# Patient Record
Sex: Female | Born: 1985 | Race: Black or African American | Hispanic: No | Marital: Married | Smoking: Current every day smoker
Health system: Southern US, Community
[De-identification: ages and names within clinical notes are randomized; demographics above are authoritative.]

## PROBLEM LIST (undated history)

## (undated) DIAGNOSIS — I517 Cardiomegaly: Secondary | ICD-10-CM

## (undated) DIAGNOSIS — R000 Tachycardia, unspecified: Secondary | ICD-10-CM

## (undated) DIAGNOSIS — N870 Mild cervical dysplasia: Secondary | ICD-10-CM

## (undated) DIAGNOSIS — N200 Calculus of kidney: Secondary | ICD-10-CM

## (undated) DIAGNOSIS — G4733 Obstructive sleep apnea (adult) (pediatric): Secondary | ICD-10-CM

## (undated) DIAGNOSIS — F419 Anxiety disorder, unspecified: Secondary | ICD-10-CM

## (undated) DIAGNOSIS — F319 Bipolar disorder, unspecified: Secondary | ICD-10-CM

## (undated) DIAGNOSIS — E119 Type 2 diabetes mellitus without complications: Secondary | ICD-10-CM

## (undated) DIAGNOSIS — I509 Heart failure, unspecified: Secondary | ICD-10-CM

## (undated) HISTORY — DX: Calculus of kidney: N20.0

## (undated) HISTORY — DX: Obstructive sleep apnea (adult) (pediatric): G47.33

## (undated) HISTORY — DX: Bipolar disorder, unspecified: F31.9

## (undated) HISTORY — DX: Mild cervical dysplasia: N87.0

## (undated) HISTORY — DX: Tachycardia, unspecified: R00.0

## (undated) HISTORY — DX: Anxiety disorder, unspecified: F41.9

---
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
## 2007-01-07 ENCOUNTER — Emergency Department (HOSPITAL_COMMUNITY): Admission: EM | Admit: 2007-01-07 | Discharge: 2007-01-07 | Payer: Self-pay | Admitting: Emergency Medicine

## 2007-01-07 IMAGING — CR DG CHEST 2V
2 series · 2 of 2 positions shown · non-contrast
Comparison: [DATE].

CLINICAL DATA: Chest pain for 1 week with nausea and vomiting.  
 CHEST - 2 VIEW ? [DATE]:

[view not recorded (1 of 2)]
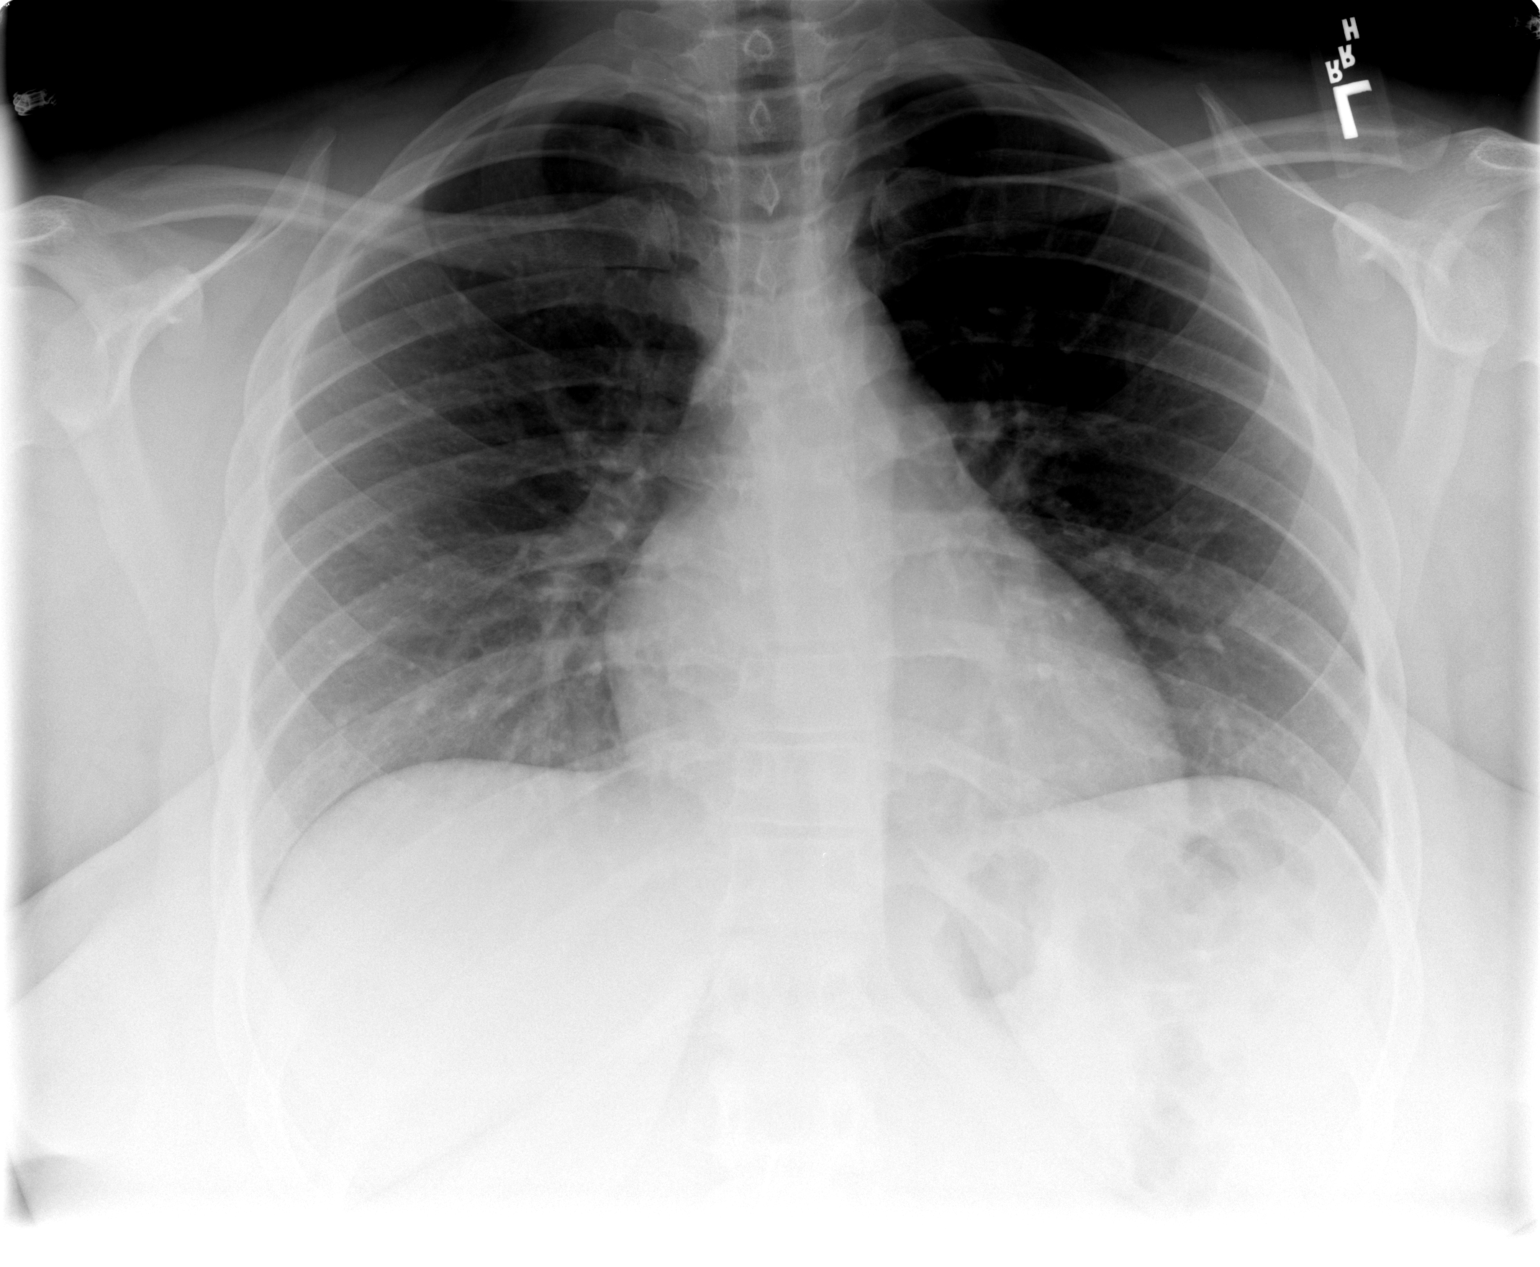

[view not recorded (2 of 2)]
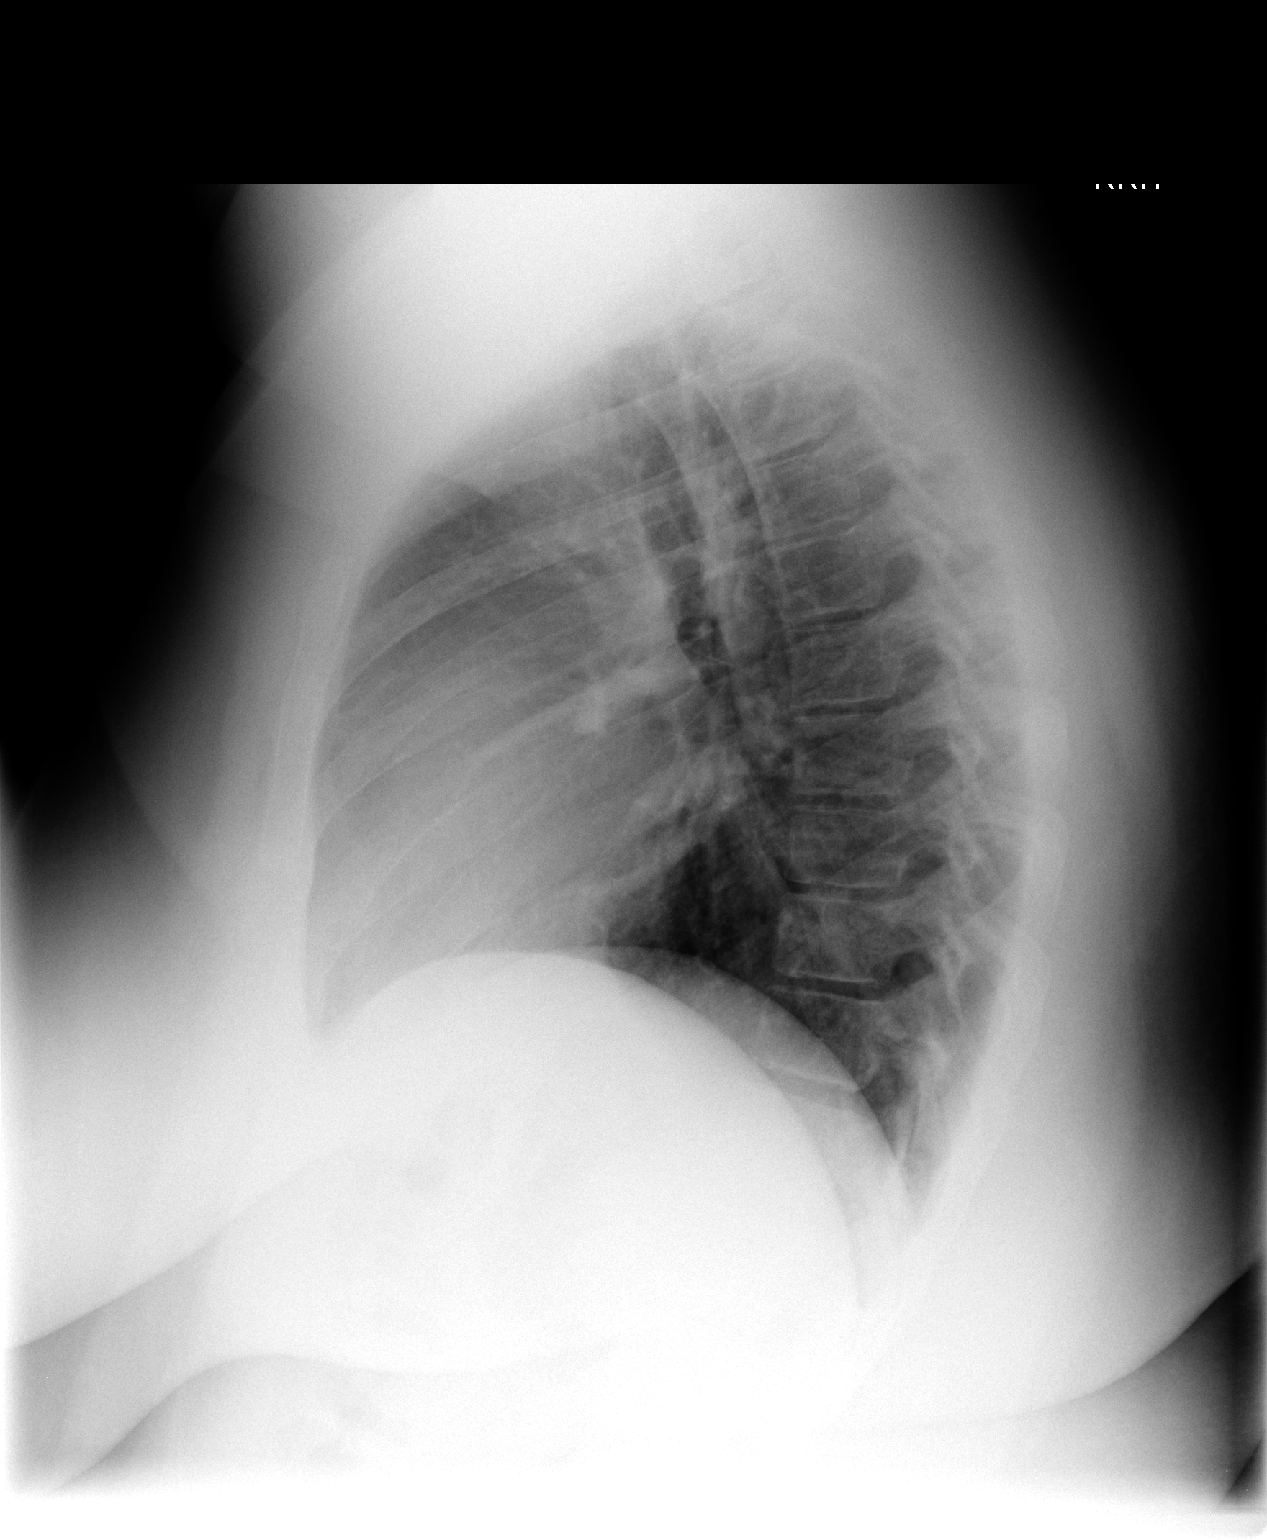

[2 of 2 positions shown; findings below may reference images not displayed]

FINDINGS: The trachea is midline.  The heart size is normal.  The lungs are clear.   No pleural fluid.
IMPRESSION: No acute findings.

## 2007-03-24 ENCOUNTER — Emergency Department (HOSPITAL_COMMUNITY): Admission: EM | Admit: 2007-03-24 | Discharge: 2007-03-24 | Payer: Self-pay | Admitting: Emergency Medicine

## 2007-06-06 ENCOUNTER — Emergency Department (HOSPITAL_COMMUNITY): Admission: EM | Admit: 2007-06-06 | Discharge: 2007-06-06 | Payer: Self-pay | Admitting: Emergency Medicine

## 2008-03-07 ENCOUNTER — Emergency Department (HOSPITAL_COMMUNITY): Admission: EM | Admit: 2008-03-07 | Discharge: 2008-03-07 | Payer: Self-pay | Admitting: Emergency Medicine

## 2008-03-07 IMAGING — CT CT ABDOMEN W/O CM
1 of 2 series · 14 of 32 positions shown, 18 images · non-contrast
Comparison: [DATE].

CT ABDOMEN

CLINICAL DATA: 21-year-old female with right flank pain, dysuria
and vomiting.

CT ABDOMEN AND PELVIS WITHOUT CONTRAST
TECHNIQUE: Multidetector CT imaging of the abdomen and pelvis was
performed following the standard
protocol without intravenous contrast.

[Series 2: stone over (id) 5.0 b40f · axial · 0.85mm/px · z∈[-426,+14]mm · 14 of 98 slices shown, 18 images]
[im 5/98  soft-tissue]
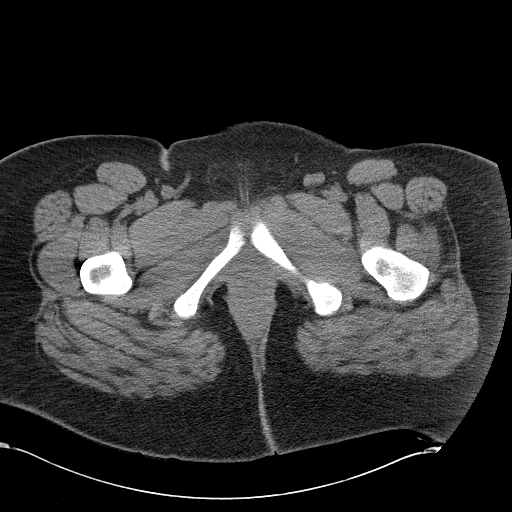
[im 5/98  bone]
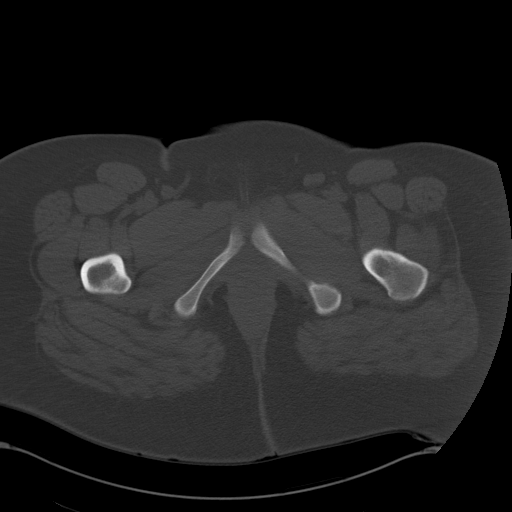
[im 13/98  soft-tissue]
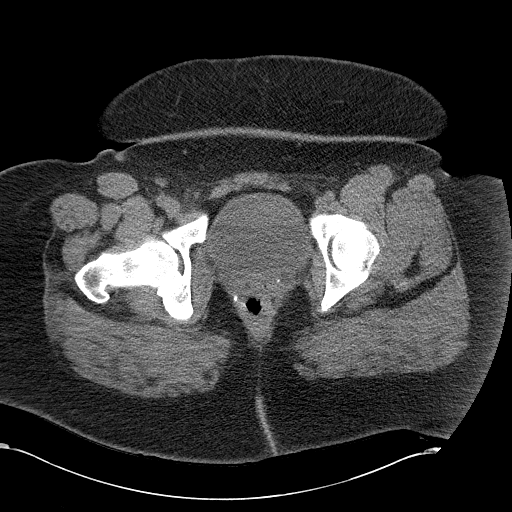
[im 21/98  soft-tissue]
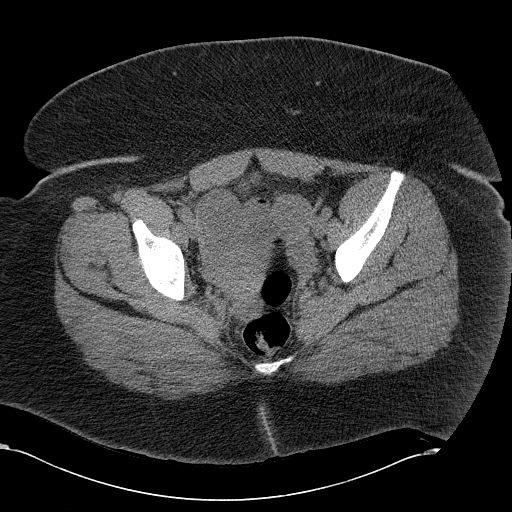
[im 29/98  soft-tissue]
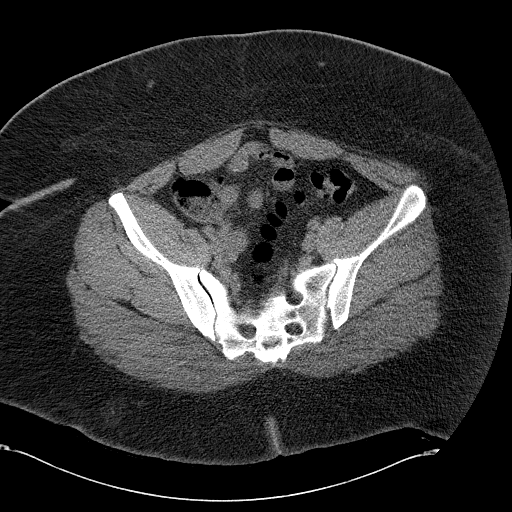
[im 37/98  soft-tissue]
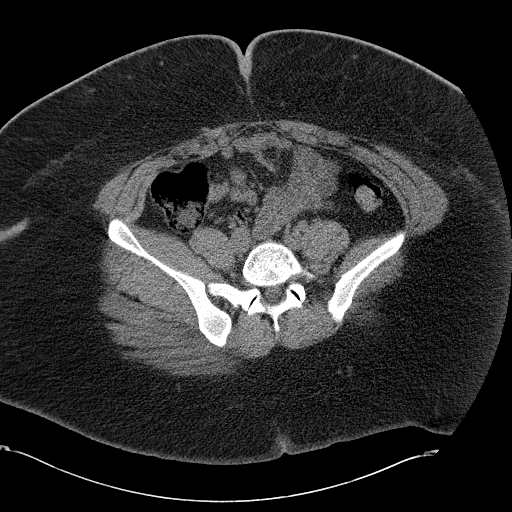
[im 45/98  soft-tissue]
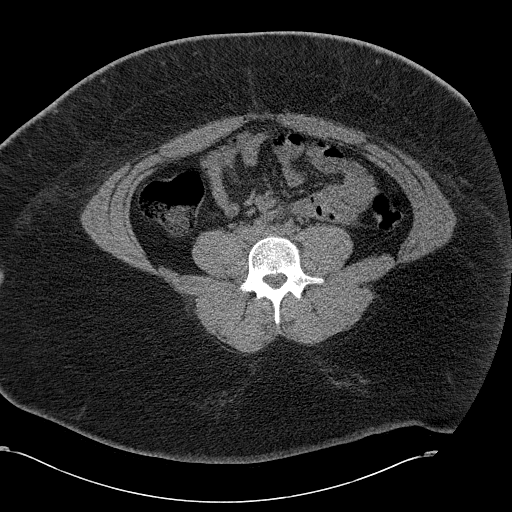
[im 53/98  soft-tissue]
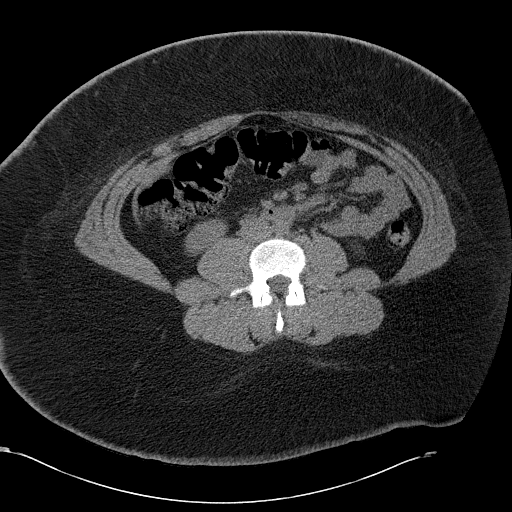
[im 61/98  soft-tissue]
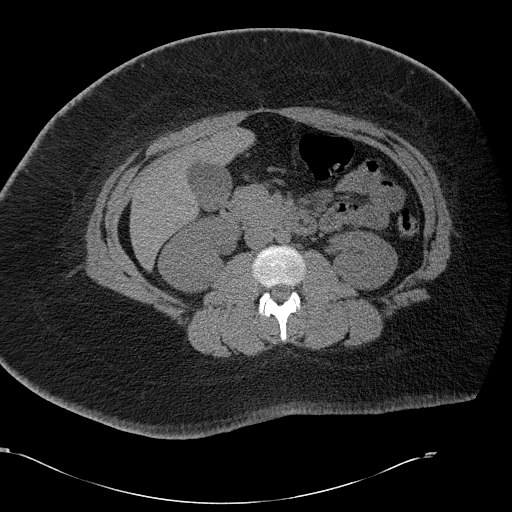
[im 69/98  soft-tissue]
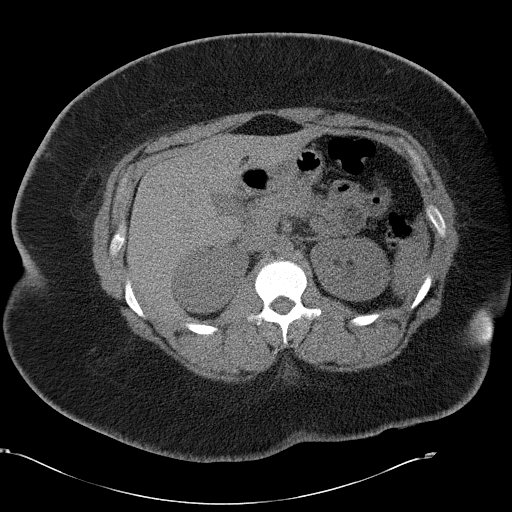
[im 69/98  bone]
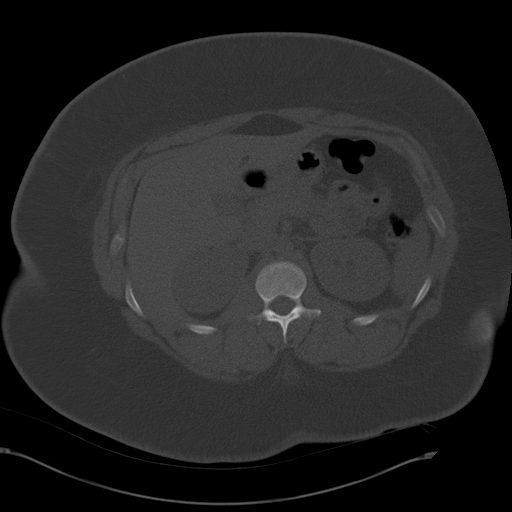
[im 77/98  soft-tissue]
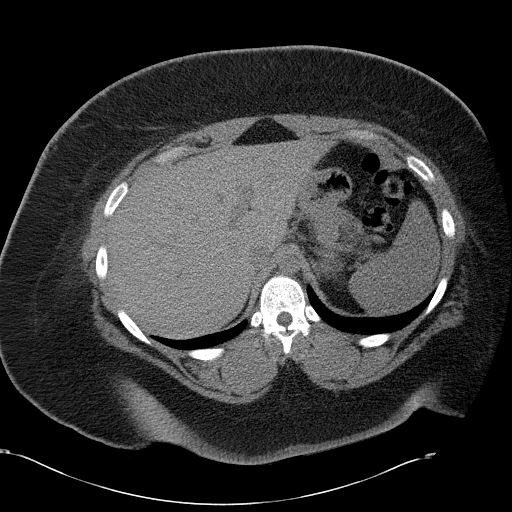
[im 81/98  lung]
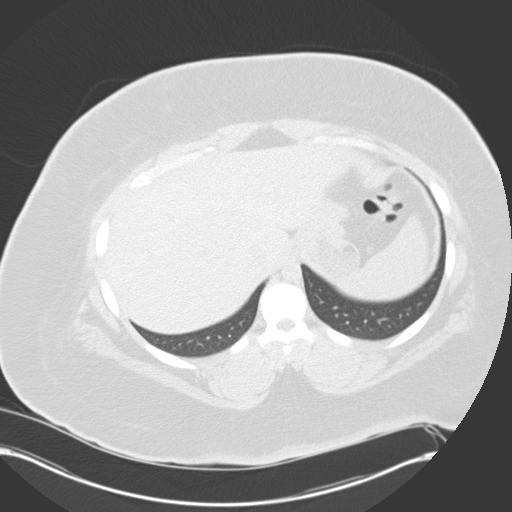
[im 85/98  soft-tissue]
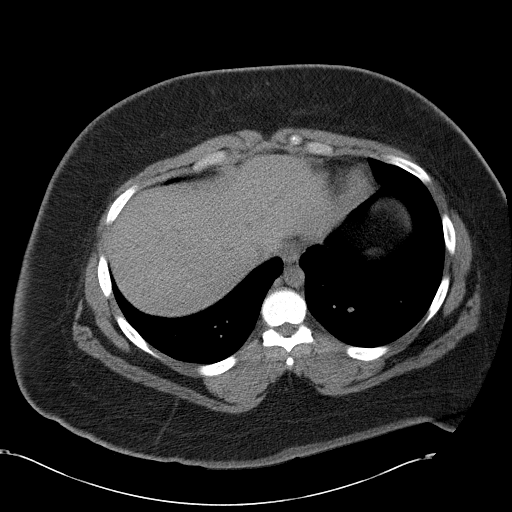
[im 85/98  lung]
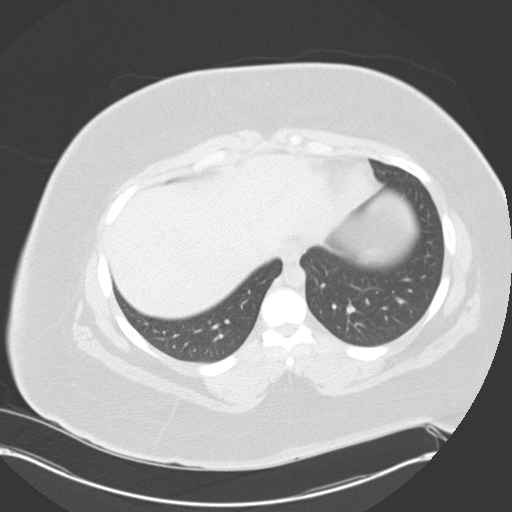
[im 89/98  lung]
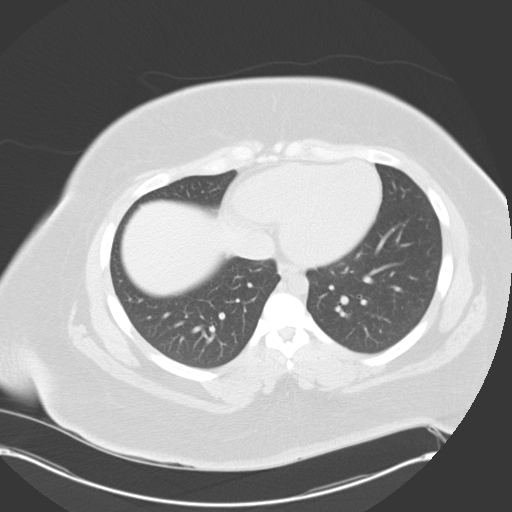
[im 93/98  soft-tissue]
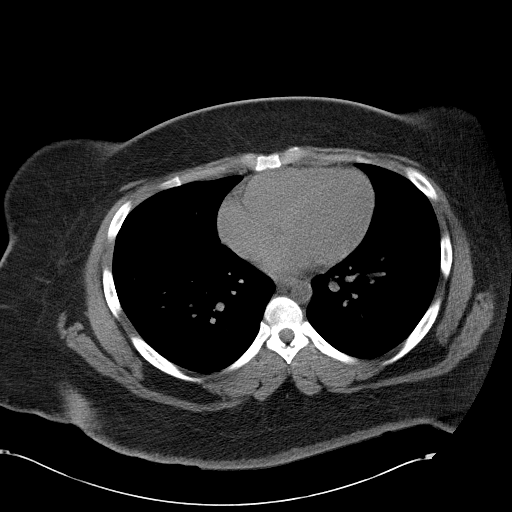
[im 93/98  lung]
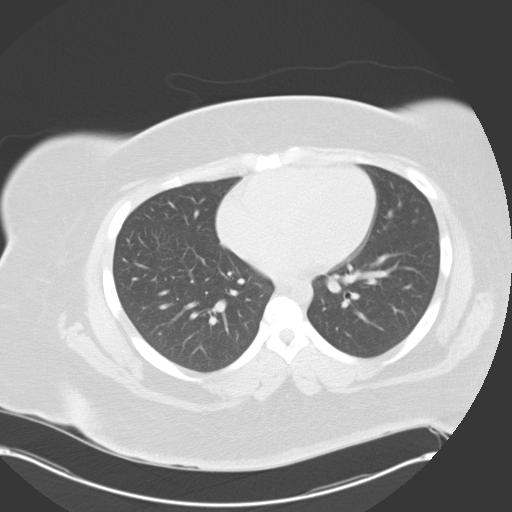

[14 of 32 positions shown; findings below may reference images not displayed]

FINDINGS: Visualized lung bases are clear.  No acute osseous
abnormality.  Facet degeneration at L5-S1 with vacuum facet
phenomenon.  Visualized noncontrasted liver, gallbladder, pancreas,
adrenal glands, stomach, duodenum and proximal small bowel loops
are within normal limits.  Visualized distal small bowel and
visualized large bowel loops are within normal limits.  The
appendix is gas filled and non inflamed.

The left kidney appears nonobstructed without perinephric or
periureteric stranding.  The proximal ureter is decompressed.
There is no and left nephrolithiasis.

The right renal sinus fat is not as easily visualized, such that
mild right hydronephrosis cannot be excluded.  There is no peri
nephric or periureteric stranding and the proximal right ureter is
decompressed.  No right nephrolithiasis.
IMPRESSION: 1.  Mild right hydronephrosis cannot be excluded, but otherwise
there is no strong evidence of acute obstructive uropathy.  See
pelvis findings.
2.  Otherwise no acute or inflammatory findings in the abdomen.

CT PELVIS
FINDINGS: Visualized distal large bowel and small bowel loops are
within normal limits.  No free fluid evident.  Cystic enlargement
of the adnexa right greater than left is noted, previously the left
adnexa was larger.  Noncontrast appearance of the uterus is within
normal limits, as before is slightly deviated to the right.

The bladder is within normal limits.  There are two calcific foci
in the right hemi pelvis which are unchanged, and therefore most
suggestive of phleboliths.  Two additional small phleboliths in the
left perimetrial tissues appear new.  No findings suspicious for
obstructing calculus in the distal right ureter.  No acute osseous
abnormality.
IMPRESSION: 1.  Probable chronic right pelvic phleboliths, unchanged since
[RI].  No strong evidence of obstructing distal right ureteral
calculus.  If there is strong suspicion of obstructive uropathy,
ultrasound of the bladder with Doppler to document normal ureteral
jets could be performed.
2.  Right greater than left cystic enlargement of the adnexa,
likely physiologic given this patient's age.

## 2008-06-30 ENCOUNTER — Emergency Department (HOSPITAL_COMMUNITY): Admission: EM | Admit: 2008-06-30 | Discharge: 2008-06-30 | Payer: Self-pay | Admitting: Emergency Medicine

## 2008-08-02 ENCOUNTER — Emergency Department (HOSPITAL_COMMUNITY): Admission: EM | Admit: 2008-08-02 | Discharge: 2008-08-03 | Payer: Self-pay | Admitting: Emergency Medicine

## 2008-08-02 IMAGING — CR DG HIP (WITH OR WITHOUT PELVIS) 2-3V*L*
3 series · 3 of 3 positions shown · non-contrast
Comparison: None.

CLINICAL DATA: Left hip pain.  No recent injuries.

LEFT HIP - COMPLETE 2+ VIEW [DATE]:

[view not recorded (1 of 3)]
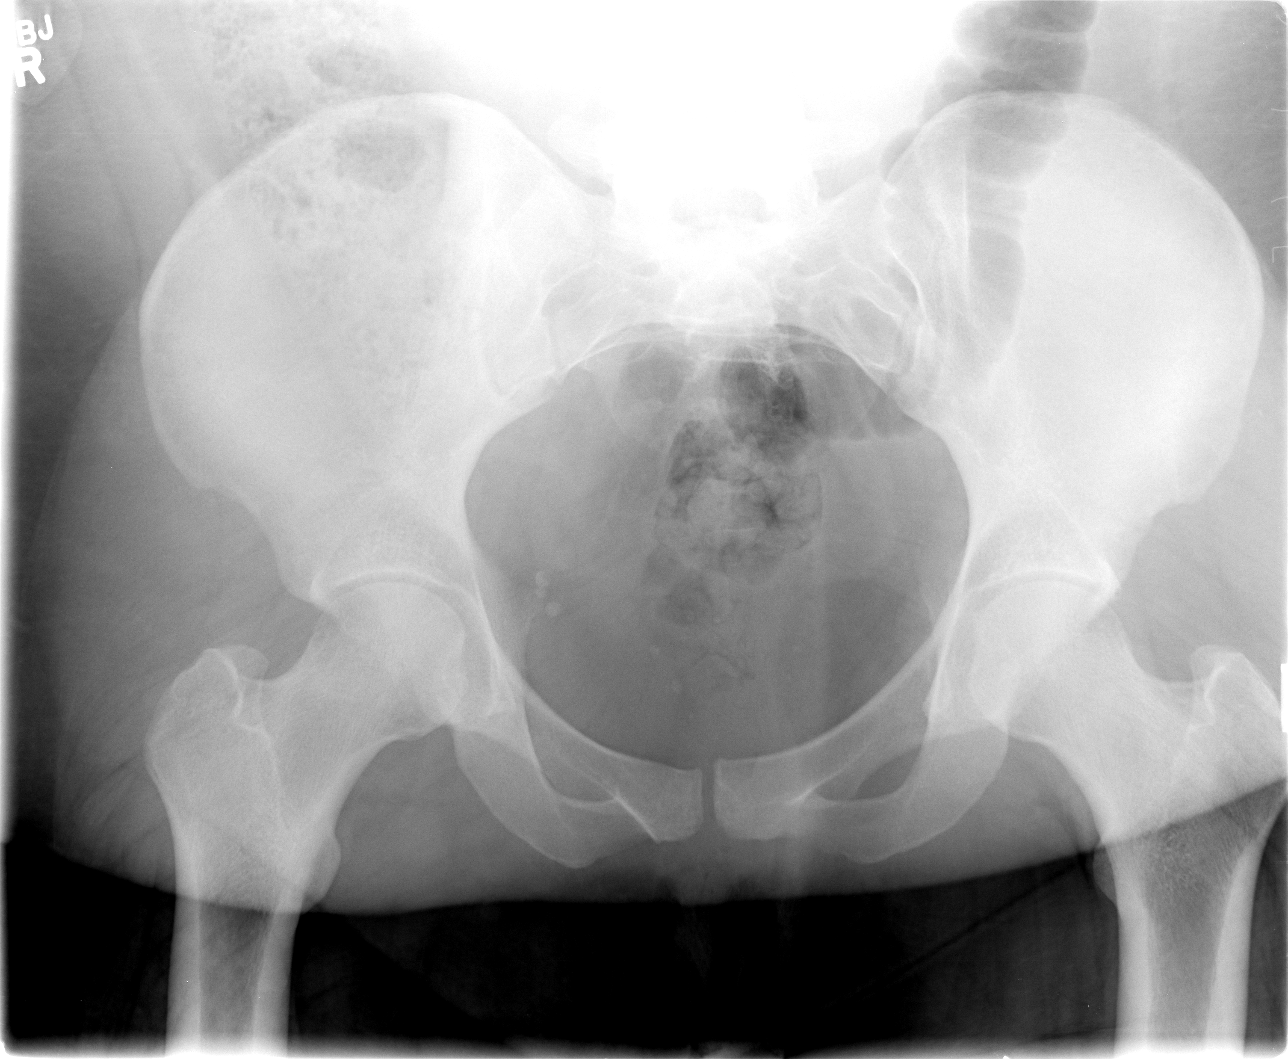

[view not recorded (2 of 3)]
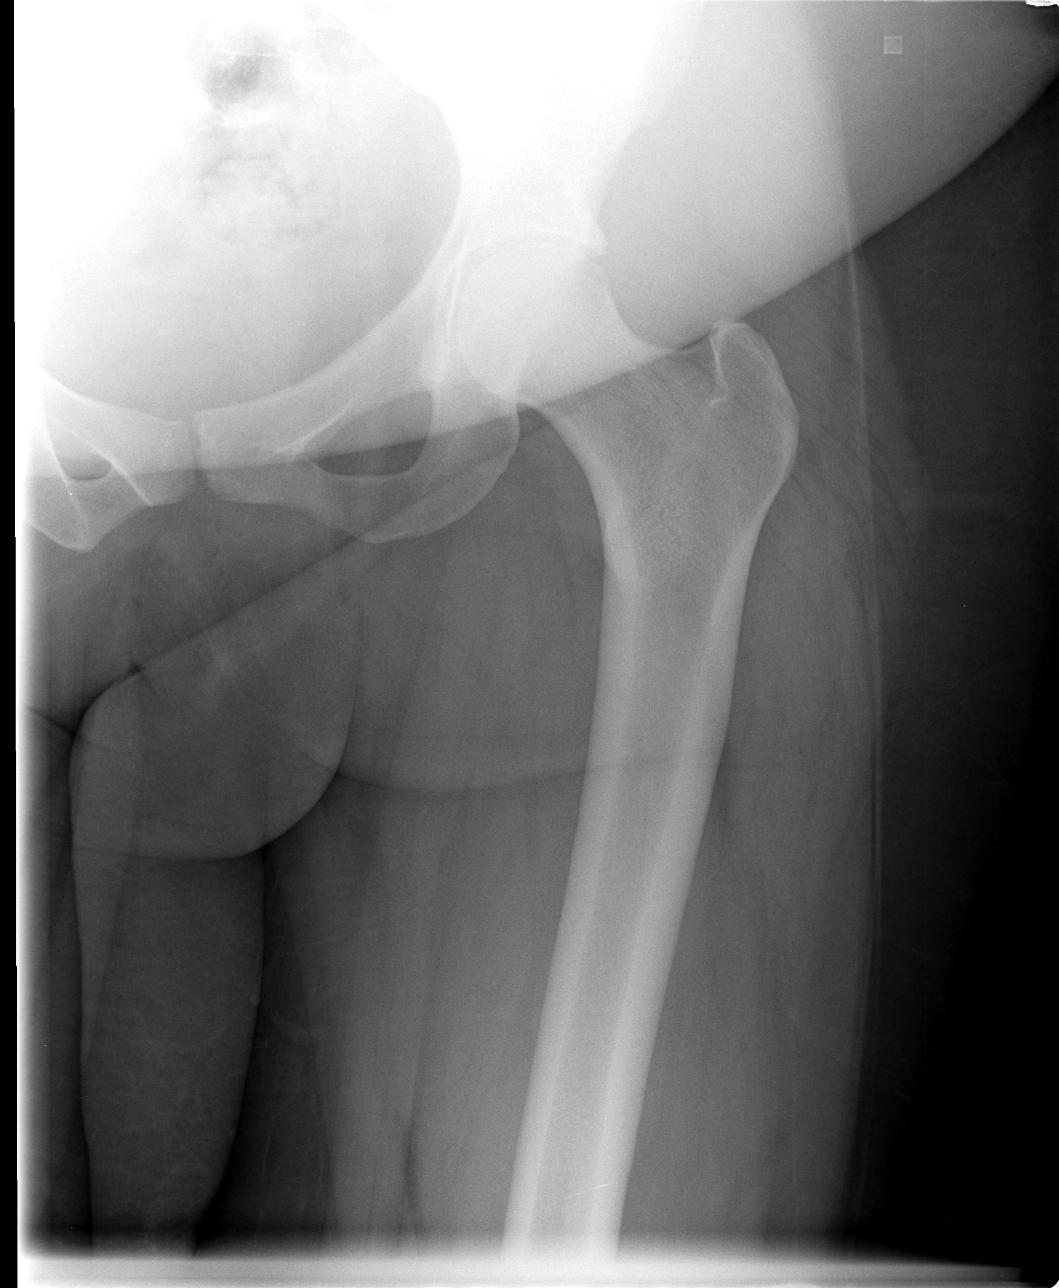

[view not recorded (3 of 3)]
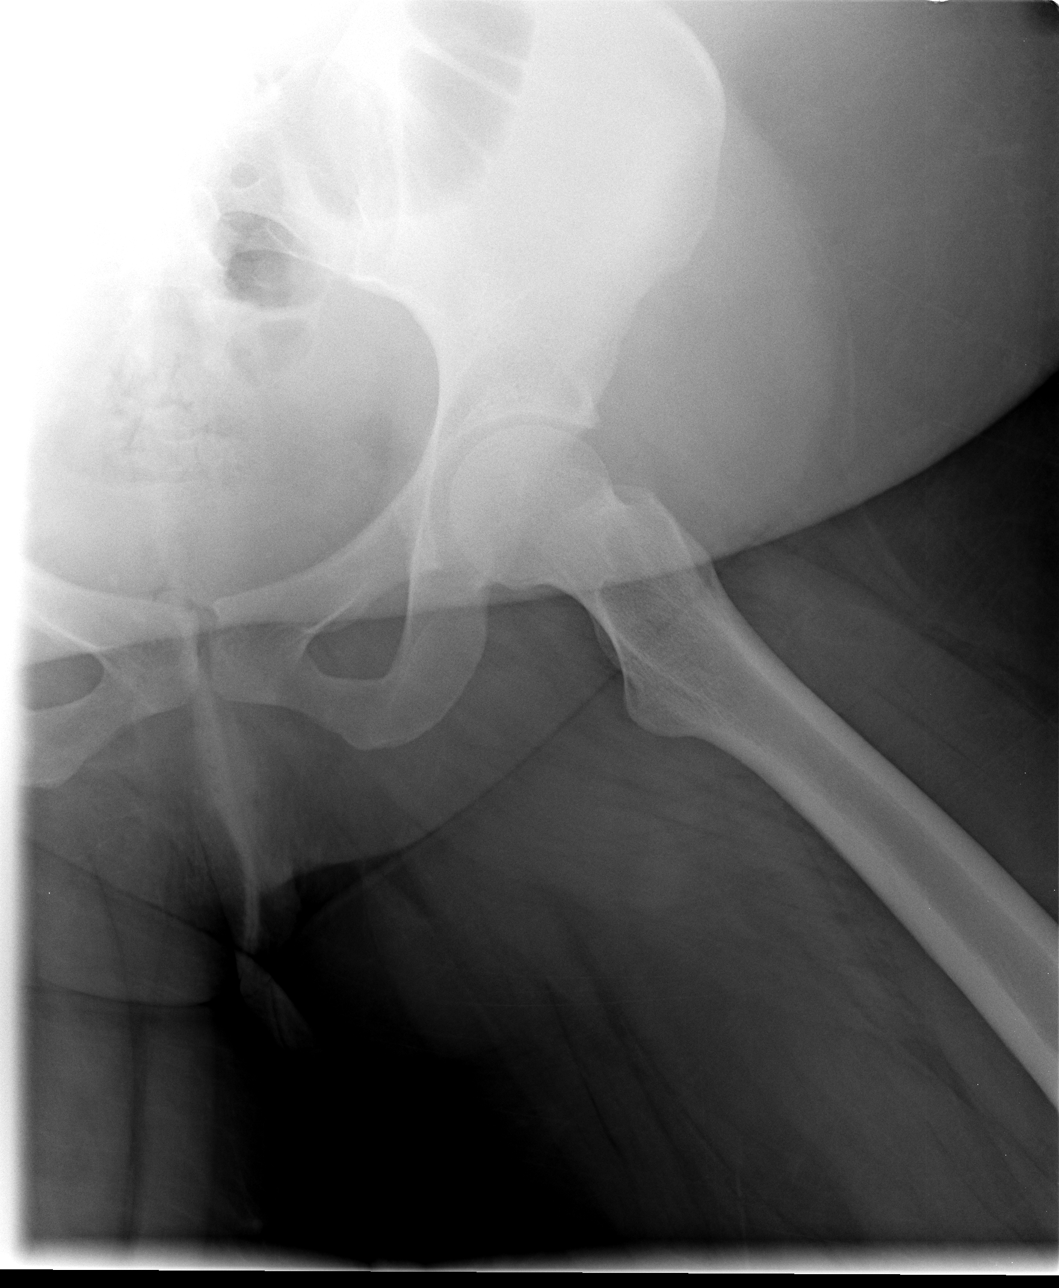

[3 of 3 positions shown; findings below may reference images not displayed]

FINDINGS: No evidence of acute, subacute, or healed fractures.
Joint space well-preserved.  No intrinsic osseous abnormalities.
No visible joint effusion.

Included AP pelvis demonstrates a normal appearing right hip.
Sacroiliac joints and symphysis pubis intact.  No fractures
elsewhere involving the bony pelvis.  Visualized lower lumbar spine
unremarkable.
IMPRESSION: Normal left hip.

## 2008-08-02 IMAGING — CR DG ANKLE COMPLETE 3+V*L*
3 series · 3 of 3 positions shown · non-contrast
Comparison: None.

CLINICAL DATA: Left ankle pain and swelling.  No recent injuries.

LEFT ANKLE COMPLETE - 3+ VIEW [DATE]:

[view not recorded (1 of 3)]
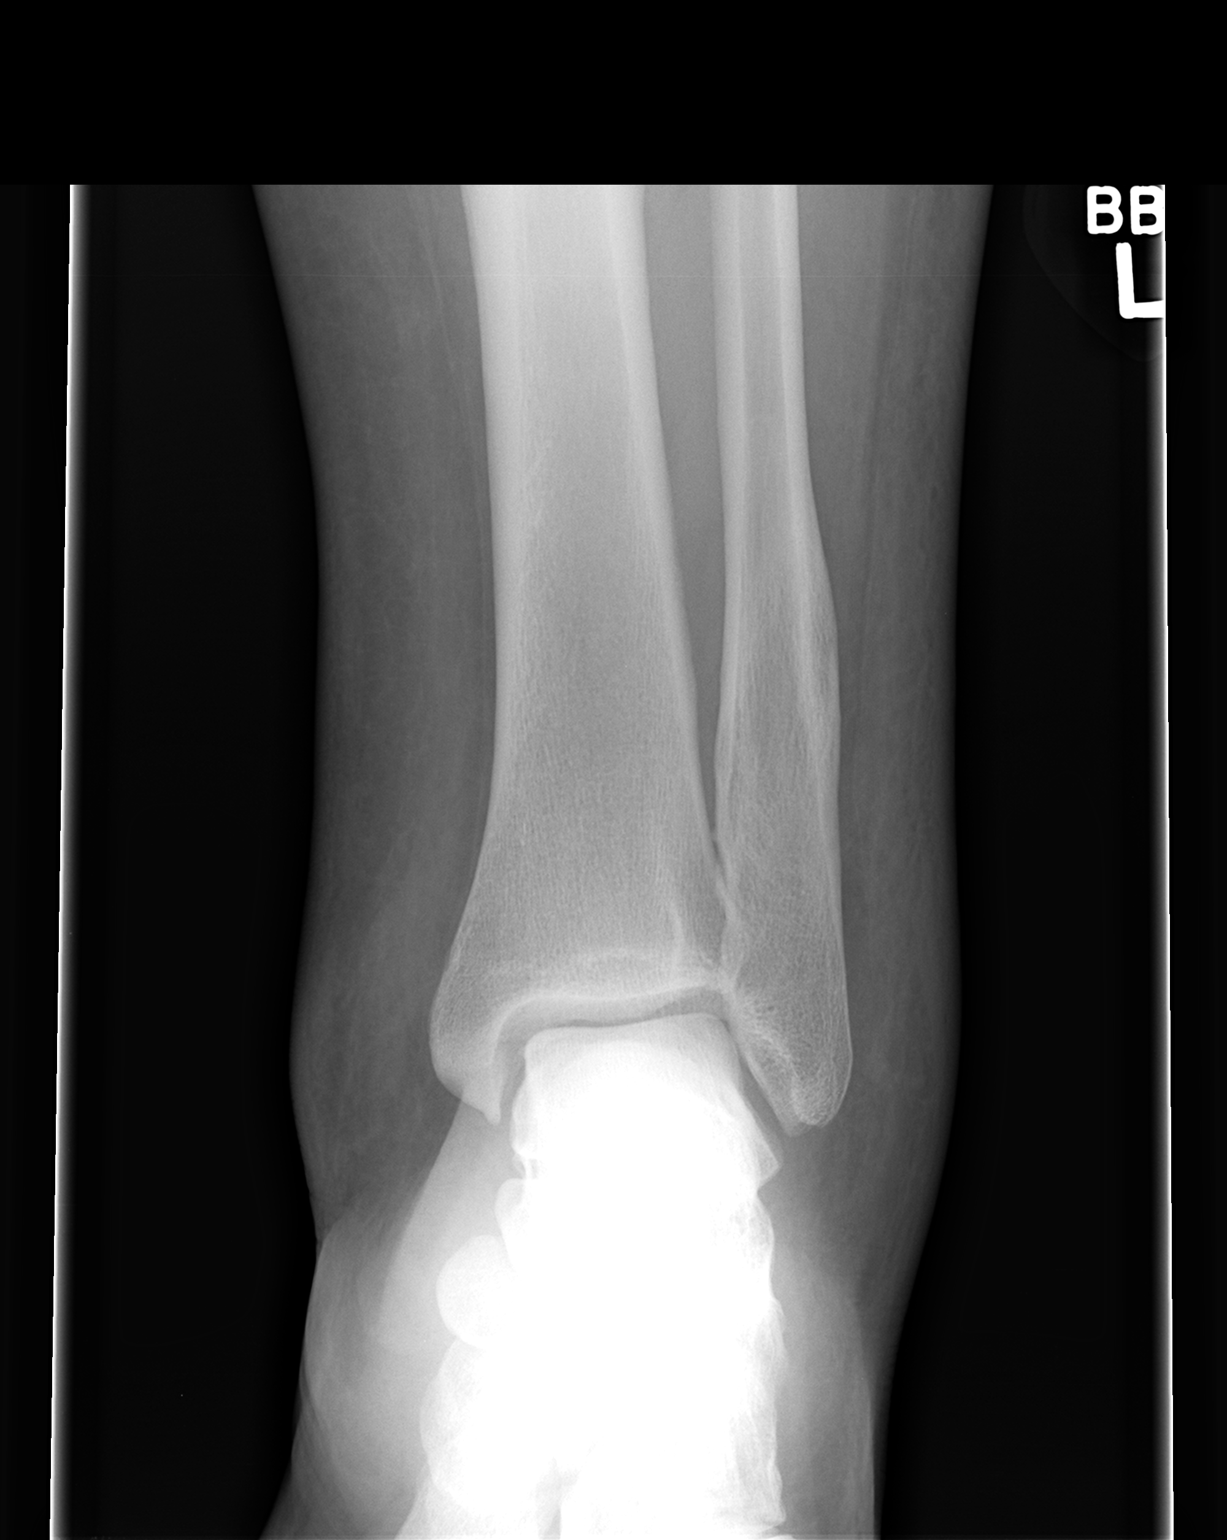

[view not recorded (2 of 3)]
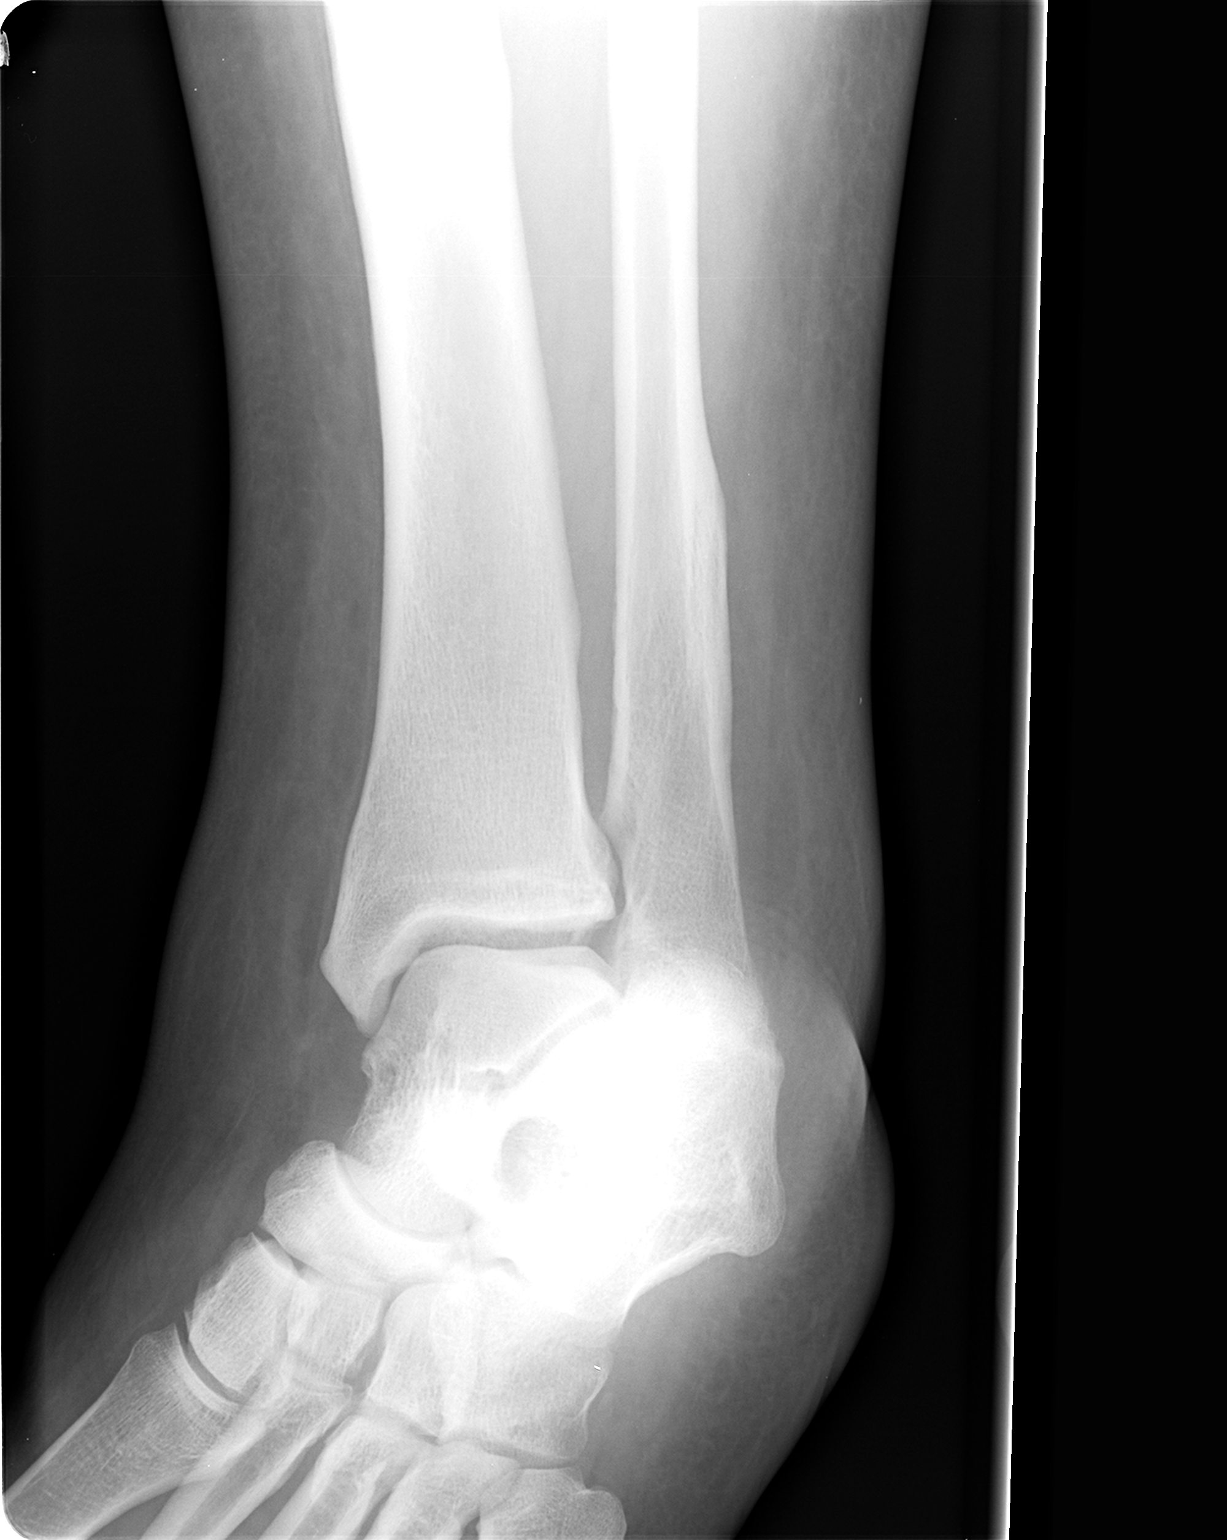

[view not recorded (3 of 3)]
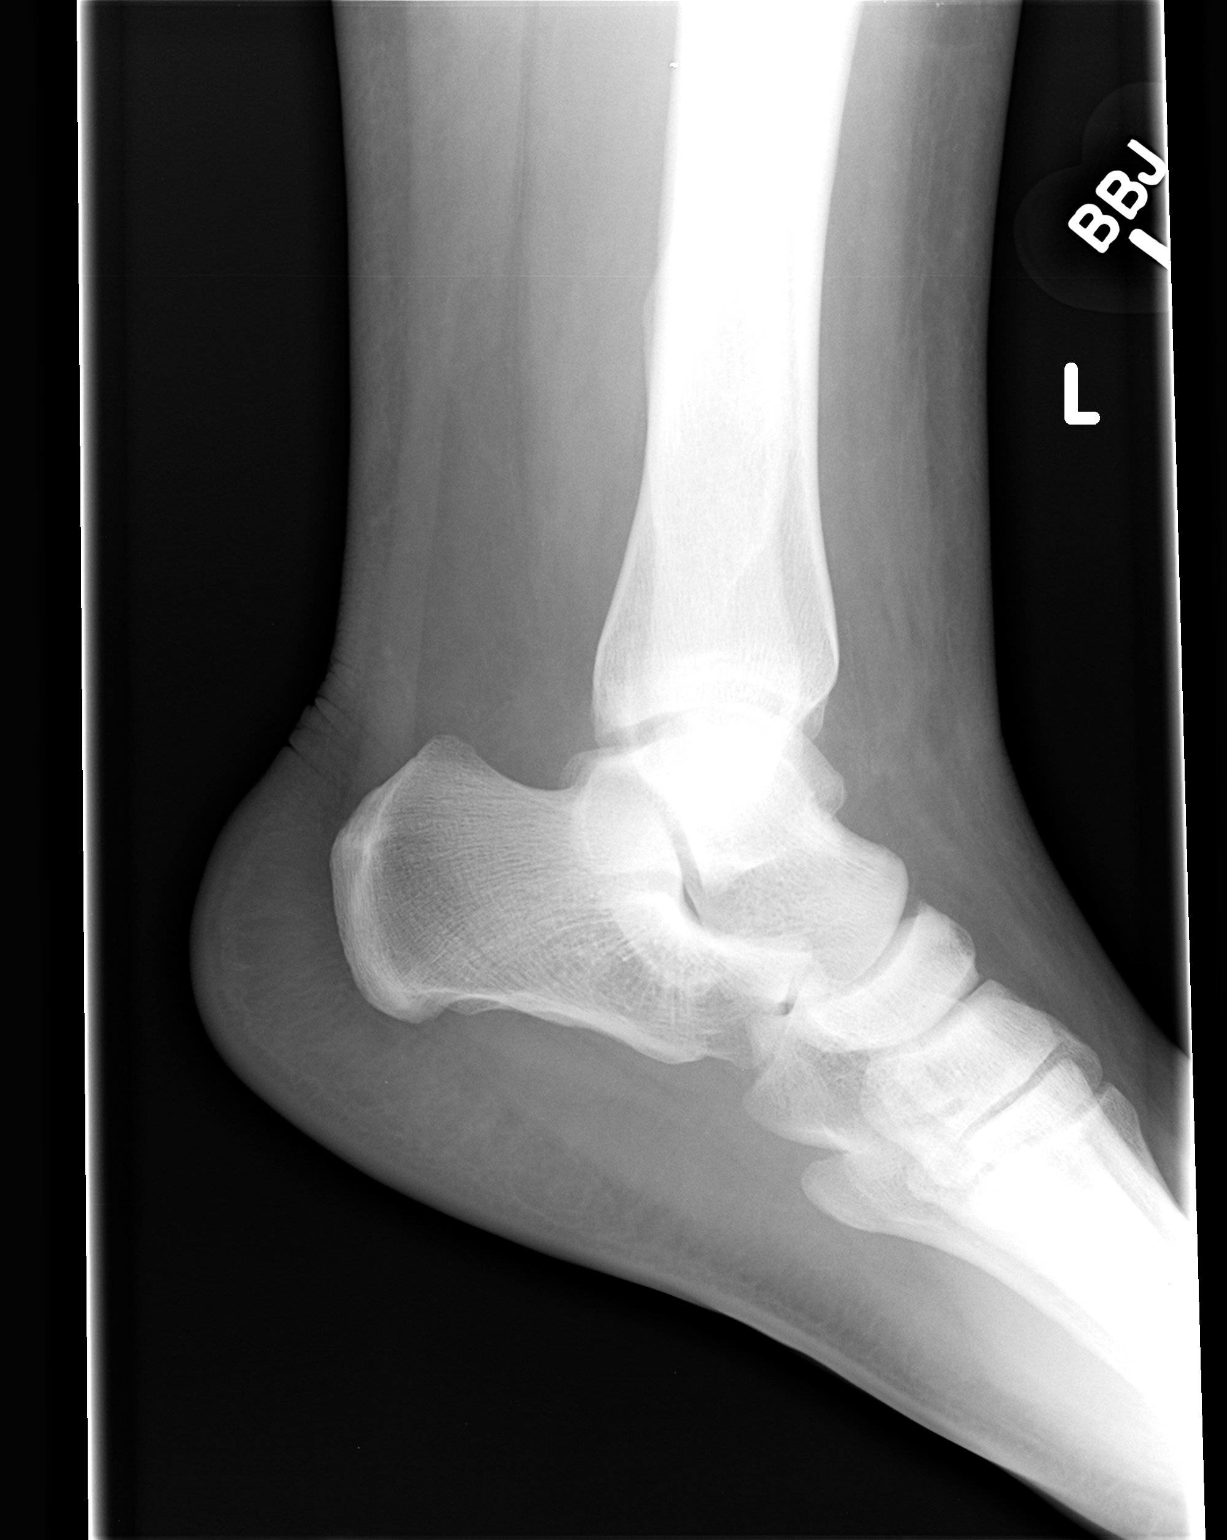

[3 of 3 positions shown; findings below may reference images not displayed]

FINDINGS: No evidence of acute fracture or dislocation.  Probable
old healed distal fibular fracture.  Ankle mortise intact with well-
preserved joint space.  Diffuse soft tissue swelling.
IMPRESSION: No acute skeletal abnormalities.

## 2009-04-10 ENCOUNTER — Inpatient Hospital Stay (HOSPITAL_COMMUNITY): Admission: EM | Admit: 2009-04-10 | Discharge: 2009-04-17 | Payer: Self-pay | Admitting: Emergency Medicine

## 2009-04-10 IMAGING — CR DG ABDOMEN ACUTE W/ 1V CHEST
4 series · 4 of 4 positions shown · non-contrast
Comparison: Chest x-rays [DATE].  Abdominal CT [DATE].

CLINICAL DATA: Abdominal pain.

ACUTE ABDOMEN SERIES (ABDOMEN 2 VIEW & CHEST 1 VIEW)

[view not recorded (1 of 4)]
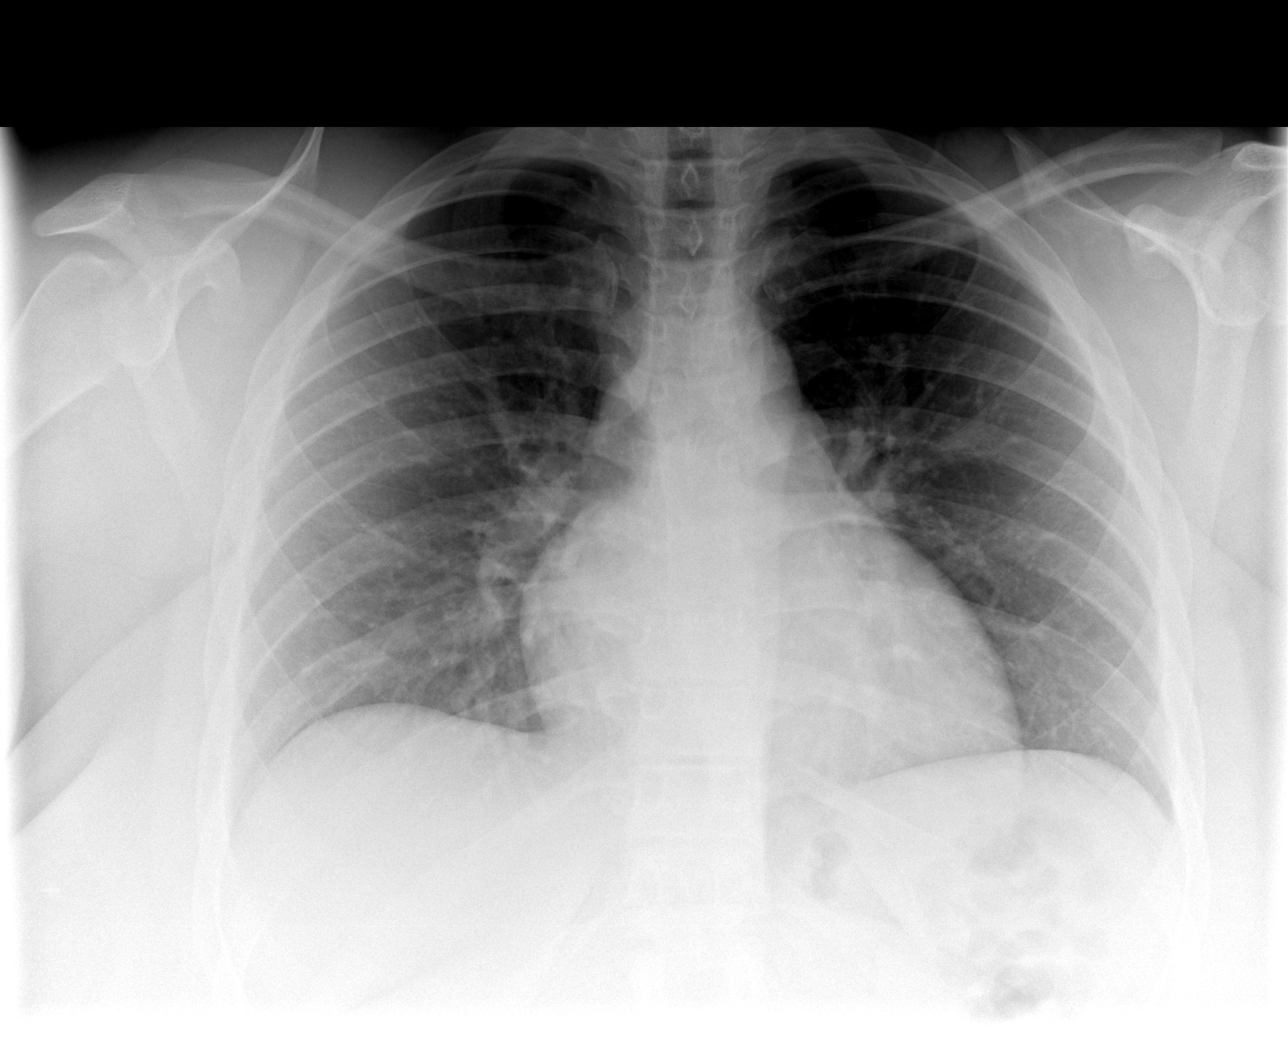

[view not recorded (2 of 4)]
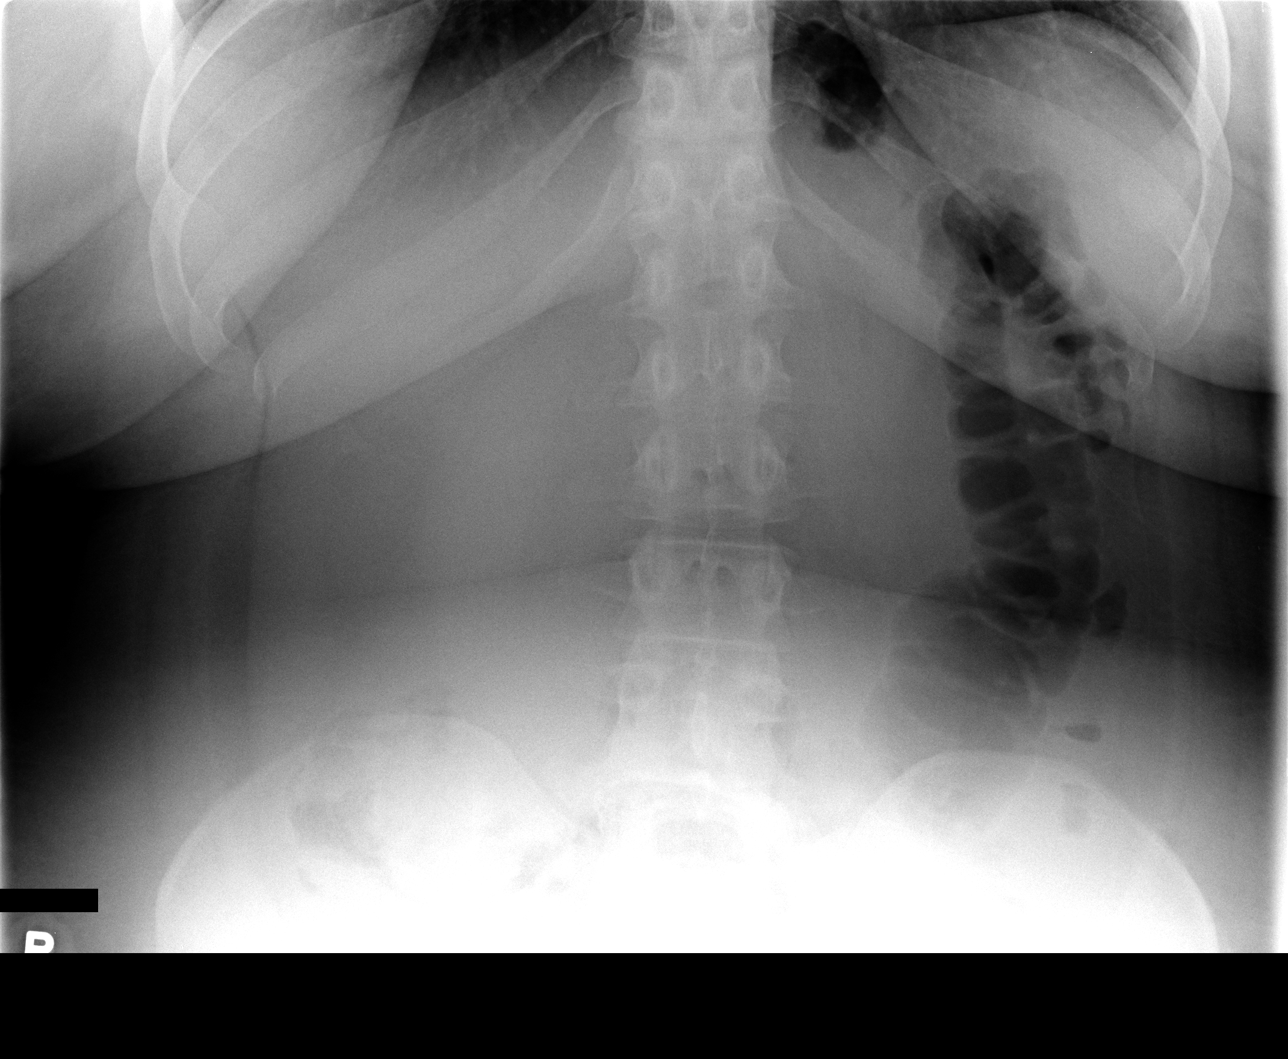

[view not recorded (3 of 4)]
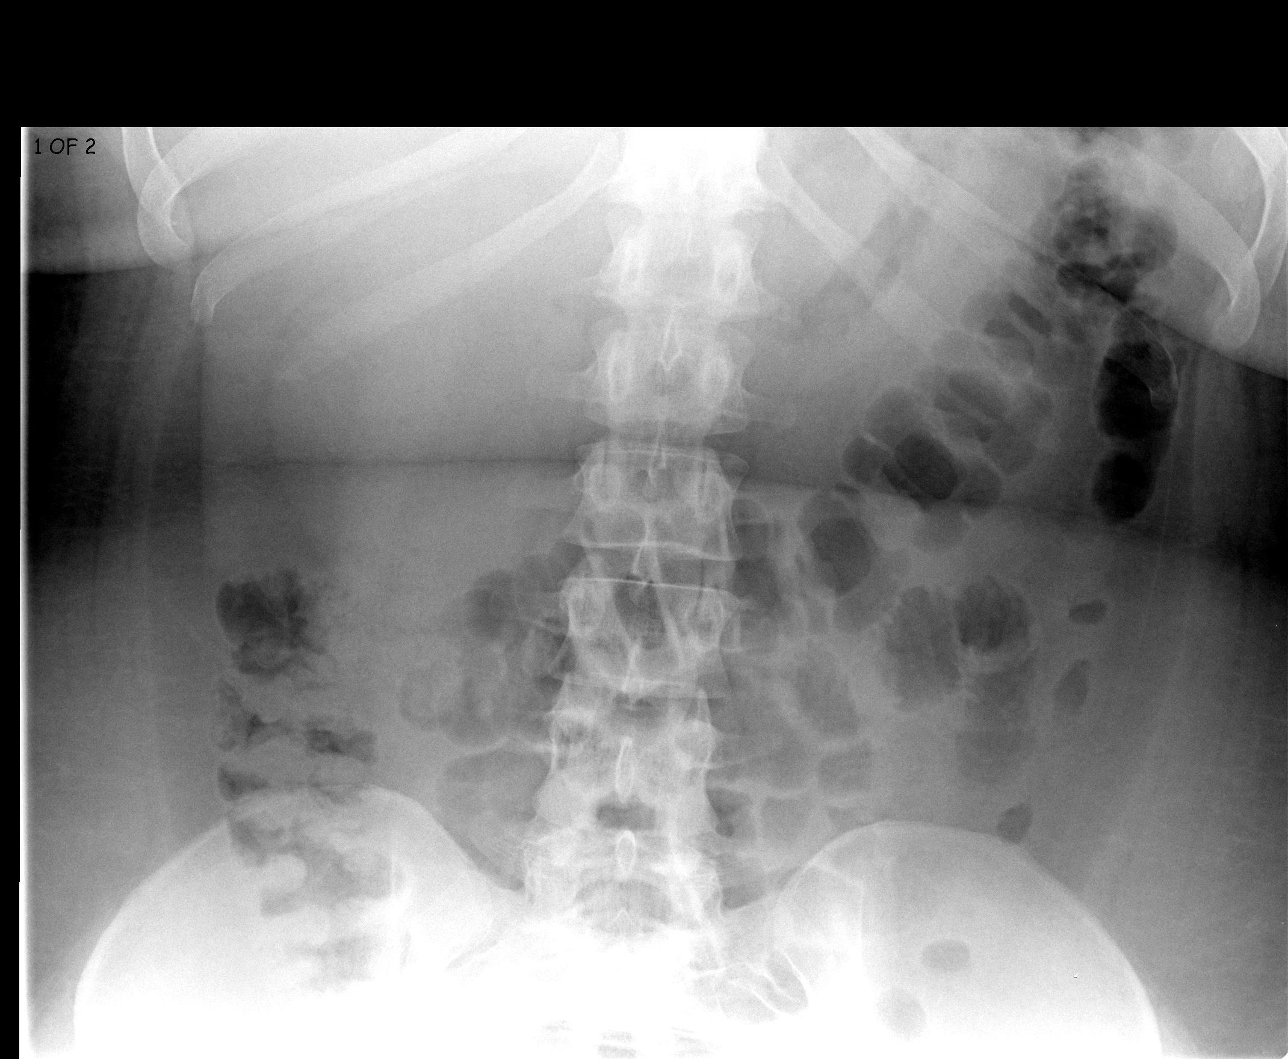

[view not recorded (4 of 4)]
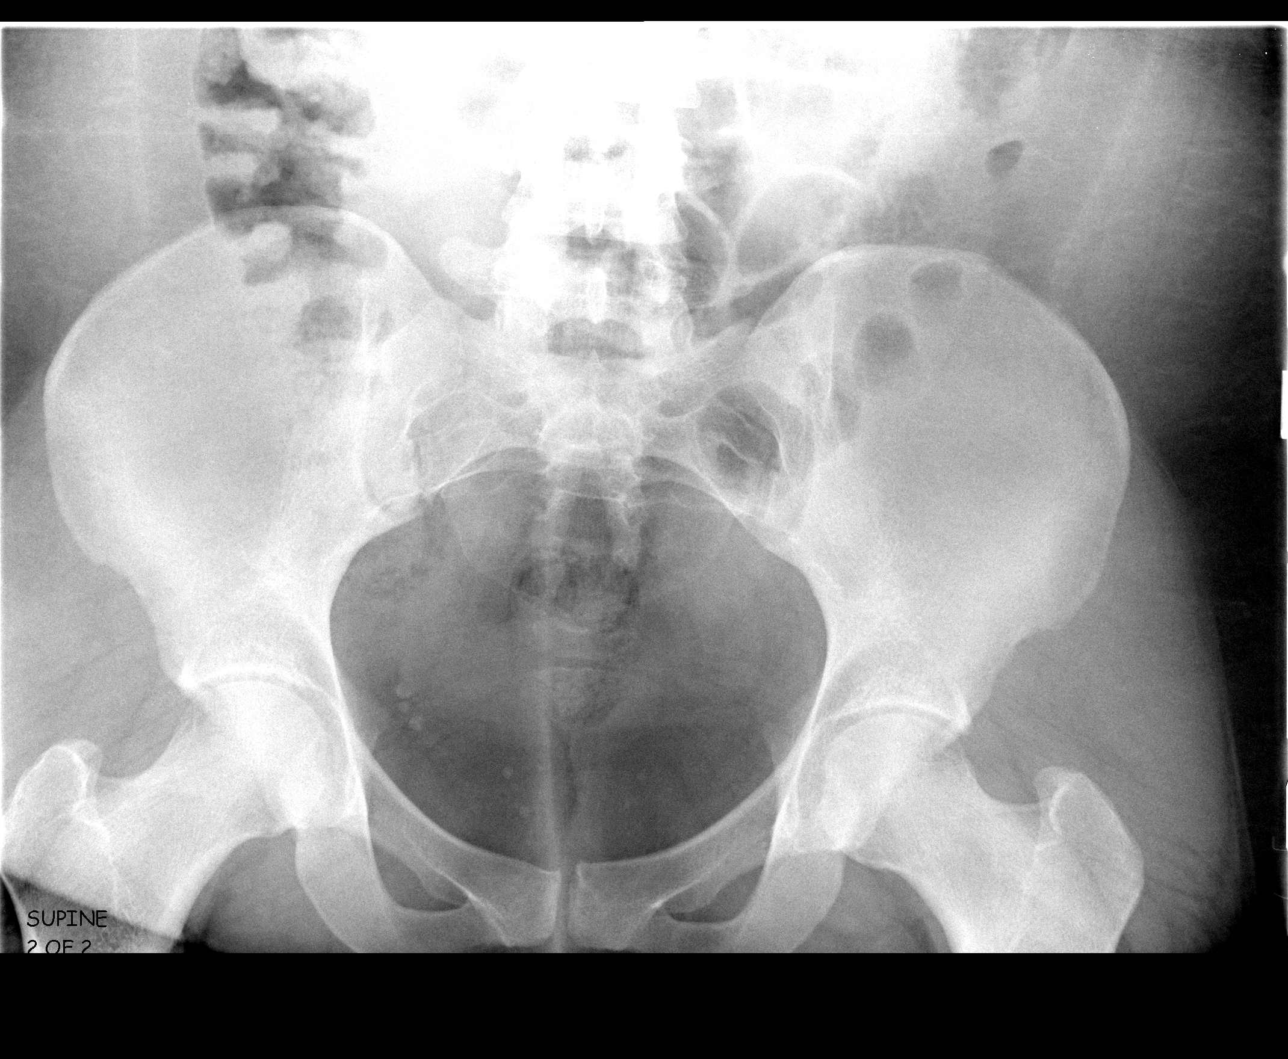

[4 of 4 positions shown; findings below may reference images not displayed]

FINDINGS: The cardiopericardial silhouette is within normal limits
for size.  The lungs are clear.  The visualized soft tissues and
bony thorax are unremarkable.

The bowel gas pattern is unremarkable.  There is no evidence for
obstruction or free air.  The axial skeleton is within normal
limits.
IMPRESSION: No acute abnormality.

## 2009-04-11 ENCOUNTER — Ambulatory Visit: Payer: Self-pay | Admitting: Gastroenterology

## 2009-04-11 IMAGING — CT CT ABDOMEN W/ CM
2 of 4 series · 16 of 46 positions shown, 18 images · IV contrast (agent unspecified)
Comparison: [DATE]

CLINICAL DATA: UTI, nausea, vomiting, epigastric pain, chills

CT ABDOMEN WITH CONTRAST
TECHNIQUE: Multidetector CT imaging of the abdomen was performed
using the standard protocol following bolus administration of
intravenous contrast.
Contrast: Dilute oral contrast and 120 ml [CA] IV

[Series 2: abd_pel_with 5.0 b40f · axial · 0.94mm/px · z∈[-331,-61]mm · 13 of 60 slices shown, 15 images]
[im 3/60  soft-tissue]
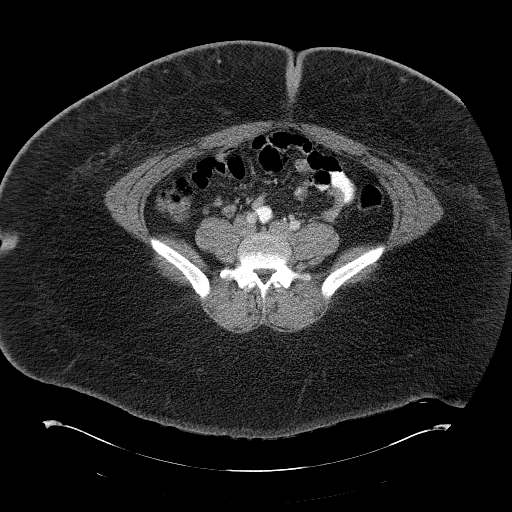
[im 3/60  bone]
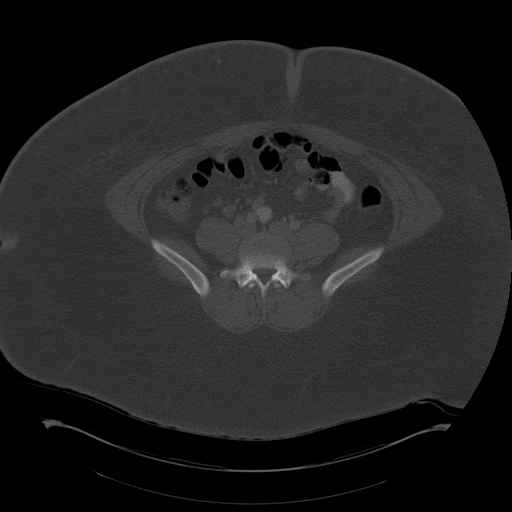
[im 8/60  soft-tissue]
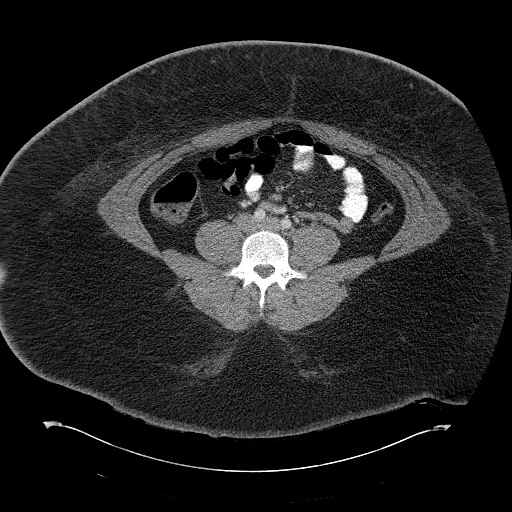
[im 13/60  soft-tissue]
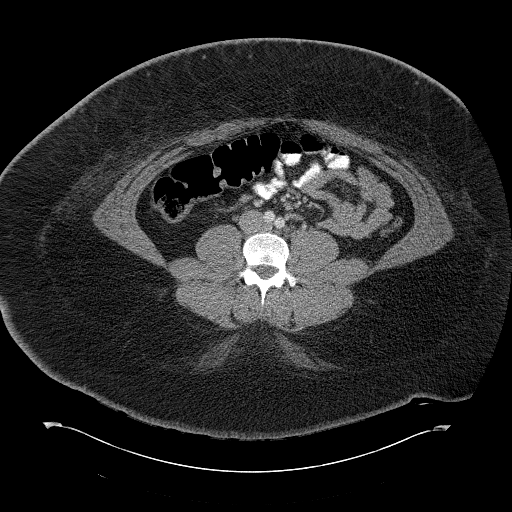
[im 16/60  soft-tissue]
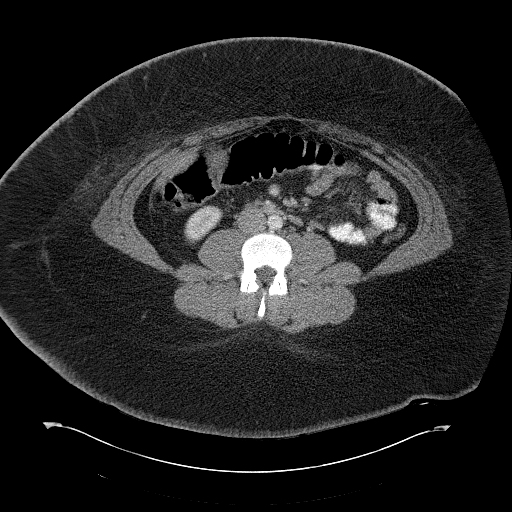
[im 21/60  soft-tissue]
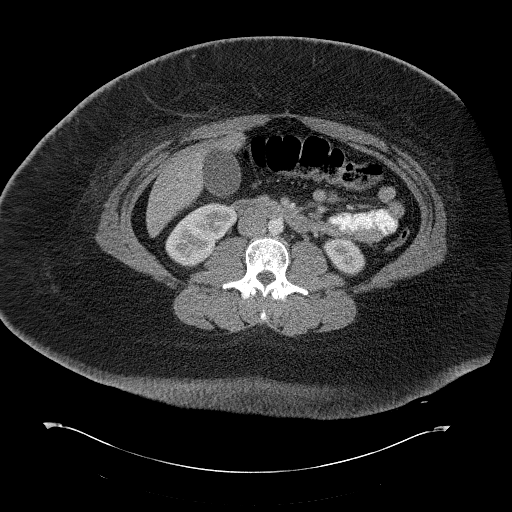
[im 26/60  soft-tissue]
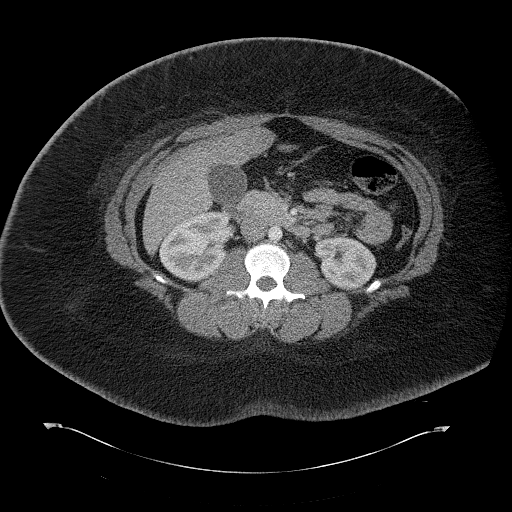
[im 31/60  soft-tissue]
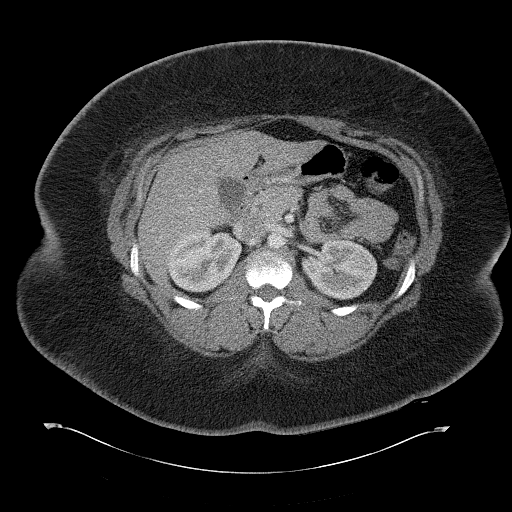
[im 34/60  soft-tissue]
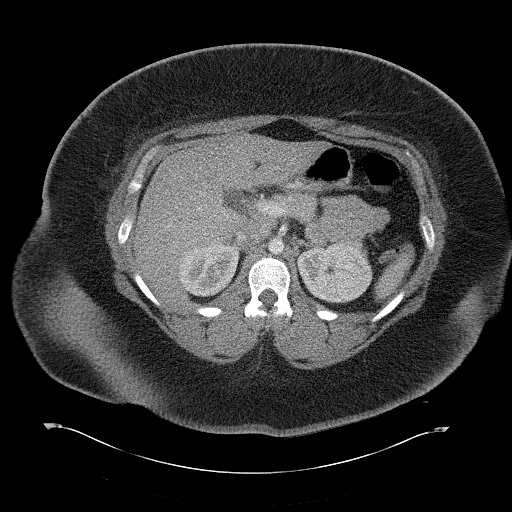
[im 39/60  soft-tissue]
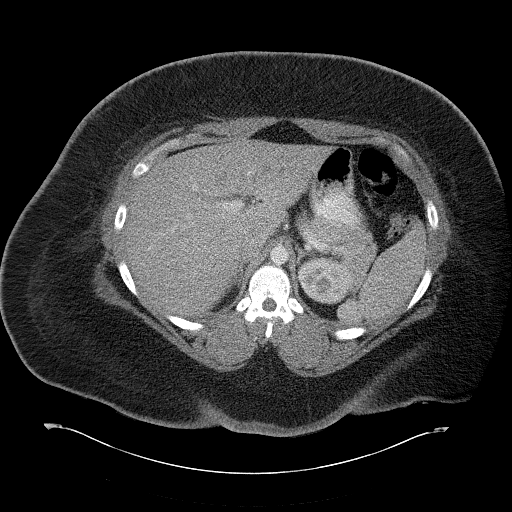
[im 39/60  bone]
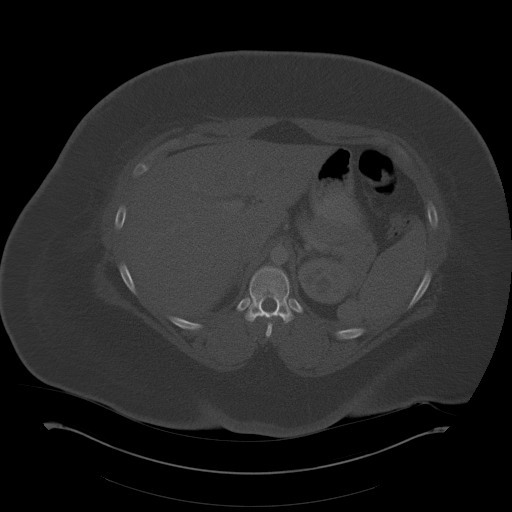
[im 44/60  soft-tissue]
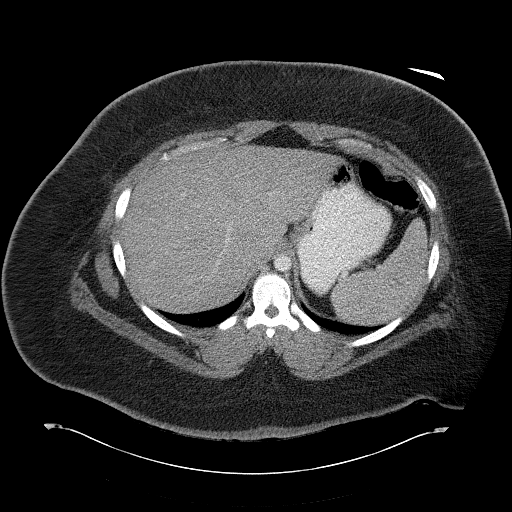
[im 47/60  soft-tissue]
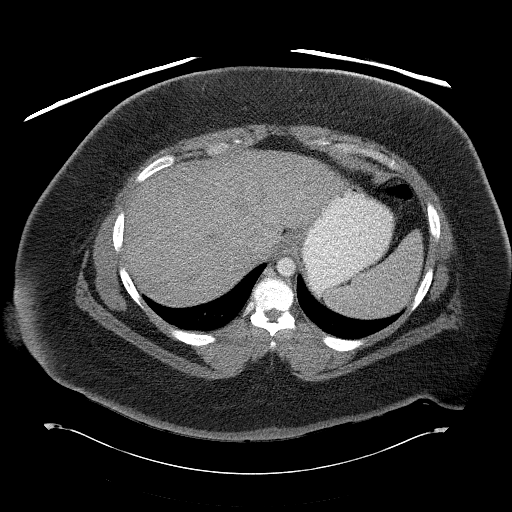
[im 52/60  soft-tissue]
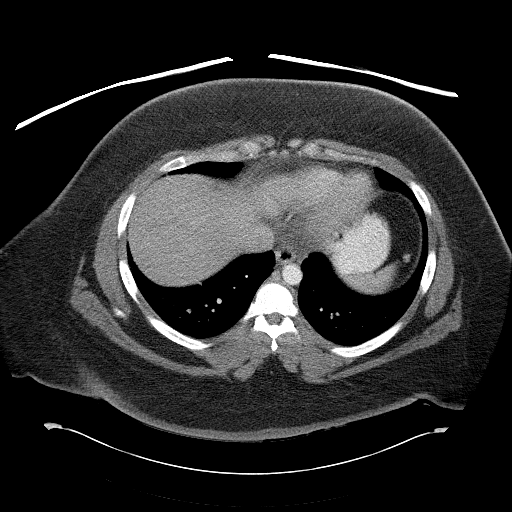
[im 57/60  soft-tissue]
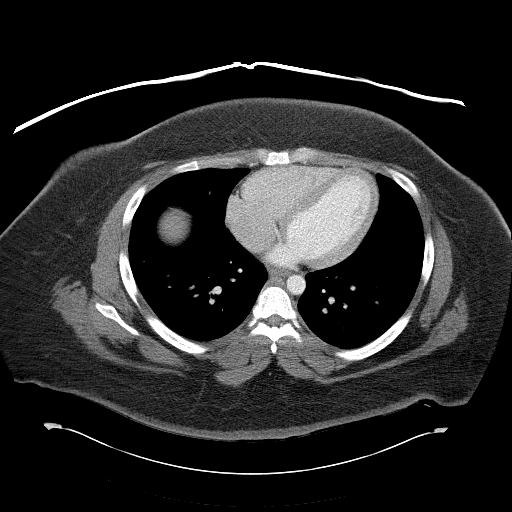

[Series 4: mpr cor post contrast (id) · coronal · 0.58mm/px · 3 of 86 slices shown]
[im 29/86  soft-tissue]
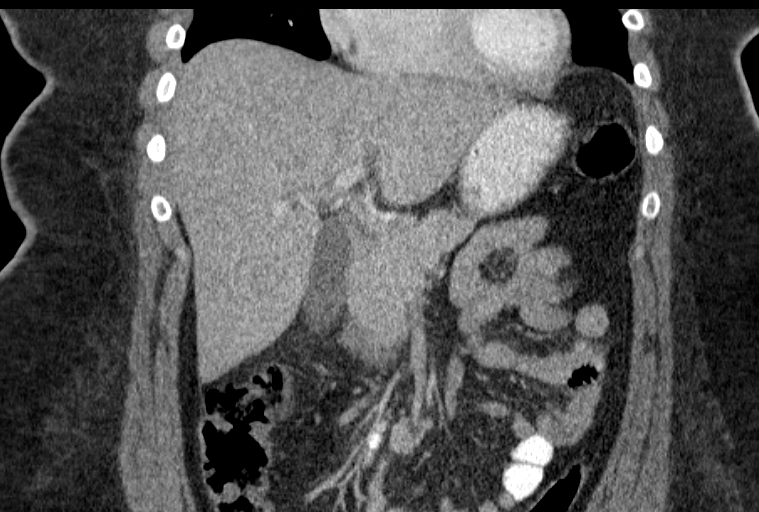
[im 38/86  soft-tissue]
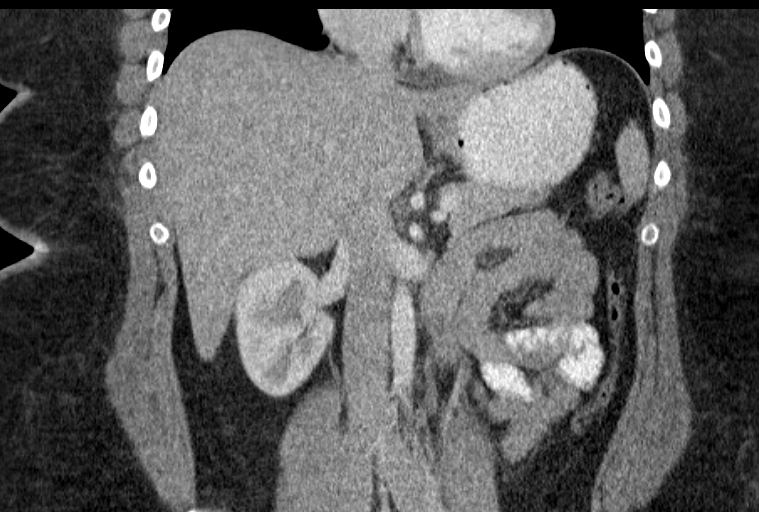
[im 48/86  soft-tissue]
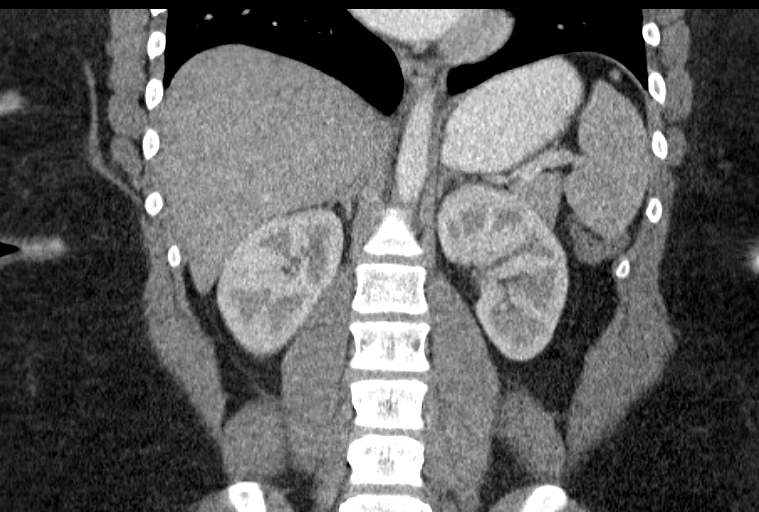

[16 of 46 positions shown; findings below may reference images not displayed]

FINDINGS: Lung bases clear.
Tiny nonspecific low attenuation focus right lobe liver, less than
5 mm diameter image 11.
Remainder of liver, spleen, pancreas, kidneys, and adrenal glands
are normal.
Symmetric nephrograms.
Scattered normal-sized mesenteric lymph nodes.
No upper abdominal mass, adenopathy, free fluid or inflammatory
process.
Stomach and upper abdominal bowel loops grossly unremarkable.
No bony abnormalities.
IMPRESSION: No acute upper abdominal abnormalities.
Single tiny nonspecific low attenuation focus right lobe liver.

## 2009-04-12 ENCOUNTER — Encounter: Payer: Self-pay | Admitting: Gastroenterology

## 2009-04-12 ENCOUNTER — Ambulatory Visit: Payer: Self-pay | Admitting: Gastroenterology

## 2009-04-13 ENCOUNTER — Ambulatory Visit: Payer: Self-pay | Admitting: Internal Medicine

## 2009-04-14 ENCOUNTER — Encounter: Payer: Self-pay | Admitting: Internal Medicine

## 2009-04-14 IMAGING — US US ABDOMEN COMPLETE
1 series · 14 of 25 positions shown · non-contrast
Comparison: [DATE]

CLINICAL DATA: UTI, nausea, vomiting, right upper quadrant pain

ULTRASOUND ABDOMEN:
TECHNIQUE: Sonography of upper abdominal structures was performed.
Examination limited by patient body habitus.

[Series 1: unknown · 0.33mm/px · 14 of 59 slices shown]
[im 1/59]
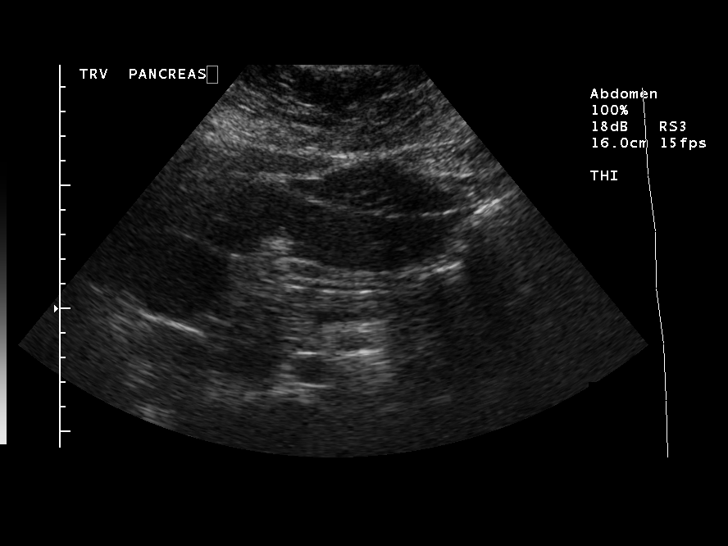
[im 5/59]
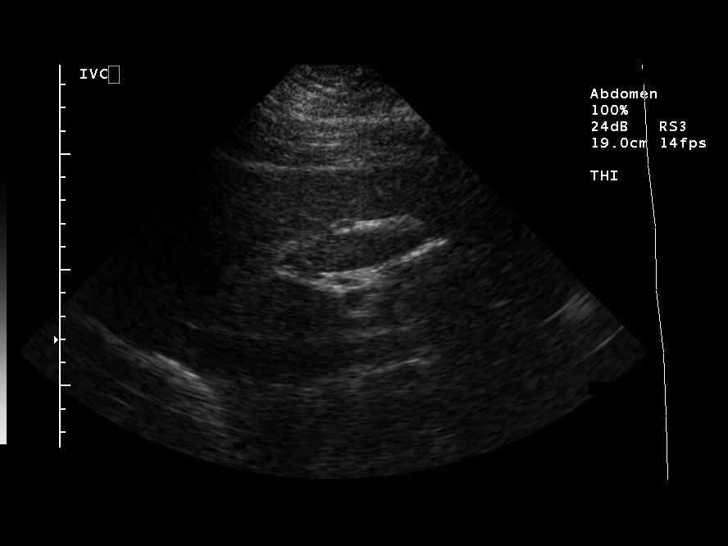
[im 10/59]
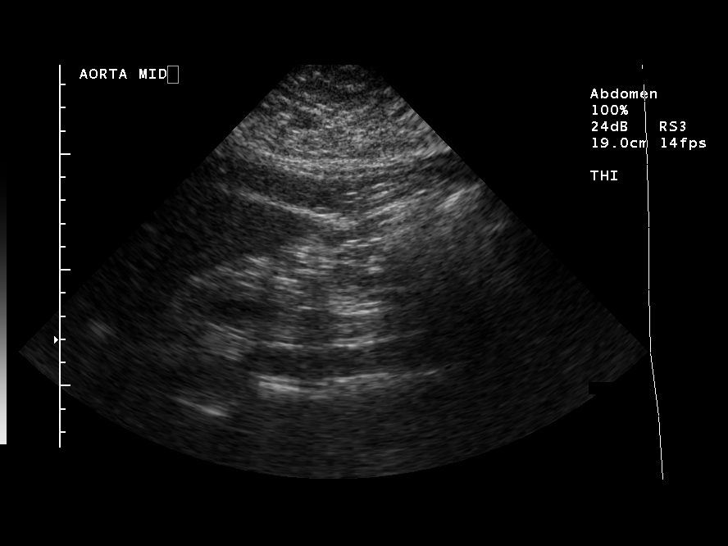
[im 15/59]
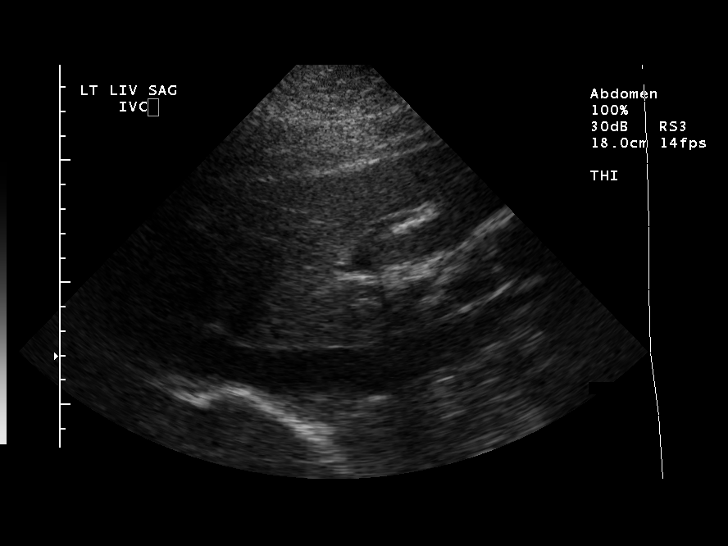
[im 20/59]
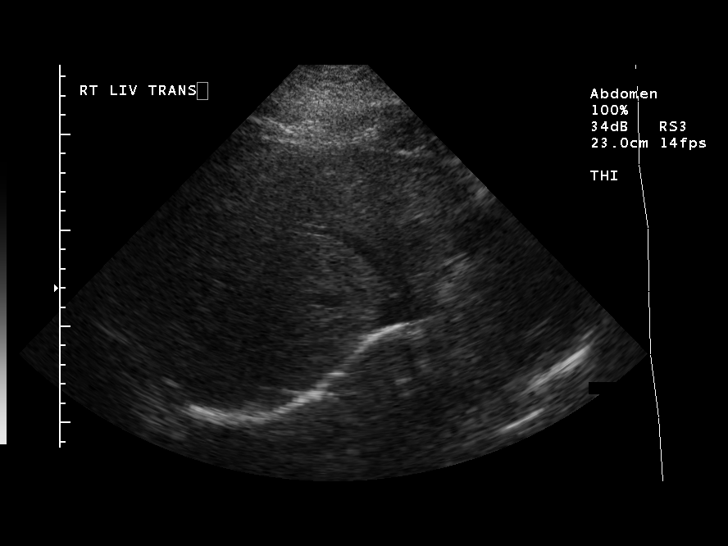
[im 22/59]
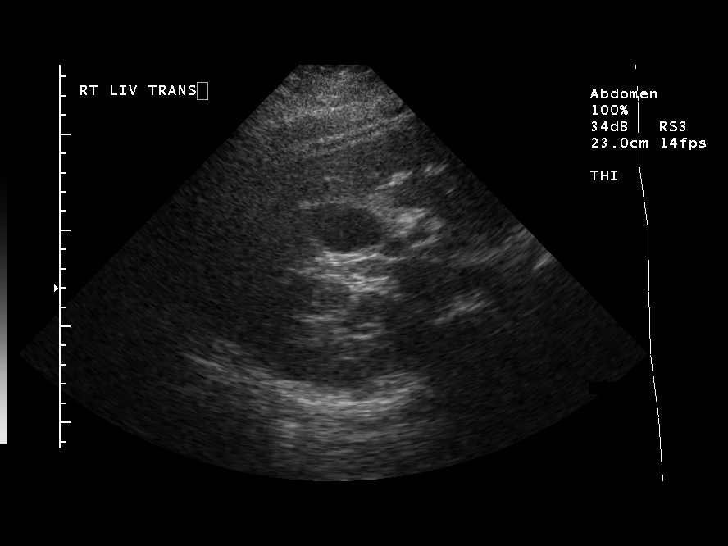
[im 27/59]
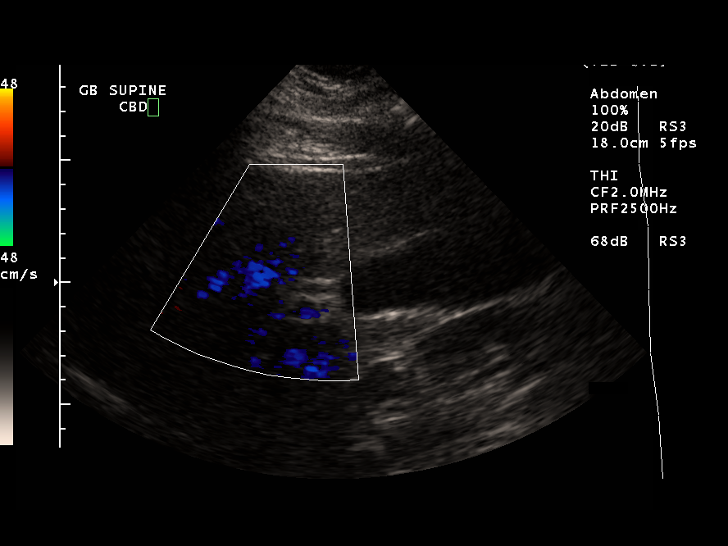
[im 32/59]
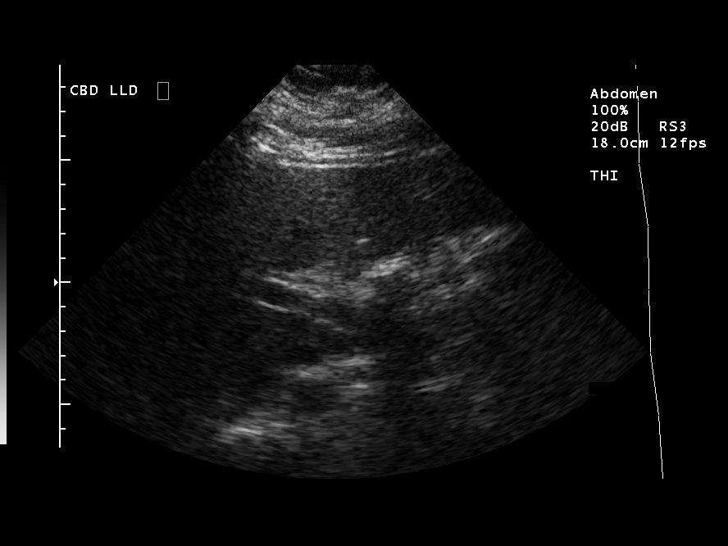
[im 37/59]
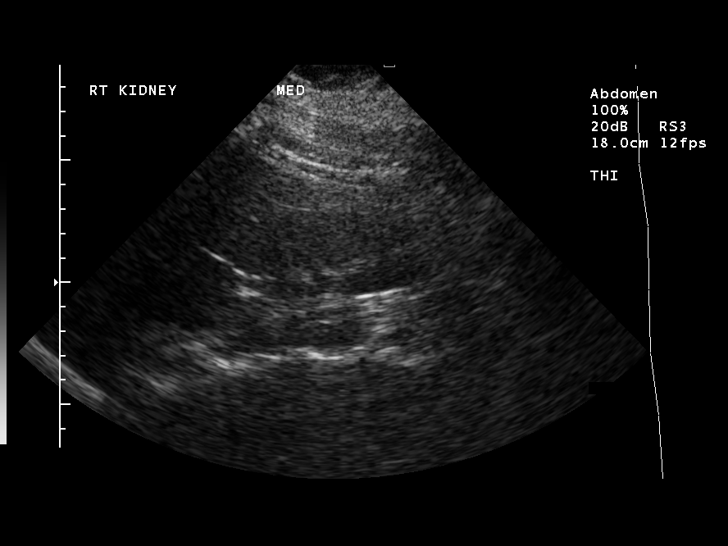
[im 39/59]
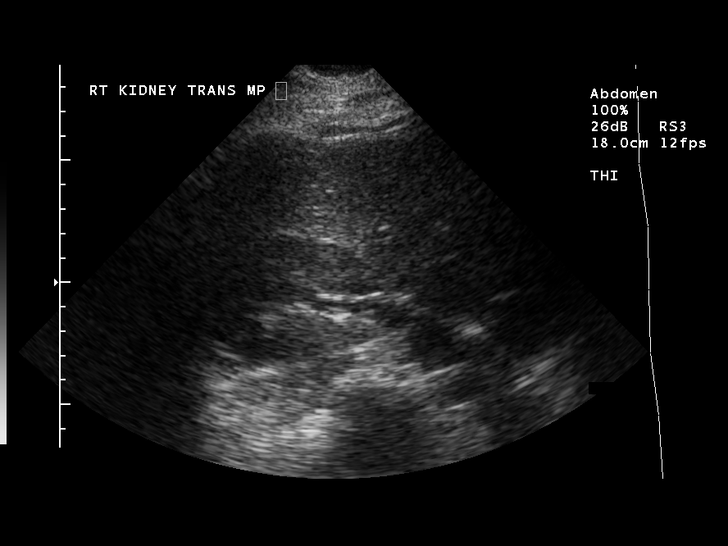
[im 44/59]
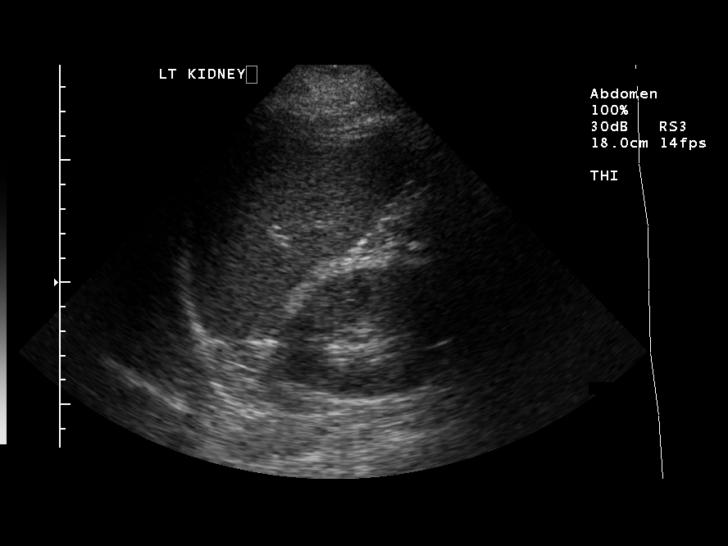
[im 49/59]
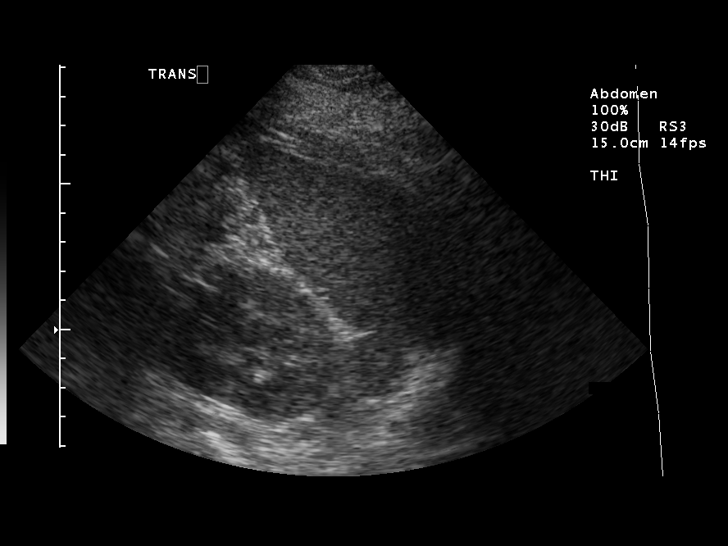
[im 54/59]
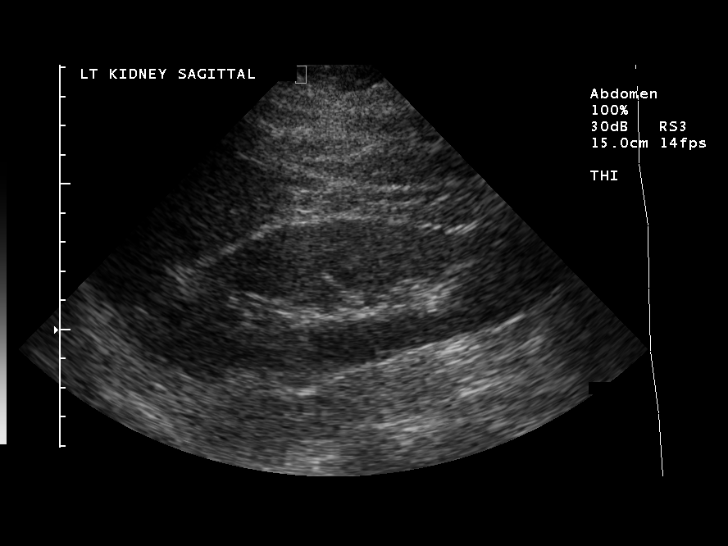
[im 59/59]
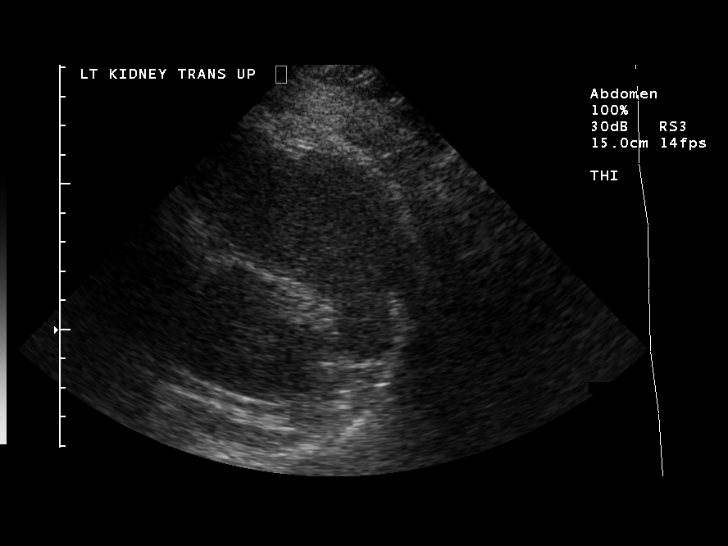

[14 of 25 positions shown; findings below may reference images not displayed]

Gallbladder:  Normally distended without stones or wall thickening.
No pericholecystic fluid or sonographic Murphy's sign.

Common bile duct:  5 mm diameter, normal

Liver:  Suboptimally visualized due to patient body habitus.  No
gross hepatic mass or nodularity.

IVC:  Unremarkable

Pancreas:  Tail poorly visualized due to bowel gas and body
habitus, remainder normal appearance.

Spleen:  Normal size, 8.9 cm length. Lobulated appearance, as noted
on prior CT of [DATE].  No focal splenic mass lesion.

Right kidney:  13.9 cm length.  Normal morphology without mass or
hydronephrosis.

Left Kidney:  14.1 cm length.  Normal morphology without mass or
hydronephrosis.

Aorta:  Limited visualization due to bowel gas.

Other:  No ascites.
IMPRESSION: Limitations of exam as noted above.
No acute upper abdominal abnormalities.

## 2009-04-15 ENCOUNTER — Ambulatory Visit: Payer: Self-pay | Admitting: Gastroenterology

## 2009-04-17 ENCOUNTER — Ambulatory Visit: Payer: Self-pay | Admitting: Internal Medicine

## 2009-04-18 ENCOUNTER — Telehealth (INDEPENDENT_AMBULATORY_CARE_PROVIDER_SITE_OTHER): Payer: Self-pay | Admitting: *Deleted

## 2009-05-01 ENCOUNTER — Emergency Department (HOSPITAL_COMMUNITY): Admission: EM | Admit: 2009-05-01 | Discharge: 2009-05-01 | Payer: Self-pay | Admitting: Emergency Medicine

## 2009-05-01 IMAGING — CR DG ABDOMEN ACUTE W/ 1V CHEST
4 series · 4 of 4 positions shown · non-contrast
Comparison: Chest and two views abdomen [DATE] and CT abdomen
[DATE]

CLINICAL DATA: Abdominal pain, nausea and vomiting.

ACUTE ABDOMEN SERIES (ABDOMEN 2 VIEW & CHEST 1 VIEW)

[view not recorded (1 of 4)]
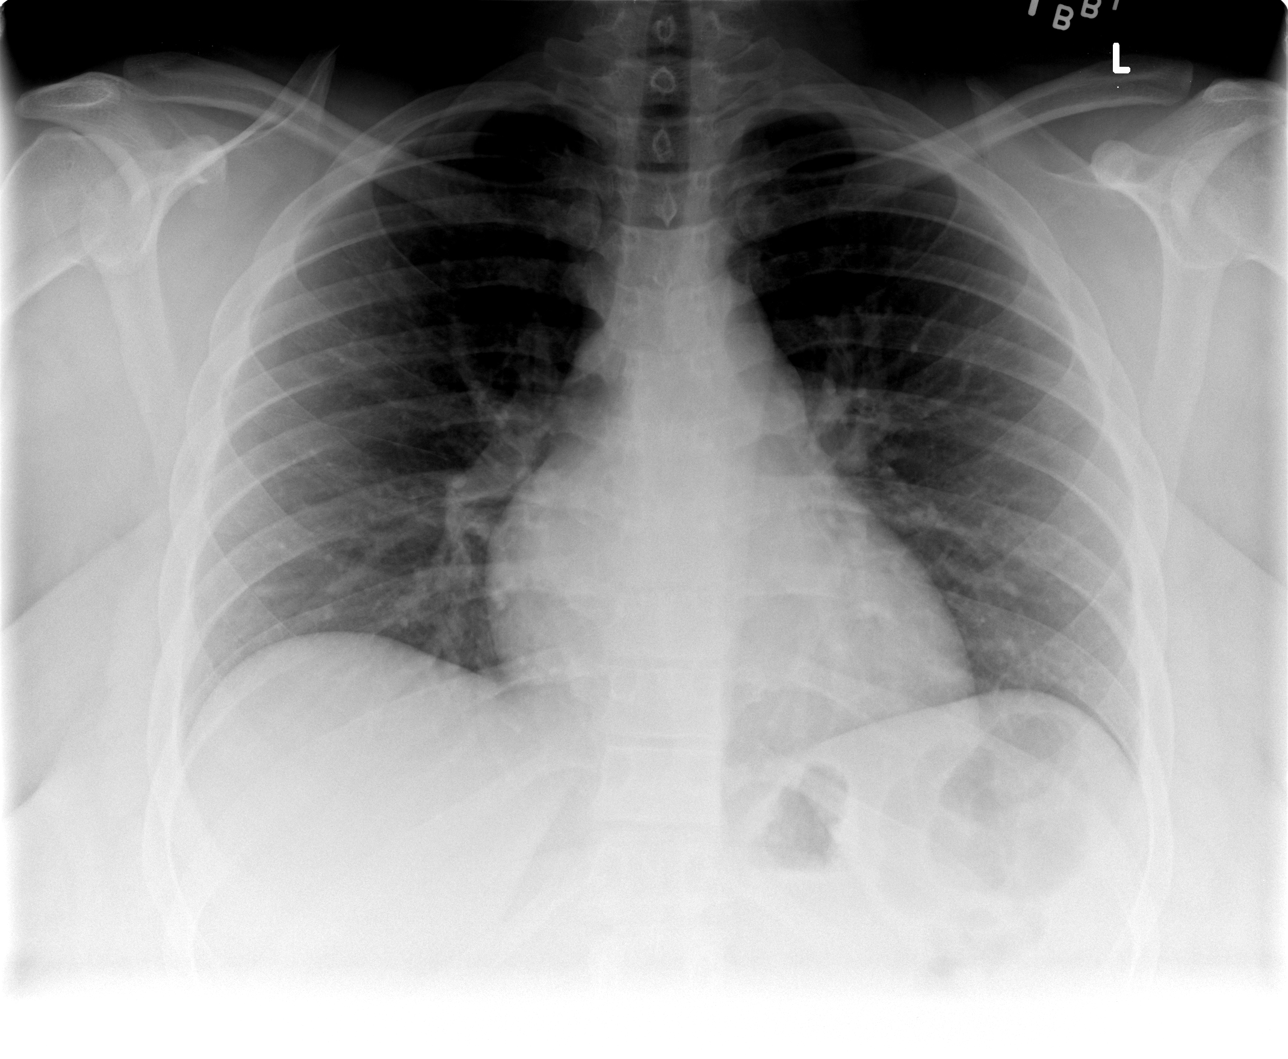

[view not recorded (2 of 4)]
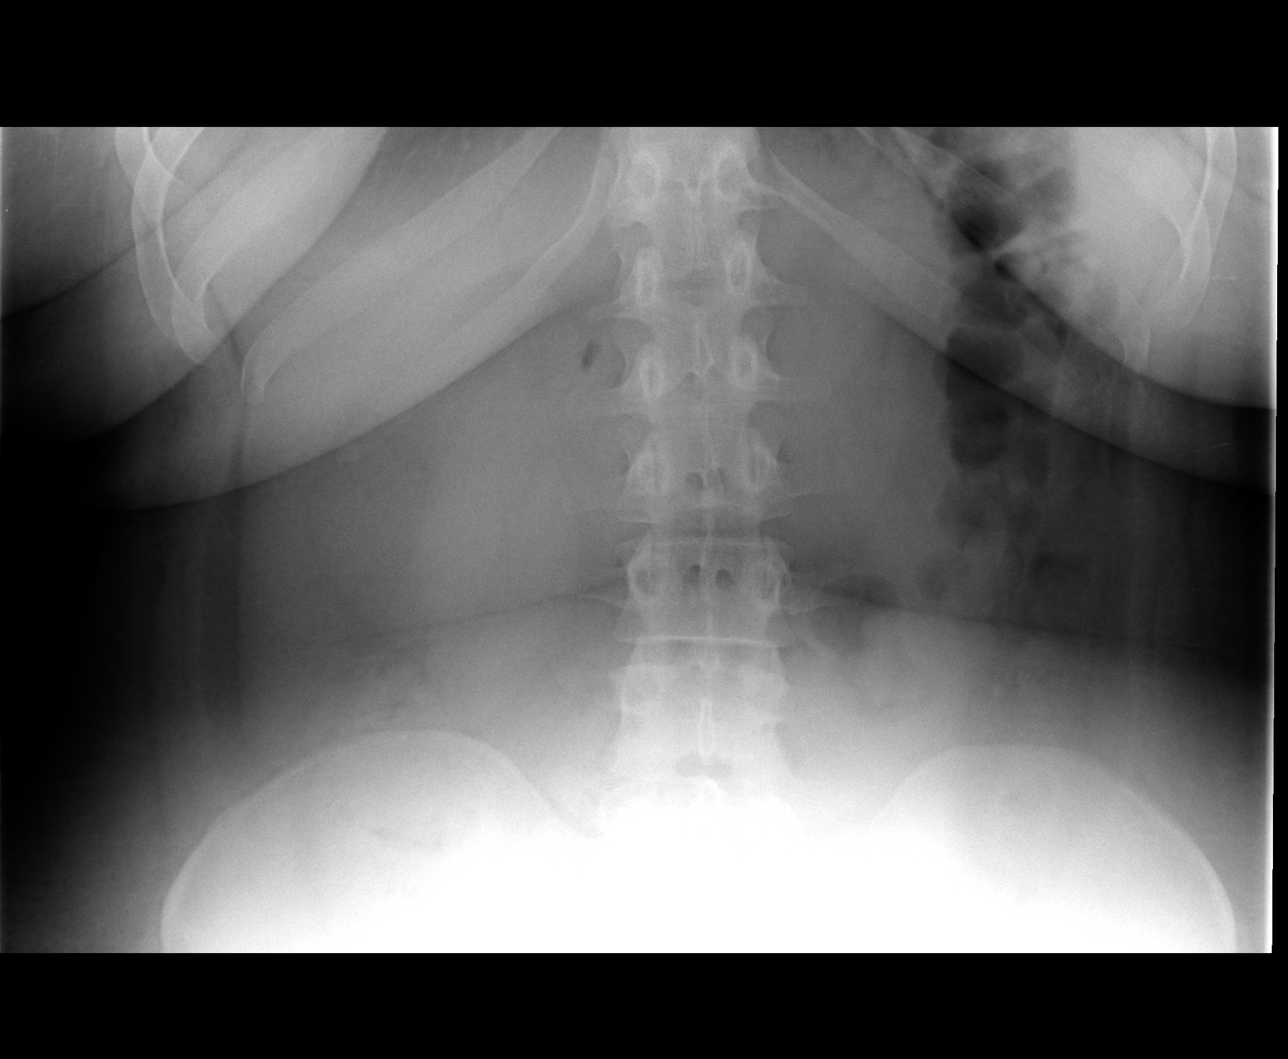

[view not recorded (3 of 4)]
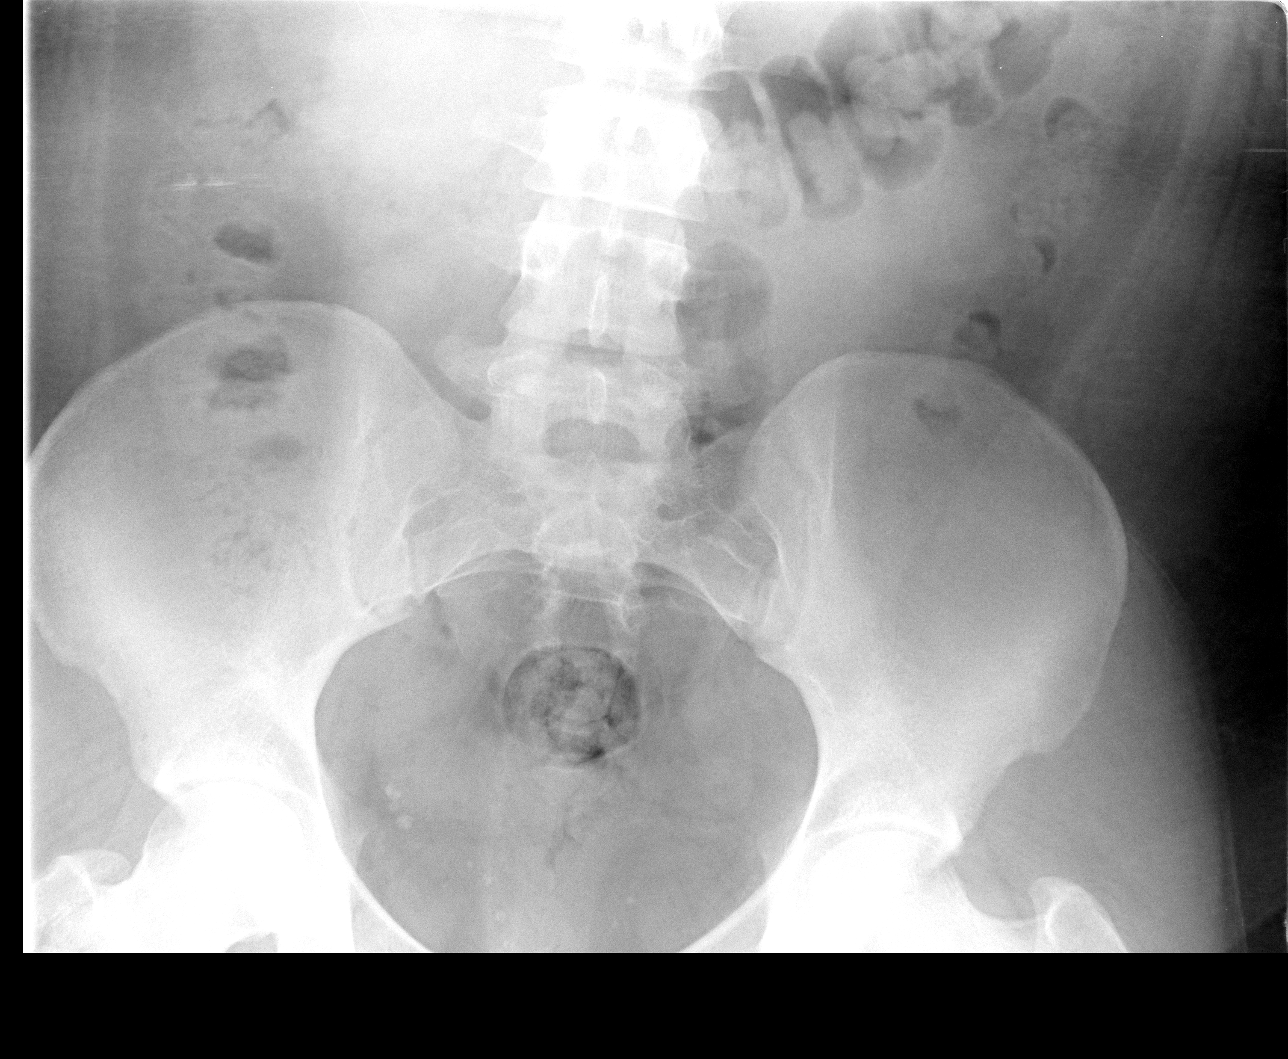

[view not recorded (4 of 4)]
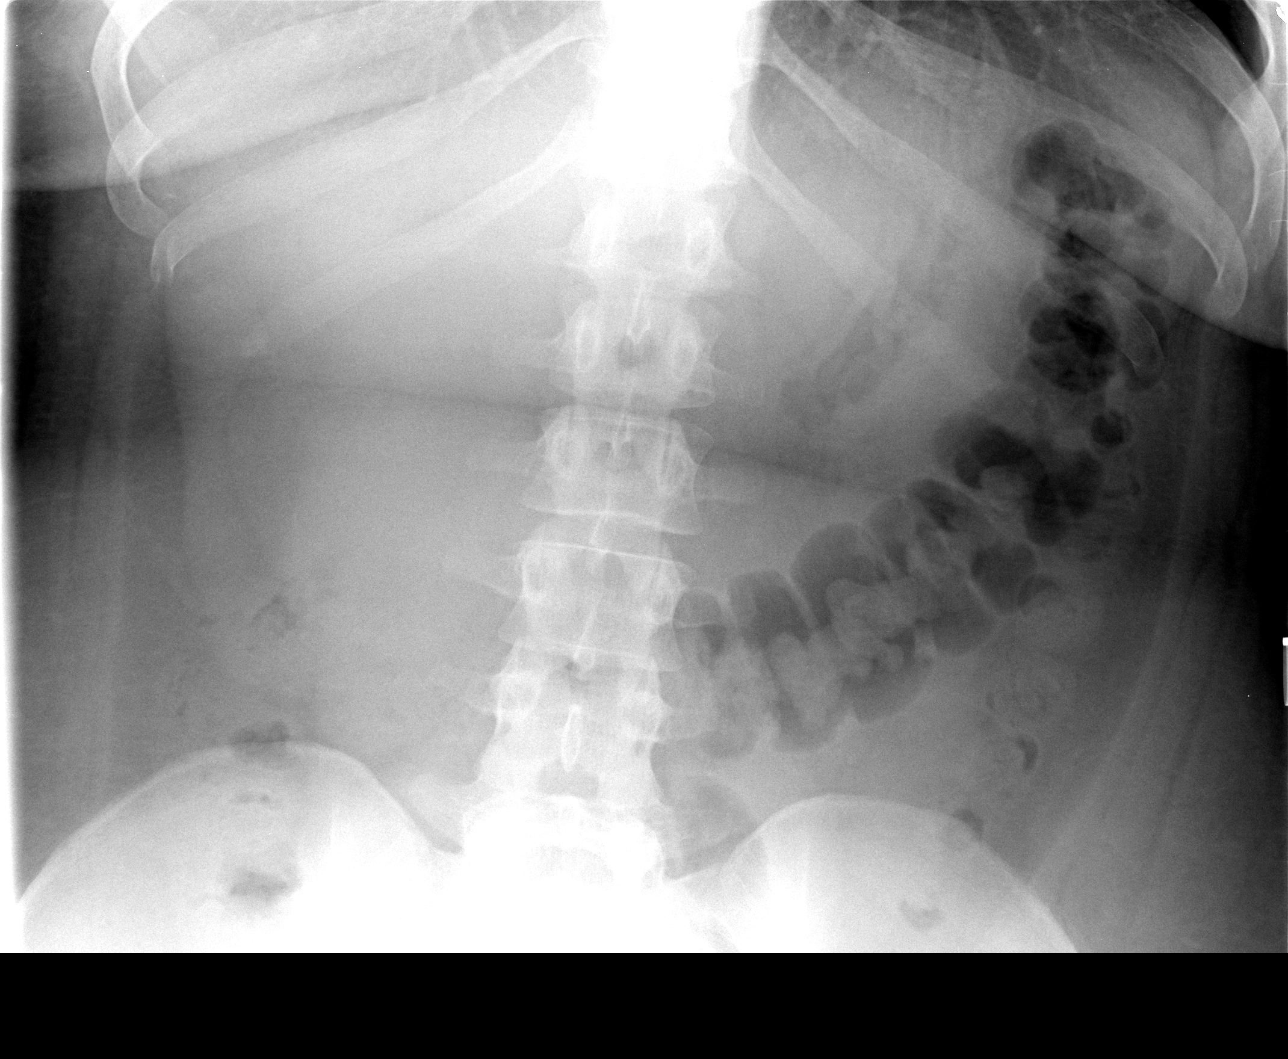

[4 of 4 positions shown; findings below may reference images not displayed]

FINDINGS: Single view of the chest demonstrates clear lungs and
normal heart size.  No pleural effusion.

Two views of the abdomen show a normal bowel gas pattern.  There is
no free intraperitoneal air or expect abdominal calcification.
IMPRESSION: Negative exam.

## 2009-05-16 ENCOUNTER — Encounter: Payer: Self-pay | Admitting: Gastroenterology

## 2009-08-19 ENCOUNTER — Emergency Department (HOSPITAL_COMMUNITY): Admission: EM | Admit: 2009-08-19 | Discharge: 2009-08-19 | Payer: Self-pay | Admitting: Emergency Medicine

## 2010-02-23 ENCOUNTER — Emergency Department (HOSPITAL_COMMUNITY): Admission: EM | Admit: 2010-02-23 | Discharge: 2010-02-23 | Payer: Self-pay | Admitting: Emergency Medicine

## 2010-02-23 IMAGING — CR DG CHEST 2V
2 series · 2 of 2 positions shown · non-contrast
Comparison: Chest x-ray of [DATE]

CLINICAL DATA: Chest pain, short of breath,

CHEST - 2 VIEW

[view not recorded (1 of 2)]
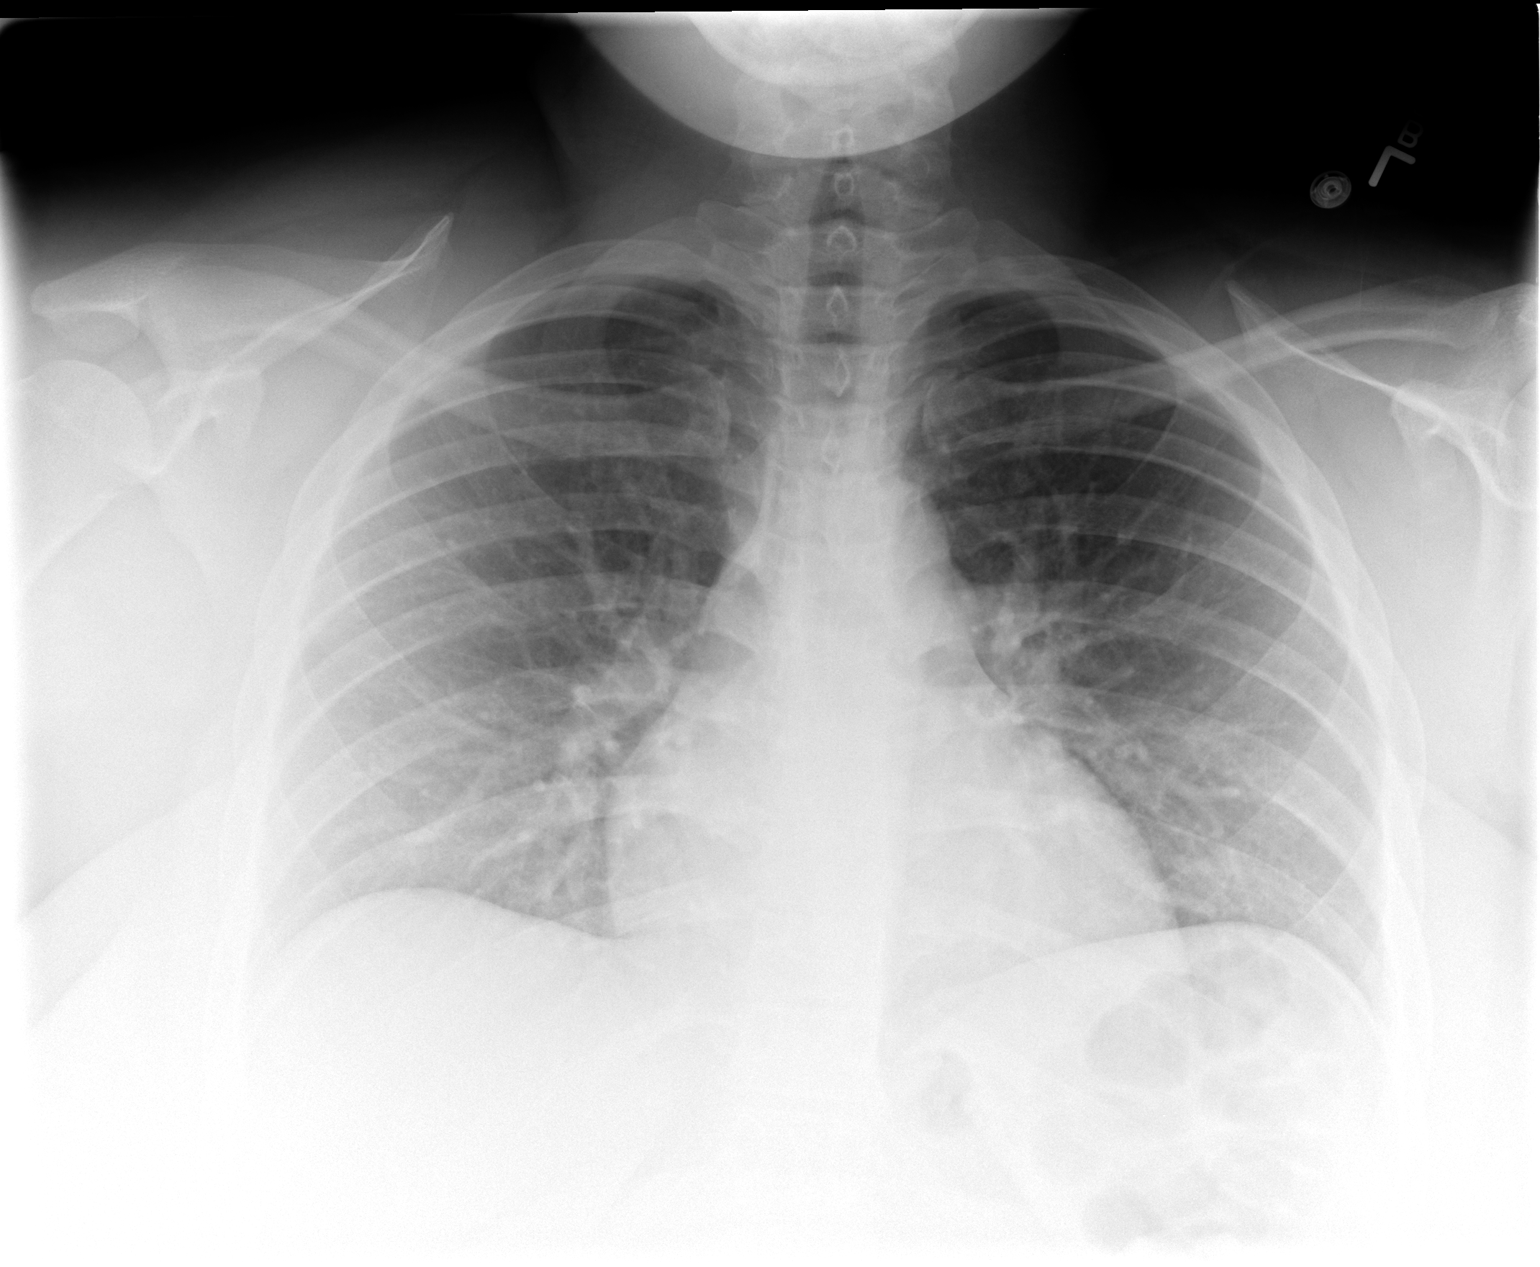

[view not recorded (2 of 2)]
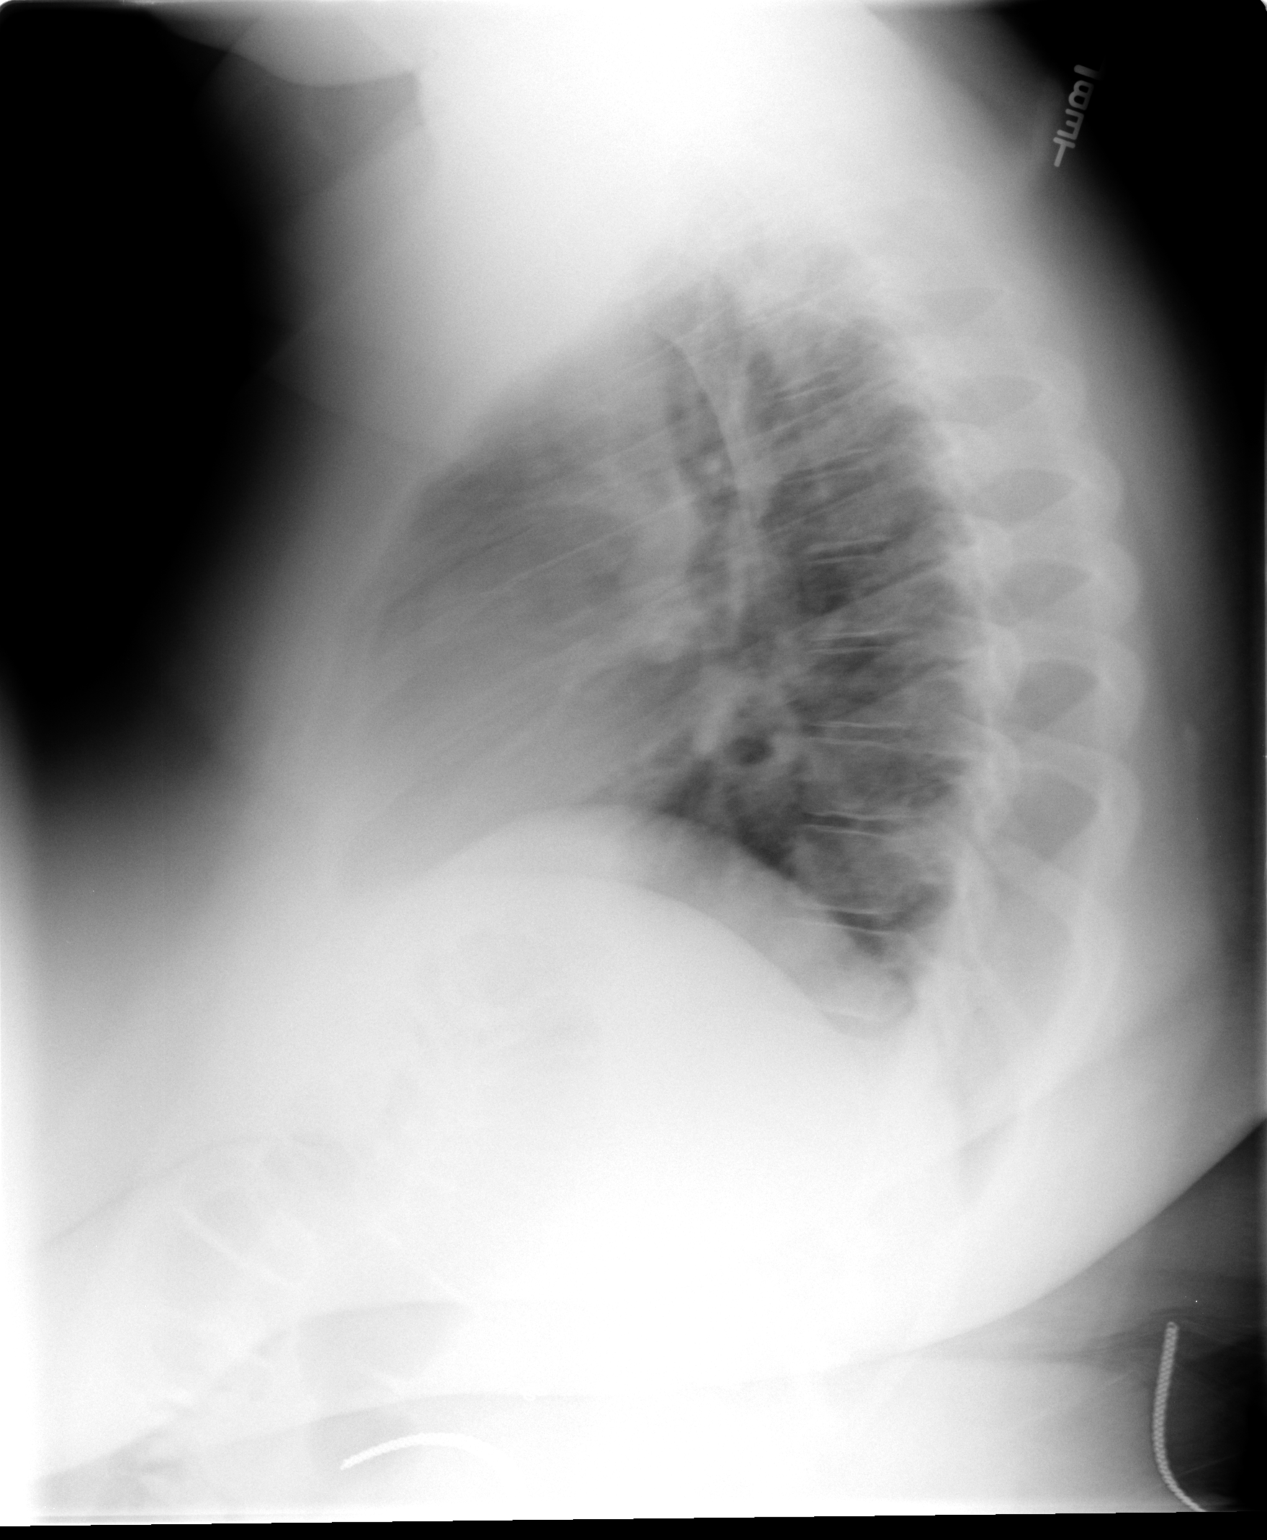

[2 of 2 positions shown; findings below may reference images not displayed]

FINDINGS: No active infiltrate or effusion is seen.  The heart is
within normal limits in size.  No bony abnormality is seen.
IMPRESSION: No active lung disease.

## 2010-02-23 IMAGING — CT CT ANGIO CHEST
1 of 6 series · 5 of 36 positions shown · IV contrast (Omnipaque 300)
Comparison: Chest x-ray of [DATE]

CLINICAL DATA: Chest pain, shortness of breath, lower extremity
swelling

CT ANGIOGRAPHY CHEST WITH CONTRAST
TECHNIQUE: Multidetector CT imaging of the chest was performed
using the standard protocol during bolus administration of
intravenous contrast.  Multiplanar CT image reconstructions
including MIPs were obtained to evaluate the vascular anatomy.
Contrast:  100 ml [T4]

[Series 4: pe 3.0 b40f · axial · 0.63mm/px · z∈[-361,-205]mm · 5 of 78 slices shown]
[im 13/78  lung]
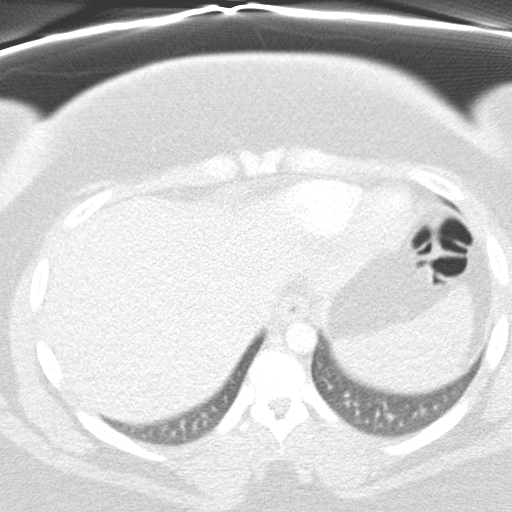
[im 26/78  mediastinal]
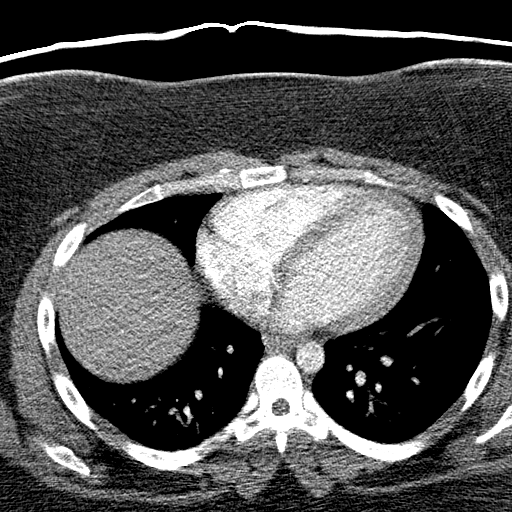
[im 39/78  lung]
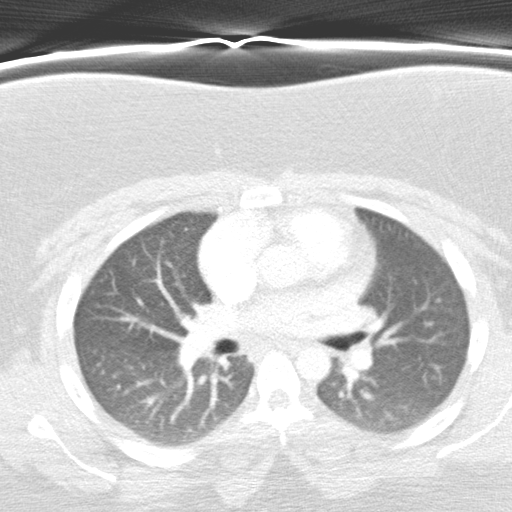
[im 52/78  mediastinal]
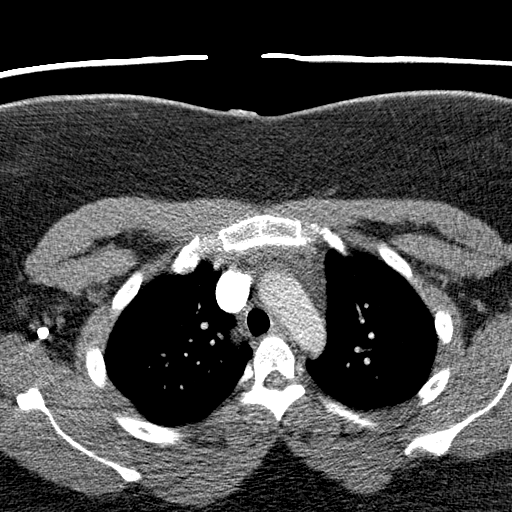
[im 65/78  lung]
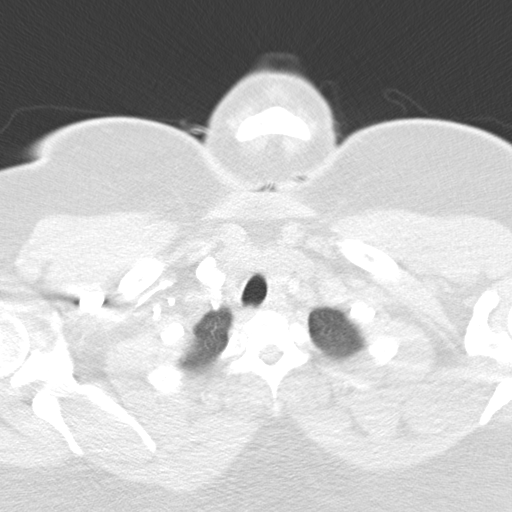

[5 of 36 positions shown; findings below may reference images not displayed]

FINDINGS: The resolution of this study is limited by large patient
body habitus.  However the pulmonary arteries opacify and no large
central emboli are evident.  The thoracic aorta opacifies with no
acute abnormality noted.  The heart is mildly enlarged.  No
mediastinal or hilar adenopathy is seen.

On the lung window images, no infiltrate is seen.  No lung nodule
is noted and there is no evidence of pleural effusion.  No acute
bony abnormality is seen.

Review of the MIP images confirms the above findings.
IMPRESSION: This study is limited in resolution by large body habitus, but no
pulmonary embolus is seen and no acute abnormality is noted.

## 2010-02-24 ENCOUNTER — Ambulatory Visit (HOSPITAL_COMMUNITY): Admission: RE | Admit: 2010-02-24 | Discharge: 2010-02-24 | Payer: Self-pay | Admitting: Emergency Medicine

## 2010-02-24 IMAGING — US US EXTREM LOW VENOUS BILAT
1 series · 14 of 24 positions shown · non-contrast
Comparison: None

CLINICAL DATA: LEGS SWELLING;;

BILATERAL LOWER EXTREMITY VENOUS DUPLEX ULTRASOUND
TECHNIQUE: Gray-scale sonography with compression, as well as color
and duplex ultrasound, were performed to evaluate the deep venous
system from the level of the common femoral vein through the
popliteal and proximal calf veins.

[Series 1: us extrem low venous bilat · 14 of 64 slices shown]
[im 1/64]
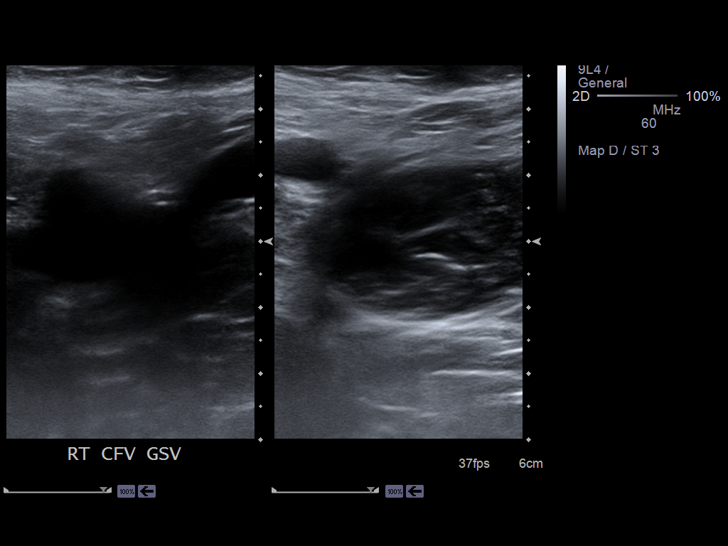
[im 6/64]
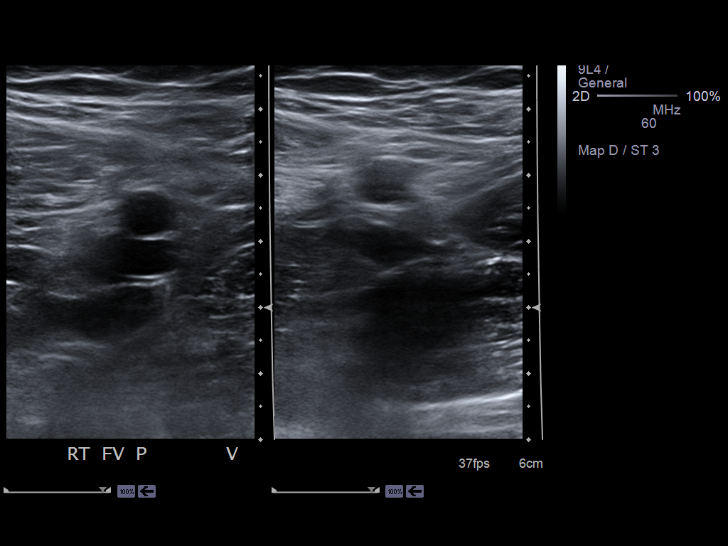
[im 11/64]
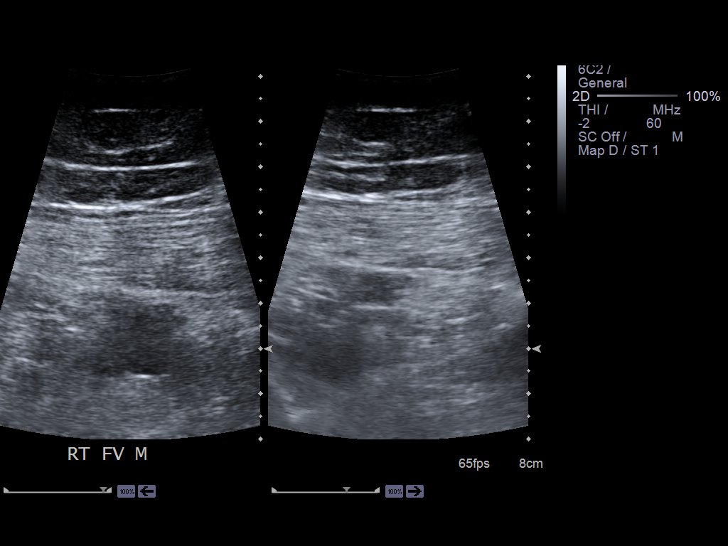
[im 17/64]
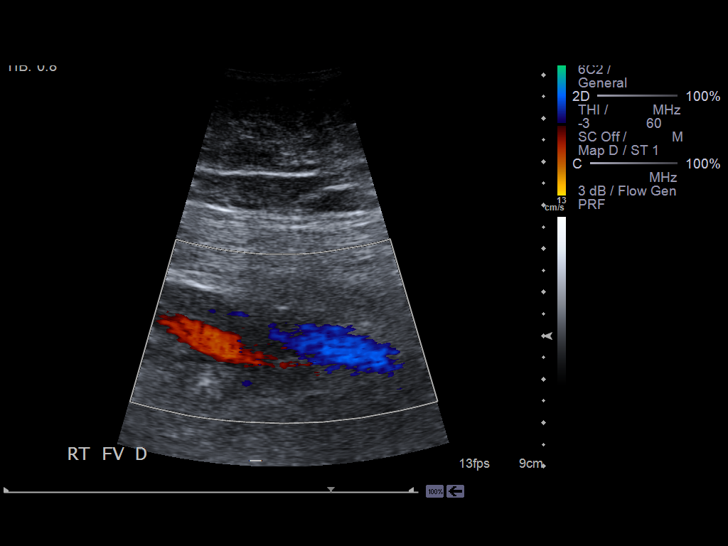
[im 20/64]
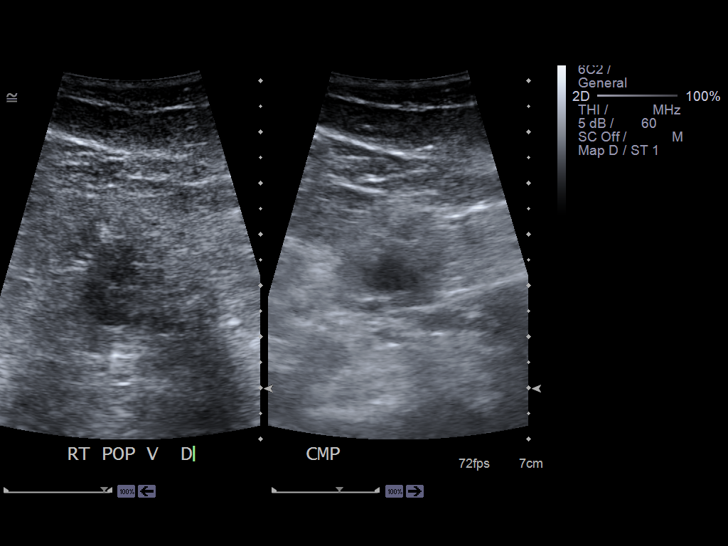
[im 25/64]
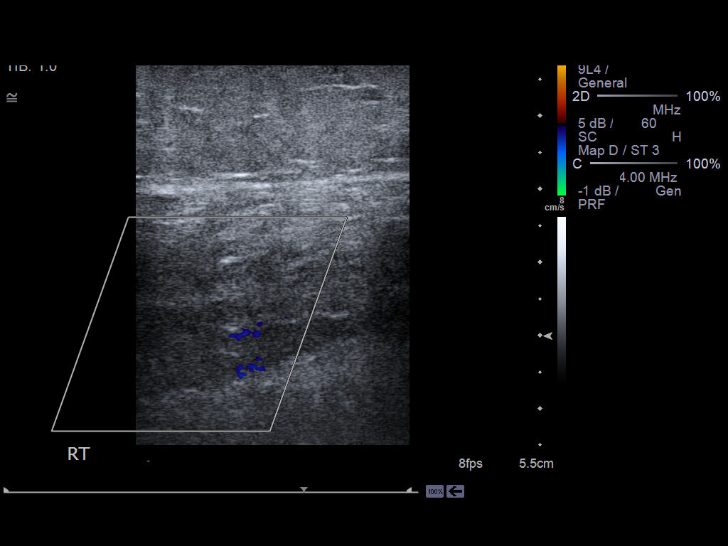
[im 31/64]
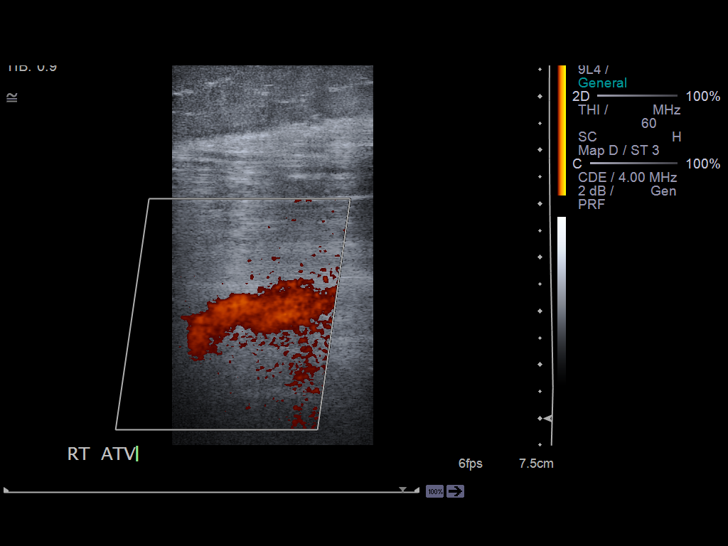
[im 33/64]
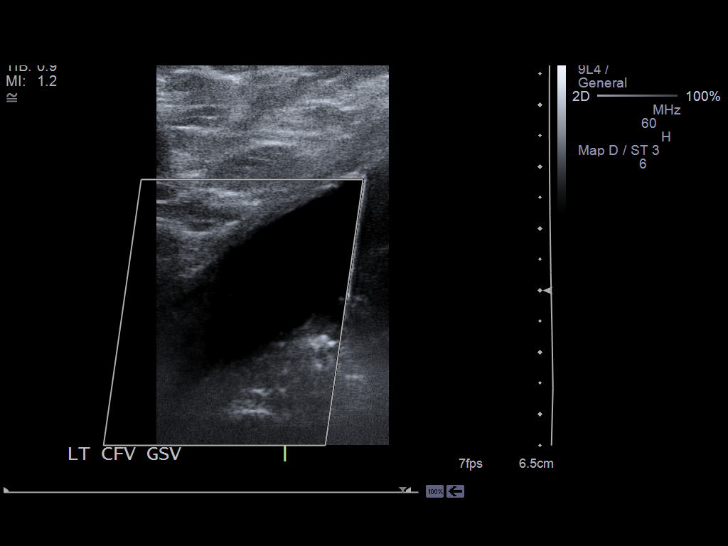
[im 39/64]
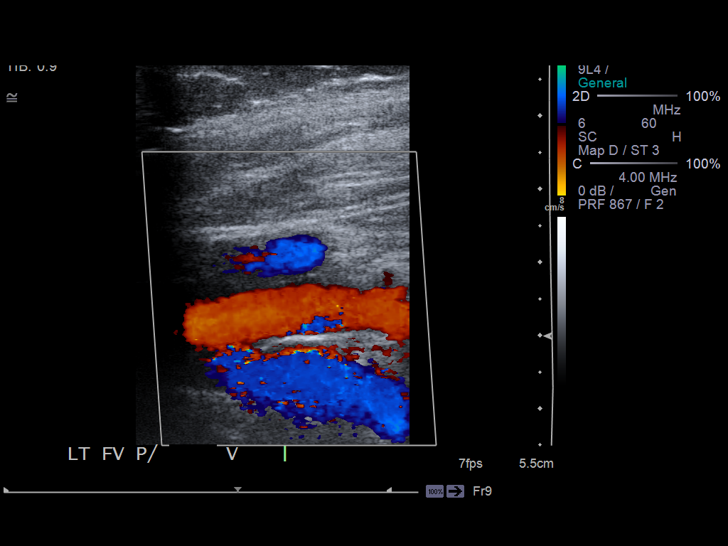
[im 44/64]
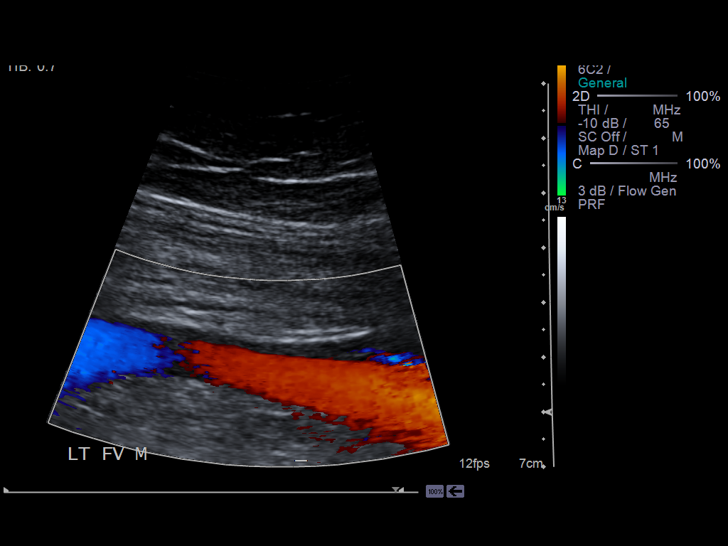
[im 50/64]
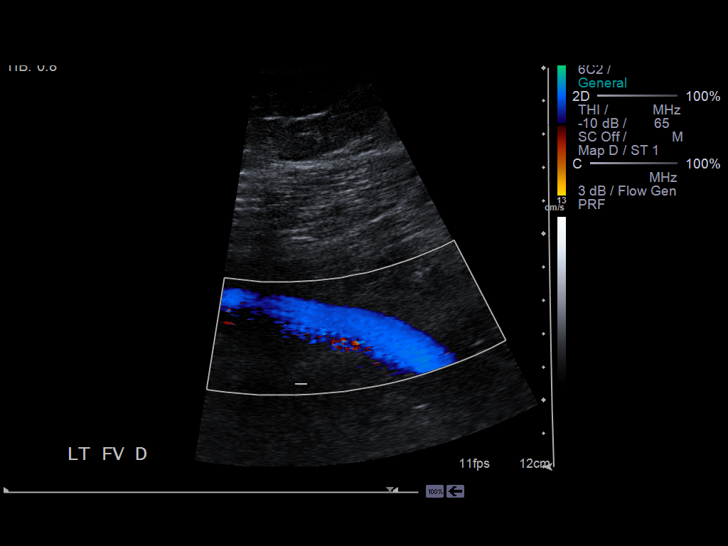
[im 53/64]
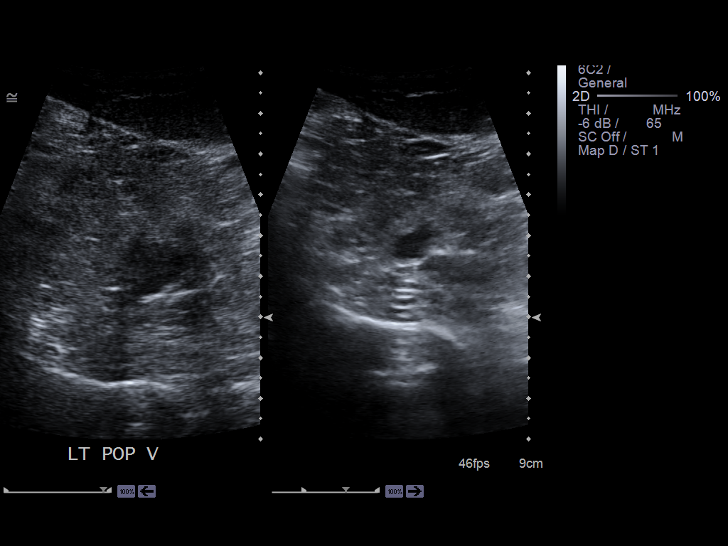
[im 58/64]
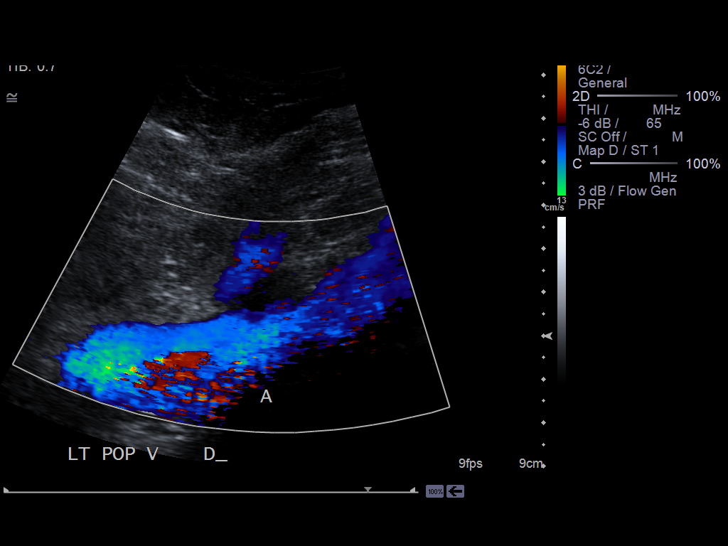
[im 64/64]
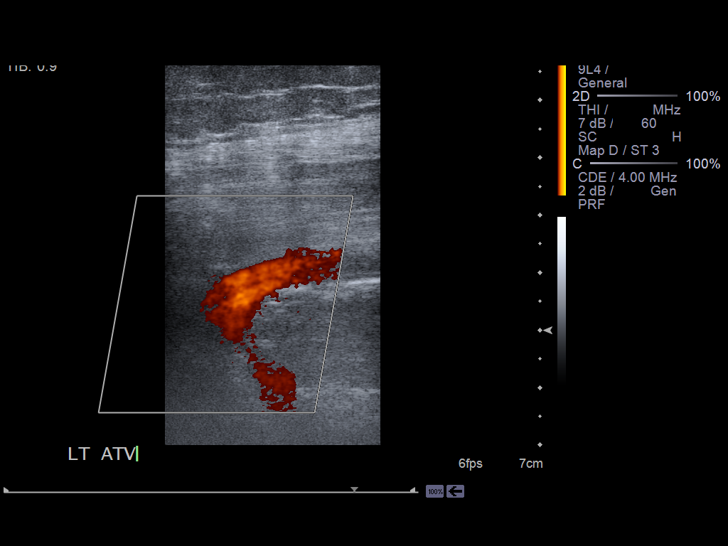

[14 of 24 positions shown; findings below may reference images not displayed]

FINDINGS: Normal compressibility and normal Doppler signal within
the common femoral, superficial femoral and popliteal veins, down
to the proximal calf veins.  No grayscale filling defects to
suggest DVT.
IMPRESSION: No evidence of bilateral lower extremity deep vein thrombosis.

## 2010-04-24 ENCOUNTER — Emergency Department (HOSPITAL_COMMUNITY): Admission: EM | Admit: 2010-04-24 | Discharge: 2010-04-24 | Payer: Self-pay | Admitting: Emergency Medicine

## 2010-04-24 IMAGING — CT CT HEAD W/O CM
1 series · 16 of 30 positions shown, 20 images · non-contrast
Comparison: None.

CLINICAL DATA: 24-year-old female with headache, nausea, vomiting.
History migraines.

CT HEAD WITHOUT CONTRAST
TECHNIQUE: Contiguous axial images were obtained from the base of
the skull through the vertex without contrast.

[Series 2: head_seq 4.5 h37s st · axial · 0.43mm/px · z∈[-185,-41]mm · 16 of 36 slices shown, 20 images]
[im 2/36  brain]
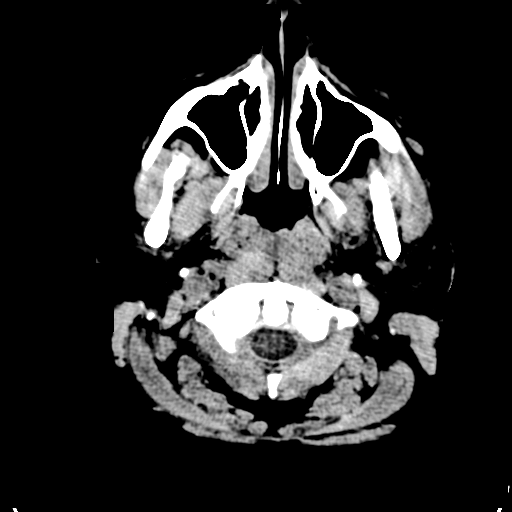
[im 2/36  bone]
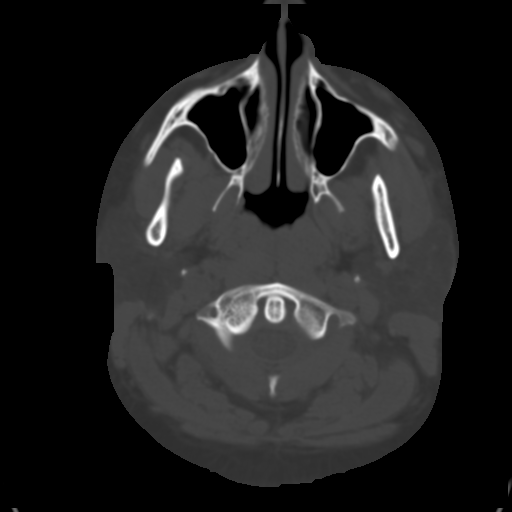
[im 4/36  brain]
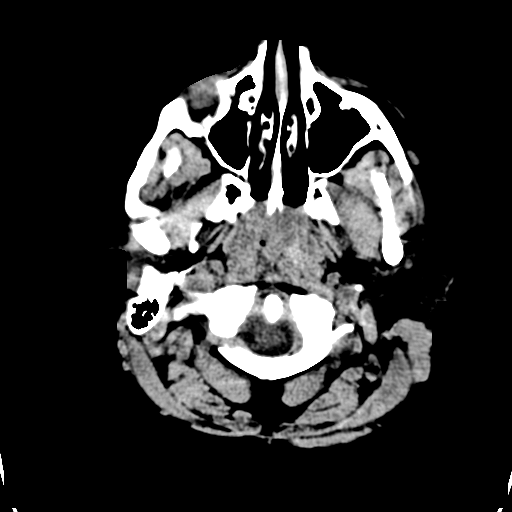
[im 7/36  brain]
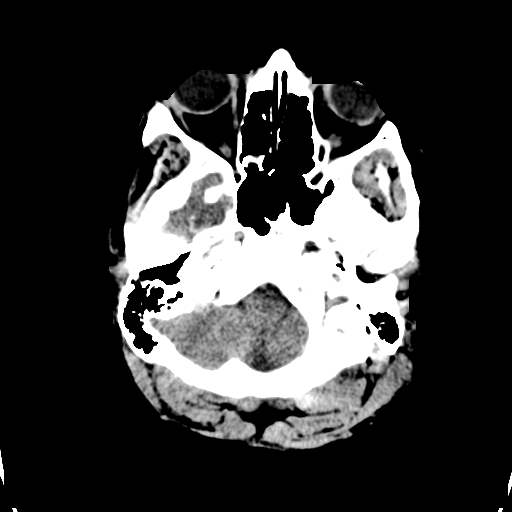
[im 9/36  brain]
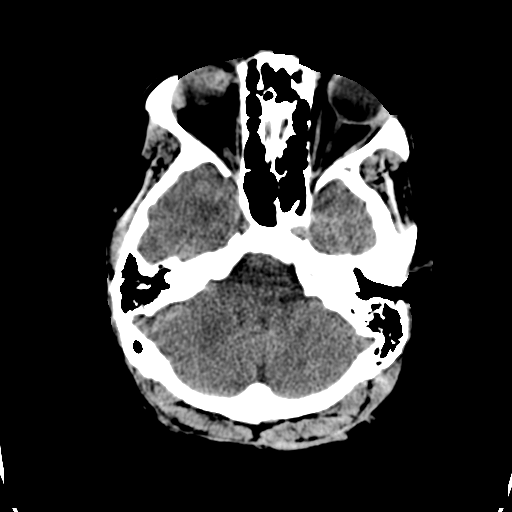
[im 10/36  brain]
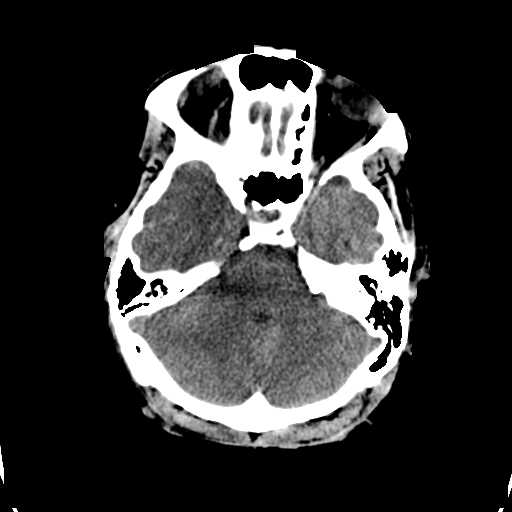
[im 10/36  bone]
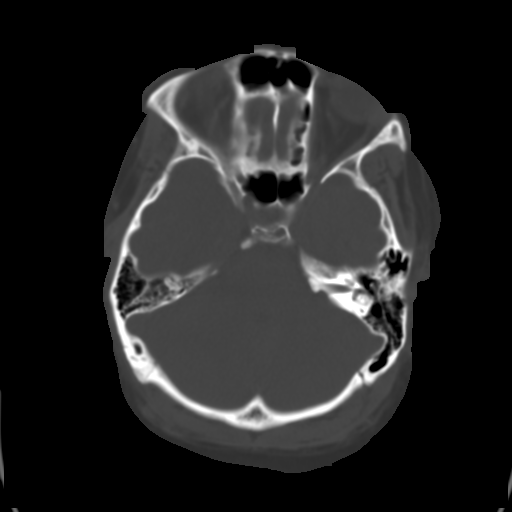
[im 13/36  brain]
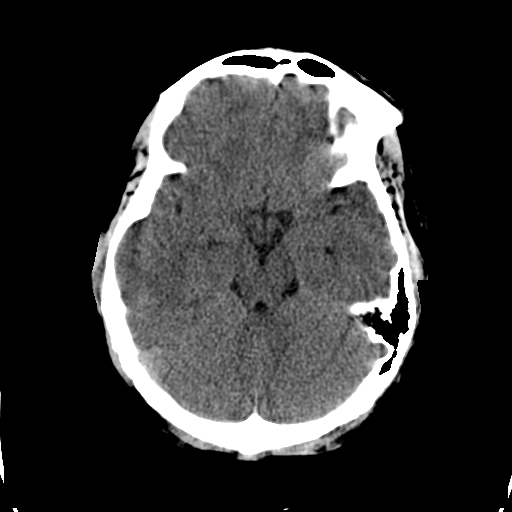
[im 15/36  brain]
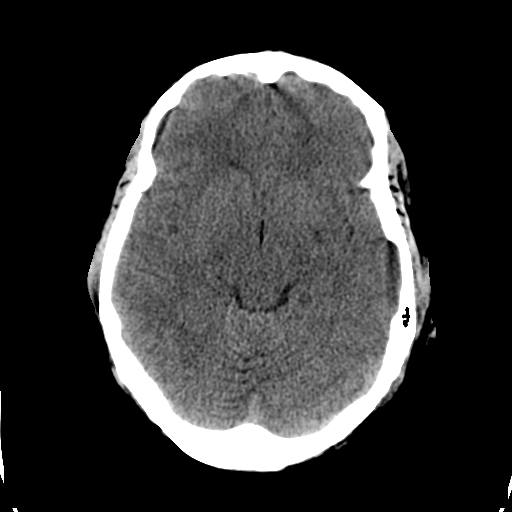
[im 17/36  brain]
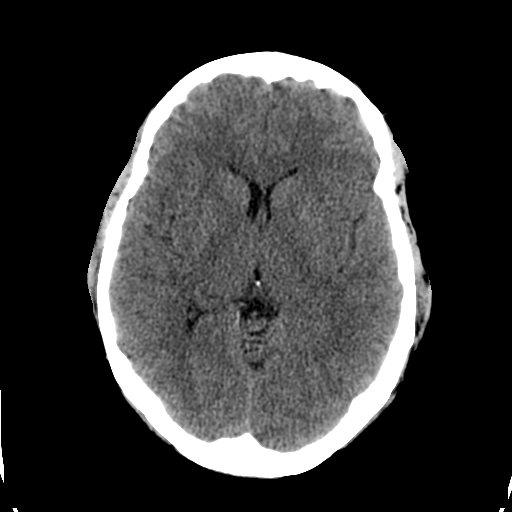
[im 19/36  brain]
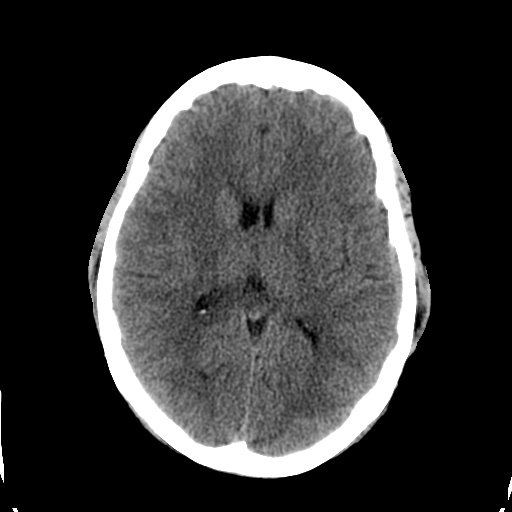
[im 19/36  bone]
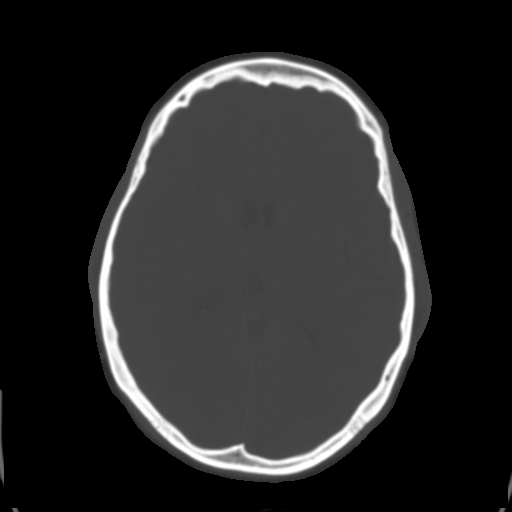
[im 21/36  brain]
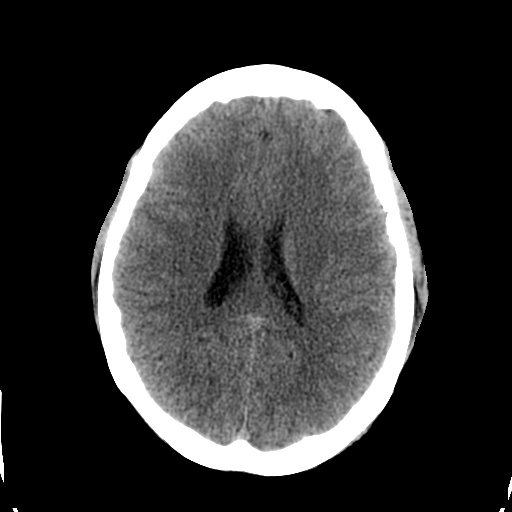
[im 23/36  brain]
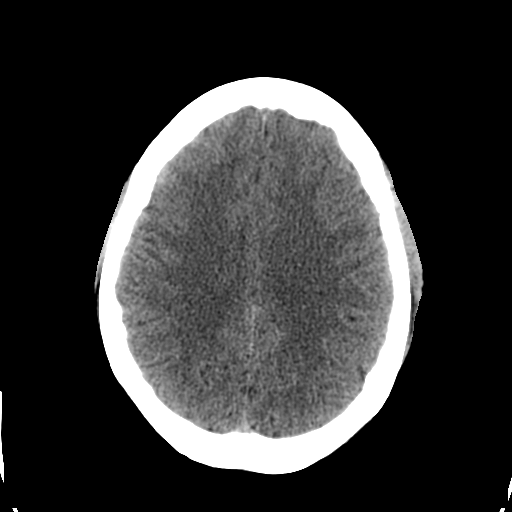
[im 26/36  brain]
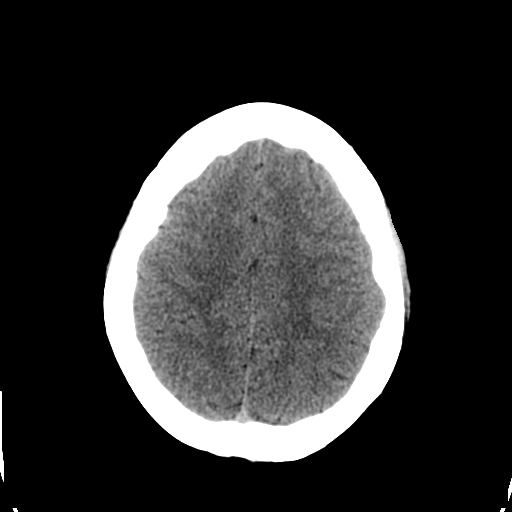
[im 27/36  brain]
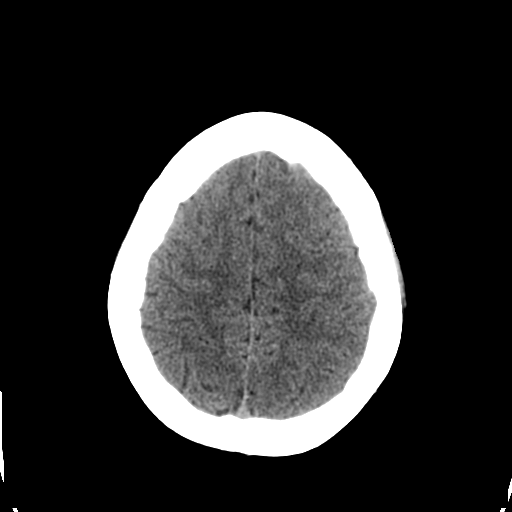
[im 27/36  bone]
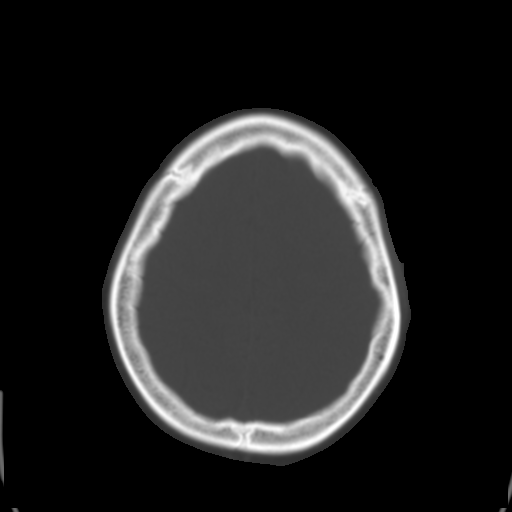
[im 29/36  brain]
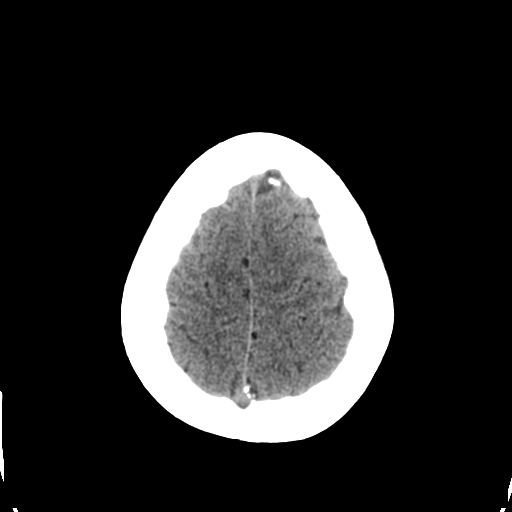
[im 32/36  brain]
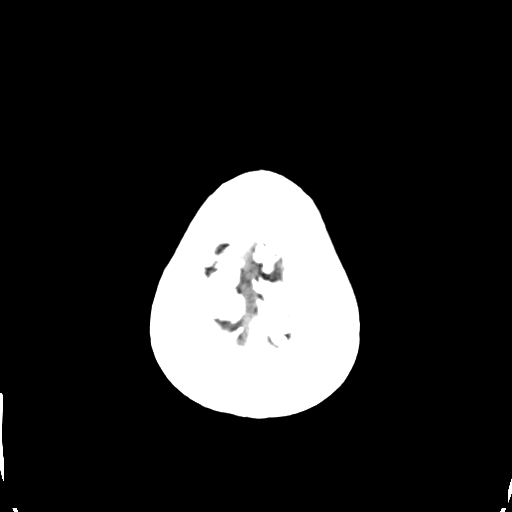
[im 34/36  brain]
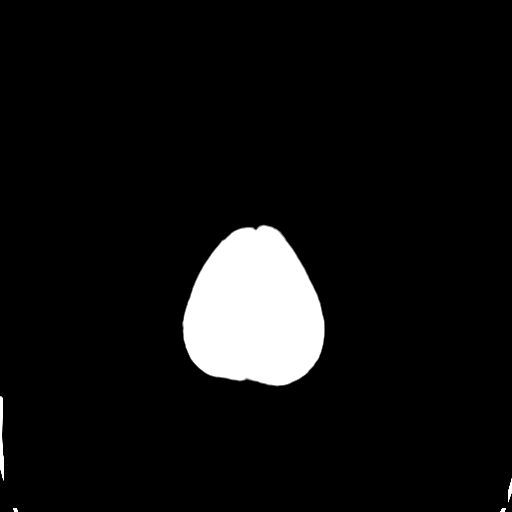

[16 of 30 positions shown; findings below may reference images not displayed]

FINDINGS: Visualized orbits and scalp soft tissues are within
normal limits.  No acute osseous abnormality identified.  Isolated
opacified posterior ethmoid air cell versus small ethmoid mucocele.
Other visualized paranasal sinuses and mastoids are clear.

Cerebral volume is within normal limits for age.  No midline shift,
ventriculomegaly, mass effect, evidence of mass lesion,
intracranial hemorrhage or evidence of cortically based acute
infarction.  Gray-white matter differentiation is within normal
limits throughout the brain.  No suspicious intracranial vascular
hyperdensity.
IMPRESSION: Normal noncontrast CT appearance of the brain.

## 2010-06-12 ENCOUNTER — Emergency Department (HOSPITAL_COMMUNITY): Admission: EM | Admit: 2010-06-12 | Discharge: 2010-06-12 | Payer: Self-pay | Admitting: Emergency Medicine

## 2010-06-12 IMAGING — CR DG CHEST 2V
2 series · 2 of 2 positions shown · non-contrast
Comparison: [DATE]

CLINICAL DATA: Chest pain, shortness of breath

CHEST - 2 VIEW

[view not recorded (1 of 2)]
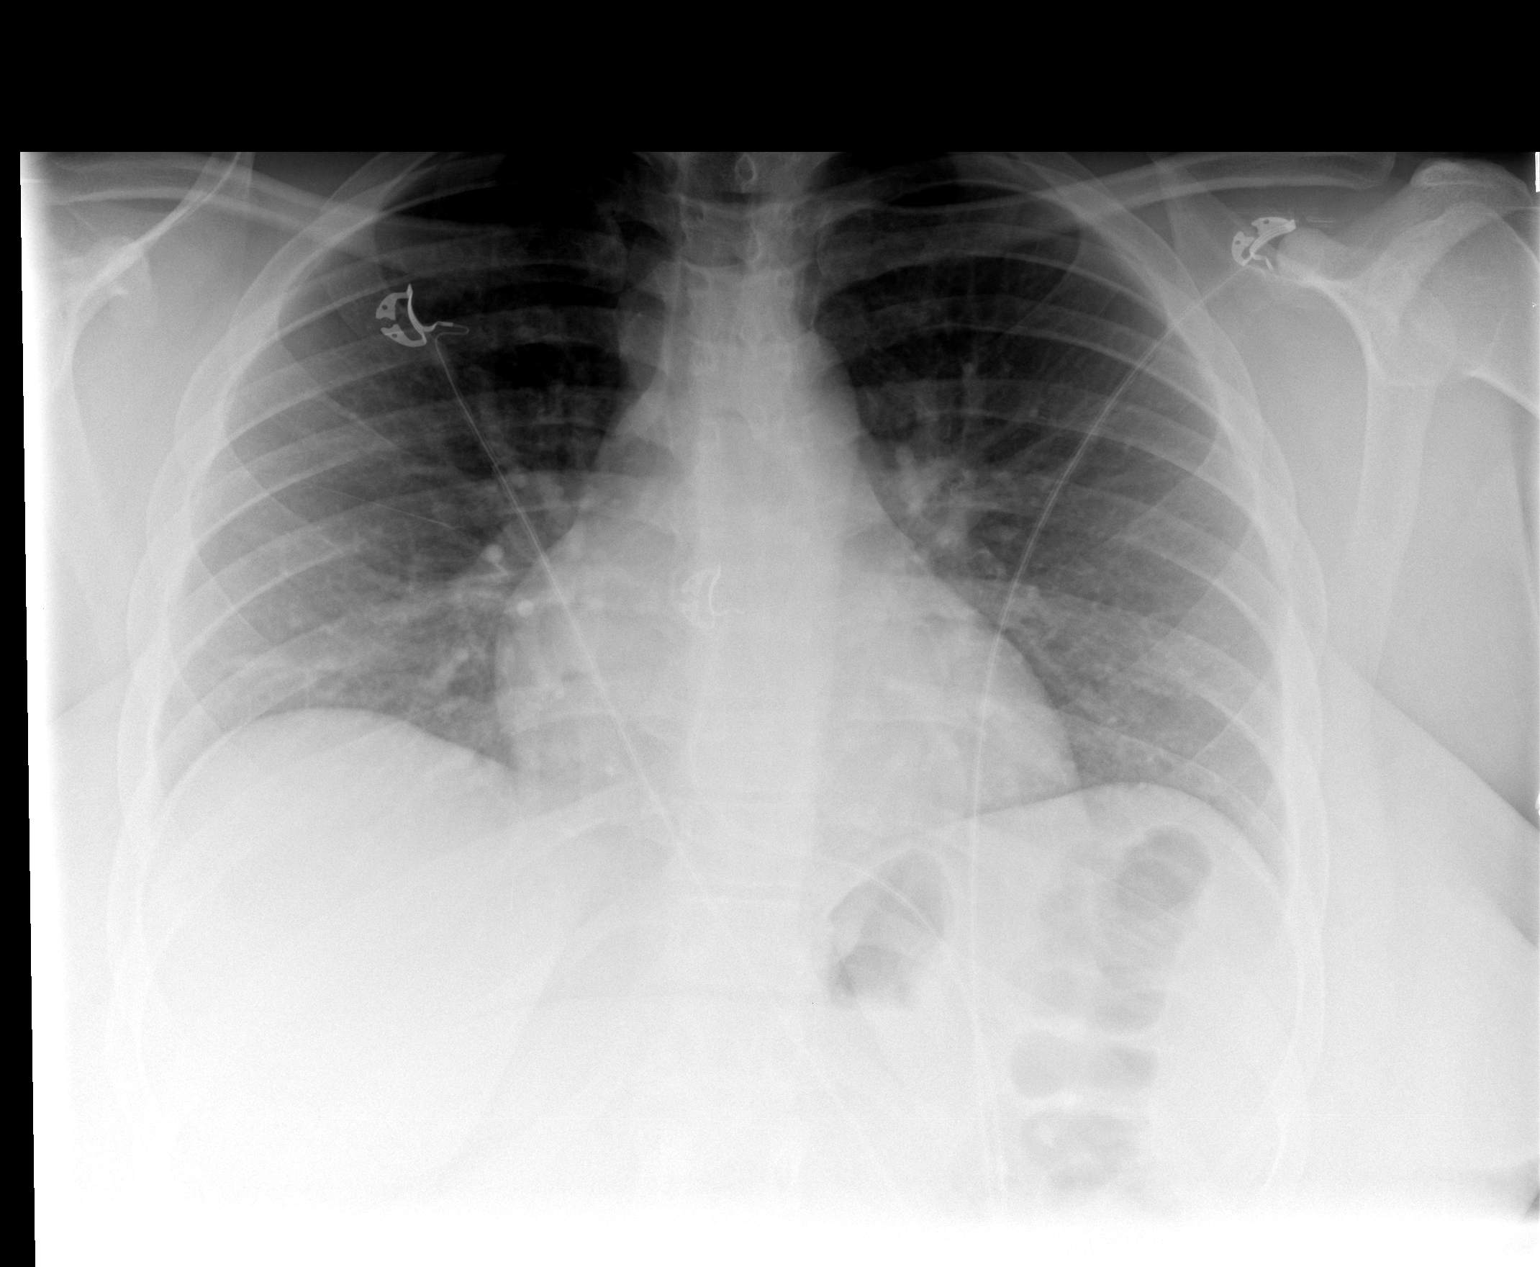

[view not recorded (2 of 2)]
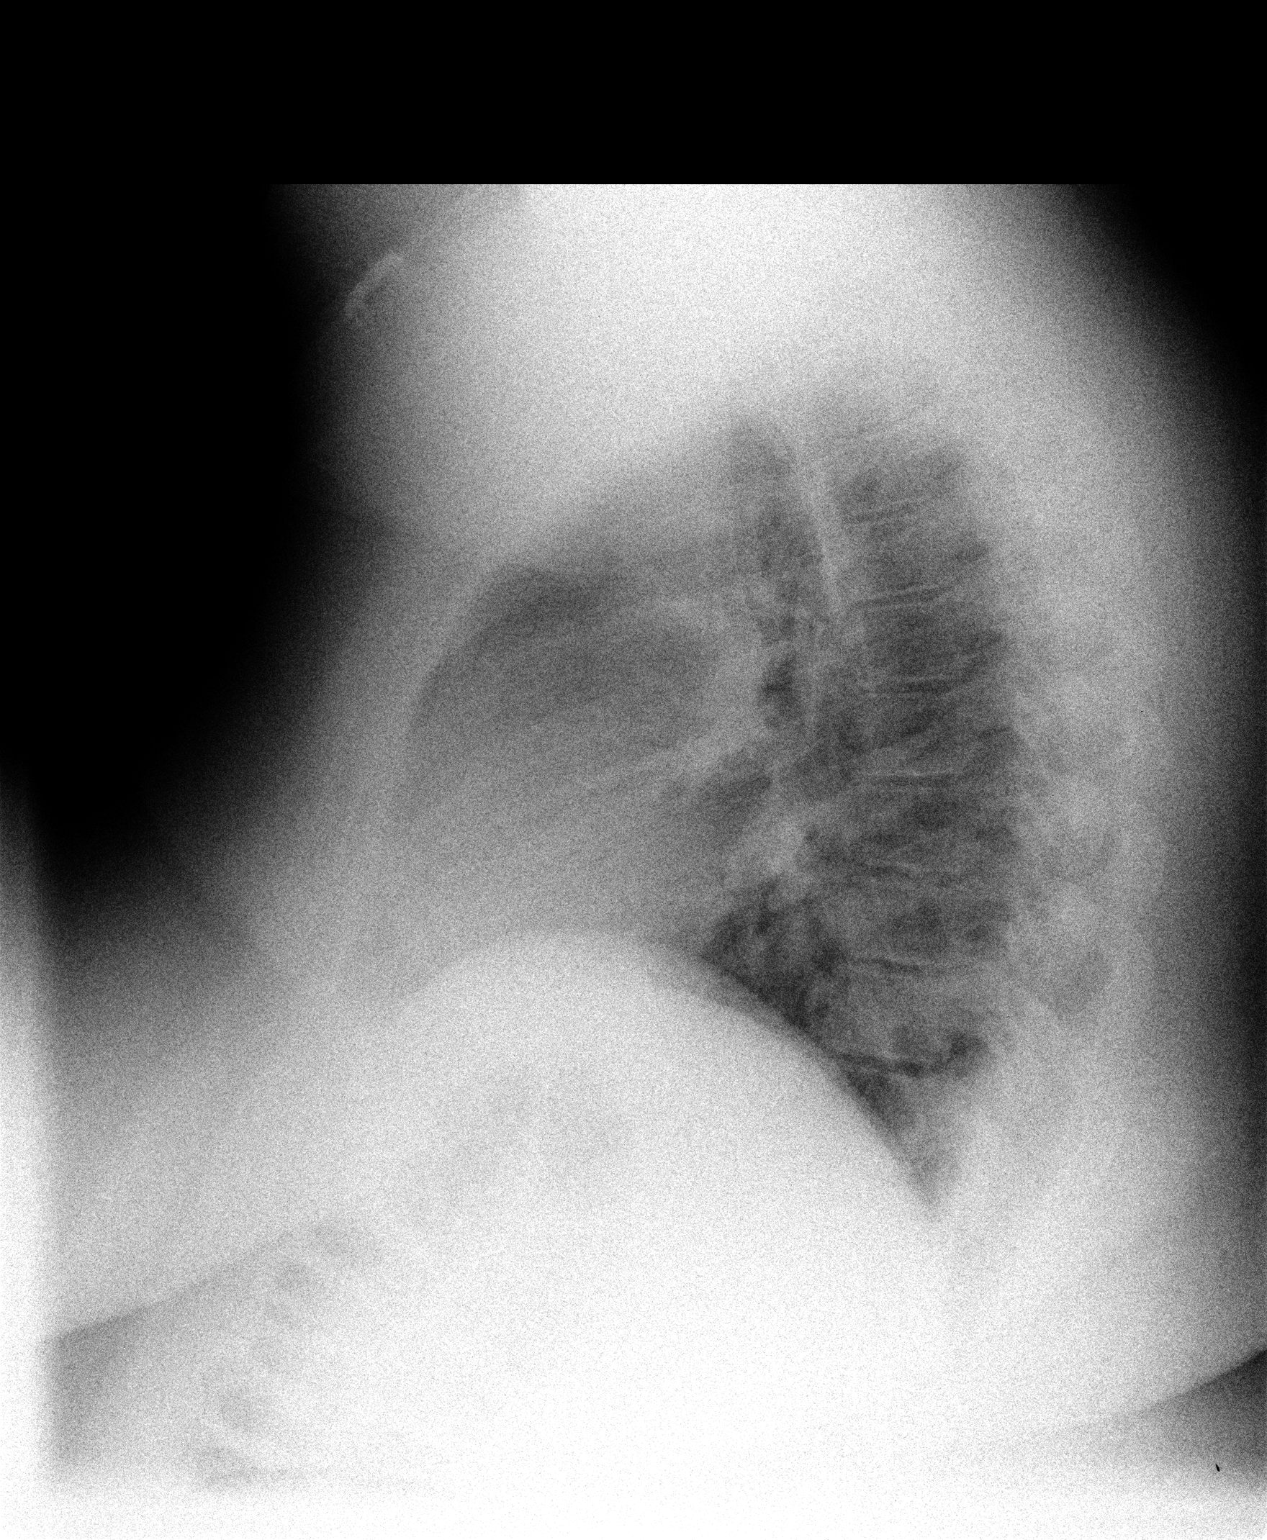

[2 of 2 positions shown; findings below may reference images not displayed]

FINDINGS: Quantum mottling on lateral view.
I opted to not repeat this view, to minimize radiation exposure to
a young patient.  Normal heart size, mediastinal contours, and
pulmonary vascularity.
Minimal right basilar atelectasis.
Generally low lung volumes.
Lungs otherwise clear.
No gross pleural effusion or pneumothorax.
Osseous structures unremarkable.
IMPRESSION: Low lung volumes with minimal right base atelectasis.

## 2010-06-12 IMAGING — CT CT ANGIO CHEST
1 of 6 series · 5 of 36 positions shown · IV contrast (Omnipaque 300)
Comparison: [DATE]

CLINICAL DATA: Left chest pain, shortness of breath, elevated D-
dimer

CT ANGIOGRAPHY CHEST WITH CONTRAST
TECHNIQUE: Multidetector CT imaging of the chest was performed
using the standard protocol during bolus administration of
intravenous contrast.  Multiplanar CT image reconstructions
including MIPs were obtained to evaluate the vascular anatomy.
Breast shield utilized.
Contrast:  120 ml Omnipaque 350 IV

[Series 6: pe 3.0 b40f · axial · 0.65mm/px · z∈[-231,-78]mm · 5 of 77 slices shown]
[im 13/77  lung]
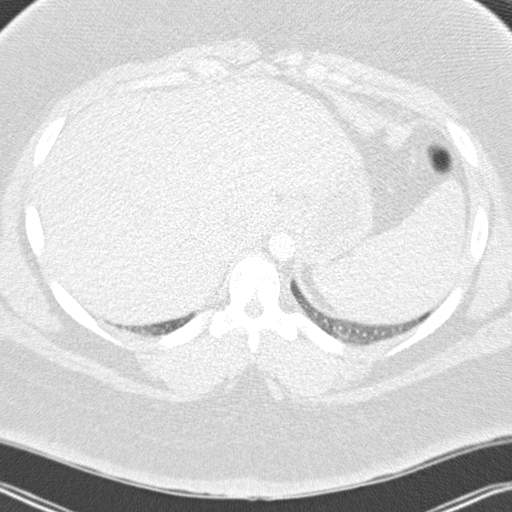
[im 26/77  mediastinal]
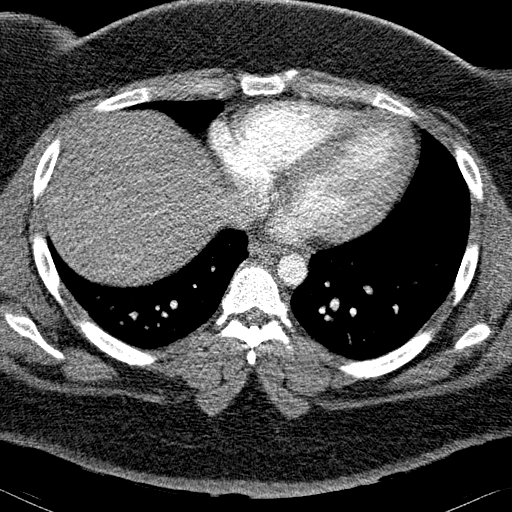
[im 39/77  lung]
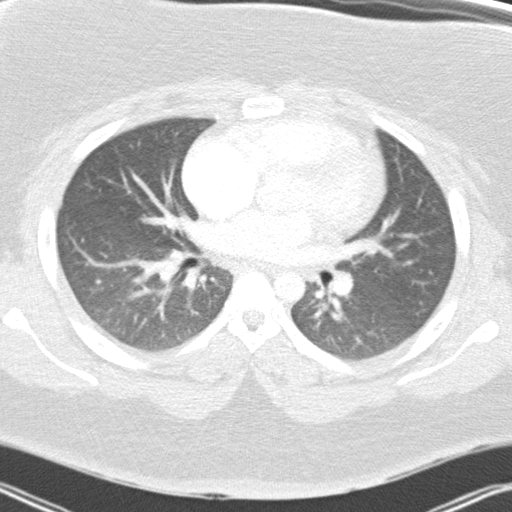
[im 51/77  mediastinal]
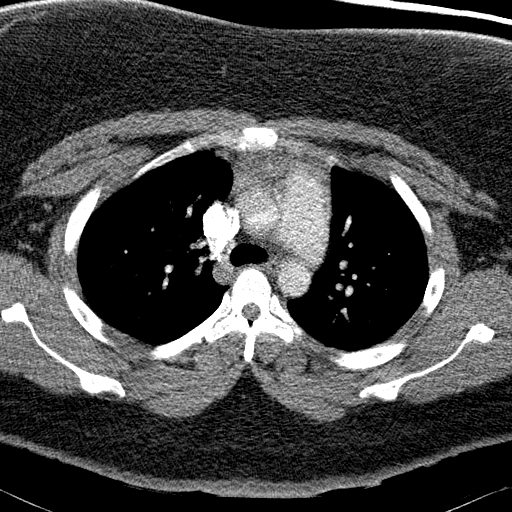
[im 64/77  lung]
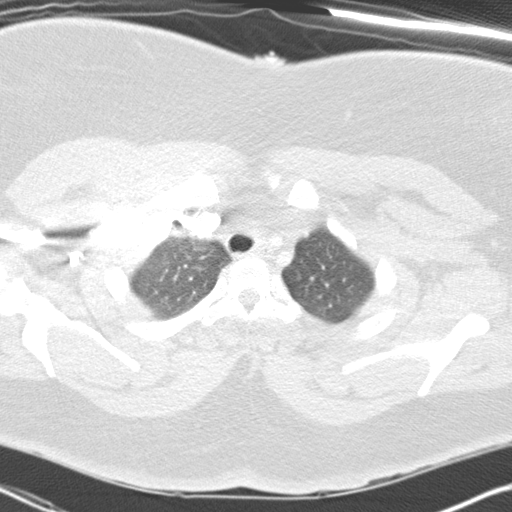

[5 of 36 positions shown; findings below may reference images not displayed]

FINDINGS: Suboptimal assessment of pulmonary arterial system due to
inadequate contrast bolus.
Scattered beam hardening artifacts secondary to patient size.
Aorta normal caliber without aneurysm or dissection.
Nondiagnostic exam for presence of pulmonary emboli.
Due to the volume of contrast already administered, recommend
radionuclide ventilation perfusion lung scan rather than repeat
injection of IV contrast for repeat CTA imaging.
Visualized portion of upper abdomen unremarkable.
No gross thoracic adenopathy.
Lungs grossly clear.
No acute osseous findings.

Review of the MIP images confirms the above findings.
IMPRESSION: Nondiagnostic exam for presence of pulmonary emboli due to
inadequate contrast bolus.
Recommend radionuclide lung scan for further assessment.
No gross acute thoracic abnormalities identified.
Findings discussed with Dr. FAUSTO at conclusion of imaging.

## 2010-06-12 IMAGING — NM NM PULM PERFUSION & VENT (REBREATHING & WASHOUT)
2 series · 12 of 12 positions shown · non-contrast
Comparison: I have chest radiograph [DATE]

CLINICAL DATA: Chest pain.  Concern for pulmonary embolism.
Shortness of breath

NUCLEAR MEDICINE VENTILATION - PERFUSION LUNG SCAN
TECHNIQUE: Wash-in, equilibrium, and wash-out phase ventilation
images were obtained using [WV] gas.  Perfusion images were
obtained in multiple projections after intravenous injection of Tc-
99m MAA.
Radiopharmaceuticals:  10.3 mCi [WV] gas and 5.0 mCi [WV] MAA.

[lung vq · 3.20mm/px · 6 of 16 frames shown (1 of 2)]
[frame 2/16]
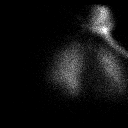
[frame 4/16]
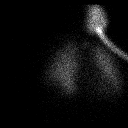
[frame 7/16]
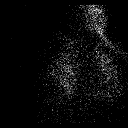
[frame 10/16]
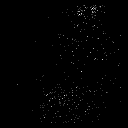
[frame 12/16]
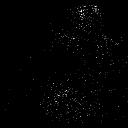
[frame 15/16]
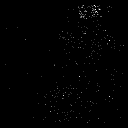

[lung vq · 3.20mm/px · 6 of 16 frames shown (2 of 2)]
[frame 2/16  full-range]
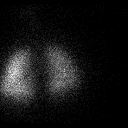
[frame 4/16  full-range]
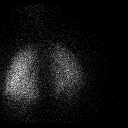
[frame 7/16  full-range]
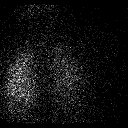
[frame 10/16]
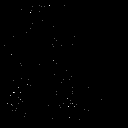
[frame 12/16]
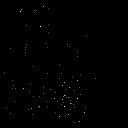
[frame 15/16]
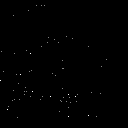

[12 of 12 positions shown; findings below may reference images not displayed]

FINDINGS: Ventilation:  No focal ventilation defect.  Not significant air
trapping.

Perfusion:  No wedge shaped peripheral perfusion defects to suggest
acut pulmonary embolism
IMPRESSION: Normal ventilation and perfusion exam.  Negative for pulmonary
embolism.

## 2010-07-12 ENCOUNTER — Emergency Department (HOSPITAL_COMMUNITY)
Admission: EM | Admit: 2010-07-12 | Discharge: 2010-07-12 | Payer: Self-pay | Source: Home / Self Care | Admitting: Emergency Medicine

## 2010-07-12 IMAGING — CR DG CHEST 2V
2 series · 2 of 2 positions shown · non-contrast
Comparison: [DATE].

CLINICAL DATA: Left chest pain for 7 days.

CHEST - 2 VIEW

[w chest pa]
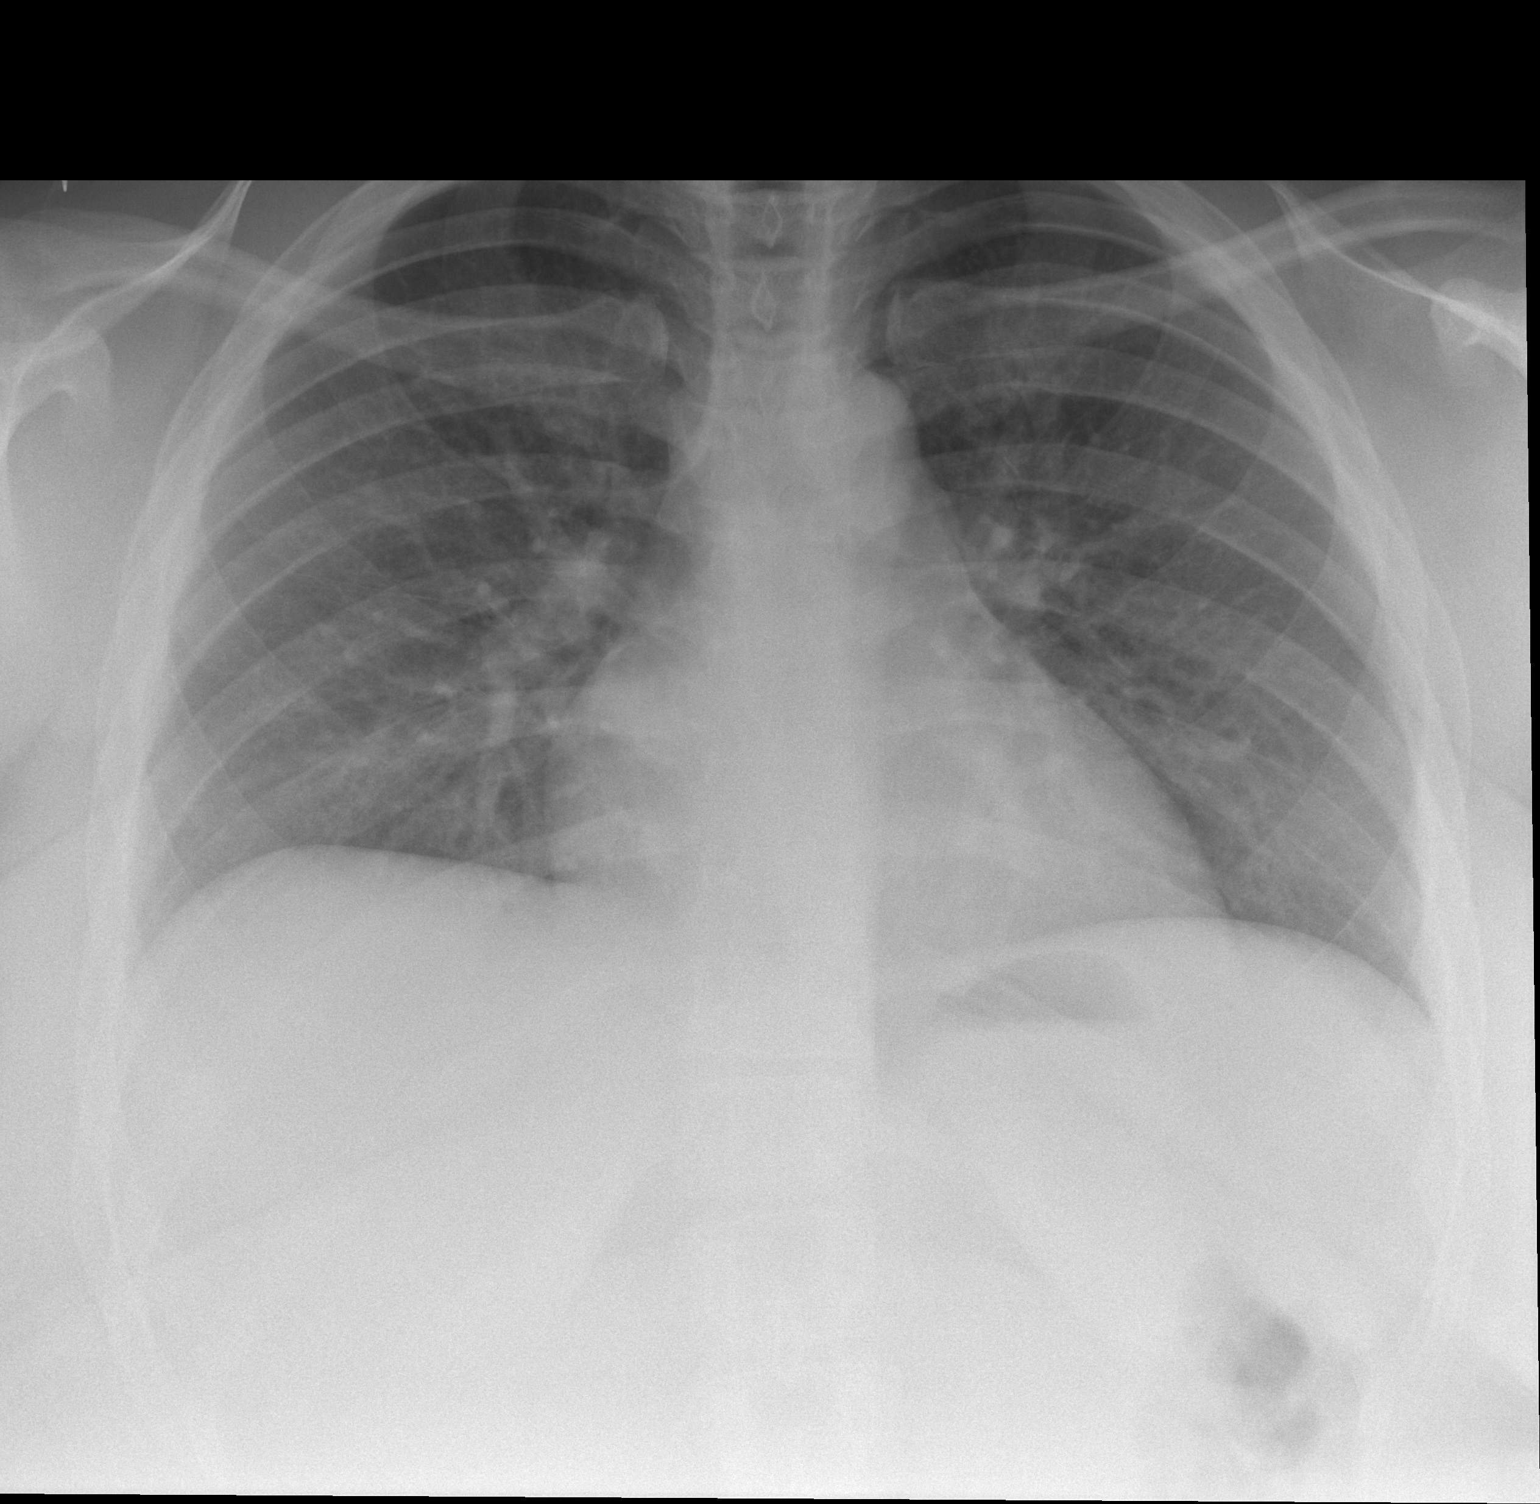

[w chest lat *]
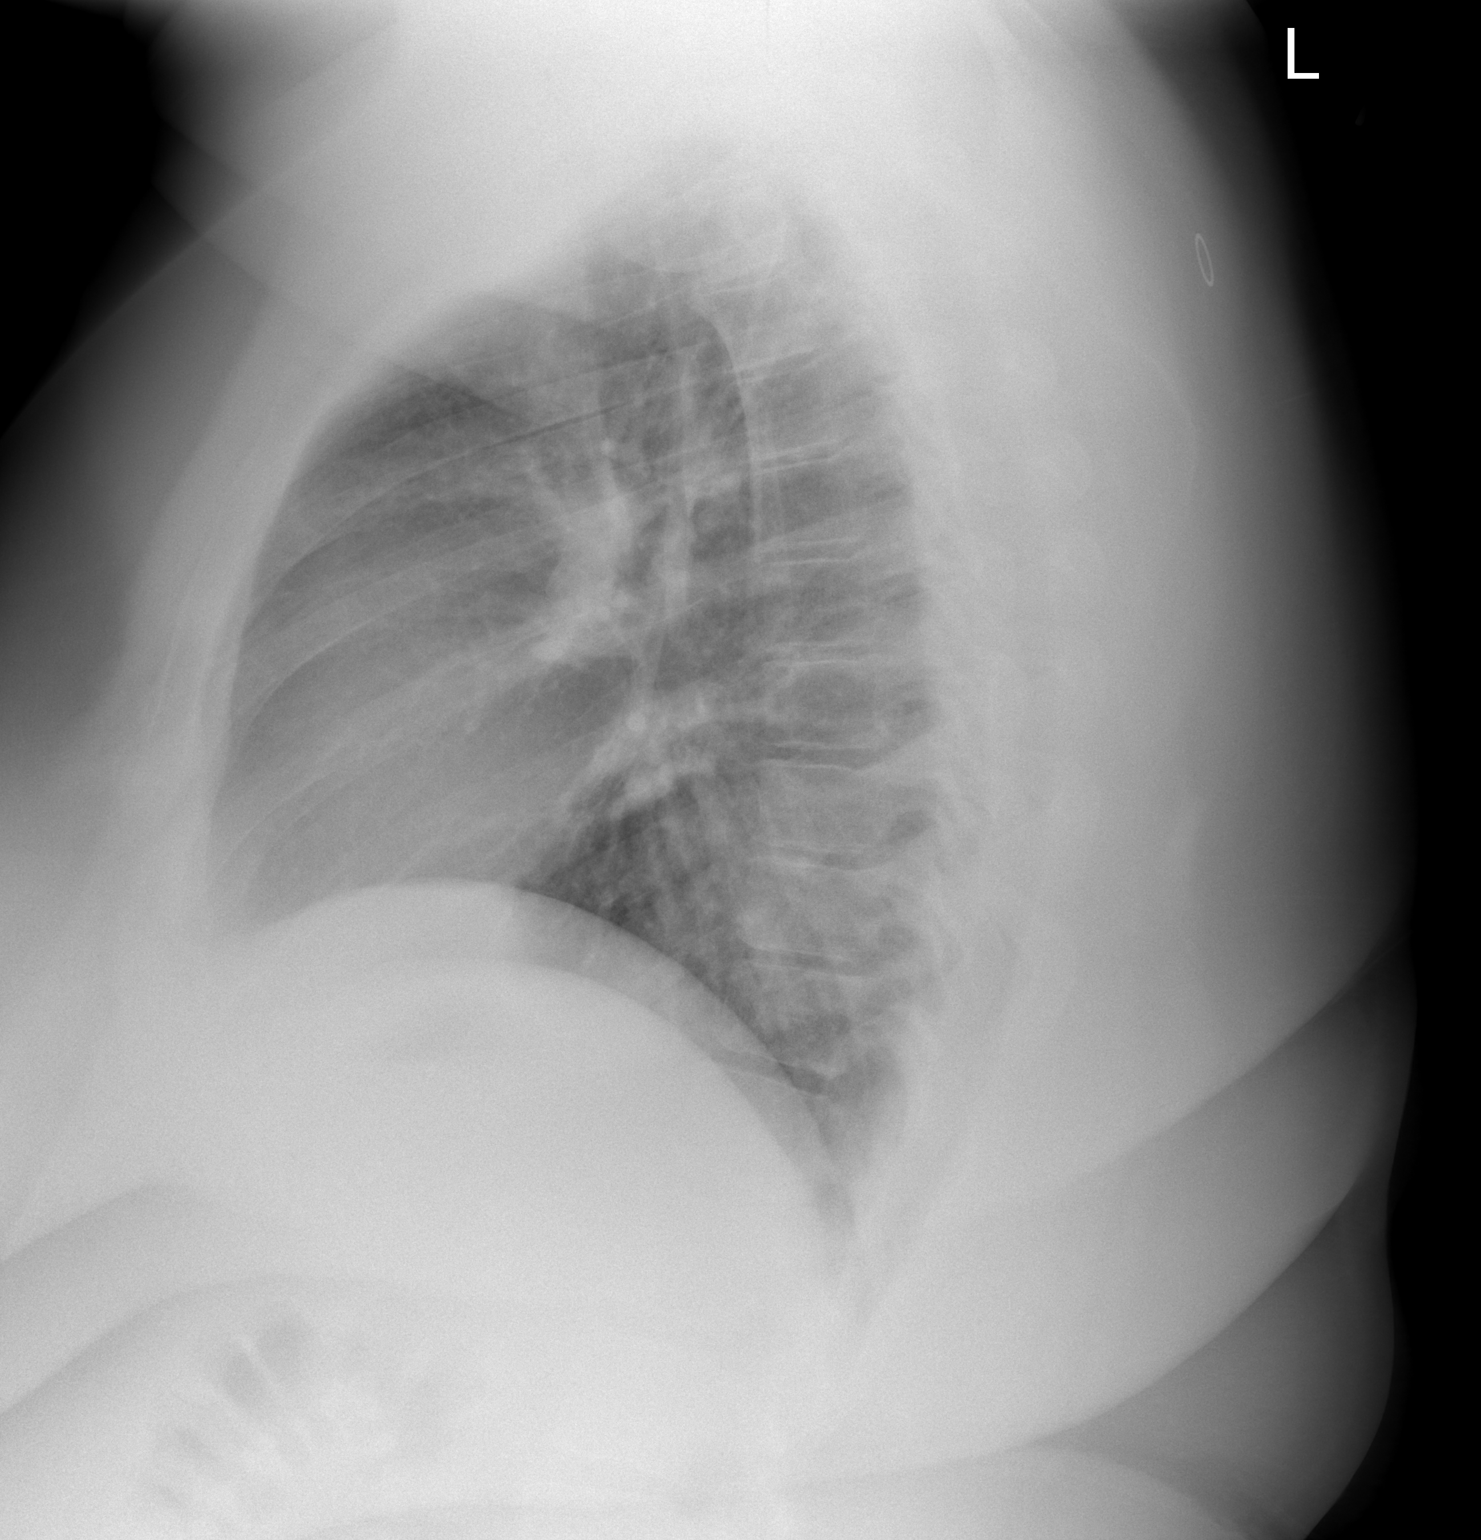

[2 of 2 positions shown; findings below may reference images not displayed]

FINDINGS: The heart size and mediastinal contours are stable.
There are chronic low lung volumes with mild basilar atelectasis.
No edema, confluent airspace opacity or pleural effusion is
present.
IMPRESSION: Stable examination.  No acute cardiopulmonary process.

## 2010-07-21 ENCOUNTER — Emergency Department (HOSPITAL_COMMUNITY)
Admission: EM | Admit: 2010-07-21 | Discharge: 2010-07-21 | Payer: Self-pay | Source: Home / Self Care | Admitting: Emergency Medicine

## 2010-08-19 ENCOUNTER — Encounter: Payer: Self-pay | Admitting: *Deleted

## 2010-08-20 ENCOUNTER — Encounter: Payer: Self-pay | Admitting: Internal Medicine

## 2010-08-20 ENCOUNTER — Encounter: Payer: Self-pay | Admitting: Emergency Medicine

## 2010-10-08 LAB — URINALYSIS, ROUTINE W REFLEX MICROSCOPIC
Nitrite: NEGATIVE
Specific Gravity, Urine: 1.025 (ref 1.005–1.030)
pH: 6.5 (ref 5.0–8.0)

## 2010-10-08 LAB — URINE MICROSCOPIC-ADD ON

## 2010-10-09 LAB — BASIC METABOLIC PANEL
CO2: 26 mEq/L (ref 19–32)
Calcium: 9.2 mg/dL (ref 8.4–10.5)
Creatinine, Ser: 0.64 mg/dL (ref 0.4–1.2)
Glucose, Bld: 105 mg/dL — ABNORMAL HIGH (ref 70–99)

## 2010-10-09 LAB — POCT CARDIAC MARKERS: Troponin i, poc: <0.05 ng/mL (ref 0.00–0.09)

## 2010-10-11 LAB — GLUCOSE, CAPILLARY: Glucose-Capillary: 102 mg/dL — ABNORMAL HIGH (ref 70–99)

## 2010-10-13 LAB — DIFFERENTIAL
Lymphocytes Relative: 36 % (ref 12–46)
Lymphs Abs: 2.3 10*3/uL (ref 0.7–4.0)
Monocytes Relative: 7 % (ref 3–12)
Neutro Abs: 3.6 10*3/uL (ref 1.7–7.7)
Neutrophils Relative %: 55 % (ref 43–77)

## 2010-10-13 LAB — BASIC METABOLIC PANEL
Calcium: 8.7 mg/dL (ref 8.4–10.5)
GFR calc Af Amer: >60 mL/min (ref >60)
GFR calc non Af Amer: >60 mL/min (ref >60)
Potassium: 3.7 mEq/L (ref 3.5–5.1)
Sodium: 137 mEq/L (ref 135–145)

## 2010-10-13 LAB — CBC
HCT: 35.3 % — ABNORMAL LOW (ref 36.0–46.0)
Hemoglobin: 11.8 g/dL — ABNORMAL LOW (ref 12.0–15.0)
RBC: 4.26 MIL/uL (ref 3.87–5.11)

## 2010-10-14 LAB — WET PREP, GENITAL
Clue Cells Wet Prep HPF POC: NONE SEEN
Trich, Wet Prep: NONE SEEN
WBC, Wet Prep HPF POC: NONE SEEN
Yeast Wet Prep HPF POC: NONE SEEN

## 2010-10-14 LAB — URINALYSIS, ROUTINE W REFLEX MICROSCOPIC
Bilirubin Urine: NEGATIVE
Hgb urine dipstick: NEGATIVE
Ketones, ur: NEGATIVE mg/dL
Protein, ur: NEGATIVE mg/dL
Specific Gravity, Urine: 1.02 (ref 1.005–1.030)
Urobilinogen, UA: 0.2 mg/dL (ref 0.0–1.0)

## 2010-10-14 LAB — GC/CHLAMYDIA PROBE AMP, GENITAL: GC Probe Amp, Genital: NEGATIVE

## 2010-11-01 LAB — DIFFERENTIAL
Basophils Absolute: 0.1 10*3/uL (ref 0.0–0.1)
Eosinophils Absolute: 0.1 10*3/uL (ref 0.0–0.7)
Lymphocytes Relative: 39 % (ref 12–46)
Neutrophils Relative %: 50 % (ref 43–77)

## 2010-11-01 LAB — COMPREHENSIVE METABOLIC PANEL
Alkaline Phosphatase: 77 U/L (ref 39–117)
BUN: 10 mg/dL (ref 6–23)
Chloride: 108 mEq/L (ref 96–112)
GFR calc non Af Amer: >60 mL/min (ref >60)
Glucose, Bld: 93 mg/dL (ref 70–99)
Potassium: 3.7 mEq/L (ref 3.5–5.1)
Total Bilirubin: 0.8 mg/dL (ref 0.3–1.2)

## 2010-11-01 LAB — CBC
HCT: 34.7 % — ABNORMAL LOW (ref 36.0–46.0)
Hemoglobin: 11.7 g/dL — ABNORMAL LOW (ref 12.0–15.0)
RDW: 13.8 % (ref 11.5–15.5)

## 2010-11-01 LAB — URINALYSIS, ROUTINE W REFLEX MICROSCOPIC
Glucose, UA: NEGATIVE mg/dL
Ketones, ur: NEGATIVE mg/dL
pH: 6.5 (ref 5.0–8.0)

## 2010-11-02 LAB — COMPREHENSIVE METABOLIC PANEL
ALT: 15 U/L (ref 0–35)
AST: 19 U/L (ref 0–37)
Alkaline Phosphatase: 72 U/L (ref 39–117)
Alkaline Phosphatase: 78 U/L (ref 39–117)
CO2: 26 mEq/L (ref 19–32)
CO2: 29 mEq/L (ref 19–32)
Chloride: 107 mEq/L (ref 96–112)
Chloride: 109 mEq/L (ref 96–112)
Creatinine, Ser: 0.57 mg/dL (ref 0.4–1.2)
GFR calc Af Amer: >60 mL/min (ref >60)
GFR calc non Af Amer: >60 mL/min (ref >60)
GFR calc non Af Amer: >60 mL/min (ref >60)
Glucose, Bld: 96 mg/dL (ref 70–99)
Potassium: 3.5 mEq/L (ref 3.5–5.1)
Potassium: 3.6 mEq/L (ref 3.5–5.1)
Sodium: 141 mEq/L (ref 135–145)
Total Bilirubin: 0.7 mg/dL (ref 0.3–1.2)
Total Bilirubin: 1 mg/dL (ref 0.3–1.2)

## 2010-11-02 LAB — BASIC METABOLIC PANEL
CO2: 28 mEq/L (ref 19–32)
Calcium: 8.4 mg/dL (ref 8.4–10.5)
Creatinine, Ser: 0.72 mg/dL (ref 0.4–1.2)
GFR calc Af Amer: >60 mL/min (ref >60)
GFR calc non Af Amer: >60 mL/min (ref >60)

## 2010-11-02 LAB — CBC
HCT: 31 % — ABNORMAL LOW (ref 36.0–46.0)
Hemoglobin: 10.7 g/dL — ABNORMAL LOW (ref 12.0–15.0)
MCHC: 34.3 g/dL (ref 30.0–36.0)
MCV: 85.7 fL (ref 78.0–100.0)
RBC: 3.44 MIL/uL — ABNORMAL LOW (ref 3.87–5.11)
RBC: 3.56 MIL/uL — ABNORMAL LOW (ref 3.87–5.11)
RBC: 3.62 MIL/uL — ABNORMAL LOW (ref 3.87–5.11)
RDW: 13.6 % (ref 11.5–15.5)
WBC: 6.8 10*3/uL (ref 4.0–10.5)

## 2010-11-02 LAB — DIFFERENTIAL
Basophils Absolute: 0 10*3/uL (ref 0.0–0.1)
Basophils Absolute: 0 10*3/uL (ref 0.0–0.1)
Basophils Relative: 0 % (ref 0–1)
Basophils Relative: 0 % (ref 0–1)
Basophils Relative: 1 % (ref 0–1)
Eosinophils Absolute: 0.1 10*3/uL (ref 0.0–0.7)
Eosinophils Absolute: 0.1 10*3/uL (ref 0.0–0.7)
Eosinophils Relative: 1 % (ref 0–5)
Lymphocytes Relative: 36 % (ref 12–46)
Lymphocytes Relative: 58 % — ABNORMAL HIGH (ref 12–46)
Monocytes Absolute: 0.5 10*3/uL (ref 0.1–1.0)
Monocytes Relative: 7 % (ref 3–12)
Monocytes Relative: 7 % (ref 3–12)
Neutro Abs: 1.7 10*3/uL (ref 1.7–7.7)
Neutrophils Relative %: 33 % — ABNORMAL LOW (ref 43–77)
Neutrophils Relative %: 39 % — ABNORMAL LOW (ref 43–77)

## 2010-11-02 LAB — URINE MICROSCOPIC-ADD ON

## 2010-11-02 LAB — URINALYSIS, ROUTINE W REFLEX MICROSCOPIC
Bilirubin Urine: NEGATIVE
Urobilinogen, UA: 0.2 mg/dL (ref 0.0–1.0)

## 2010-11-02 LAB — GLUCOSE, CAPILLARY: Glucose-Capillary: 102 mg/dL — ABNORMAL HIGH (ref 70–99)

## 2010-11-02 LAB — TSH: TSH: 2.03 u[IU]/mL (ref 0.350–4.500)

## 2010-11-02 LAB — FERRITIN: Ferritin: 45 ng/mL (ref 10–291)

## 2010-11-02 LAB — LIPASE, BLOOD: Lipase: 14 U/L (ref 11–59)

## 2010-11-12 LAB — COMPREHENSIVE METABOLIC PANEL
ALT: 22 U/L (ref 0–35)
AST: 22 U/L (ref 0–37)
CO2: 27 mEq/L (ref 19–32)
Calcium: 9 mg/dL (ref 8.4–10.5)
GFR calc Af Amer: >60 mL/min (ref >60)
GFR calc non Af Amer: >60 mL/min (ref >60)
Sodium: 138 mEq/L (ref 135–145)

## 2010-11-12 LAB — DIFFERENTIAL
Eosinophils Absolute: 0.1 10*3/uL (ref 0.0–0.7)
Eosinophils Relative: 2 % (ref 0–5)
Lymphs Abs: 2.7 10*3/uL (ref 0.7–4.0)
Monocytes Relative: 7 % (ref 3–12)

## 2010-11-12 LAB — CBC
MCHC: 33.3 g/dL (ref 30.0–36.0)
RBC: 4.47 MIL/uL (ref 3.87–5.11)
WBC: 6.3 10*3/uL (ref 4.0–10.5)

## 2010-12-14 NOTE — H&P (Signed)
NAMECOUNTESS, BIEBEL NO.:  [id number]   MEDICAL RECORD NO.:  [id number]          PATIENT TYPE:  EMS   LOCATION:  ED                            FACILITY:  [facility]   PHYSICIAN:  Ky Barban, M.D.DATE OF BIRTH:  1985-12-13   DATE OF ADMISSION:  11/24/2004  DATE OF DISCHARGE:  04/29/2006LH                                HISTORY & PHYSICAL   CHIEF COMPLAINT:  Recurrent right renal colic.   HISTORY:  A 25 year old female who is having pain in the right flank on and  off for the last one week.  Last Wednesday she went to [facility]  Emergency Room, where a CT scan was done.  It showed that there is a 3.5 mm  and 6 mm stone in the distal right ureter causing right hydroureter  nephrosis.  The second time she went to [facility] yesterday.  She  was still having pain and wants something done about it.  I went over the  procedure of stone basket.  Other options were discussed also but I  recommended a stone basket.  The procedure, its limitations and  complications, especially ureter perforation leading to open surgery.  The  mother understands.  She understands and wants me to go ahead and schedule  it.   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   FAMILY HISTORY:  Her sister has kidney stones.   PERSONAL HISTORY:  She does not smoke or drink.   REVIEW OF SYSTEMS:  Unremarkable.   PHYSICAL EXAMINATION:  GENERAL:  Morbidly obese.  VITAL SIGNS:  Blood pressure 120/80, temperature 96.2.  ABDOMEN:  Soft, flat.  Liver, kidneys, and spleen are not palpable.  CENTRAL NERVOUS SYSTEM:  Negative.  HEENT:  Negative.  HEART:  Regular sinus rhythm.  PELVIC:  Deferred.  EXTREMITIES:  Normal.   IMPRESSION:  Right ureteral calculi.   PLAN:  Cystoscopy with right retrograde pyelogram.  Ureteral stone basket.  Holmium laser lithotripsy and the use of a double J  stent.      MIJ/MEDQ  D:  11/26/2004  T:  11/26/2004  Job:  [id number]

## 2010-12-14 NOTE — Op Note (Signed)
NAME:  Nancy [name], Nancy [name]           ACCOUNT NO.:  [id number]   MEDICAL RECORD NO.:  [id number]          PATIENT TYPE:  AMB   LOCATION:  DAY                           FACILITY:  [facility]   PHYSICIAN:  Ky Barban, M.D.DATE OF BIRTH:  16-Jun-1986   DATE OF PROCEDURE:  11/30/2004  DATE OF DISCHARGE:                                 OPERATIVE REPORT   PREOPERATIVE DIAGNOSIS:  Right ureteral calculi.   POSTOPERATIVE DIAGNOSIS:  Normal examination.   PROCEDURE:  Patient under general endotracheal anesthesia in lithotomy  position, after the usual prep and drape, a #25 cystoscope introduced into  the bladder.  It is inspected, looks normal.  Right ureteral orifice  catheterized with a wedge catheter.  Hypaque is injected.  The dye goes up  into the upper ureter, renal pelvis and calyces.  They look normal.  The  lower ureter is slightly dilated.  Do not see any definite stone, filling  defect.  At this point I decided to put a guidewire and look into the ureter  because had __________  two stones I want to make sure there are no stones,  so the guidewire is passed up into the renal pelvis over the guidewire.  The  short rigid ureteroscope is introduced into the lower ureter.  There is no  stone.  The ureter looks normal, so all the instruments and the guidewire  are removed.  The patient left the operating room in satisfactory condition.      MIJ/MEDQ  D:  11/30/2004  T:  11/30/2004  Job:  [id number]

## 2011-04-26 LAB — URINALYSIS, ROUTINE W REFLEX MICROSCOPIC
Bilirubin Urine: NEGATIVE
Glucose, UA: NEGATIVE
Hgb urine dipstick: NEGATIVE
Ketones, ur: NEGATIVE
pH: 6

## 2011-04-26 LAB — PREGNANCY, URINE: Preg Test, Ur: NEGATIVE

## 2011-05-07 LAB — CBC
MCV: 83.4
Platelets: 256
RDW: 13.6
WBC: 4.8

## 2011-05-07 LAB — COMPREHENSIVE METABOLIC PANEL
AST: 22
Albumin: 3.7
Chloride: 104
Creatinine, Ser: 0.72
GFR calc Af Amer: >60
Potassium: 4.6
Total Bilirubin: 1.3 — ABNORMAL HIGH
Total Protein: 8

## 2011-05-07 LAB — URINE MICROSCOPIC-ADD ON

## 2011-05-07 LAB — URINALYSIS, ROUTINE W REFLEX MICROSCOPIC
Glucose, UA: NEGATIVE
Specific Gravity, Urine: 1.02
pH: 7

## 2011-05-07 LAB — DIFFERENTIAL
Basophils Absolute: 0
Eosinophils Relative: 1
Lymphocytes Relative: 42
Monocytes Absolute: 0.5
Monocytes Relative: 10

## 2011-05-10 LAB — DIFFERENTIAL
Basophils Absolute: 0
Eosinophils Relative: 2
Lymphocytes Relative: 44
Lymphs Abs: 2.4
Neutro Abs: 2.5
Neutrophils Relative %: 45

## 2011-05-10 LAB — CBC
Platelets: 232
RDW: 14.9 — ABNORMAL HIGH
WBC: 5.5

## 2011-05-10 LAB — WET PREP, GENITAL
Trich, Wet Prep: NONE SEEN
WBC, Wet Prep HPF POC: NONE SEEN
Yeast Wet Prep HPF POC: NONE SEEN

## 2011-05-10 LAB — URINE MICROSCOPIC-ADD ON

## 2011-05-10 LAB — URINALYSIS, ROUTINE W REFLEX MICROSCOPIC
Glucose, UA: NEGATIVE
Hgb urine dipstick: NEGATIVE
Leukocytes, UA: NEGATIVE
Protein, ur: NEGATIVE
Specific Gravity, Urine: 1.015
pH: 7.5

## 2011-09-04 ENCOUNTER — Ambulatory Visit (HOSPITAL_COMMUNITY)
Admission: RE | Admit: 2011-09-04 | Discharge: 2011-09-04 | Disposition: A | Discharge status: Home / Self Care | Payer: BC Managed Care – PPO | Source: Ambulatory Visit | Attending: Family Medicine | Admitting: Family Medicine

## 2011-09-04 DIAGNOSIS — R29898 Other symptoms and signs involving the musculoskeletal system: Secondary | ICD-10-CM | POA: Insufficient documentation

## 2011-09-04 DIAGNOSIS — G8220 Paraplegia, unspecified: Secondary | ICD-10-CM | POA: Insufficient documentation

## 2011-09-04 DIAGNOSIS — R262 Difficulty in walking, not elsewhere classified: Secondary | ICD-10-CM | POA: Insufficient documentation

## 2011-09-04 DIAGNOSIS — M6281 Muscle weakness (generalized): Secondary | ICD-10-CM | POA: Insufficient documentation

## 2011-09-04 DIAGNOSIS — IMO0001 Reserved for inherently not codable concepts without codable children: Secondary | ICD-10-CM | POA: Insufficient documentation

## 2011-09-04 DIAGNOSIS — M79609 Pain in unspecified limb: Secondary | ICD-10-CM | POA: Insufficient documentation

## 2011-09-04 NOTE — Evaluation (Signed)
Physical Therapy Evaluation  Patient Details  Name: Nancy [name] MRN: [id number] Date of Birth: 1986/03/30  Today's Date: 09/04/2011 Time: 1355-1440 Time Calculation (min): 45 min  Visit#: 1  of 24   Re-eval: 10/04/11 Assessment Diagnosis: LE paralysis Next MD Visit: 09/20/2011 Prior Therapy: SNF  Past Medical History: No past medical history on file. Past Surgical History: No past surgical history on file.  Subjective Symptoms/Limitations Symptoms: Pt states that on the 5th of January her legs gave way on her.  For about a week she had no feeling in them.  The feeling gradually came back but she was unable to walk.  She left the hospital and was in rehab for twenty one days and was discharged on 08/29/2011.  She is currently living with her sister she hass an amputee so her house is handicap accessible.  She states since she has been at her sisters she has been trying to walk but has been falling about four times a day.  The patient  has been doing exercises at home.  She is now being referred to PT to improve her balance and strength to allow her to return to her prio leverl of functioning. How long can you sit comfortably?: no problem How long can you stand comfortably?: unable to stand for greater than five seconds without UE support. How long can you walk comfortably?: The patient was only able to walk for 22 feet with rolling walker and SBA for safety. Pain Assessment Currently in Pain?: Yes Pain Score:   7 Pain Location: Leg Pain Type: Chronic pain Pain Onset: 1 to 4 weeks ago Pain Frequency: Constant  Precautions/Restrictions  Precautions Precautions: Fall  Prior Functioning  Home Living Lives With: Other (Comment) (sister) Type of Home: House Home Layout: One level Home Access: Ramped entrance Prior Function Vocation: Full time employment Vocation Requirements: call center. Leisure: Hobbies-yes (Comment) Comments: sing at church;  piano.  Cognition/Observation Cognition Overall Cognitive Status: Appears within functional limits for tasks assessed  Sensation/Coordination/Flexibility/Functional Tests    Assessment RLE Strength Right Hip Flexion: 2/5 Right Hip Extension: 2+/5 Right Hip ABduction: 2/5 Right Hip ADduction: 3-/5 Right Knee Flexion: 3+/5 Right Knee Extension: 3/5 Right Ankle Dorsiflexion: 3/5 LLE Strength Left Hip Flexion: 2/5 Left Hip Extension: 3-/5 Left Hip ABduction: 2+/5 Left Hip ADduction: 2/5 Left Knee Flexion: 3+/5 Left Knee Extension: 3+/5 Left Ankle Dorsiflexion: 3+/5  Exercise/Treatments Mobility/Balance  Static Standing Balance Static Standing - Balance Support: No upper extremity supported Static Standing - Level of Assistance: 5: Stand by assistance Static Standing - Comment/# of Minutes: 5 seconds     Supine Heel Slides: Both;10 reps Hip Adduction Isometric: Strengthening;Both;10 reps;Limitations Hip Adduction Isometric Limitations: ab/adduction  Bridges: 10 reps Straight Leg Raises: Strengthening;10 reps;Both (unable to lift leg greater than 6" off  the table.)  Prone  Hip Extension: Both;5 reps   Physical Therapy Assessment and Plan PT Assessment and Plan Clinical Impression Statement: 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    Goals Home Exercise Program Pt will Perform Home Exercise Program: Independently PT  Short Term Goals Time to  Complete Short Term Goals: 4 weeks PT Short Term Goal 1: Pt able to stand UE unsupported x 2 min PT Short Term Goal 2: Pt mm strength to improve one grade so that pt is not falling. PT Short Term Goal 3: Pt to state she has not fallen in the past two weeks PT Short Term Goal 4: Pt to be ambulating with the rollator in her sister's home on a daily basis without falling PT Short Term Goal 5: Pt balance to be good enough to complet the Berg balance test PT Long Term Goals Time to Complete Long Term Goals: 8 weeks PT Long Term Goal 1: I in advance HEP PT Long Term Goal 2: Pt strength to be 4+/5 to allow pt to ambulate with a cane inside her house Long Term Goal 3: Pt Berg balance to be increased to level where patient is safe walking with a cane. Long Term Goal 4: Pt pain to be no greater than a 3 to improve her quality of life. PT Long Term Goal 5: Pt to be at a point where she is able to move out of her sisters house and be safe.  Problem List Patient Active Problem List  Diagnoses  . Weakness of both legs  . Difficulty in walking    PT - End of Session Equipment Utilized During Treatment: Gait belt Activity Tolerance: Patient tolerated treatment well General Behavior During Session: [facility] for tasks performed Cognition: [facility] for tasks performed PT Plan of Care PT Home Exercise Plan: Given Consulted and Agree with Plan of Care: Patient   RUSSELL,CINDY 09/04/2011, 3:59 PM  Physician Documentation Your signature is required to indicate approval of the treatment plan as stated above.  Please sign and either send electronically or make a copy of this report for your files and return this physician signed original.   Please mark one 1.__approve of plan  2. ___approve of plan with the following conditions.   ______________________________                                                          _____________________ Physician Signature                                                                                                              Date

## 2011-09-10 ENCOUNTER — Telehealth (HOSPITAL_COMMUNITY): Payer: Self-pay

## 2011-09-10 ENCOUNTER — Ambulatory Visit (HOSPITAL_COMMUNITY): Payer: BC Managed Care – PPO | Admitting: *Deleted

## 2011-09-11 ENCOUNTER — Ambulatory Visit (HOSPITAL_COMMUNITY): Payer: BC Managed Care – PPO | Admitting: *Deleted

## 2011-09-12 ENCOUNTER — Ambulatory Visit (HOSPITAL_COMMUNITY): Payer: BC Managed Care – PPO | Admitting: *Deleted

## 2011-09-16 ENCOUNTER — Ambulatory Visit (HOSPITAL_COMMUNITY)
Admission: RE | Admit: 2011-09-16 | Discharge: 2011-09-16 | Disposition: A | Discharge status: Home / Self Care | Payer: BC Managed Care – PPO | Source: Ambulatory Visit

## 2011-09-16 NOTE — Progress Notes (Signed)
Physical Therapy Treatment Patient Details  Name: Nancy [name] MRN: [id number] Date of Birth: Jan 05, 1986  Today's Date: 09/16/2011 Time: 4696-2952 Time Calculation (min): 43 min Visit#: 2  of 24   Re-eval: 10/04/11 Charges: Gait x 15' Therex x 25'  Subjective: Symptoms/Limitations Symptoms: Pt states that she has pain in B legs which increases with standin/walking. Pain Assessment Currently in Pain?: Yes Pain Score:   6 Pain Location: Leg Pain Orientation: Right;Left   Exercise/Treatments Standing Heel Raises: 10 reps Forward Step Up: 10 reps Gait Training: To improve balnce/control and activity tolerace. 1x1'40"; 1x3' Other Standing Knee Exercises: Side stepping at mat Other Standing Knee Exercises: marching in place x 10 Seated Other Seated Knee Exercises: sit to stand x 5 Supine Other Supine Knee Exercises: Standing w/o UE assist x 2  Physical Therapy Assessment and Plan PT Assessment and Plan Clinical Impression Statement: Pt displays improved gait pattern and balance this session. Pt ambulates into therapy with rolling walker. Pt tolerates exercises well but is easily fatigued. Pt reports HEP compliance. Pt requires VC's to avoid locking knees. Pt inquires about driving. Pt advised to consult MD. Pt educated on the importance of LE reaction time while driving. PT Plan: Continue to progress standing/gait/balance activities.     Problem List Patient Active Problem List  Diagnoses  . Weakness of both legs  . Difficulty in walking    PT - End of Session Activity Tolerance: Patient tolerated treatment well General Behavior During Session: [facility] for tasks performed Cognition: [facility] for tasks performed  Antonieta Iba 09/16/2011, 3:45 PM

## 2011-09-18 ENCOUNTER — Ambulatory Visit (HOSPITAL_COMMUNITY)
Admission: RE | Admit: 2011-09-18 | Discharge: 2011-09-18 | Disposition: A | Discharge status: Home / Self Care | Payer: BC Managed Care – PPO | Source: Ambulatory Visit

## 2011-09-18 DIAGNOSIS — R29898 Other symptoms and signs involving the musculoskeletal system: Secondary | ICD-10-CM

## 2011-09-18 DIAGNOSIS — R262 Difficulty in walking, not elsewhere classified: Secondary | ICD-10-CM

## 2011-09-18 NOTE — Progress Notes (Signed)
Physical Therapy Treatment Patient Details  Name: Nancy [name] MRN: [id number] Date of Birth: [birth year]  Today's Date: 09/18/2011 Time: 6578-4696 Time Calculation (min): 44 min Visit#: 3  of 24   Re-eval:  10/04/2011    Subjective: Symptoms/Limitations Symptoms: Pt having more pain in her legs today R greater L  Pain Assessment Pain Score:   8 Pain Location: Leg Pain Orientation: Right;Left;Lower    Exercise/Treatments  Aerobic Stationary Bike: 6' @2 .5 seat at 13    Standing Heel Raises: 15 reps Lateral Step Up: 10 reps Forward Step Up: 10 reps Functional Squat: 10 reps Seated Long Arc Quad: Strengthening;10 reps;Weights Long Arc Quad Weight: 3 lbs. Other Seated Knee Exercises:  (sit to stand 5 x r leg back; 5 x L leg back) Supine Bridges: 15 reps Straight Leg Raises: Strengthening;Right;Left;10 reps;Limitations Straight Leg Raises Limitations: R 3 # Other Supine Knee Exercises:  (stand no UE x 3' dancing) Sidelying Hip ABduction: Strengthening;10 reps Prone  Hip Extension: 10 reps      Physical Therapy Assessment and Plan PT Assessment and Plan Clinical Impression Statement: Pt with better form and control added ex per flowsheet.  Rehab Potential: Good PT Plan: continue with POC     Goals    Problem List Patient Active Problem List  Diagnoses  . Weakness of both legs  . Difficulty in walking    PT - End of Session Activity Tolerance: Patient tolerated treatment well General Behavior During Session: [facility] for tasks performed Cognition: [facility] for tasks performed  GP No functional reporting required  James Lafalce,CINDY 09/18/2011, 4:57 PM

## 2011-09-20 ENCOUNTER — Ambulatory Visit: Payer: Self-pay | Admitting: Family Medicine

## 2011-09-20 ENCOUNTER — Ambulatory Visit: Payer: BC Managed Care – PPO | Admitting: Family Medicine

## 2011-09-20 ENCOUNTER — Ambulatory Visit (HOSPITAL_COMMUNITY): Payer: BC Managed Care – PPO | Admitting: Physical Therapy

## 2011-09-23 ENCOUNTER — Ambulatory Visit (HOSPITAL_COMMUNITY): Payer: BC Managed Care – PPO | Admitting: Physical Therapy

## 2011-09-23 ENCOUNTER — Telehealth (HOSPITAL_COMMUNITY): Payer: Self-pay

## 2011-09-24 ENCOUNTER — Ambulatory Visit (HOSPITAL_COMMUNITY)
Admission: RE | Admit: 2011-09-24 | Discharge: 2011-09-24 | Disposition: A | Discharge status: Home / Self Care | Payer: BC Managed Care – PPO | Source: Ambulatory Visit

## 2011-09-24 NOTE — Progress Notes (Signed)
Physical Therapy Treatment Patient Details  Name: Nancy [name] MRN: [id number] Date of Birth: 15-Nov-1985  Today's Date: 09/24/2011 Time: 2440-1027 Time Calculation (min): 52 min Visit#: 4  of 24   Re-eval: 10/04/11 Charges: Therex x 40' Gait x 5'  Subjective: Symptoms/Limitations Symptoms: Pt states that she is hurting in her thighs and her hips. She walked from the ED to the therapy department today. Pt states that she walked som without the walker this weekend. Pain Assessment Currently in Pain?: Yes Pain Score:   8 Pain Location: Leg Pain Orientation: Right;Left   Exercise/Treatments Standing Heel Raises: 15 reps;Limitations Heel Raises Limitations: 15 toe raises Lateral Step Up: 10 reps;Step Height: 4";Both Forward Step Up: 10 reps;Both;Step Height: 4" Functional Squat: 15 reps Gait Training: w/SPC to with cues to improve sequence Other Standing Knee Exercises: toe touch x 10 6" step Other Standing Knee Exercises: church pews x 10 at blue mat Supine Bridges: 15 reps Straight Leg Raises: Strengthening;Right;Left;10 reps;Limitations Straight Leg Raises Limitations: B 3 # Sidelying Hip ABduction: 10 reps;Limitations Hip ABduction Limitations: 3# B  Physical Therapy Assessment and Plan PT Assessment and Plan Clinical Impression Statement: Pt tolerates exercises well. Pt presents with improved activity tolerance. Began gait training with SPC with cueing for proper form and sequence. After cue pt ambulates well with SPC with min assist. Pt presents with improved gait tolerance. Began church pew exercises to improve proprioceptive control as pt tends to loose balance with posterior lean. PT Plan: Continue to progress per PT POC.      Problem List Patient Active Problem List  Diagnoses  . Weakness of both legs  . Difficulty in walking    PT - End of Session Activity Tolerance: Patient tolerated treatment well General Behavior During Session: [facility] for tasks  performed Cognition: [facility] for tasks performed    Antonieta Iba 09/24/2011, 2:53 PM

## 2011-09-25 ENCOUNTER — Ambulatory Visit (HOSPITAL_COMMUNITY)
Admission: RE | Admit: 2011-09-25 | Discharge: 2011-09-25 | Disposition: A | Discharge status: Home / Self Care | Payer: BC Managed Care – PPO | Source: Ambulatory Visit

## 2011-09-25 NOTE — Progress Notes (Signed)
Physical Therapy Treatment Patient Details  Name: NIKIA LEVELS MRN: [id number] Date of Birth: 16-Sep-1985  Today's Date: 09/25/2011 Time: 1117-1200 Time Calculation (min): 43 min Visit#: 5  of 24   Re-eval: 10/04/11 Charges: Therex x 20' NMR x 12' Gait x 6'  Subjective: Symptoms/Limitations Symptoms: I was not too sore after last time. Pain Assessment Pain Score:   4 Pain Location: Leg Pain Orientation: Right;Left   Exercise/Treatments Standing Heel Raises: 15 reps;Limitations Heel Raises Limitations: 15 toe raises Lateral Step Up: 10 reps;Step Height: 4";Both Forward Step Up: 10 reps;Both;Step Height: 4" Functional Squat: 15 reps Rocker Board: 1 minute;Limitations Rocker Board Limitations: R/L; A/P Gait Training: w/SPC to with cues to improve sequence Other Standing Knee Exercises: toe touch x 10 6" step Other Standing Knee Exercises: Reaching forward/back for cones 1 RT B  Physical Therapy Assessment and Plan PT Assessment and Plan Clinical Impression Statement: Pt able to complete all activities away from mat at // bars with seat behind her. Pt displays improved stability with SPC. Pt requires encouragement to go outside of her COG. PT Plan: Continue to progress per PT POC.     Problem List Patient Active Problem List  Diagnoses  . Weakness of both legs  . Difficulty in walking    PT - End of Session Activity Tolerance: Patient tolerated treatment well General Behavior During Session: [facility] for tasks performed Cognition: [facility] for tasks performed   Antonieta Iba 09/25/2011, 12:44 PM

## 2011-09-26 ENCOUNTER — Ambulatory Visit (INDEPENDENT_AMBULATORY_CARE_PROVIDER_SITE_OTHER): Payer: BC Managed Care – PPO | Admitting: Family Medicine

## 2011-09-26 ENCOUNTER — Encounter: Payer: Self-pay | Admitting: Family Medicine

## 2011-09-26 ENCOUNTER — Ambulatory Visit (HOSPITAL_COMMUNITY)
Admission: RE | Admit: 2011-09-26 | Discharge: 2011-09-26 | Disposition: A | Discharge status: Home / Self Care | Payer: BC Managed Care – PPO | Source: Ambulatory Visit

## 2011-09-26 ENCOUNTER — Ambulatory Visit: Payer: BC Managed Care – PPO | Admitting: Family Medicine

## 2011-09-26 VITALS — BP 140/78 | HR 100 | Resp 18 | Ht 67.5 in | Wt 397.1 lb

## 2011-09-26 DIAGNOSIS — F319 Bipolar disorder, unspecified: Secondary | ICD-10-CM

## 2011-09-26 DIAGNOSIS — R29898 Other symptoms and signs involving the musculoskeletal system: Secondary | ICD-10-CM

## 2011-09-26 DIAGNOSIS — R262 Difficulty in walking, not elsewhere classified: Secondary | ICD-10-CM

## 2011-09-26 DIAGNOSIS — F3130 Bipolar disorder, current episode depressed, mild or moderate severity, unspecified: Secondary | ICD-10-CM | POA: Insufficient documentation

## 2011-09-26 MED ORDER — HYDROCODONE-ACETAMINOPHEN 5-500 MG PO TABS: 1.0000 | ORAL_TABLET | Freq: Two times a day (BID) | ORAL | Status: DC | PRN

## 2011-09-26 NOTE — Progress Notes (Signed)
Physical Therapy Treatment Patient Details  Name: Nancy [name] MRN: [id number] Date of Birth: 1985/10/28  Today's Date: 09/26/2011 Time: 1308-6578 Time Calculation (min): 45 min Visit#: 6  of 12   Re-eval: 10/04/11 Charges: NMR x 15' Therex x 20' Gait x 7'  Subjective: Symptoms/Limitations Symptoms: I'm hurting but it's because my legs are so swollen. Pain Assessment Currently in Pain?: Yes Pain Score:   7 Pain Location: Leg Pain Orientation: Right;Left   Exercise/Treatments Standing Heel Raises: 15 reps;Limitations Heel Raises Limitations: 15 toe raises Lateral Step Up: Step Height: 4";Both;15 reps Forward Step Up: Both;Step Height: 4";15 reps Functional Squat: 15 reps Rocker Board: 2 minutes Rocker Board Limitations: R/L; A/P Gait Training: w/SPC to with cues to improve sequence x7' Other Standing Knee Exercises: church pews x 10 Other Standing Knee Exercises: Reaching forward/back for cones 1 RT B  Physical Therapy Assessment and Plan PT Assessment and Plan Clinical Impression Statement: Pt conintues to present with improved activity tolerance. Pt need occasional assistance to maintain balance after standing for a long period of time. Pt reports no change in pain at end of session. PT Plan: Continue to progress per PT POC.     Problem List Patient Active Problem List  Diagnoses  . Weakness of both legs  . Difficulty in walking  . Bipolar 1 disorder  . Morbid obesity    PT - End of Session Activity Tolerance: Patient tolerated treatment well General Behavior During Session: [facility] for tasks performed Cognition: [facility] for tasks performed   Seth Bake, PTA 09/26/2011, 1:56 PM

## 2011-09-26 NOTE — Patient Instructions (Signed)
I will call in 1 week to okay the trip I will review your records  Increase your seroquel to 100mg  at bedtime Take the vicodin as needed F/U 2 weeks (Monday)

## 2011-09-26 NOTE — Assessment & Plan Note (Signed)
Continue PT

## 2011-09-26 NOTE — Assessment & Plan Note (Addendum)
It is unclear what her actual diagnosis is and she does not know. She is in physical therapy and has been gaining strength and movement as she was initially paralyzed. We will obtain records from her previous hospitalization. Upon record review she will be referred to neurology for continued workup  Note I advised patient she should not be driving as her move in her lower extremities is very slow she does not have a reaction time. She was upset with this but agreed to comply

## 2011-09-26 NOTE — Assessment & Plan Note (Signed)
Dietitian referral as exercise besides upper extremeties cannot be done at this time

## 2011-09-26 NOTE — Progress Notes (Signed)
  Subjective:    Patient ID: Nancy [name], female    DOB: November 07, 1985, 26 y.o.   MRN: [id number]  HPI Patient presents to establish care. Her previous provider was in [place]. Medications and history reviewed  Lower extremity weakness and difficulty walking-January 2013 patient awoke with inability to move her lower extremities. She was in [place] at that time and was driven  back home to [facility] where she was admitted for workup. She has CT scan and MRIs done however they have not found the cause of her paralysis. She was hospitalized for one week. She was seen by neurology during the hospitalization however did not followup as an outpatient. She does state that she did not have a spinal tap done which was recommended. Should not have any illness at that time or preceding her weakness. She transferred to [facility] to start physical therapy and to be close to her family. She's been in physical therapy after a stent in a rehabilitation facility in [place]. She is now able to walk very slowly with a rolling walker she has difficulty getting up from a seated position. She does drag her left leg more than her right. She needs to establish with a neurologist as well as other physicians here in [place] for the time being. She has pain in both of her legs which started at the same time as the paralysis. She was only given Tylenol per report while inpatient secondary to their workup. She has extensive  pain that runs from bilateral hips down her legs.  Bipolar-patient was diagnosed with bipolar disease at age 66. She's been in multiple inpatient psychiatric hospital for her bipolar. She most recently was on respiratory twice a day Seroquel and Valium. She was restarted on her medications during her hospitalization for the above. She states her Seroquel dose is too low and she was on 100 mg prior to going into the hospital. She has not established with a psychiatrist here in  town.  No history of high blood pressure, diabetes mellitus, lung disease  She does have a history of resting tachycardia  She is very concerned with her weight as she is at very immobile at this time. She has gained 50 pounds in the past few months.  Pap smear done 3 weeks ago. She has no children. She resides in [place] and is very eager to get back to her job Review of Systems    GEN- + fatigue, fever, weight loss,weakness, recent illness HEENT- denies eye drainage, change in vision, nasal discharge, CVS- denies chest pain, palpitations, +leg edema RESP- denies SOB, cough, wheeze ABD- denies N/V, change in stools, abd pain GU- denies dysuria, hematuria, dribbling, incontinence MSK- + joint pain, muscle aches, injury Neuro- + headache, denies dizziness, syncope, seizure activity      Objective:   Physical Exam GEN- NAD, alert and oriented x3, obese HEENT- PERRL, EOMI, non injected sclera, pink conjunctiva, MMM, oropharynx clear Neck- Supple, no thyromegaly, no carotid bruit CVS- tachycardic, no murmur RESP-CTAB EXT- 1+edema pretibial Neuro- very slow gait, drags left leg, strength decreased bilaterally, DTR 1+ bilat lower ext, strength 5/5 upper ext  Pulses- Radial, DP- 2+ Psych-Depressed appearing, not overly anxious, no hallucinations  Rolling walker w/seat    Assessment & Plan:

## 2011-09-26 NOTE — Assessment & Plan Note (Signed)
I will increase his Seroquel back to her regular dose of 100 mg at night. She will continue her other medications. She is known to stop her medications whenever she feels which is been a part of her disease process. Will refer to psychiatry. At this point I have no records available.

## 2011-09-27 ENCOUNTER — Telehealth: Payer: Self-pay | Admitting: Family Medicine

## 2011-09-27 ENCOUNTER — Ambulatory Visit (HOSPITAL_COMMUNITY): Payer: BC Managed Care – PPO | Admitting: Physical Therapy

## 2011-09-27 MED ORDER — FUROSEMIDE 20 MG PO TABS: 20.0000 mg | ORAL_TABLET | Freq: Every day | ORAL | Status: DC

## 2011-09-27 NOTE — Telephone Encounter (Signed)
Pt aware.

## 2011-09-27 NOTE — Telephone Encounter (Signed)
She can take lasix tablet daily for 1 week, for edema.

## 2011-09-30 ENCOUNTER — Ambulatory Visit (HOSPITAL_COMMUNITY): Payer: BC Managed Care – PPO | Admitting: *Deleted

## 2011-09-30 ENCOUNTER — Encounter: Payer: Self-pay | Admitting: Family Medicine

## 2011-10-01 ENCOUNTER — Ambulatory Visit (HOSPITAL_COMMUNITY): Payer: BC Managed Care – PPO | Admitting: *Deleted

## 2011-10-01 ENCOUNTER — Telehealth: Payer: Self-pay

## 2011-10-01 ENCOUNTER — Telehealth (HOSPITAL_COMMUNITY): Payer: Self-pay

## 2011-10-01 MED ORDER — FUROSEMIDE 20 MG PO TABS: 40.0000 mg | ORAL_TABLET | Freq: Every day | ORAL | Status: DC

## 2011-10-01 NOTE — Telephone Encounter (Signed)
Increase dose to 40mg , pt has f/u on Monday, she states she has been taking her sisters medication

## 2011-10-02 ENCOUNTER — Inpatient Hospital Stay (HOSPITAL_COMMUNITY)
Admission: RE | Admit: 2011-10-02 | Discharge: 2011-10-02 | Payer: BC Managed Care – PPO | Source: Ambulatory Visit | Attending: Physical Therapy | Admitting: Physical Therapy

## 2011-10-03 ENCOUNTER — Ambulatory Visit (HOSPITAL_COMMUNITY)
Admission: RE | Admit: 2011-10-03 | Discharge: 2011-10-03 | Disposition: A | Discharge status: Home / Self Care | Payer: BC Managed Care – PPO | Source: Ambulatory Visit | Attending: Family Medicine | Admitting: Family Medicine

## 2011-10-03 ENCOUNTER — Encounter: Payer: Self-pay | Admitting: Family Medicine

## 2011-10-03 ENCOUNTER — Ambulatory Visit (INDEPENDENT_AMBULATORY_CARE_PROVIDER_SITE_OTHER): Payer: BC Managed Care – PPO | Admitting: Family Medicine

## 2011-10-03 VITALS — BP 120/88 | HR 117 | Resp 16 | Ht 67.5 in | Wt 394.0 lb

## 2011-10-03 DIAGNOSIS — M79609 Pain in unspecified limb: Secondary | ICD-10-CM | POA: Insufficient documentation

## 2011-10-03 DIAGNOSIS — R29898 Other symptoms and signs involving the musculoskeletal system: Secondary | ICD-10-CM

## 2011-10-03 DIAGNOSIS — IMO0001 Reserved for inherently not codable concepts without codable children: Secondary | ICD-10-CM | POA: Insufficient documentation

## 2011-10-03 DIAGNOSIS — R600 Localized edema: Secondary | ICD-10-CM

## 2011-10-03 DIAGNOSIS — M6281 Muscle weakness (generalized): Secondary | ICD-10-CM | POA: Insufficient documentation

## 2011-10-03 DIAGNOSIS — Z79899 Other long term (current) drug therapy: Secondary | ICD-10-CM

## 2011-10-03 DIAGNOSIS — R262 Difficulty in walking, not elsewhere classified: Secondary | ICD-10-CM | POA: Insufficient documentation

## 2011-10-03 DIAGNOSIS — R609 Edema, unspecified: Secondary | ICD-10-CM

## 2011-10-03 NOTE — Assessment & Plan Note (Signed)
Patient will continue Lasix 40 mg a day. I'm unclear why states she only urinates once a day at baseline. I will send him for compression hose as well she has a lot of dependent edema as she is limited with ambulation. Labs to be obtained, most recent CR normal

## 2011-10-03 NOTE — Assessment & Plan Note (Signed)
Will refer to neurology for second opinion regarding conversion disorder as cause

## 2011-10-03 NOTE — Progress Notes (Signed)
  Subjective:    Patient ID: Nancy [name], female    DOB: 1986-01-27, 26 y.o.   MRN: [id number]  HPI Leg swelling- patient states her leg swelling was much worse the past couple of days and the Lasix was not helping. She did increase to 40 mg the state she only urinated one time and that day which is normal for her. She does believe her legs look smaller today. Her legs have been very painful that she is taking increased doses of hydrocodone however states he still does not help. She also had some discomfort in her chest last night denies current chest pain, shortness of breath  She would like to have gastric bypass done.  Weakness- I reviewed her hospital records and discuss this with patient. Her working diagnosis conversion disorder however she denies any stressful or traumatic event prior to her hospitalization. She would like to have second opinion. She did cancel PT twice this week secondary to pain in her legs with the swelling.  Review of Systems - per above     Objective:   Physical Exam  GEN- NAD, alert and oriented x3, obese CVS- tachycardic, no murmur , no JVD RESP-CTAB EXT- 1+edema pretibial, 2+ edema over feet Pulses- Radial, DP- 2+  Rolling walker w/seat      Assessment & Plan:   I discussed with her once her medical problems are stable and she could consider gastric bypass intervention.

## 2011-10-03 NOTE — Patient Instructions (Signed)
Take 40mg  of lasix daily for the leg swelling Use the compression hose Get the labs done before your next visit  I will refer you to neurology in [place] ( Dr. Fabiola Backer)  F/U in 3 weeks

## 2011-10-03 NOTE — Progress Notes (Signed)
Physical Therapy Treatment Patient Details  Name: Nancy [name] MRN: [id number] Date of Birth: [birth year]  Today's Date: 10/03/2011 Time: 9147-8295 Time Calculation (min): 50 min Visit#: 7  of 24   Re-eval: 10/31/11 Charges: PPT x 15' MMT x 1 Therex x 15'  Subjective: Symptoms/Limitations Symptoms: I'm hurting and my legs are very swollen. Pain Assessment Currently in Pain?: Yes Pain Score:   8 Pain Location: Leg Pain Orientation: Right;Left  Objective:  10/03/11 1331  RLE Strength  Right Hip Flexion 3+/5  Right Hip Extension 4/5  Right Hip ABduction 3+/5  Right Hip ADduction 3+/5  Right Knee Flexion 5/5  Right Knee Extension 4/5  Right Ankle Dorsiflexion 5/5  LLE Strength  Left Hip Flexion 3+/5  Left Hip Extension 4/5  Left Hip ABduction 4/5  Left Hip ADduction 3+/5  Left Knee Flexion 5/5  Left Knee Extension 4/5  Left Ankle Dorsiflexion 5/5     Exercise/Treatments Berg Balance Test Sit to Stand: Able to stand without using hands and stabilize independently Standing Unsupported: Able to stand 2 minutes with supervision Sitting with Back Unsupported but Feet Supported on Floor or Stool: Able to sit safely and securely 2 minutes Stand to Sit: Sits safely with minimal use of hands Transfers: Able to transfer safely, definite need of hands Standing Unsupported with Eyes Closed: Able to stand 10 seconds with supervision Standing Ubsupported with Feet Together: Able to place feet together independently but unable to hold for 30 seconds From Standing, Reach Forward with Outstretched Arm: Can reach forward >12 cm safely (5") From Standing Position, Pick up Object from Floor: Unable to pick up and needs supervision From Standing Position, Turn to Look Behind Over each Shoulder: Needs supervision when turning Turn 360 Degrees: Needs close supervision or verbal cueing Standing Unsupported, Alternately Place Feet on Step/Stool: Able to complete >2 steps/needs minimal  assist Standing Unsupported, One Foot in Front: Loses balance while stepping or standing Standing on One Leg: Able to lift leg independently and hold 5-10 seconds Total Score: 33   Standing Heel Raises: 20 reps Heel Raises Limitations: 20 toe raises Functional Squat: 20 reps Rocker Board: 2 minutes Rocker Board Limitations: R/L; A/P  Physical Therapy Assessment and Plan PT Assessment and Plan Clinical Impression Statement: Pt displays significant gains in LE strength. Pt able to complete BERG balance assessment this session. Pt's main goal is to ambulate without and AD. Pt presents with increased swelling in B LE. Pt states her doctor is aware of this and has recommended compression garments. PT Plan: Recommend to PT to continue x 4 more weeks.    Goals Home Exercise Program Pt will Perform Home Exercise Program: Independently PT Short Term Goals Time to Complete Short Term Goals: 4 weeks PT Short Term Goal 1: Pt able to stand UE unsupported x 2 min PT Short Term Goal 1 - Progress: Met PT Short Term Goal 2: Pt mm strength to improve one grade so that pt is not falling. PT Short Term Goal 2 - Progress: Met PT Short Term Goal 3: Pt to state she has not fallen in the past two weeks PT Short Term Goal 3 - Progress: Not met (Pt states she fell out of chair while reaching down 10/02/11) PT Short Term Goal 4: Pt to be ambulating with the rollator in her sister's home on a daily basis without falling PT Short Term Goal 4 - Progress: Not met PT Short Term Goal 5: Pt balance to be good enough to complet the  Berg balance test PT Short Term Goal 5 - Progress: Met PT Long Term Goals Time to Complete Long Term Goals: 8 weeks PT Long Term Goal 1: I in advance HEP PT Long Term Goal 1 - Progress: Progressing toward goal PT Long Term Goal 2: Pt strength to be 4+/5 to allow pt to ambulate with a cane inside her house PT Long Term Goal 2 - Progress: Partly met Long Term Goal 3: Pt Berg balance to be  increased to level where patient is safe walking with a cane. Long Term Goal 3 Progress: Progressing toward goal Long Term Goal 4: Pt pain to be no greater than a 3 to improve her quality of life. Long Term Goal 4 Progress: Not met PT Long Term Goal 5: Pt to be at a point where she is able to move out of her sisters house and be safe. Long Term Goal 5 Progress: Progressing toward goal  Problem List Patient Active Problem List  Diagnoses  . Weakness of both legs  . Difficulty in walking  . Bipolar 1 disorder  . Morbid obesity  . Leg edema    PT - End of Session Activity Tolerance: Patient tolerated treatment well General Behavior During Session: [facility] for tasks performed Cognition: [facility] for tasks performed    Seth Bake, PTA 10/03/2011, 2:07 PM

## 2011-10-07 ENCOUNTER — Telehealth: Payer: Self-pay | Admitting: Family Medicine

## 2011-10-07 ENCOUNTER — Ambulatory Visit: Payer: BC Managed Care – PPO | Admitting: Family Medicine

## 2011-10-07 MED ORDER — DIAZEPAM 10 MG PO TABS: 10.0000 mg | ORAL_TABLET | Freq: Four times a day (QID) | ORAL | Status: DC | PRN

## 2011-10-07 NOTE — Telephone Encounter (Signed)
Given short-term supply. She has not been establish with a psychiatrist here.

## 2011-10-08 ENCOUNTER — Emergency Department (HOSPITAL_COMMUNITY): Payer: BC Managed Care – PPO

## 2011-10-08 ENCOUNTER — Emergency Department (HOSPITAL_COMMUNITY)
Admission: EM | Admit: 2011-10-08 | Discharge: 2011-10-08 | Disposition: A | Discharge status: Home Health | Payer: BC Managed Care – PPO | Attending: Emergency Medicine | Admitting: Emergency Medicine

## 2011-10-08 ENCOUNTER — Other Ambulatory Visit: Payer: Self-pay

## 2011-10-08 ENCOUNTER — Encounter (HOSPITAL_COMMUNITY): Payer: Self-pay | Admitting: *Deleted

## 2011-10-08 DIAGNOSIS — R600 Localized edema: Secondary | ICD-10-CM

## 2011-10-08 DIAGNOSIS — G4733 Obstructive sleep apnea (adult) (pediatric): Secondary | ICD-10-CM | POA: Insufficient documentation

## 2011-10-08 DIAGNOSIS — M79609 Pain in unspecified limb: Secondary | ICD-10-CM

## 2011-10-08 DIAGNOSIS — M7989 Other specified soft tissue disorders: Secondary | ICD-10-CM

## 2011-10-08 DIAGNOSIS — F319 Bipolar disorder, unspecified: Secondary | ICD-10-CM | POA: Insufficient documentation

## 2011-10-08 DIAGNOSIS — I498 Other specified cardiac arrhythmias: Secondary | ICD-10-CM | POA: Insufficient documentation

## 2011-10-08 DIAGNOSIS — R079 Chest pain, unspecified: Secondary | ICD-10-CM | POA: Insufficient documentation

## 2011-10-08 DIAGNOSIS — F411 Generalized anxiety disorder: Secondary | ICD-10-CM | POA: Insufficient documentation

## 2011-10-08 DIAGNOSIS — R609 Edema, unspecified: Secondary | ICD-10-CM | POA: Insufficient documentation

## 2011-10-08 LAB — BASIC METABOLIC PANEL
BUN: 11 mg/dL (ref 6–23)
CO2: 27 mEq/L (ref 19–32)
Calcium: 9.7 mg/dL (ref 8.4–10.5)
Creatinine, Ser: 0.57 mg/dL (ref 0.50–1.10)
Glucose, Bld: 146 mg/dL — ABNORMAL HIGH (ref 70–99)
Sodium: 136 mEq/L (ref 135–145)

## 2011-10-08 IMAGING — CR DG CHEST 1V PORT
1 series · 1 of 1 positions shown · non-contrast
Comparison: [DATE]

CLINICAL DATA: Right-sided chest pain

PORTABLE CHEST - 1 VIEW

[view not recorded]
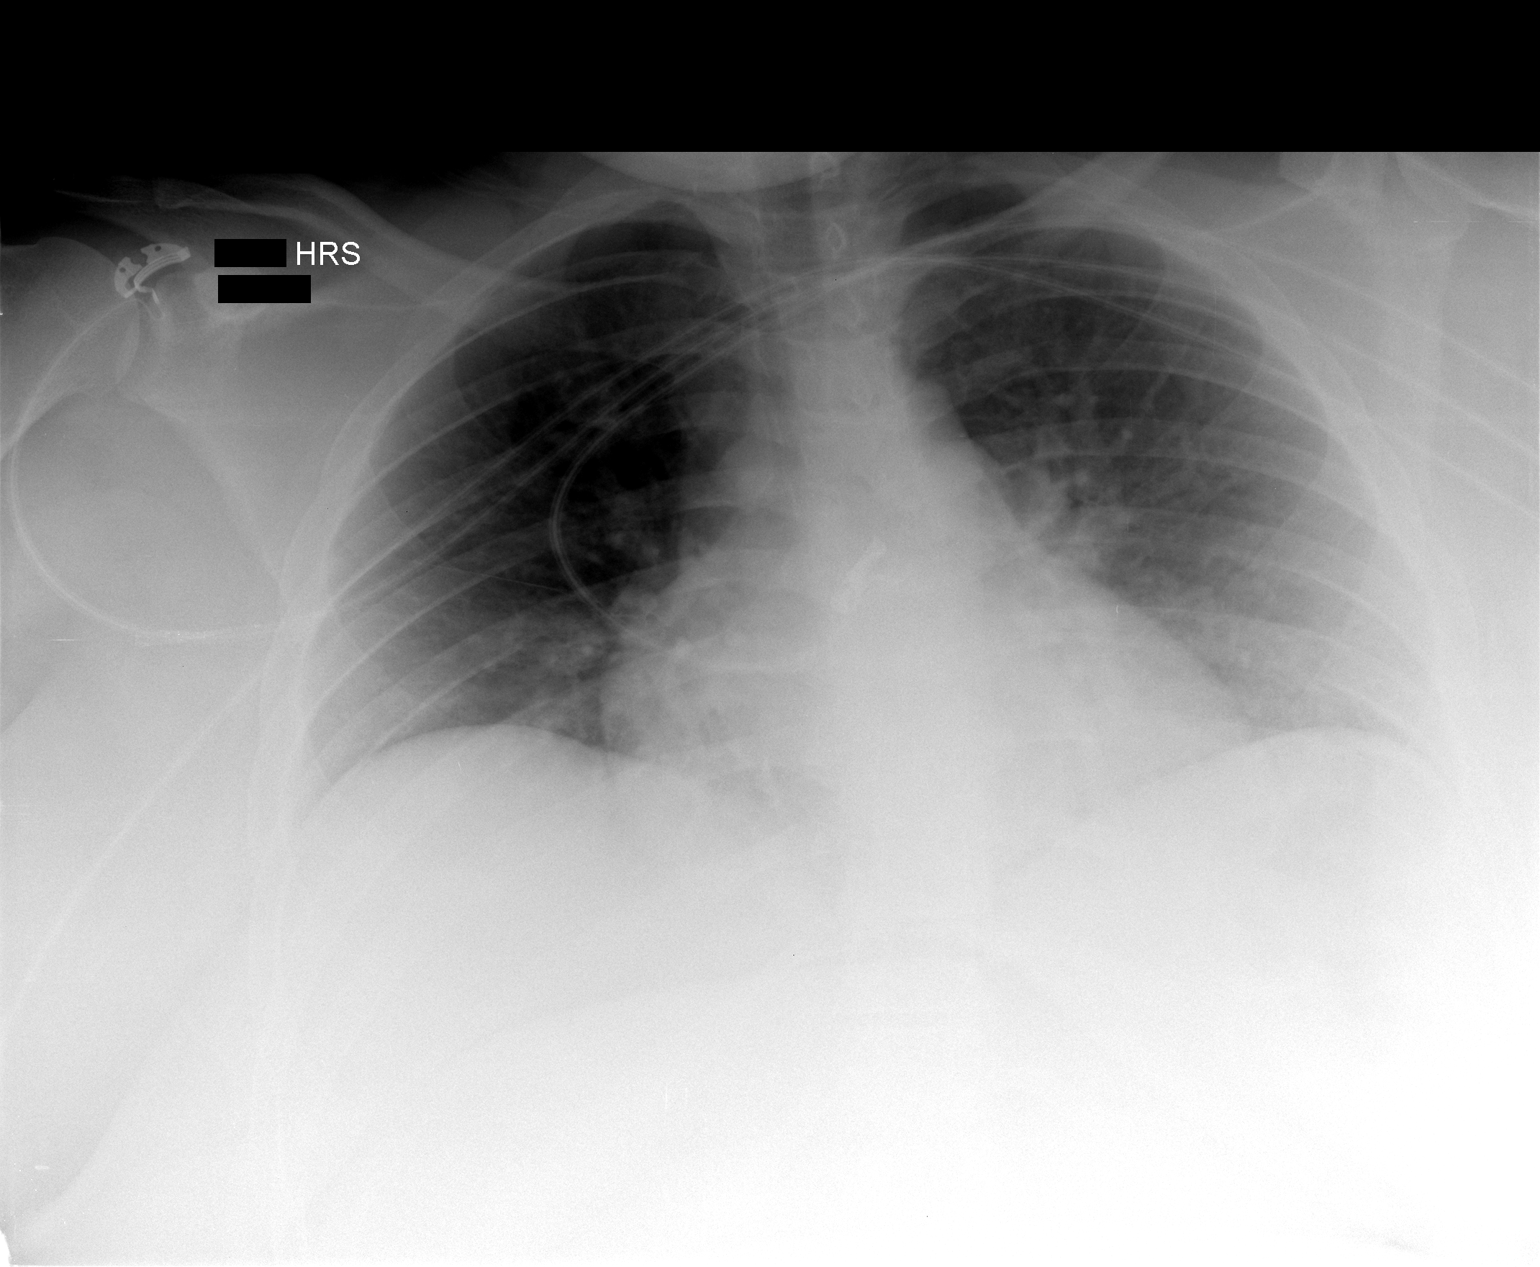

[1 of 1 positions shown; findings below may reference images not displayed]

FINDINGS: Portable technique is limited by hypoaeration and patient
body habitus.  There is interstitial and vascular crowding.  No
definite focal consolidation.  Cardiomediastinal contours are
grossly within normal limits.  No acute osseous abnormality is
identified.
IMPRESSION: No definite acute process.

## 2011-10-08 IMAGING — CT CT ANGIO CHEST
1 of 10 series · 2 of 37 positions shown · IV contrast (Omnipaque 300)
Comparison: [DATE]

CLINICAL DATA: Rule out pulmonary embolus.  Elevated D-dimer and
chest pain.

CT ANGIOGRAPHY CHEST
TECHNIQUE: Multidetector CT imaging of the chest using the
standard protocol during bolus administration of intravenous
contrast. Multiplanar reconstructed images including MIPs were
obtained and reviewed to evaluate the vascular anatomy.
Contrast: 120mL OMNIPAQUE IOHEXOL 350 MG/ML IV SOLN

[Series 11: pe 3.0 b40f · axial · 0.68mm/px · z∈[-138,-114]mm · 2 of 25 slices shown]
[im 9/25  lung]
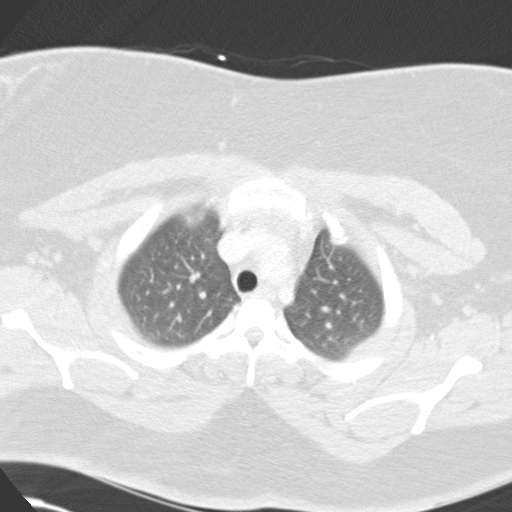
[im 17/25  mediastinal]
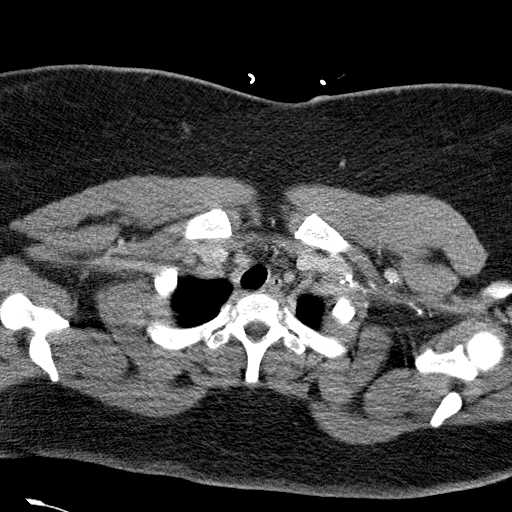

[2 of 37 positions shown; findings below may reference images not displayed]

FINDINGS: Examination is suboptimal. Because of the patient's body habitus
and immobility of the CT gantry to move only the lung apices were
imaged.  This is not to diagnostic for evaluating for pulmonary
embolus.

The upper lobe pulmonary arteries are suboptimally opacified.

The lung apices are clear of infiltrate.

No acute bony abnormality noted within the imaged portion of the
upper thorax.
IMPRESSION: 1.  Examination is not diagnostic for the detection of pulmonary
embolus.  Only the lung apices and associated upper portion of the
bony thorax imaged.

## 2011-10-08 IMAGING — NM NM PULMONARY VENT & PERF
1 series · 6 of 6 positions shown · non-contrast
Comparison: [DATE]

CLINICAL DATA: Pleuritic chest pain

NM PULMONARY VENTILATION AND PERFUSION SCAN
Radiopharmaceutical: [2U] CURTIS xenon CURTIS 133 gas 20 CURTIS
XENON CURTIS 133 GAS, [2U] CURTIS TECHNETIUM TO 99M ALBUMIN
AGGREGATED

[vq lung vent/perf · 2.71mm/px · 6 of 17 frames shown]
[frame 2/17  full-range]
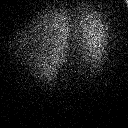
[frame 4/17  full-range]
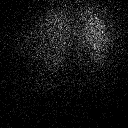
[frame 7/17  full-range]
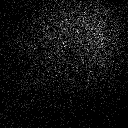
[frame 10/17  full-range]
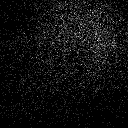
[frame 13/17  full-range]
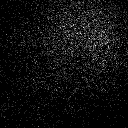
[frame 16/17  full-range]
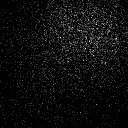

[6 of 6 positions shown; findings below may reference images not displayed]

FINDINGS: Exam detail is diminished secondary to patient's body
habitus.

On the ventilation portion of the examination there is relative
normal distribution of xenon tracer to both lungs.

On the washout phase of the examination there are no abnormal areas
of xenon tracer retention.

On the perfusion portion of the examination the lung volumes are
noted to be low.  There is a relatively uniform distribution of the
radiopharmaceutical to both lungs. No medium are large size
segmental perfusion defects noted.
IMPRESSION: 1.  Significantly diminished exam detail due to the patient's body
habitus.
2.  Low probability for acute pulmonary embolus.

## 2011-10-08 MED ORDER — CYCLOBENZAPRINE HCL 10 MG PO TABS
10.0000 mg | ORAL_TABLET | Freq: Once | ORAL | Status: AC
  Administered 2011-10-08: 10 mg via ORAL
  Filled 2011-10-08: qty 1

## 2011-10-08 MED ORDER — TECHNETIUM TO 99M ALBUMIN AGGREGATED
3.0000 | Freq: Once | INTRAVENOUS | Status: AC | PRN
  Administered 2011-10-08: 3 via INTRAVENOUS

## 2011-10-08 MED ORDER — XENON XE 133 GAS
20.0000 | GAS_FOR_INHALATION | Freq: Once | RESPIRATORY_TRACT | Status: AC | PRN
  Administered 2011-10-08: 20 via RESPIRATORY_TRACT

## 2011-10-08 MED ORDER — MORPHINE SULFATE 4 MG/ML IJ SOLN
4.0000 mg | Freq: Once | INTRAMUSCULAR | Status: AC
  Administered 2011-10-08: 4 mg via INTRAVENOUS
  Filled 2011-10-08: qty 1

## 2011-10-08 MED ORDER — MORPHINE SULFATE 4 MG/ML IJ SOLN
6.0000 mg | Freq: Once | INTRAMUSCULAR | Status: AC
  Administered 2011-10-08: 4 mg via INTRAVENOUS
  Filled 2011-10-08: qty 1

## 2011-10-08 MED ORDER — MORPHINE SULFATE 2 MG/ML IJ SOLN
INTRAMUSCULAR | Status: AC
  Administered 2011-10-08: 2 mg via INTRAVENOUS
  Filled 2011-10-08: qty 1

## 2011-10-08 MED ORDER — IOHEXOL 350 MG/ML SOLN
120.0000 mL | Freq: Once | INTRAVENOUS | Status: AC | PRN
  Administered 2011-10-08: 120 mL via INTRAVENOUS

## 2011-10-08 NOTE — Discharge Instructions (Signed)
Pleurisy  Pleurisy is an inflammation and swelling of the lining of the lungs. It usually is the result of an underlying infection or other disease. Because of this inflammation, it hurts to breathe. It is aggravated by coughing or deep breathing. The primary goal in treating pleurisy is to diagnose and treat the condition that caused it.   HOME CARE INSTRUCTIONS    Only take over-the-counter or prescription medicines for pain, discomfort, or fever as directed by your caregiver.   If medications which kill germs (antibiotics) were prescribed, take the entire course. Even if you are feeling better, you need to take them.   Use a cool mist vaporizer to help loosen secretions. This is so the secretions can be coughed up more easily.  SEEK MEDICAL CARE IF:    Your pain is not controlled with medication or is increasing.   You have an increase inpus like (purulent) secretions brought up with coughing.  SEEK IMMEDIATE MEDICAL CARE IF:    You have blue or dark lips, fingernails, or toenails.   You begin coughing up blood.   You have increased difficulty breathing.   You have continuing pain unrelieved by medicine or lasting more than 1 week.   You have pain that radiates into your neck, arms, or jaw.   You develop increased shortness of breath or wheezing.   You develop a fever, rash, vomiting, fainting, or other serious complaints.  Document Released: 07/15/2005 Document Revised: 07/04/2011 Document Reviewed: 02/13/2007  [employer] Patient Information 2012 [employer].

## 2011-10-08 NOTE — ED Notes (Addendum)
Pt to department via EMS.  Per report pain began about 0100. Right side pain. Radiating to right arm. Pt reports increased pain with deep breathing.  Denies nausea, vomiting or SOB.  Per EMS report pt did take 20 mg of Valium about 0200 believing pain was stress related. Pt report pain worse when laying down.

## 2011-10-08 NOTE — ED Notes (Signed)
Pt does not want to have bilat venous study

## 2011-10-08 NOTE — ED Notes (Signed)
Pt remains of the floor at this time.

## 2011-10-08 NOTE — ED Notes (Signed)
Ct was unable to do angio of the chest due to weight restrictions, EDP notified.

## 2011-10-08 NOTE — ED Provider Notes (Signed)
History     CSN: [id number]  Arrival date & time 10/08/11  0505   First MD Initiated Contact with Patient 10/08/11 [phone]      Chief Complaint  Patient presents with  . Pleurisy    (Consider location/radiation/quality/duration/timing/severity/associated sxs/prior treatment) HPI  Patient relates she has not slept in 2 days because her father was severely ill and he actually died yesterday. She states she had some mild chest pain all day yesterday but about 1 AM he got acutely worse. She states the pain is pleuritic and she feels short of breath. She states it's in the right side of her chest and goes down her right arm. She states she's never had it before. Patient states she took Valium without improvement because she thought it was from the stress of her father's death. She has chronic swelling and pain of her legs and states she's actually out of work right now getting physical therapy for that. She was seen any pain ER and actually had a attempt to get a CT angiogram her chest to rule out pulmonary embolism however after the dye was injected the table would not move so only her apices were seen. She was sent here for further studies.  PCP Dr. Jeanice Lim in [place]  Past Medical History  Diagnosis Date  . Bipolar disorder   . Tachycardia   . CIN I (cervical intraepithelial neoplasia I)   . OSA (obstructive sleep apnea)   . Anxiety   . Nephrolithiasis    morbid obesity  Past Surgical History  Procedure Date  . Bladder augmentation 2001    enlargement    Family History  Problem Relation Age of Onset  . Hypertension Mother   . Depression Mother   . Diabetes Mother   . Hypertension Father   . Diabetes Father   . Depression Father   . Hypertension Sister   . Diabetes Sister   . Depression Sister   . Depression Brother   . Diabetes Brother   . Hypertension Brother   . Cancer Maternal Aunt   . Cancer Paternal Aunt   . Hypertension Maternal Grandmother   . Diabetes  Maternal Grandmother   . Depression Maternal Grandmother   . Hypertension Maternal Grandfather   . Diabetes Maternal Grandfather   . Depression Maternal Grandfather   . Hypertension Paternal Grandmother   . Diabetes Paternal Grandmother   . Depression Paternal Grandmother   . Hypertension Paternal Grandfather   . Diabetes Paternal Grandfather   . Depression Paternal Grandfather     History  Substance Use Topics  . Smoking status: Former Games developer  . Smokeless tobacco: Not on file  . Alcohol Use:  occasionally   employed in a call center  OB History    Grav Para Term Preterm Abortions TAB SAB Ect Mult Living                  Review of Systems  All other systems reviewed and are negative.    Allergies  Asa buff (mag and Nsaids  Home Medications   Current Outpatient Rx  Name Route Sig Dispense Refill  . ACETAMINOPHEN 500 MG PO TABS Oral Take 1,500 mg by mouth every 6 (six) hours as needed. For pain    . DIAZEPAM 10 MG PO TABS Oral Take 10 mg by mouth every 6 (six) hours as needed. For anxiety    . FUROSEMIDE 20 MG PO TABS Oral Take 40 mg by mouth daily.    Marland Kitchen HYDROCODONE-ACETAMINOPHEN  5-500 MG PO TABS Oral Take 1 tablet by mouth 2 (two) times daily as needed. For pain    . QUETIAPINE FUMARATE 50 MG PO TABS Oral Take 100 mg by mouth at bedtime.     Marland Kitchen RISPERIDONE 0.5 MG PO TABS Oral Take 0.5 mg by mouth 2 (two) times daily.      BP 130/81  Pulse 117  Temp(Src) 97.6 F (36.4 C) (Oral)  Resp 17  SpO2 100%  Vital signs normal    Physical Exam  Nursing note and vitals reviewed. Constitutional: She is oriented to person, place, and time. She appears well-developed and well-nourished.  Non-toxic appearance. She does not appear ill. No distress.       Morbidly obese  HENT:  Head: Normocephalic and atraumatic.  Right Ear: External ear normal.  Left Ear: External ear normal.  Nose: Nose normal. No mucosal edema or rhinorrhea.  Mouth/Throat: Oropharynx is clear and moist  and mucous membranes are normal. No dental abscesses or uvula swelling.  Eyes: Conjunctivae and EOM are normal. Pupils are equal, round, and reactive to light.  Neck: Normal range of motion and full passive range of motion without pain. Neck supple.  Cardiovascular: Normal rate, regular rhythm and normal heart sounds.  Exam reveals no gallop and no friction rub.   No murmur heard. Pulmonary/Chest: Effort normal and breath sounds normal. No respiratory distress. She has no wheezes. She has no rhonchi. She has no rales. She exhibits no tenderness and no crepitus.  Abdominal: Soft. Normal appearance and bowel sounds are normal. She exhibits no distension. There is no tenderness. There is no rebound and no guarding.  Musculoskeletal: Normal range of motion. She exhibits no edema and no tenderness.       Moves all extremities well. Legs are swollen without pain  Neurological: She is alert and oriented to person, place, and time. She has normal strength. No cranial nerve deficit.  Skin: Skin is warm, dry and intact. No rash noted. No erythema. No pallor.  Psychiatric: Her speech is normal and behavior is normal. Her mood appears not anxious.       Flat affect    ED Course  Procedures (including critical care time)   Medications  iohexol (OMNIPAQUE) 350 MG/ML injection 120 mL (120 mL Intravenous Contrast Given 10/08/11 0744)  morphine 4 MG/ML injection 4 mg (4 mg Intravenous Given 10/08/11 0837)  morphine 4 MG/ML injection 6 mg (4 mg Intravenous Given 10/08/11 0913)  morphine 2 MG/ML injection (2 mg Intravenous Given 10/08/11 0914)  morphine 4 MG/ML injection 4 mg (4 mg Intravenous Given 10/08/11 1214)  cyclobenzaprine (FLEXERIL) tablet 10 mg (10 mg Oral Given 10/08/11 1631)  technetium albumin aggregated (MAA) injection solution 3 milli Curie (3 milli Curie Intravenous Contrast Given 10/08/11 1530)  xenon xe 133 gas 20 milli Curie (20 milli Curie Inhalation Contrast Given 10/08/11 1630)      We  verified patient did receive contrast with her attempted CT in any pan therefore we did not repeat it because of possible renal injury with to contrast load so close together. We're going to attempt to get a VQ scan of her lungs and also Doppler ultrasound of her legs.  PT ultimately refused the doppler US of her legs.   Results for orders placed during the hospital encounter of 10/08/11  BASIC METABOLIC PANEL      Component Value Range   Sodium 136  135 - 145 (mEq/L)   Potassium 3.7  3.5 - 5.1 (  mEq/L)   Chloride 101  96 - 112 (mEq/L)   CO2 27  19 - 32 (mEq/L)   Glucose, Bld 146 (*) 70 - 99 (mg/dL)   BUN 11  6 - 23 (mg/dL)   Creatinine, Ser 4.54  0.50 - 1.10 (mg/dL)   Calcium 9.7  8.4 - 09.8 (mg/dL)   GFR calc non Af Amer >90  >90 (mL/min)   GFR calc Af Amer >90  >90 (mL/min)  D-DIMER, QUANTITATIVE      Component Value Range   D-Dimer, Quant 2.45 (*) 0.00 - 0.48 (ug/mL-FEU)    Ct Angio Chest W/cm &/or Wo Cm  10/08/2011  *RADIOLOGY REPORT*  Clinical Data: Rule out pulmonary embolus.  Elevated D-dimer and chest pain.  CT ANGIOGRAPHY CHEST  Technique:  Multidetector CT imaging of the chest using the standard protocol during bolus administration of intravenous contrast. Multiplanar reconstructed images including MIPs were obtained and reviewed to evaluate the vascular anatomy.  Contrast: OMNIPAQUE IOHEXOL 350 MG/ML IV SOLN  Comparison: 06/12/2010  Findings:  Examination is suboptimal. Because of the patient's body habitus and immobility of the CT gantry to move only the lung apices were imaged.  This is not to diagnostic for evaluating for pulmonary embolus.  The upper lobe pulmonary arteries are suboptimally opacified.  The lung apices are clear of infiltrate.  No acute bony abnormality noted within the imaged portion of the upper thorax.  IMPRESSION:  1.  Examination is not diagnostic for the detection of pulmonary embolus.  Only the lung apices and associated upper portion of the bony  thorax imaged.  Original Report Authenticated By: Rosealee Albee, M.D.   Dg Chest Port 1 View  10/08/2011  *RADIOLOGY REPORT*  Clinical Data: Right-sided chest pain  PORTABLE CHEST - 1 VIEW  Comparison: 07/12/2010  Findings: Portable technique is limited by hypoaeration and patient body habitus.  There is interstitial and vascular crowding.  No definite focal consolidation.  Cardiomediastinal contours are grossly within normal limits.  No acute osseous abnormality is identified.  IMPRESSION: No definite acute process.  Original Report Authenticated By: Waneta Martins, M.D.     1. Chest pain     Disposition per Dr Rosalia Hammers after VQ scan finished.  Devoria Albe, MD, FACEP   MDM          Ward Givens, MD 10/08/11 [phone]

## 2011-10-08 NOTE — ED Provider Notes (Signed)
History     CSN: [id number]  Arrival date & time 10/08/11  0505   First MD Initiated Contact with Patient 10/08/11 [phone]      Chief Complaint  Patient presents with  . Pleurisy    (Consider location/radiation/quality/duration/timing/severity/associated sxs/prior treatment) HPI Comments: 26 year old female with a history of bipolar disorder, tachycardia, sleep apnea and anxiety. She presents after having acute onset of sharp right-sided chest pain with radiation to her right arm which started approximately 1:00 a.m., 4 hours ago. This has been persistent, worse with taking a deep breath and coughing, not associated with nausea vomiting abdominal pain fevers chills or shortness of breath. She has no swelling of her legs, no recent travel, no recent trauma, no recent immobilization or surgery or hormone use. She does admit to having to family members with pulmonary embolisms in the family. Prior to arrival she took 20 mg of Valium suspecting that her symptoms were due to stress. She feels stressed out because her father died approximately 12 hours ago. This was expected as he was hospitalized with severe ailments.  The history is provided by the patient and the EMS personnel.    Past Medical History  Diagnosis Date  . Bipolar disorder   . Tachycardia   . CIN I (cervical intraepithelial neoplasia I)   . OSA (obstructive sleep apnea)   . Anxiety   . Nephrolithiasis     Past Surgical History  Procedure Date  . Bladder augmentation 2001    enlargement    Family History  Problem Relation Age of Onset  . Hypertension Mother   . Depression Mother   . Diabetes Mother   . Hypertension Father   . Diabetes Father   . Depression Father   . Hypertension Sister   . Diabetes Sister   . Depression Sister   . Depression Brother   . Diabetes Brother   . Hypertension Brother   . Cancer Maternal Aunt   . Cancer Paternal Aunt   . Hypertension Maternal Grandmother   . Diabetes Maternal  Grandmother   . Depression Maternal Grandmother   . Hypertension Maternal Grandfather   . Diabetes Maternal Grandfather   . Depression Maternal Grandfather   . Hypertension Paternal Grandmother   . Diabetes Paternal Grandmother   . Depression Paternal Grandmother   . Hypertension Paternal Grandfather   . Diabetes Paternal Grandfather   . Depression Paternal Grandfather     History  Substance Use Topics  . Smoking status: Former Games developer  . Smokeless tobacco: Not on file  . Alcohol Use: No    OB History    Grav Para Term Preterm Abortions TAB SAB Ect Mult Living                  Review of Systems  All other systems reviewed and are negative.    Allergies  Asa buff (mag and Nsaids  Home Medications   Current Outpatient Rx  Name Route Sig Dispense Refill  . DIAZEPAM 10 MG PO TABS Oral Take 1 tablet (10 mg total) by mouth every 6 (six) hours as needed. 45 tablet 0  . FUROSEMIDE 20 MG PO TABS Oral Take 2 tablets (40 mg total) by mouth daily. 40 tablet 0    Note dose change, take 2 tablet daily  . HYDROCODONE-ACETAMINOPHEN 5-500 MG PO TABS Oral Take 1 tablet by mouth 2 (two) times daily as needed for pain. 40 tablet 0  . QUETIAPINE FUMARATE 50 MG PO TABS Oral Take 100 mg  by mouth at bedtime.     Marland Kitchen RISPERIDONE 0.5 MG PO TABS Oral Take 0.5 mg by mouth 2 (two) times daily.      BP 112/73  Pulse 117  Temp(Src) 98.8 F (37.1 C) (Oral)  Resp 16  SpO2 99%  Physical Exam  Nursing note and vitals reviewed. Constitutional: She appears well-developed and well-nourished.       Morbidly obese, drowsy but arousable  HENT:  Head: Normocephalic and atraumatic.  Mouth/Throat: Oropharynx is clear and moist. No oropharyngeal exudate.  Eyes: Conjunctivae and EOM are normal. Pupils are equal, round, and reactive to light. Right eye exhibits no discharge. Left eye exhibits no discharge. No scleral icterus.  Neck: Normal range of motion. Neck supple. No JVD present. No thyromegaly  present.  Cardiovascular: Regular rhythm, normal heart sounds and intact distal pulses.  Exam reveals no gallop and no friction rub.   No murmur heard.      Tachycardia to 115  Pulmonary/Chest: Effort normal and breath sounds normal. No respiratory distress. She has no wheezes. She has no rales.  Abdominal: Soft. Bowel sounds are normal. She exhibits no distension and no mass. There is no tenderness.  Musculoskeletal: Normal range of motion. She exhibits no edema and no tenderness.  Lymphadenopathy:    She has no cervical adenopathy.  Neurological: She is alert. Coordination normal.  Skin: Skin is warm and dry. No rash noted. No erythema.  Psychiatric: She has a normal mood and affect. Her behavior is normal.    ED Course  Procedures (including critical care time)  ED ECG REPORT   Date: 10/08/2011   Rate: 111  Rhythm: sinus tachycardia  QRS Axis: normal  Intervals: normal  ST/T Wave abnormalities: normal  Conduction Disutrbances:none  Narrative Interpretation:   Old EKG Reviewed: none available    Labs Reviewed  BASIC METABOLIC PANEL  D-DIMER, QUANTITATIVE   No results found.   No diagnosis found.    MDM  Patient is morbidly obese but has no overt signs of asymmetry or tenderness or edema of the legs. She has not taken her Seroquel or Risperdal tonight, has had a double dose of her diazepam related to the stress and anxiety of her father passing. Due to the family history of pulmonary embolism, we'll need to rule this out. We'll start with d-dimer, basic metabolic panel, CBC, chest x-ray, EKG.  This time her oxygen levels are 100%, blood pressure is 112/73, respirations are 16, temperature 37.1 Celsius and pulse of 117 the   Care signed out to Dr. Juleen China - pending CT angio of the chest to r/o PE.  Vida Roller, MD 10/08/11 [phone]

## 2011-10-08 NOTE — ED Notes (Signed)
Gave patient blanket as requested.  

## 2011-10-08 NOTE — Progress Notes (Signed)
Bilateral lower extremity venous duplex completed.  Preliminary report is negative for DVT, SVT, or a Baker's cyst. 

## 2011-10-08 NOTE — ED Provider Notes (Signed)
Pt care assumed from Dr Hyacinth Meeker in signout with CTA pending to eval for PE. Unfortunately tables in radiology department at [facility] will only accommodate up to 350 lbs for a VQ scan or CT. Called radiology department at [facility] and confirmed that they could do study on pt of 394 lbs. Will transfer. Discussed with Ghim. Discussed with pt who is aware of need to transport and is in agreement with plan.  Raeford Razor, MD 10/08/11 [phone]

## 2011-10-08 NOTE — ED Notes (Signed)
Pt placed on monitor, EKG completed.  Pt somewhat lethargic, but arouses easily and is oriented x4.

## 2011-10-08 NOTE — Telephone Encounter (Signed)
faxed

## 2011-10-08 NOTE — ED Notes (Signed)
MD Lynelle Doctor notified that pt does not want [place] of legs and that pt is having 9/10 CP.

## 2011-10-08 NOTE — ED Provider Notes (Signed)
Low probability of pe on vq scan.  Discussed with patient and plan discharge.   Hilario Quarry, MD 10/08/11 [phone]

## 2011-10-10 ENCOUNTER — Telehealth (HOSPITAL_COMMUNITY): Payer: Self-pay | Admitting: Dietician

## 2011-10-10 NOTE — Telephone Encounter (Signed)
Received referral from [facility] on 10/04/2011 for dx: morbid obesity.

## 2011-10-10 NOTE — Telephone Encounter (Signed)
Spoke with pt mother, Lucille Passy, during telephone encounter on 10/10/11 at 3:16 PM. She reports pt just lost her father. Requested hold on scheduling appointment. Will follow-up in 1-2 months.

## 2011-10-14 ENCOUNTER — Telehealth: Payer: Self-pay | Admitting: Family Medicine

## 2011-10-14 ENCOUNTER — Telehealth (HOSPITAL_COMMUNITY): Payer: Self-pay

## 2011-10-14 ENCOUNTER — Ambulatory Visit (HOSPITAL_COMMUNITY): Payer: BC Managed Care – PPO | Admitting: *Deleted

## 2011-10-14 NOTE — Telephone Encounter (Signed)
Called and spoke with pt mother and let her know that she needs to come pick up form.

## 2011-10-16 ENCOUNTER — Ambulatory Visit (HOSPITAL_COMMUNITY)
Admission: RE | Admit: 2011-10-16 | Discharge: 2011-10-16 | Disposition: A | Discharge status: Home / Self Care | Payer: BC Managed Care – PPO | Source: Ambulatory Visit | Attending: Family Medicine | Admitting: Family Medicine

## 2011-10-16 NOTE — Progress Notes (Signed)
Physical Therapy Treatment Patient Details  Name: Nancy [name] MRN: [id number] Date of Birth: [birth year]  Today's Date: 10/16/2011 Time: 7829-5621 Time Calculation (min): 41 min Visit#: 8  of 24   Re-eval: 10/31/11 Charges: Therex x 18' NMR x 20'  Subjective: Symptoms/Limitations Symptoms: Pt states that she if pain free. Pain Assessment Currently in Pain?: No/denies Pain Score: 0-No pain   Exercise/Treatments Standing Heel Raises: 20 reps Heel Raises Limitations: 20 toe raises Lateral Step Up: 15 reps;Both;Step Height: 4" Forward Step Up: 15 reps;Both;Step Height: 4" Functional Squat: 20 reps Rocker Board: 2 minutes Rocker Board Limitations: R/L; A/P Other Standing Knee Exercises: wall bumpe hips/shoulders x 10 each Other Standing Knee Exercises: Standing with feet together 45" max Seated Other Seated Knee Exercises: sit to stand x 10 w/o UE   Physical Therapy Assessment and Plan PT Assessment and Plan Clinical Impression Statement: Pt continues to show gains in stability, strength and balance. Pt requires frequent vc's to avoid locking knees while standing. Began wall bumps to improve propricoceptive control. Pt is without complaint througout session. PT Plan: Continue to progress per PT POC.     Problem List Patient Active Problem List  Diagnoses  . Weakness of both legs  . Difficulty in walking  . Bipolar 1 disorder  . Morbid obesity  . Leg edema    PT - End of Session Activity Tolerance: Patient tolerated treatment well General Behavior During Session: [facility] for tasks performed Cognition: [facility] for tasks performed   Seth Bake, PTA 10/16/2011, 3:02 PM

## 2011-10-18 ENCOUNTER — Inpatient Hospital Stay (HOSPITAL_COMMUNITY)
Admission: RE | Admit: 2011-10-18 | Payer: BC Managed Care – PPO | Source: Ambulatory Visit | Admitting: Physical Therapy

## 2011-10-21 ENCOUNTER — Ambulatory Visit (HOSPITAL_COMMUNITY): Payer: BC Managed Care – PPO | Admitting: *Deleted

## 2011-10-21 ENCOUNTER — Telehealth (HOSPITAL_COMMUNITY): Payer: Self-pay | Admitting: *Deleted

## 2011-10-23 ENCOUNTER — Ambulatory Visit (HOSPITAL_COMMUNITY)
Admission: RE | Admit: 2011-10-23 | Discharge: 2011-10-23 | Disposition: A | Discharge status: Home / Self Care | Payer: BC Managed Care – PPO | Source: Ambulatory Visit | Attending: Family Medicine | Admitting: Family Medicine

## 2011-10-23 NOTE — Progress Notes (Signed)
Physical Therapy Treatment Patient Details  Name: Nancy [name] MRN: [id number] Date of Birth: 1986/04/27  Today's Date: 10/23/2011 Time: 1478-2956 Time Calculation (min): 42 min Visit#: 9  of 24   Re-eval: 10/31/11 Charges: Therex x 10' NMR x 13' Gait x 15'  Subjective: Symptoms/Limitations Symptoms: I just want to be able to walk without the walker. I'm ready to go home. Pain Assessment Currently in Pain?: No/denies Pain Score: 0-No pain   Exercise/Treatments Standing Gait Training: w/SPC to with cues to improve sequence 1x7' 1x8' Other Standing Knee Exercises: wall bumpe hips x 10 each Other Standing Knee Exercises: Standing with feet together 1' Seated Other Seated Knee Exercises: sit to stand x 10 w/o UE Supine Bridges: 10 reps;Limitations (5" each)  Physical Therapy Assessment and Plan PT Assessment and Plan Clinical Impression Statement: Pt displays improved gait tolerance. Pt continues to have bouts of unsteadiness during gait. Pt able to stand with feet together x 1'. Pt unable to complete shoulder wall bumps this session. Pt complete hip wall bumps with vc's for control. Pt reports 3/10 muscle soreness at end of session. PT Plan: Continue to progress strength and balance per PT POC.    Problem List Patient Active Problem List  Diagnoses  . Weakness of both legs  . Difficulty in walking  . Bipolar 1 disorder  . Morbid obesity  . Leg edema    PT - End of Session Activity Tolerance: Patient tolerated treatment well General Behavior During Session: [facility] for tasks performed Cognition: [facility] for tasks performed    Seth Bake, PTA 10/23/2011, 1:51 PM

## 2011-10-24 ENCOUNTER — Ambulatory Visit: Payer: BC Managed Care – PPO | Admitting: Family Medicine

## 2011-10-24 ENCOUNTER — Ambulatory Visit (HOSPITAL_COMMUNITY)
Admission: RE | Admit: 2011-10-24 | Discharge: 2011-10-24 | Disposition: A | Discharge status: Home / Self Care | Payer: BC Managed Care – PPO | Source: Ambulatory Visit | Attending: Family Medicine | Admitting: Family Medicine

## 2011-10-24 NOTE — Progress Notes (Addendum)
Physical Therapy Treatment Patient Details  Name: Nancy [name] MRN: [id number] Date of Birth: 06-03-1986  Today's Date: 10/24/2011 Time: 4098-1191 Time Calculation (min): 45 min Visit#: 10  of 24   Re-eval: 10/31/11 Charges: Gait x 10' NMR x 30'  Subjective: Symptoms/Limitations Symptoms: Pt states that she is pain fee today. She was sore yesterday. Pain Assessment Currently in Pain?: No/denies Pain Score: 0-No pain   Exercise/Treatments  Standing Walking with Sports Cord: Numbers 1-15 on foam Gait Training: w/forearm crutch to with cues to improve sequence 1x10' Other Standing Knee Exercises: wall bump hips/shoulders x 10 each Other Standing Knee Exercises: Cone rotation while standing with feet together (no cones)  Physical Therapy Assessment and Plan PT Assessment and Plan Clinical Impression Statement: Pt ambulates into therapy with 1 forearm crutch. Pt reports that she is more comfortable with this than SPC. Pt ambulates well with forearm crutch with minimal unsteadiness. Pt able to complete gait training for 10' without stopping to rest. Increased difficulty of balance exercises as pt is progressing well. Pt reports no increase in pain at end of session. PT Plan: Re-eval next session.     Problem List Patient Active Problem List  Diagnoses  . Weakness of both legs  . Difficulty in walking  . Bipolar 1 disorder  . Morbid obesity  . Leg edema    PT - End of Session Activity Tolerance: Patient tolerated treatment well General Behavior During Session: [facility] for tasks performed Cognition: [facility] for tasks performed    Seth Bake, PTA 10/24/2011, 2:21 PM

## 2011-10-28 ENCOUNTER — Ambulatory Visit (HOSPITAL_COMMUNITY): Payer: BC Managed Care – PPO | Admitting: *Deleted

## 2011-10-30 ENCOUNTER — Ambulatory Visit (HOSPITAL_COMMUNITY): Payer: BC Managed Care – PPO | Admitting: Physical Therapy

## 2011-11-01 ENCOUNTER — Telehealth (HOSPITAL_COMMUNITY): Payer: Self-pay | Admitting: Physical Therapy

## 2011-11-01 ENCOUNTER — Ambulatory Visit (HOSPITAL_COMMUNITY): Payer: BC Managed Care – PPO | Admitting: Physical Therapy

## 2011-11-04 ENCOUNTER — Ambulatory Visit (INDEPENDENT_AMBULATORY_CARE_PROVIDER_SITE_OTHER): Payer: BC Managed Care – PPO | Admitting: Family Medicine

## 2011-11-04 ENCOUNTER — Encounter: Payer: Self-pay | Admitting: Family Medicine

## 2011-11-04 ENCOUNTER — Ambulatory Visit: Payer: BC Managed Care – PPO | Admitting: Family Medicine

## 2011-11-04 ENCOUNTER — Telehealth: Payer: Self-pay | Admitting: Family Medicine

## 2011-11-04 VITALS — BP 130/76 | HR 106 | Resp 20 | Ht 67.5 in | Wt 386.0 lb

## 2011-11-04 DIAGNOSIS — F319 Bipolar disorder, unspecified: Secondary | ICD-10-CM

## 2011-11-04 DIAGNOSIS — R262 Difficulty in walking, not elsewhere classified: Secondary | ICD-10-CM

## 2011-11-04 DIAGNOSIS — R29898 Other symptoms and signs involving the musculoskeletal system: Secondary | ICD-10-CM

## 2011-11-04 DIAGNOSIS — R600 Localized edema: Secondary | ICD-10-CM

## 2011-11-04 DIAGNOSIS — R609 Edema, unspecified: Secondary | ICD-10-CM

## 2011-11-04 MED ORDER — FUROSEMIDE 20 MG PO TABS: 40.0000 mg | ORAL_TABLET | Freq: Every day | ORAL | Status: DC

## 2011-11-04 MED ORDER — POTASSIUM CHLORIDE ER 10 MEQ PO TBCR: 10.0000 meq | EXTENDED_RELEASE_TABLET | Freq: Two times a day (BID) | ORAL | Status: DC

## 2011-11-04 MED ORDER — HYDROCODONE-ACETAMINOPHEN 5-500 MG PO TABS: 1.0000 | ORAL_TABLET | Freq: Two times a day (BID) | ORAL | Status: DC | PRN

## 2011-11-04 MED ORDER — RISPERIDONE 0.5 MG PO TABS: 0.5000 mg | ORAL_TABLET | Freq: Two times a day (BID) | ORAL | Status: DC

## 2011-11-04 NOTE — Telephone Encounter (Signed)
Pt cancelled appt

## 2011-11-04 NOTE — Telephone Encounter (Signed)
Was here last month (was supposed to follow up in 3 weeks) but she cancelled appt. No pain med until she comes in??

## 2011-11-04 NOTE — Patient Instructions (Signed)
F/U 4 weeks  Get the labs drawn- We will call with the results Take the potassium with the water pill Take the pain medication as needed Continue your psychiatry meds Please reschedule with psych

## 2011-11-04 NOTE — Telephone Encounter (Signed)
Pt needs to come in for appt

## 2011-11-04 NOTE — Telephone Encounter (Signed)
Pt rescheduled for 4/8 @ 2:30

## 2011-11-05 ENCOUNTER — Telehealth (HOSPITAL_COMMUNITY): Payer: Self-pay | Admitting: Dietician

## 2011-11-05 LAB — LIPID PANEL
Cholesterol: 118 mg/dL (ref 0–200)
LDL Cholesterol: 42 mg/dL (ref 0–99)
Total CHOL/HDL Ratio: 3.4 Ratio
Triglycerides: 203 mg/dL — ABNORMAL HIGH (ref <150)
VLDL: 41 mg/dL — ABNORMAL HIGH (ref 0–40)

## 2011-11-05 LAB — CBC
HCT: 38.3 % (ref 36.0–46.0)
Hemoglobin: 12.1 g/dL (ref 12.0–15.0)
MCH: 26.6 pg (ref 26.0–34.0)
MCHC: 31.6 g/dL (ref 30.0–36.0)
MCV: 84.2 fL (ref 78.0–100.0)
RDW: 13.4 % (ref 11.5–15.5)

## 2011-11-05 LAB — COMPREHENSIVE METABOLIC PANEL
ALT: 19 U/L (ref 0–35)
AST: 23 U/L (ref 0–37)
Alkaline Phosphatase: 86 U/L (ref 39–117)
BUN: 10 mg/dL (ref 6–23)
Creat: 0.63 mg/dL (ref 0.50–1.10)
Total Bilirubin: 0.4 mg/dL (ref 0.3–1.2)

## 2011-11-05 NOTE — Assessment & Plan Note (Addendum)
She is very interested and weight loss surgery. I advised her to look up the informational meetings  Where she may have this done in  [place], [place]  Congratulated on 11 pound weight loss

## 2011-11-05 NOTE — Assessment & Plan Note (Signed)
Per above much improved.

## 2011-11-05 NOTE — Assessment & Plan Note (Signed)
I still think that this is dependent edema although very mild. She is not using compression hose. I will continue her on the Lasix she will have her labs drawn today latest potassium was also sent. I think this will improve as she ambulates more and is able to leave the week.

## 2011-11-05 NOTE — Progress Notes (Signed)
  Subjective:    Patient ID: Nancy [name], female    DOB: 12/02/1985, 26 y.o.   MRN: [id number]  HPI Patient here for followup. Leg edema and leg pain persistent. She says it does go down some with Lasix that time she is taking 2 doses she does feel fatigued and tired after taking the medication and has increased leg pain therefore she has been taking potassium given by her mother. She's not had her labs drawn.  Lower extremity weakness and difficulty walking- she continues to follow with physical therapy she is now walking with a cane in the form of an arm crutch. She was able to drive herself to [place] yesterday other she has significant pain by the time she reaches destination secondary to not getting up and moving about. She thinks she is progressing very much in physical therapy. At that time she had her neurology appointment for her second opinion her father died therefore she needs a neurology appointment as well as a physical therapy appointment at week.  Bipolar-her appointment had to be rescheduled with psychiatry secondary to the above events with her father passing. She states that her Seroquel does not help very much with sleep and she's been taking an expired risperidone prescription   Review of Systems   GEN- + fatigue, fever, weight loss,weakness, recent illness HEENT- denies eye drainage, change in vision, nasal discharge, CVS- denies chest pain, palpitations RESP- denies SOB, cough, wheeze ABD- denies N/V, change in stools, abd pain GU- denies dysuria, hematuria, dribbling, incontinence MSK- + joint pain, muscle aches, injury Neuro- denies headache, dizziness, syncope, seizure activity       Objective:   Physical Exam GEN- NAD, alert and oriented x3, weight loss 11lbs  HEENT- PERRL, EOMI, non injected sclera, pink conjunctiva, MMM, oropharynx clear Neck- Supple, no JVD CVS- mild tachycardia, no murmur RESP-CTAB EXT- mild pedal edema Pulses- Radial, DP-  2+ Neuro- CNII-XII in tact, walking with arm crutch cane, steady gait but slow, slow rise from seated position, strength 4+/5 RLE 5/5 LLE, DTR symmetric, though patellar decreased some bilat, sensation in tact Psych- normal affect, not overly anxious or depressed appearing        Assessment & Plan:

## 2011-11-05 NOTE — Telephone Encounter (Signed)
Second attempt to contact. See telephone encounter on 10/10/11 for details. Sent letter to pt home via [place] Mail in attempt to contact pt to schedule appointment.

## 2011-11-05 NOTE — Assessment & Plan Note (Addendum)
Much improved. Patient has rescheduled her neurology appointment. I think after a few more weeks of physical therapy she may be obtained weight without her cane however we will see how this goes. She has been driving which is a good sign. I will recheck her in about 4 weeks. If she is doing better at that time our release her to return to work. She may not have full capacity with lifting but I'm sure there are other things that they can do at her job.

## 2011-11-05 NOTE — Assessment & Plan Note (Signed)
She is ready to move back to [facility] in either way she needs a psychiatrist therefore I've encouraged her to reschedule her appointment. I have refilled her rispiradone. She will give herself a trial of 200 mg of Seroquel as her dose is probably just too low right now. She states she has enough of this medication at home

## 2011-11-06 ENCOUNTER — Inpatient Hospital Stay (HOSPITAL_COMMUNITY)
Admission: RE | Admit: 2011-11-06 | Payer: BC Managed Care – PPO | Source: Ambulatory Visit | Admitting: Physical Therapy

## 2011-11-11 NOTE — Telephone Encounter (Signed)
Sent letter to pt home via [place] Mail in attempt to contact pt to schedule appointment.  

## 2011-11-18 NOTE — Telephone Encounter (Signed)
Pt has not responded to attempts to contact to schedule appointment. Referral filed.  

## 2011-11-21 ENCOUNTER — Encounter: Payer: Self-pay | Admitting: Family Medicine

## 2011-11-21 ENCOUNTER — Ambulatory Visit (INDEPENDENT_AMBULATORY_CARE_PROVIDER_SITE_OTHER): Payer: BC Managed Care – PPO | Admitting: Family Medicine

## 2011-11-21 VITALS — BP 134/82 | HR 78 | Resp 20 | Ht 67.5 in | Wt 385.0 lb

## 2011-11-21 DIAGNOSIS — R262 Difficulty in walking, not elsewhere classified: Secondary | ICD-10-CM

## 2011-11-21 DIAGNOSIS — R29898 Other symptoms and signs involving the musculoskeletal system: Secondary | ICD-10-CM

## 2011-11-21 DIAGNOSIS — R609 Edema, unspecified: Secondary | ICD-10-CM

## 2011-11-21 DIAGNOSIS — F319 Bipolar disorder, unspecified: Secondary | ICD-10-CM

## 2011-11-21 DIAGNOSIS — R600 Localized edema: Secondary | ICD-10-CM

## 2011-11-21 NOTE — Patient Instructions (Signed)
Pt to be released back to work Follow-up with your psychiatrist in [place]  F/U 4 months

## 2011-11-22 ENCOUNTER — Telehealth: Payer: Self-pay

## 2011-11-22 NOTE — Telephone Encounter (Signed)
You can give a work note

## 2011-11-22 NOTE — Assessment & Plan Note (Addendum)
She was unable to followup with physical therapy however based on today's exam she has been released back to work with no restrictions. She's able to ambulate

## 2011-11-22 NOTE — Assessment & Plan Note (Signed)
resolved 

## 2011-11-22 NOTE — Telephone Encounter (Signed)
Work note completed and awaiting signature.

## 2011-11-22 NOTE — Progress Notes (Signed)
  Subjective:    Patient ID: Nancy [name], female    DOB: 1985-11-09, 26 y.o.   MRN: [id number]  HPI Patient here to followup as she is ready to return to work. She is able to cannulate without any assistance for the past 2 weeks. She's been driving without any difficulty. She still has pain occasionally in her legs and her back but overall is doing well. Her leg swelling has improved. She plans to reestablish with her psychiatrist in [place].   Review of Systems    GEN- denies fatigue, fever, weight loss,weakness, recent illness HEENT- denies eye drainage, change in vision, nasal discharge, CVS- denies chest pain, palpitations RESP- denies SOB, cough, wheeze ABD- denies N/V, change in stools, abd pain GU- denies dysuria, hematuria, dribbling, incontinence MSK- + joint pain, muscle aches, injury Neuro- denies headache, dizziness, syncope, seizure activity     Objective:   Physical Exam GEN- NAD, alert and oriented x3,  HEENT- PERRL, EOMI, non injected sclera, pink conjunctiva, MMM, oropharynx clear Neck- Supple, no JVD,normal ROM CVS- RRR, no murmur RESP-CTAB abd-NABS,soft, NT,ND EXT- mild pedal edema Pulses- Radial, DP- 2+ Neuro- CNII-XII in tact, walking without assistance at good pace, non antalgic gait, able to rise from seated position without difficulty, strength 4+/5 RLE 5/5 LLE, DTR symmetric, though patellar decreased some bilat, sensation in tact Psych- normal affect, not overly anxious or depressed appearing        Assessment & Plan:

## 2011-11-22 NOTE — Assessment & Plan Note (Signed)
Continues to lose weight slowly. Continue to encourage

## 2011-11-22 NOTE — Assessment & Plan Note (Signed)
unchanged

## 2011-11-22 NOTE — Assessment & Plan Note (Signed)
The patient still using Seroquel 200 mg. States initially made her sleepy all day but now she is adjusted. She will follow with her psychiatrist in [place]

## 2011-11-25 ENCOUNTER — Telehealth: Payer: Self-pay | Admitting: Family Medicine

## 2011-11-25 NOTE — Telephone Encounter (Signed)
Note in front awaiting pick up

## 2011-11-25 NOTE — Telephone Encounter (Signed)
Faxed the letter to [phone]

## 2011-12-02 ENCOUNTER — Ambulatory Visit: Payer: BC Managed Care – PPO | Admitting: Family Medicine

## 2011-12-09 ENCOUNTER — Encounter: Payer: Self-pay | Admitting: Family Medicine

## 2011-12-24 ENCOUNTER — Telehealth: Payer: Self-pay | Admitting: Family Medicine

## 2011-12-24 NOTE — Telephone Encounter (Signed)
Called patient and left message for them to return call at the office   

## 2012-01-27 ENCOUNTER — Telehealth: Payer: Self-pay | Admitting: Family Medicine

## 2012-01-27 NOTE — Telephone Encounter (Signed)
There are no specific notes about diet plans, she can just send a release of medical records for the office notes

## 2012-01-27 NOTE — Telephone Encounter (Signed)
Is asking for any notes stating that there has been some type of diet plan in place to be sent to Dr. Jeanann Lewandowsky at [facility] who is to be doing her gastric bypass surgery.

## 2012-02-05 NOTE — Telephone Encounter (Signed)
Pt aware.

## 2012-03-12 ENCOUNTER — Ambulatory Visit: Payer: BC Managed Care – PPO | Admitting: Family Medicine

## 2012-10-22 ENCOUNTER — Emergency Department (HOSPITAL_COMMUNITY)
Admission: EM | Admit: 2012-10-22 | Discharge: 2012-10-22 | Disposition: A | Discharge status: Home / Self Care | Payer: Self-pay | Attending: Emergency Medicine | Admitting: Emergency Medicine

## 2012-10-22 ENCOUNTER — Encounter (HOSPITAL_COMMUNITY): Payer: Self-pay

## 2012-10-22 ENCOUNTER — Emergency Department (HOSPITAL_COMMUNITY): Payer: Self-pay

## 2012-10-22 DIAGNOSIS — Z9889 Other specified postprocedural states: Secondary | ICD-10-CM | POA: Insufficient documentation

## 2012-10-22 DIAGNOSIS — Z8679 Personal history of other diseases of the circulatory system: Secondary | ICD-10-CM | POA: Insufficient documentation

## 2012-10-22 DIAGNOSIS — Z87891 Personal history of nicotine dependence: Secondary | ICD-10-CM | POA: Insufficient documentation

## 2012-10-22 DIAGNOSIS — Z8742 Personal history of other diseases of the female genital tract: Secondary | ICD-10-CM | POA: Insufficient documentation

## 2012-10-22 DIAGNOSIS — Z87442 Personal history of urinary calculi: Secondary | ICD-10-CM | POA: Insufficient documentation

## 2012-10-22 DIAGNOSIS — Z8659 Personal history of other mental and behavioral disorders: Secondary | ICD-10-CM | POA: Insufficient documentation

## 2012-10-22 DIAGNOSIS — R109 Unspecified abdominal pain: Secondary | ICD-10-CM | POA: Insufficient documentation

## 2012-10-22 DIAGNOSIS — Z8669 Personal history of other diseases of the nervous system and sense organs: Secondary | ICD-10-CM | POA: Insufficient documentation

## 2012-10-22 DIAGNOSIS — Z79899 Other long term (current) drug therapy: Secondary | ICD-10-CM | POA: Insufficient documentation

## 2012-10-22 LAB — CBC WITH DIFFERENTIAL/PLATELET
Basophils Relative: 0 % (ref 0–1)
Eosinophils Relative: 1 % (ref 0–5)
HCT: 40.4 % (ref 36.0–46.0)
Hemoglobin: 13.1 g/dL (ref 12.0–15.0)
Lymphocytes Relative: 39 % (ref 12–46)
MCHC: 32.4 g/dL (ref 30.0–36.0)
MCV: 83.6 fL (ref 78.0–100.0)
Monocytes Absolute: 0.4 10*3/uL (ref 0.1–1.0)
Monocytes Relative: 5 % (ref 3–12)
Neutro Abs: 4.8 10*3/uL (ref 1.7–7.7)

## 2012-10-22 LAB — LIPASE, BLOOD: Lipase: 25 U/L (ref 11–59)

## 2012-10-22 LAB — URINALYSIS, ROUTINE W REFLEX MICROSCOPIC
Ketones, ur: NEGATIVE mg/dL
Leukocytes, UA: NEGATIVE
Nitrite: NEGATIVE
Protein, ur: NEGATIVE mg/dL
Urobilinogen, UA: 0.2 mg/dL (ref 0.0–1.0)

## 2012-10-22 LAB — COMPREHENSIVE METABOLIC PANEL
BUN: 11 mg/dL (ref 6–23)
CO2: 25 mEq/L (ref 19–32)
Chloride: 103 mEq/L (ref 96–112)
Creatinine, Ser: 0.65 mg/dL (ref 0.50–1.10)
GFR calc non Af Amer: >90 mL/min (ref >90)
Total Bilirubin: 0.3 mg/dL (ref 0.3–1.2)

## 2012-10-22 IMAGING — CR DG ABDOMEN ACUTE W/ 1V CHEST
4 series · 4 of 4 positions shown · non-contrast
Comparison: Chest x-ray [DATE].

CLINICAL DATA: Epigastric abdominal pain.

ACUTE ABDOMEN SERIES (ABDOMEN 2 VIEW & CHEST 1 VIEW)

[view not recorded (1 of 4)]
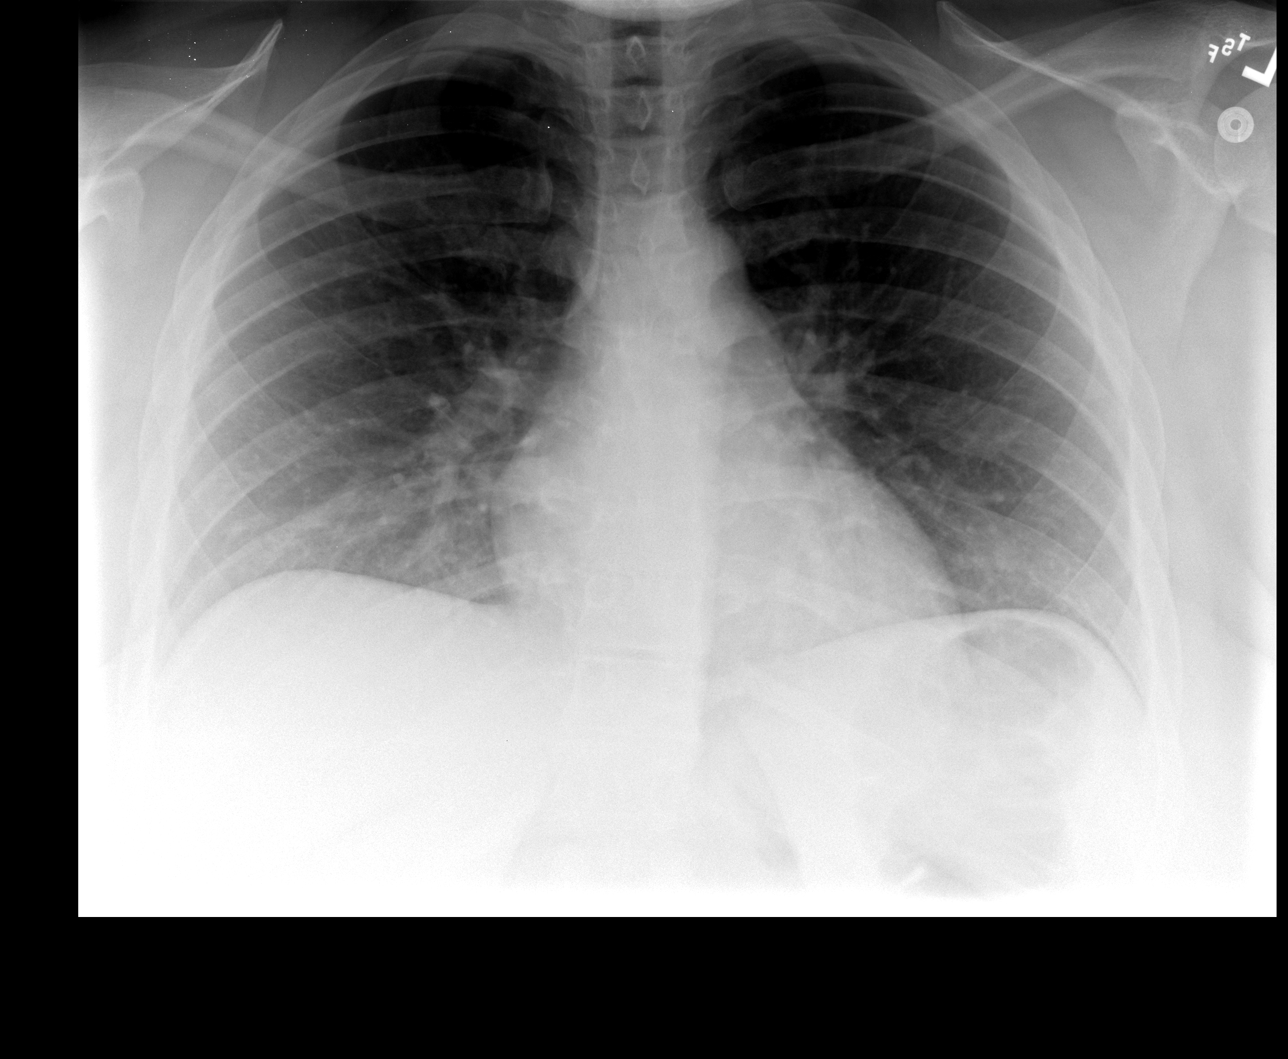

[view not recorded (2 of 4)]
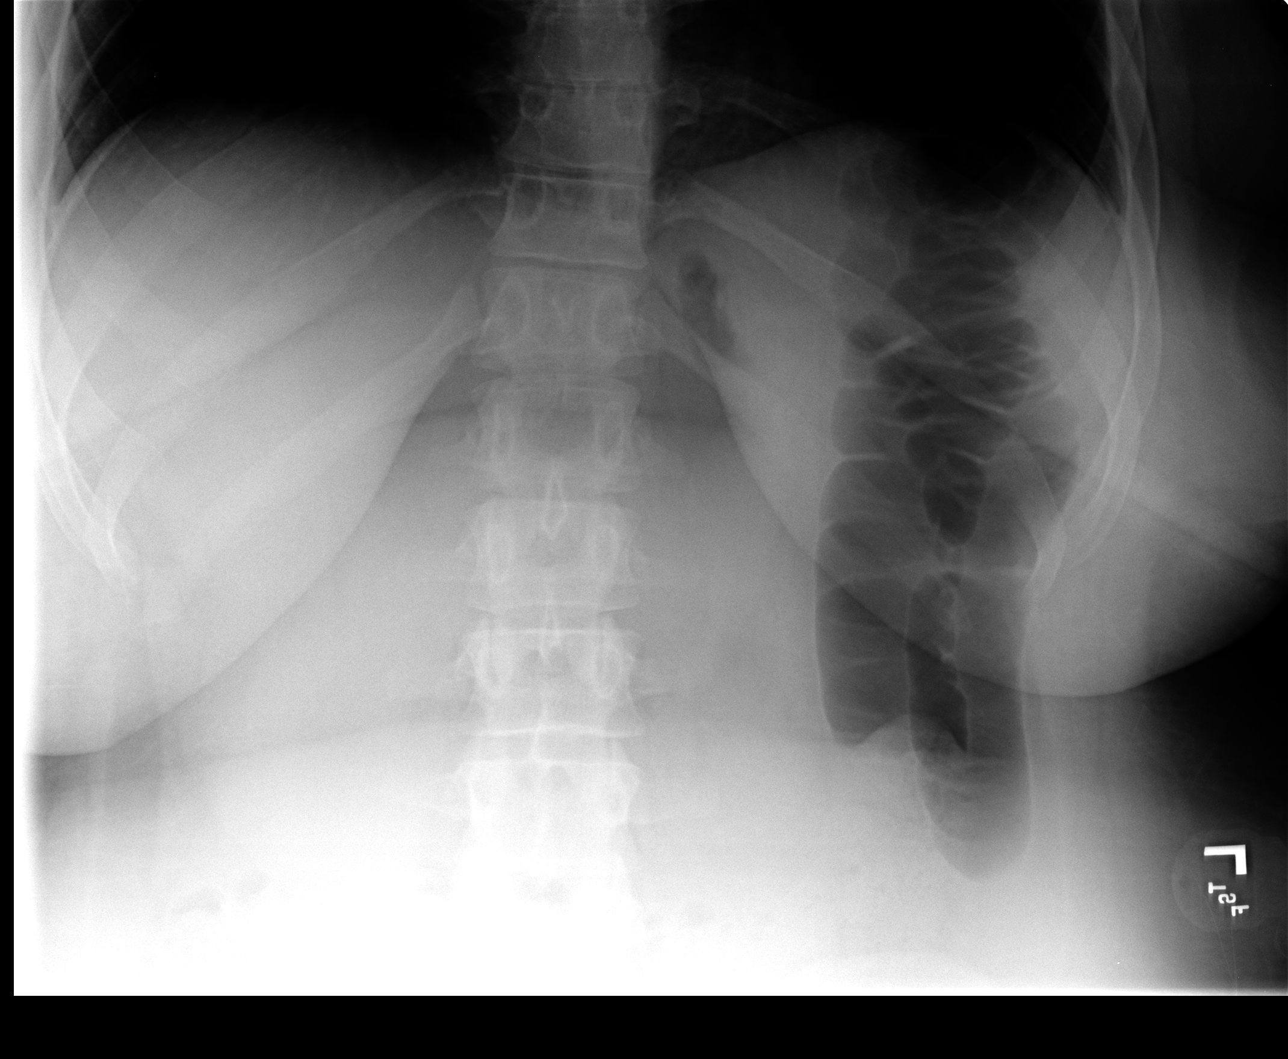

[view not recorded (3 of 4)]
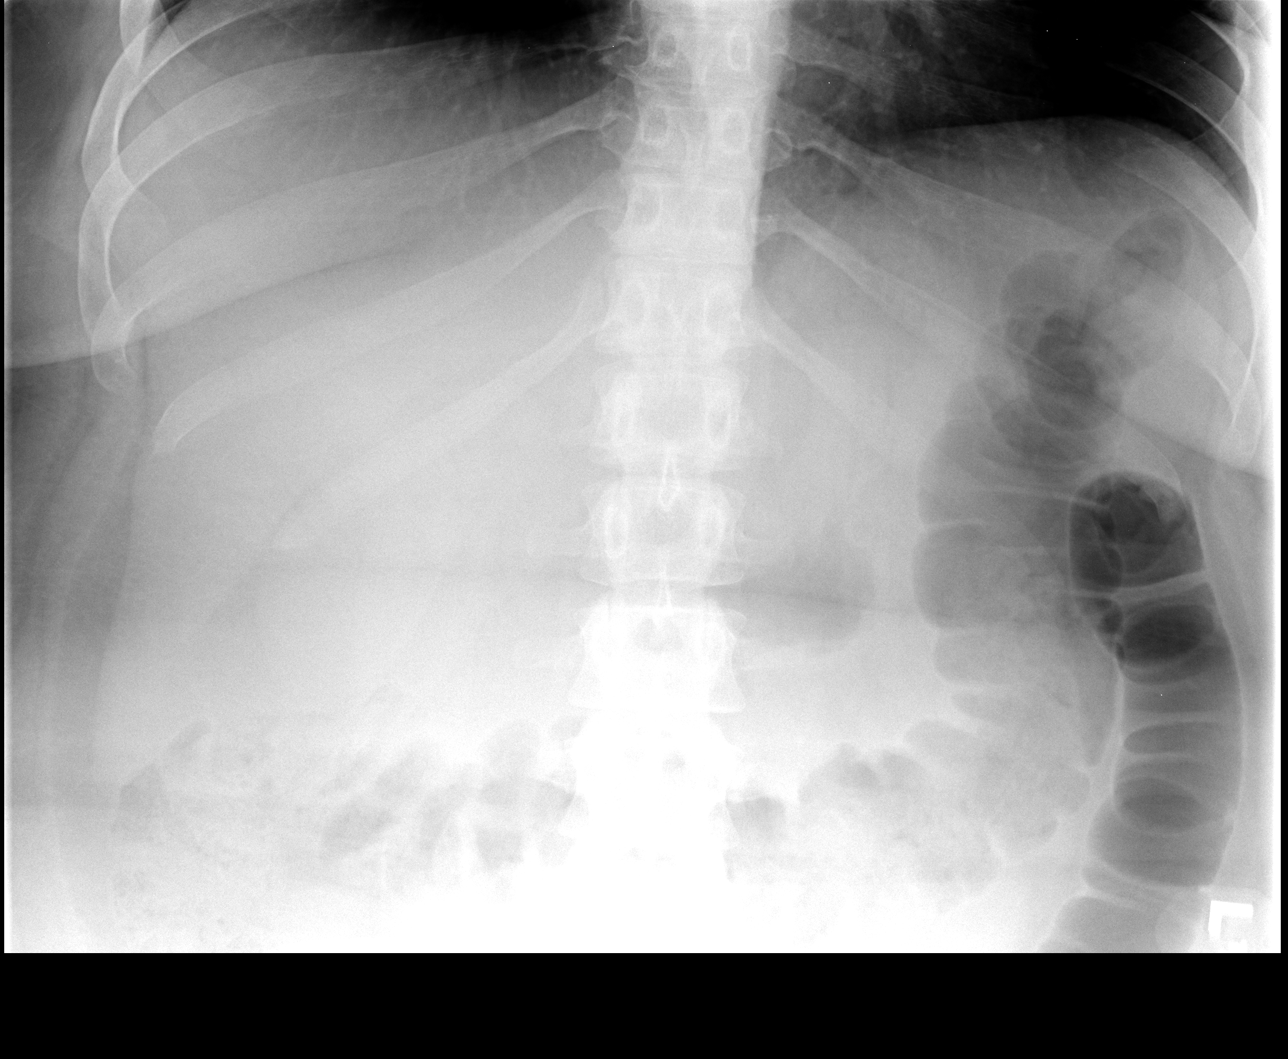

[view not recorded (4 of 4)]
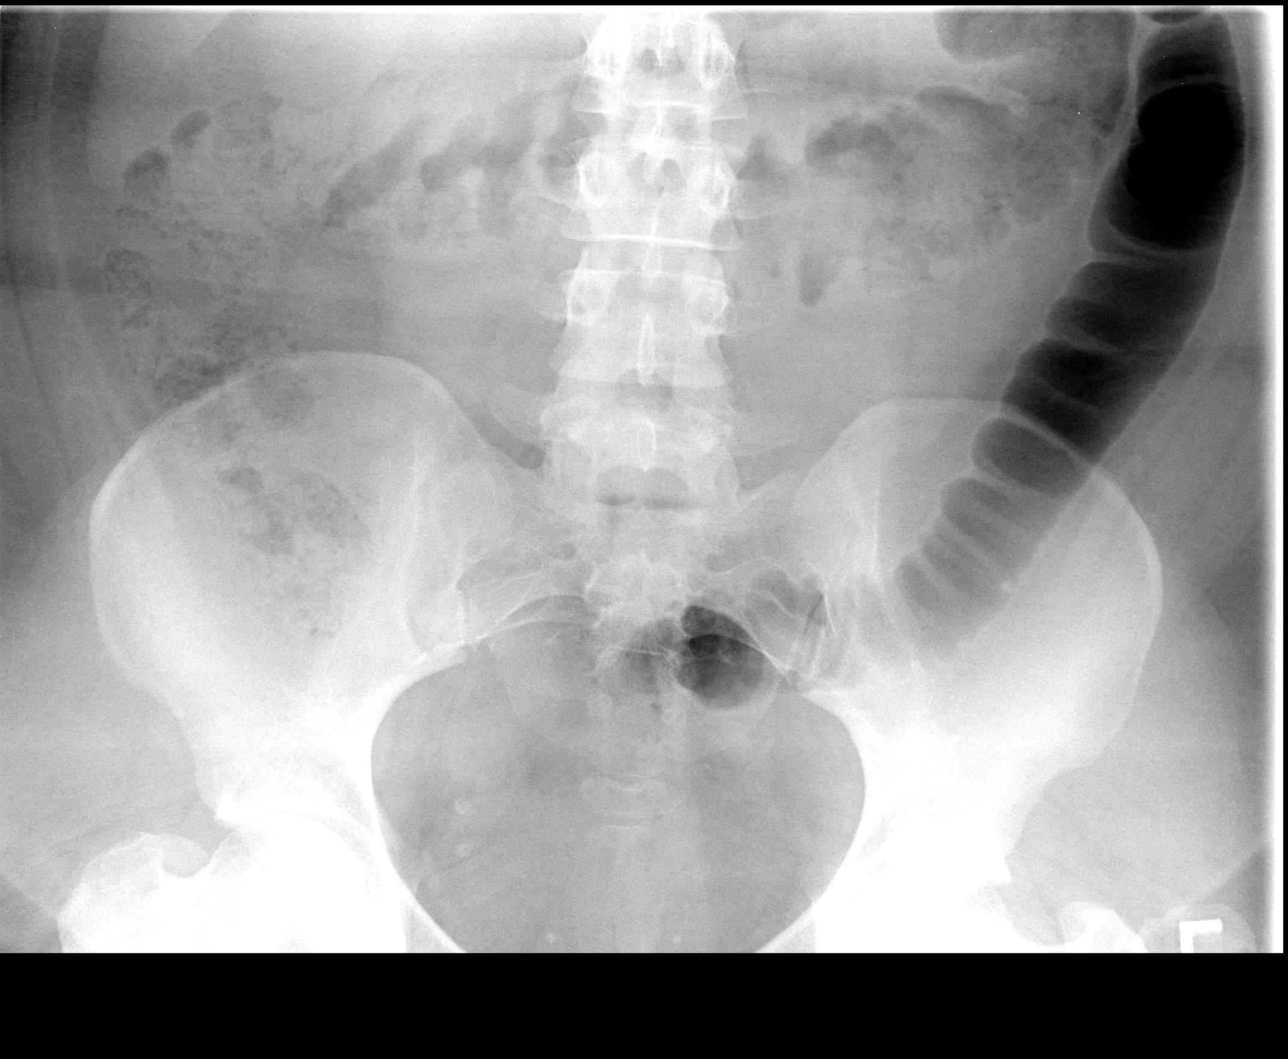

[4 of 4 positions shown; findings below may reference images not displayed]

FINDINGS: The upright chest x-ray demonstrates no acute findings.
Low lung volumes with mild vascular crowding.

Two views of the abdomen demonstrate air and stool throughout the
colon but no distended small bowel loops to suggest obstruction.
No free air.  The soft tissue shadows are maintained.  The bony
structures are intact.
IMPRESSION: 1.  No acute cardiopulmonary findings.
2.  No plain film findings for small bowel obstruction or free air.

## 2012-10-22 MED ORDER — RANITIDINE HCL 150 MG PO CAPS: 150.0000 mg | ORAL_CAPSULE | Freq: Two times a day (BID) | ORAL | Status: DC

## 2012-10-22 MED ORDER — PROMETHAZINE HCL 25 MG PO TABS: 25.0000 mg | ORAL_TABLET | Freq: Four times a day (QID) | ORAL | Status: DC | PRN

## 2012-10-22 NOTE — ED Provider Notes (Signed)
History     CSN: [id number]  Arrival date & time 10/22/12  1903   First MD Initiated Contact with Patient 10/22/12 1921      Chief Complaint  Patient presents with  . Nausea  . Emesis    (Consider location/radiation/quality/duration/timing/severity/associated sxs/prior treatment) Patient is a 27 y.o. female presenting with vomiting. The history is provided by the patient (pt states she has had nauseau for 2 weeks).  Emesis Severity:  Moderate Timing:  Constant Quality:  Undigested food Able to tolerate:  Liquids Progression:  Unchanged Chronicity:  New Recent urination:  Normal Context: not post-tussive   Associated symptoms: no abdominal pain, no diarrhea and no headaches     Past Medical History  Diagnosis Date  . Bipolar disorder   . Tachycardia   . CIN I (cervical intraepithelial neoplasia I)   . OSA (obstructive sleep apnea)   . Anxiety   . Nephrolithiasis     Past Surgical History  Procedure Laterality Date  . Bladder augmentation  2001    enlargement    Family History  Problem Relation Age of Onset  . Hypertension Mother   . Depression Mother   . Diabetes Mother   . Hypertension Father   . Diabetes Father   . Depression Father   . Hypertension Sister   . Diabetes Sister   . Depression Sister   . Depression Brother   . Diabetes Brother   . Hypertension Brother   . Cancer Maternal Aunt   . Cancer Paternal Aunt   . Hypertension Maternal Grandmother   . Diabetes Maternal Grandmother   . Depression Maternal Grandmother   . Hypertension Maternal Grandfather   . Diabetes Maternal Grandfather   . Depression Maternal Grandfather   . Hypertension Paternal Grandmother   . Diabetes Paternal Grandmother   . Depression Paternal Grandmother   . Hypertension Paternal Grandfather   . Diabetes Paternal Grandfather   . Depression Paternal Grandfather     History  Substance Use Topics  . Smoking status: Former Games developer  . Smokeless tobacco: Not on file   . Alcohol Use: No    OB History   Grav Para Term Preterm Abortions TAB SAB Ect Mult Living                  Review of Systems  Constitutional: Negative for fatigue.  HENT: Negative for congestion, sinus pressure and ear discharge.   Eyes: Negative for discharge.  Respiratory: Negative for cough.   Cardiovascular: Negative for chest pain.  Gastrointestinal: Positive for vomiting. Negative for abdominal pain and diarrhea.  Genitourinary: Negative for frequency and hematuria.  Musculoskeletal: Negative for back pain.  Skin: Negative for rash.  Neurological: Negative for seizures and headaches.  Psychiatric/Behavioral: Negative for hallucinations.    Allergies  Asa buff (mag and Nsaids  Home Medications   Current Outpatient Rx  Name  Route  Sig  Dispense  Refill  . albuterol (VENTOLIN HFA) 108 (90 BASE) MCG/ACT inhaler   Inhalation   Inhale 2 puffs into the lungs every 6 (six) hours as needed for wheezing or shortness of breath.         . prochlorperazine (COMPAZINE) 10 MG tablet   Oral   Take 10 mg by mouth daily as needed (for nausea).         . promethazine (PHENERGAN) 25 MG tablet   Oral   Take 1 tablet (25 mg total) by mouth every 6 (six) hours as needed for nausea.   20  tablet   0   . ranitidine (ZANTAC) 150 MG capsule   Oral   Take 1 capsule (150 mg total) by mouth 2 (two) times daily.   60 capsule   0     BP 153/83  Pulse 100  Temp(Src) 99.1 F (37.3 C)  Resp 20  Ht 5\' 7"  (1.702 m)  Wt 400 lb (181.439 kg)  BMI 62.63 kg/m2  SpO2 100%  LMP 09/18/2012  Physical Exam  Constitutional: She is oriented to person, place, and time. She appears well-developed.  HENT:  Head: Normocephalic and atraumatic.  Eyes: Conjunctivae and EOM are normal. No scleral icterus.  Neck: Neck supple. No thyromegaly present.  Cardiovascular: Normal rate and regular rhythm.  Exam reveals no gallop and no friction rub.   No murmur heard. Pulmonary/Chest: No stridor.  She has no wheezes. She has no rales. She exhibits no tenderness.  Abdominal: She exhibits no distension. There is no tenderness. There is no rebound.  Musculoskeletal: Normal range of motion. She exhibits no edema.  Lymphadenopathy:    She has no cervical adenopathy.  Neurological: She is oriented to person, place, and time. Coordination normal.  Skin: No rash noted. No erythema.  Psychiatric: She has a normal mood and affect. Her behavior is normal.    ED Course  Procedures (including critical care time)  Labs Reviewed  COMPREHENSIVE METABOLIC PANEL - Abnormal; Notable for the following:    Glucose, Bld 145 (*)    All other components within normal limits  URINALYSIS, ROUTINE W REFLEX MICROSCOPIC - Abnormal; Notable for the following:    Specific Gravity, Urine >1.030 (*)    All other components within normal limits  CBC WITH DIFFERENTIAL  LIPASE, BLOOD  PREGNANCY, URINE   Dg Abd Acute W/chest  10/22/2012  *RADIOLOGY REPORT*  Clinical Data: Epigastric abdominal pain.  ACUTE ABDOMEN SERIES (ABDOMEN 2 VIEW & CHEST 1 VIEW)  Comparison: Chest x-ray 10/02/2012.  Findings: The upright chest x-ray demonstrates no acute findings. Low lung volumes with mild vascular crowding.  Two views of the abdomen demonstrate air and stool throughout the colon but no distended small bowel loops to suggest obstruction. No free air.  The soft tissue shadows are maintained.  The bony structures are intact.  IMPRESSION:  1.  No acute cardiopulmonary findings. 2.  No plain film findings for small bowel obstruction or free air.   Original Report Authenticated By: Rudie Meyer, M.D.      1. Abdominal pain       MDM          Benny Lennert, MD 10/22/12 2117

## 2012-10-22 NOTE — ED Notes (Signed)
I have been sick for every second of every day for the past 2 weeks. I have been vomiting and having nausea; denies abdominal pain. Patient states that she took a pregnancy test and it was negative.

## 2012-10-23 ENCOUNTER — Ambulatory Visit (HOSPITAL_COMMUNITY): Admit: 2012-10-23 | Payer: BC Managed Care – PPO

## 2012-11-16 ENCOUNTER — Encounter (HOSPITAL_COMMUNITY): Payer: Self-pay | Admitting: Emergency Medicine

## 2012-11-16 ENCOUNTER — Emergency Department (HOSPITAL_COMMUNITY)
Admission: EM | Admit: 2012-11-16 | Discharge: 2012-11-16 | Disposition: A | Discharge status: Home / Self Care | Payer: Self-pay | Attending: Emergency Medicine | Admitting: Emergency Medicine

## 2012-11-16 DIAGNOSIS — N6459 Other signs and symptoms in breast: Secondary | ICD-10-CM | POA: Insufficient documentation

## 2012-11-16 DIAGNOSIS — Z87442 Personal history of urinary calculi: Secondary | ICD-10-CM | POA: Insufficient documentation

## 2012-11-16 DIAGNOSIS — Z3202 Encounter for pregnancy test, result negative: Secondary | ICD-10-CM | POA: Insufficient documentation

## 2012-11-16 DIAGNOSIS — Z8669 Personal history of other diseases of the nervous system and sense organs: Secondary | ICD-10-CM | POA: Insufficient documentation

## 2012-11-16 DIAGNOSIS — N6452 Nipple discharge: Secondary | ICD-10-CM

## 2012-11-16 DIAGNOSIS — Z8659 Personal history of other mental and behavioral disorders: Secondary | ICD-10-CM | POA: Insufficient documentation

## 2012-11-16 DIAGNOSIS — Z79899 Other long term (current) drug therapy: Secondary | ICD-10-CM | POA: Insufficient documentation

## 2012-11-16 DIAGNOSIS — Z8742 Personal history of other diseases of the female genital tract: Secondary | ICD-10-CM | POA: Insufficient documentation

## 2012-11-16 DIAGNOSIS — Z87891 Personal history of nicotine dependence: Secondary | ICD-10-CM | POA: Insufficient documentation

## 2012-11-16 MED ORDER — HYDROCODONE-ACETAMINOPHEN 5-325 MG PO TABS: 1.0000 | ORAL_TABLET | ORAL | Status: DC | PRN

## 2012-11-16 MED ORDER — CEPHALEXIN 250 MG PO CAPS: 250.0000 mg | ORAL_CAPSULE | Freq: Four times a day (QID) | ORAL | Status: DC

## 2012-11-16 NOTE — ED Provider Notes (Signed)
Medical screening examination/treatment/procedure(s) were performed by non-physician practitioner and as supervising physician I was immediately available for consultation/collaboration.   Brendin Situ L Haily Caley, MD 11/16/12 2323 

## 2012-11-16 NOTE — ED Provider Notes (Signed)
History     CSN: [id number]  Arrival date & time 11/16/12  1354   First MD Initiated Contact with Patient 11/16/12 1600      Chief Complaint  Patient presents with  . Breast Pain    (Consider location/radiation/quality/duration/timing/severity/associated sxs/prior treatment) HPI Nancy [name] is a 27 y.o. female who presents to the ED with breast pain. The pain is located in the left breast. The pain started 2 months ago. She complains of clear/bloody drainage from her nipple x 2 days. The history was provided by the patient.  Past Medical History  Diagnosis Date  . Bipolar disorder   . Tachycardia   . CIN I (cervical intraepithelial neoplasia I)   . OSA (obstructive sleep apnea)   . Anxiety   . Nephrolithiasis     Past Surgical History  Procedure Laterality Date  . Bladder augmentation  2001    enlargement    Family History  Problem Relation Age of Onset  . Hypertension Mother   . Depression Mother   . Diabetes Mother   . Hypertension Father   . Diabetes Father   . Depression Father   . Hypertension Sister   . Diabetes Sister   . Depression Sister   . Depression Brother   . Diabetes Brother   . Hypertension Brother   . Cancer Maternal Aunt   . Cancer Paternal Aunt   . Hypertension Maternal Grandmother   . Diabetes Maternal Grandmother   . Depression Maternal Grandmother   . Hypertension Maternal Grandfather   . Diabetes Maternal Grandfather   . Depression Maternal Grandfather   . Hypertension Paternal Grandmother   . Diabetes Paternal Grandmother   . Depression Paternal Grandmother   . Hypertension Paternal Grandfather   . Diabetes Paternal Grandfather   . Depression Paternal Grandfather     History  Substance Use Topics  . Smoking status: Former Games developer  . Smokeless tobacco: Not on file  . Alcohol Use: No    OB History   Grav Para Term Preterm Abortions TAB SAB Ect Mult Living                  Review of Systems  Constitutional:  Negative for fever and chills.  Respiratory: Negative for chest tightness.   Cardiovascular: Negative for chest pain.  Gastrointestinal: Negative for nausea, vomiting and abdominal pain.  Genitourinary: Negative for dysuria, frequency and vaginal bleeding.  Musculoskeletal: Negative for gait problem.  Skin: Negative for rash.       Drainage from left nipple  Neurological: Negative for headaches.  Psychiatric/Behavioral: The patient is not nervous/anxious.     Allergies  Asa buff (mag and Nsaids  Home Medications   Current Outpatient Rx  Name  Route  Sig  Dispense  Refill  . albuterol (VENTOLIN HFA) 108 (90 BASE) MCG/ACT inhaler   Inhalation   Inhale 2 puffs into the lungs every 6 (six) hours as needed for wheezing or shortness of breath.         . prochlorperazine (COMPAZINE) 10 MG tablet   Oral   Take 10 mg by mouth daily as needed (for nausea).         . promethazine (PHENERGAN) 25 MG tablet   Oral   Take 1 tablet (25 mg total) by mouth every 6 (six) hours as needed for nausea.   20 tablet   0   . ranitidine (ZANTAC) 150 MG capsule   Oral   Take 1 capsule (150 mg total) by mouth  2 (two) times daily.   60 capsule   0     BP 144/90  Pulse 95  Temp(Src) 99.3 F (37.4 C) (Oral)  Resp 20  Wt 400 lb (181.439 kg)  BMI 62.63 kg/m2  SpO2 100%  LMP 09/18/2012  Physical Exam  Nursing note and vitals reviewed. Constitutional: She is oriented to person, place, and time. No distress.  Morbidly obese  HENT:  Head: Normocephalic.  Eyes: EOM are normal.  Neck: Neck supple.  Cardiovascular: Normal rate and regular rhythm.   Pulmonary/Chest: Effort normal and breath sounds normal. Left breast exhibits inverted nipple, nipple discharge and tenderness.    There is mild erythema of the left breast.  Musculoskeletal: Normal range of motion.  Neurological: She is alert and oriented to person, place, and time.  Skin: Skin is warm and dry.  Psychiatric: She has a normal  mood and affect. Her behavior is normal. Judgment and thought content normal.    ED Course  Procedures  Assessment: 27 y.o. female with left breast pain and nipple drainage  Plan:  I called [facility] and they will schedule an appointment for the patient for tomorrow for ultrasound and follow up. Today I will start the patient on Keflex and pain management in the event that this is an abscess or other skin infection.   MDM  Discussed with the patient and all questioned fully answered. She is stable for discharge home without immediate complications.          [facility] Orlene Och, [place] 11/16/12 1649

## 2012-11-16 NOTE — ED Notes (Signed)
Pt has been examined by Mayer Camel.  Prior to being seen by me.

## 2012-11-16 NOTE — ED Notes (Signed)
Pt c/o left breast pain x 2 months with increased pain and clear/blooody nipple discharge x 2 days.

## 2012-11-17 ENCOUNTER — Other Ambulatory Visit: Payer: Self-pay | Admitting: Nurse Practitioner

## 2012-11-17 DIAGNOSIS — N644 Mastodynia: Secondary | ICD-10-CM

## 2012-11-17 DIAGNOSIS — N6452 Nipple discharge: Secondary | ICD-10-CM

## 2012-11-18 ENCOUNTER — Other Ambulatory Visit: Payer: Self-pay

## 2012-11-19 ENCOUNTER — Ambulatory Visit: Payer: BC Managed Care – PPO | Admitting: Family Medicine

## 2012-11-27 ENCOUNTER — Ambulatory Visit
Admission: RE | Admit: 2012-11-27 | Discharge: 2012-11-27 | Disposition: A | Discharge status: Home / Self Care | Payer: No Typology Code available for payment source | Source: Ambulatory Visit | Attending: Nurse Practitioner | Admitting: Nurse Practitioner

## 2012-11-27 DIAGNOSIS — N6452 Nipple discharge: Secondary | ICD-10-CM

## 2012-11-27 DIAGNOSIS — N644 Mastodynia: Secondary | ICD-10-CM

## 2013-02-26 ENCOUNTER — Ambulatory Visit: Payer: Self-pay | Admitting: Family Medicine

## 2013-03-04 ENCOUNTER — Other Ambulatory Visit: Payer: Self-pay

## 2013-03-04 ENCOUNTER — Emergency Department (HOSPITAL_COMMUNITY): Payer: 59

## 2013-03-04 ENCOUNTER — Emergency Department (HOSPITAL_COMMUNITY)
Admission: EM | Admit: 2013-03-04 | Discharge: 2013-03-04 | Disposition: A | Discharge status: Home / Self Care | Payer: 59 | Attending: Emergency Medicine | Admitting: Emergency Medicine

## 2013-03-04 ENCOUNTER — Encounter (HOSPITAL_COMMUNITY): Payer: Self-pay | Admitting: Emergency Medicine

## 2013-03-04 DIAGNOSIS — Z8659 Personal history of other mental and behavioral disorders: Secondary | ICD-10-CM | POA: Insufficient documentation

## 2013-03-04 DIAGNOSIS — R0789 Other chest pain: Secondary | ICD-10-CM | POA: Insufficient documentation

## 2013-03-04 DIAGNOSIS — Z87442 Personal history of urinary calculi: Secondary | ICD-10-CM | POA: Insufficient documentation

## 2013-03-04 DIAGNOSIS — Z87891 Personal history of nicotine dependence: Secondary | ICD-10-CM | POA: Insufficient documentation

## 2013-03-04 DIAGNOSIS — Z87410 Personal history of cervical dysplasia: Secondary | ICD-10-CM | POA: Insufficient documentation

## 2013-03-04 DIAGNOSIS — Z8669 Personal history of other diseases of the nervous system and sense organs: Secondary | ICD-10-CM | POA: Insufficient documentation

## 2013-03-04 DIAGNOSIS — R079 Chest pain, unspecified: Secondary | ICD-10-CM

## 2013-03-04 DIAGNOSIS — Z79899 Other long term (current) drug therapy: Secondary | ICD-10-CM | POA: Insufficient documentation

## 2013-03-04 DIAGNOSIS — R002 Palpitations: Secondary | ICD-10-CM | POA: Insufficient documentation

## 2013-03-04 DIAGNOSIS — R110 Nausea: Secondary | ICD-10-CM | POA: Insufficient documentation

## 2013-03-04 DIAGNOSIS — Z8679 Personal history of other diseases of the circulatory system: Secondary | ICD-10-CM | POA: Insufficient documentation

## 2013-03-04 LAB — CBC
MCH: 27.8 pg (ref 26.0–34.0)
MCHC: 33.3 g/dL (ref 30.0–36.0)
MCV: 83.3 fL (ref 78.0–100.0)
Platelets: 212 10*3/uL (ref 150–400)
RDW: 14 % (ref 11.5–15.5)

## 2013-03-04 LAB — POCT I-STAT, CHEM 8
BUN: 9 mg/dL (ref 6–23)
Chloride: 109 mEq/L (ref 96–112)
HCT: 36 % (ref 36.0–46.0)
Sodium: 143 mEq/L (ref 135–145)
TCO2: 23 mmol/L (ref 0–100)

## 2013-03-04 LAB — COMPREHENSIVE METABOLIC PANEL
AST: 23 U/L (ref 0–37)
Albumin: 3.3 g/dL — ABNORMAL LOW (ref 3.5–5.2)
CO2: 22 mEq/L (ref 19–32)
Calcium: 9 mg/dL (ref 8.4–10.5)
Creatinine, Ser: 0.59 mg/dL (ref 0.50–1.10)
GFR calc non Af Amer: >90 mL/min (ref >90)

## 2013-03-04 LAB — POCT I-STAT TROPONIN I

## 2013-03-04 LAB — D-DIMER, QUANTITATIVE: D-Dimer, Quant: 0.68 ug/mL-FEU — ABNORMAL HIGH (ref 0.00–0.48)

## 2013-03-04 LAB — HCG, SERUM, QUALITATIVE: Preg, Serum: NEGATIVE

## 2013-03-04 IMAGING — CT CT ANGIO CHEST
2 of 10 series · 17 of 46 positions shown · IV contrast (APPLIED)
Comparison: Chest CT [DATE].

CLINICAL DATA: Left-sided chest pain with inspiration.  Shortness
of breath.

CT ANGIOGRAPHY CHEST
TECHNIQUE: Multidetector CT imaging of the chest using the
standard protocol during bolus administration of intravenous
contrast. Multiplanar reconstructed images including MIPs were
obtained and reviewed to evaluate the vascular anatomy.
Contrast: 80mL OMNIPAQUE IOHEXOL 350 MG/ML SOLN

[Series 7: thins · axial · 0.98mm/px · z∈[-120,+52]mm · 15 of 241 slices shown]
[im 13/241  lung]
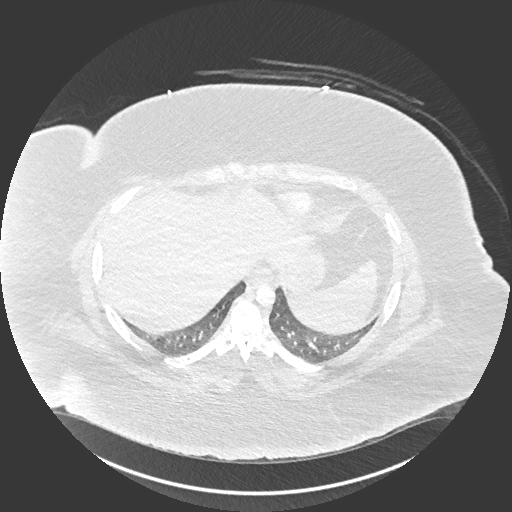
[im 26/241  soft-tissue]
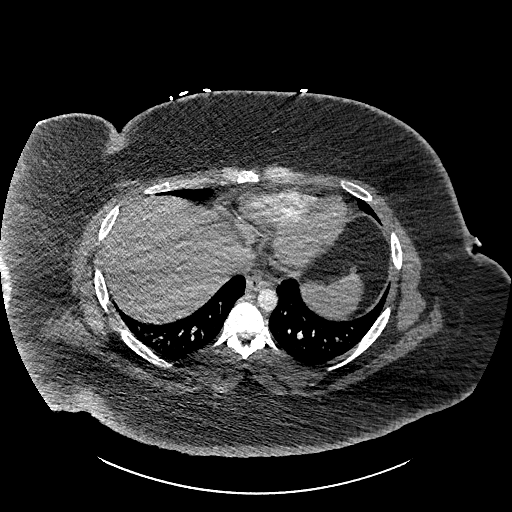
[im 51/241  lung]
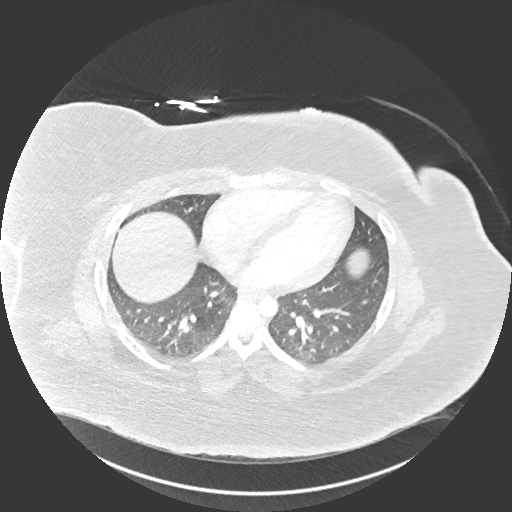
[im 64/241  soft-tissue]
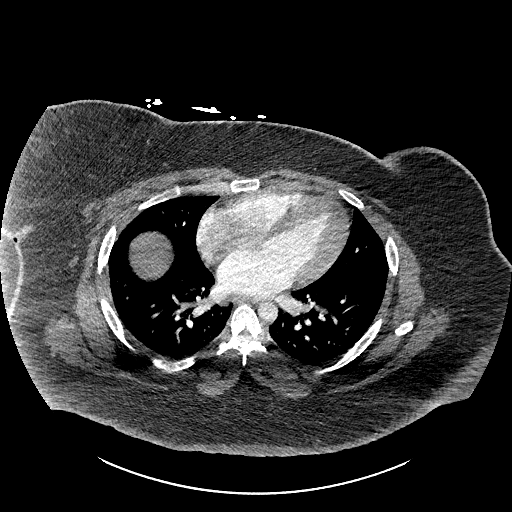
[im 76/241  lung]
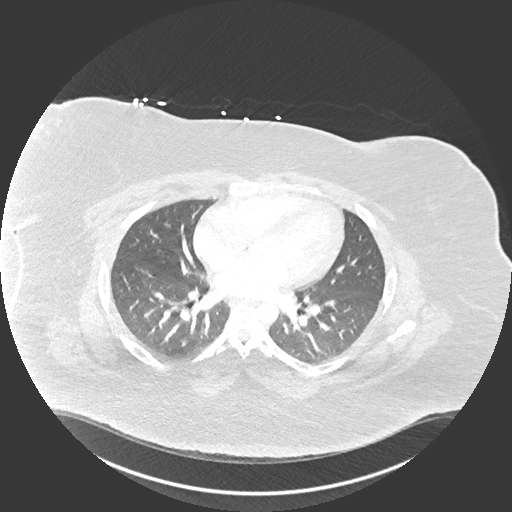
[im 89/241  soft-tissue]
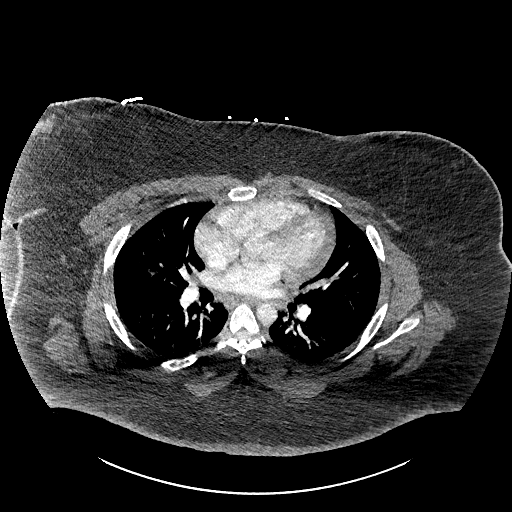
[im 102/241  lung]
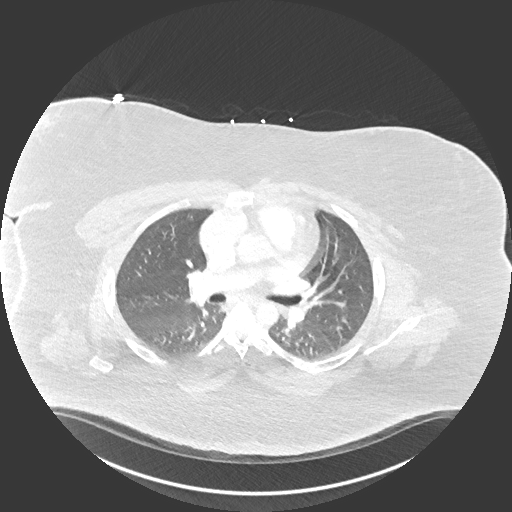
[im 127/241  soft-tissue]
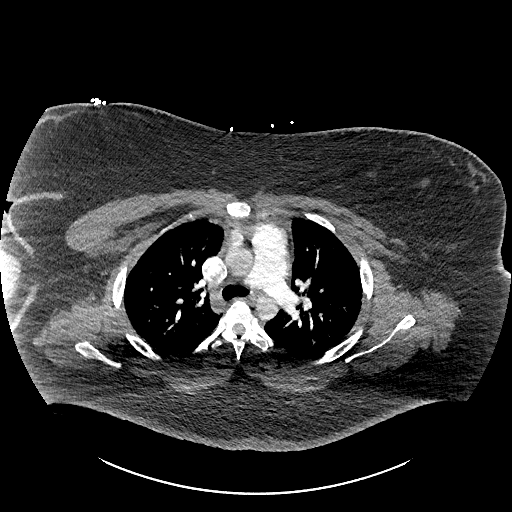
[im 139/241  lung]
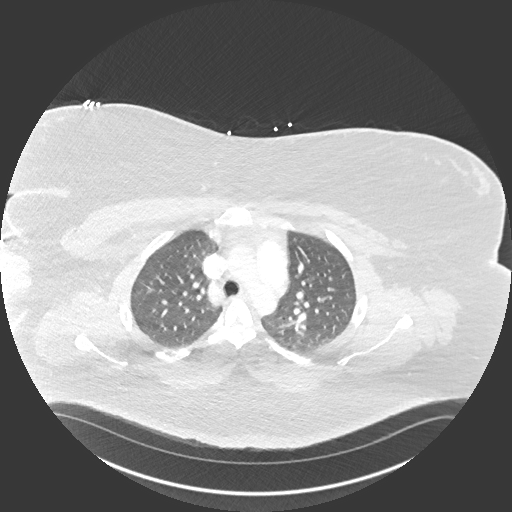
[im 152/241  soft-tissue]
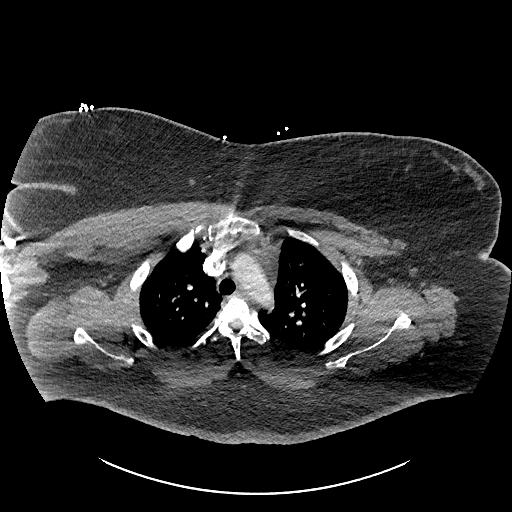
[im 165/241  lung]
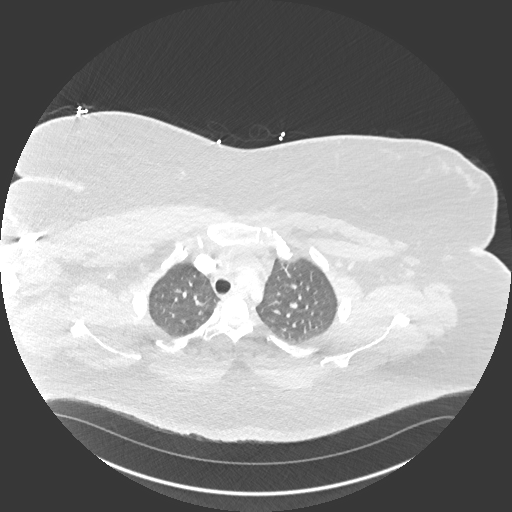
[im 177/241  soft-tissue]
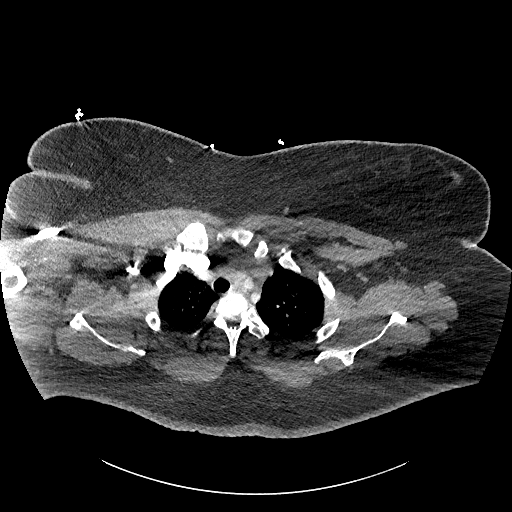
[im 203/241  lung]
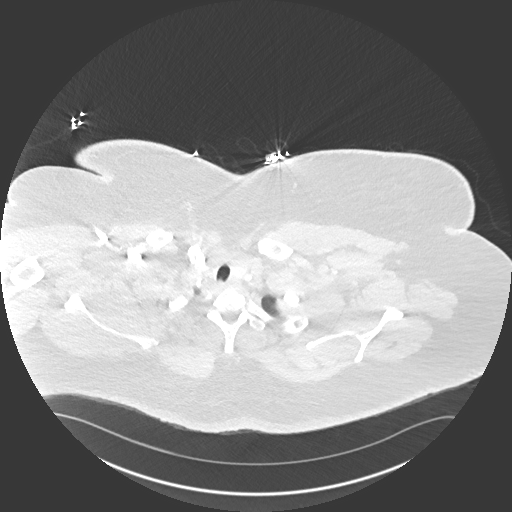
[im 215/241  soft-tissue]
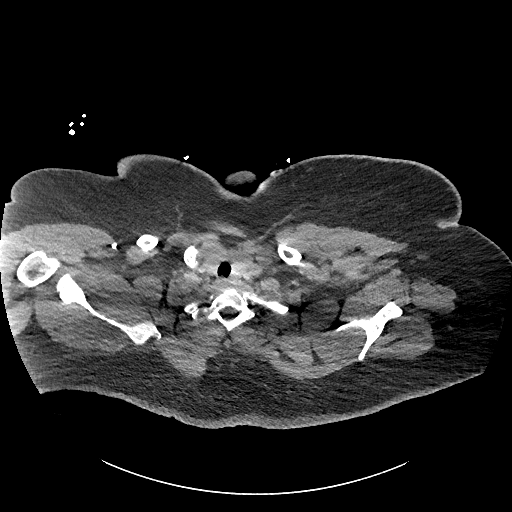
[im 228/241  lung]
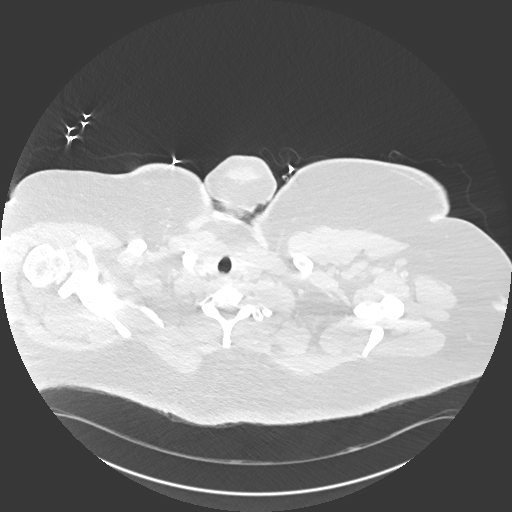

[Series 9: coronal mpr · coronal · 0.38mm/px · 2 of 83 slices shown]
[im 28/83  soft-tissue]
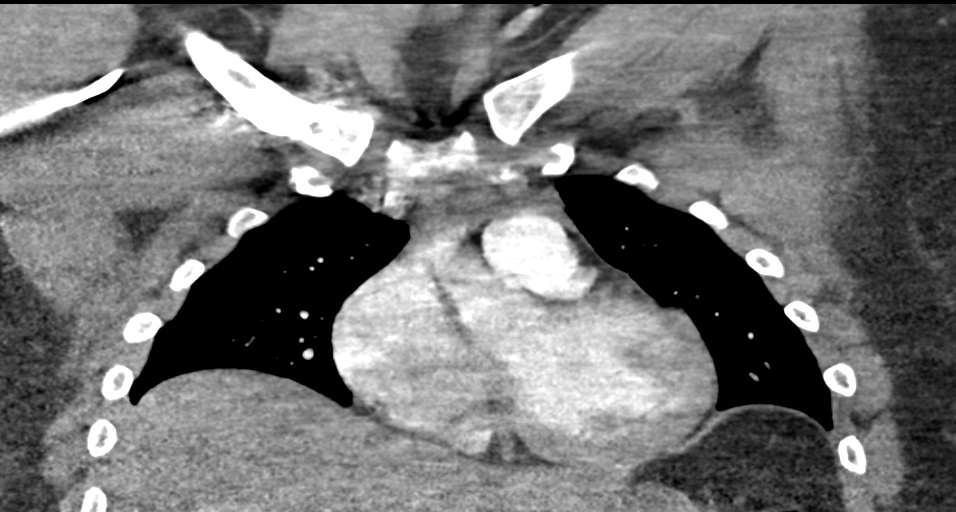
[im 55/83  soft-tissue]
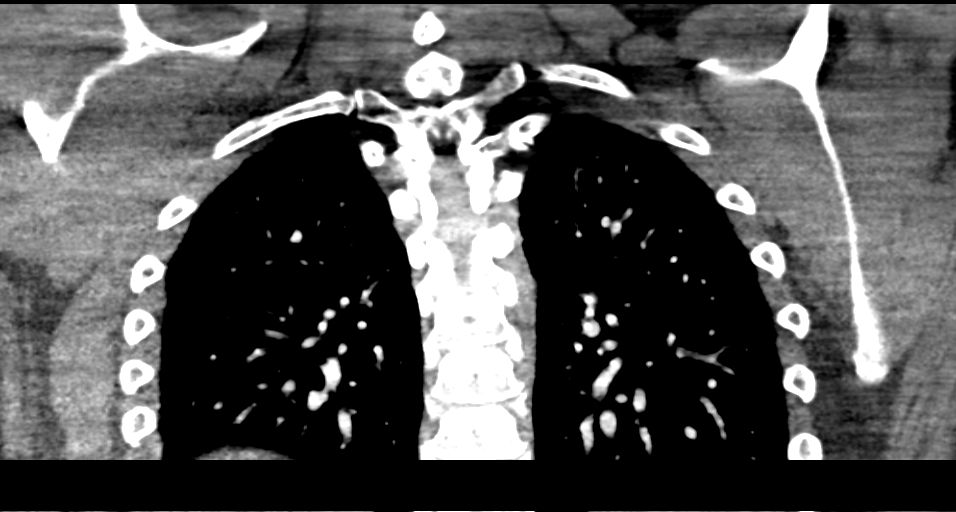

[17 of 46 positions shown; findings below may reference images not displayed]

FINDINGS: Mediastinum: Study is limited by excessive image noise from the
patient's large body habitus.  With these limitations in mind,
there is no evidence of central, lobar or segmental sized filling
defects to suggest clinically relevant pulmonary embolism.
Unfortunately, smaller subsegmental sized filling defects cannot be
completely excluded. Heart size is borderline enlarged. There is no
significant pericardial fluid, thickening or pericardial
calcification. No pathologically enlarged mediastinal or hilar
lymph nodes. Esophagus is unremarkable in appearance.

Lungs/Pleura: No acute consolidative air space disease.  No pleural
effusions.  No pneumothorax.  No suspicious appearing pulmonary
nodules or masses are identified at this time.

Upper Abdomen: Unremarkable.

Musculoskeletal: There are no aggressive appearing lytic or blastic
lesions noted in the visualized portions of the skeleton.
IMPRESSION: 1.  Despite the limitations of today's examination there is no
evidence to suggest clinically relevant central, lobar or segmental
sized pulmonary embolism.
2.  No acute findings in the thorax to account for the patient's
symptoms.
3.  Borderline cardiomegaly.

## 2013-03-04 IMAGING — CR DG CHEST 2V
2 series · 2 of 2 positions shown · non-contrast
Comparison: None.

CLINICAL DATA: Cough

CHEST - 2 VIEW

[w chest pa]
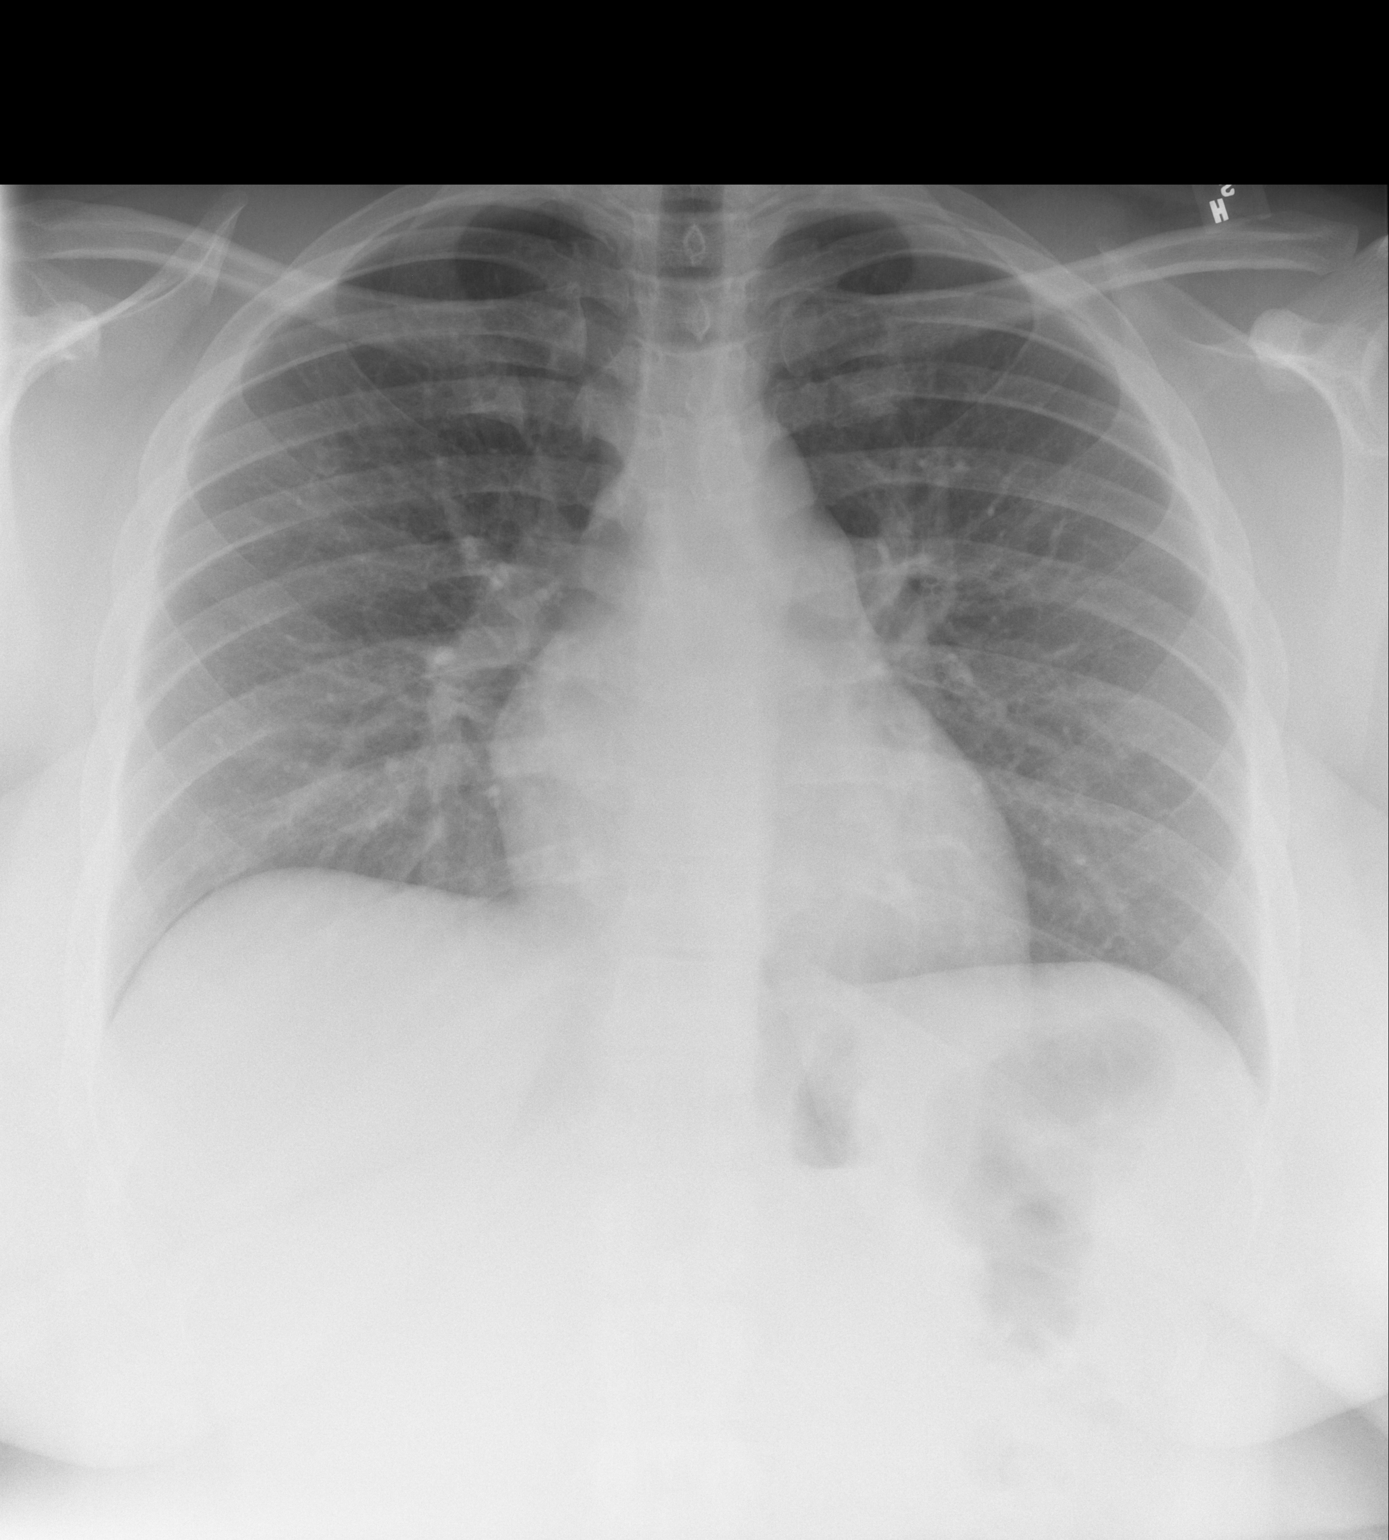

[w chest lat]
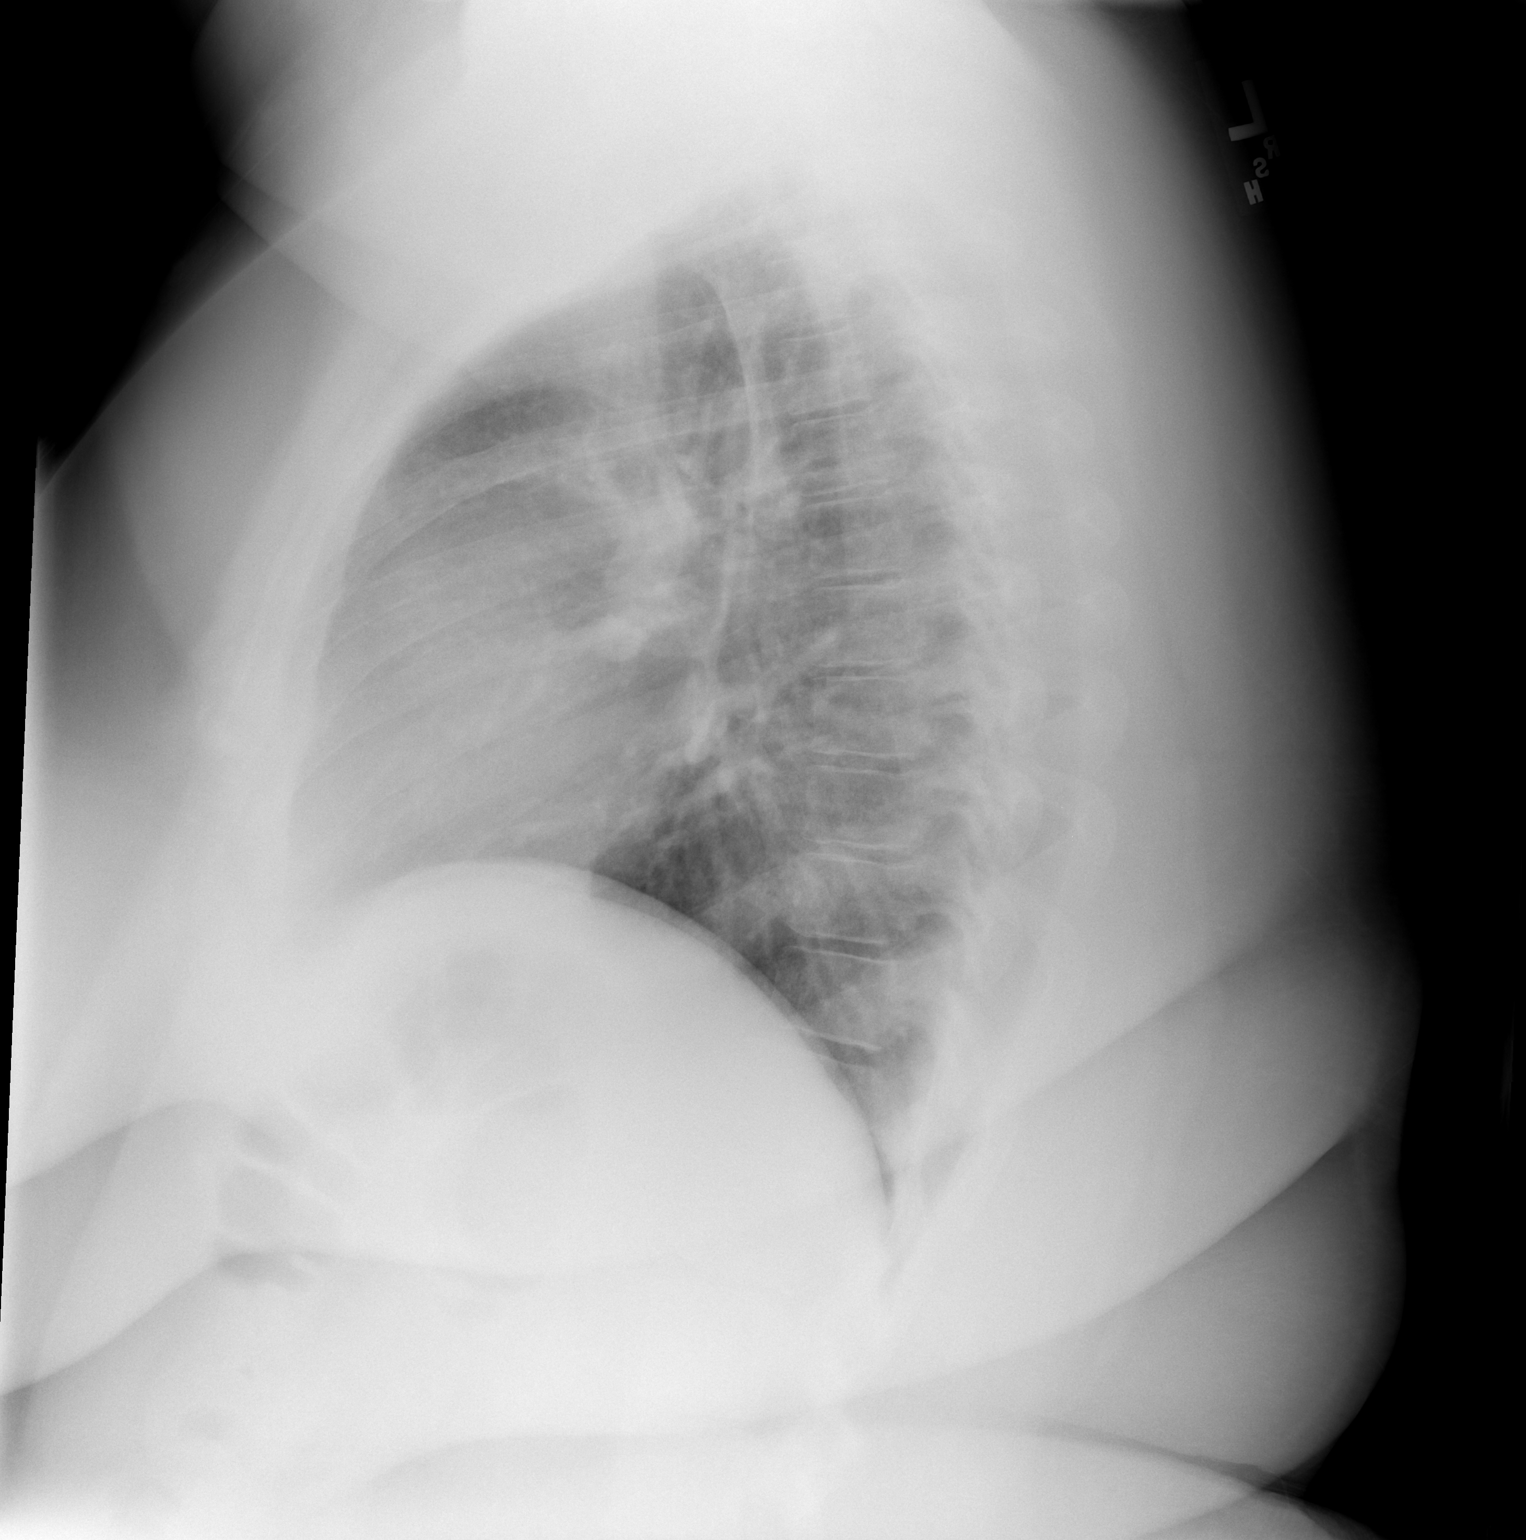

[2 of 2 positions shown; findings below may reference images not displayed]

FINDINGS: The heart and pulmonary vascularity are within normal
limits.  The lungs are clear bilaterally.  No focal infiltrate is
seen.  No bony abnormality is noted.
IMPRESSION: No acute abnormalities seen.

## 2013-03-04 MED ORDER — MORPHINE SULFATE 4 MG/ML IJ SOLN
4.0000 mg | Freq: Once | INTRAMUSCULAR | Status: AC
  Administered 2013-03-04: 4 mg via INTRAVENOUS
  Filled 2013-03-04: qty 1

## 2013-03-04 MED ORDER — HYDROCODONE-ACETAMINOPHEN 5-325 MG PO TABS: 1.0000 | ORAL_TABLET | Freq: Four times a day (QID) | ORAL | Status: DC | PRN

## 2013-03-04 MED ORDER — ONDANSETRON HCL 4 MG/2ML IJ SOLN
4.0000 mg | Freq: Once | INTRAMUSCULAR | Status: AC
  Administered 2013-03-04: 4 mg via INTRAVENOUS
  Filled 2013-03-04: qty 2

## 2013-03-04 MED ORDER — IOHEXOL 350 MG/ML SOLN
80.0000 mL | Freq: Once | INTRAVENOUS | Status: AC | PRN
  Administered 2013-03-04: 80 mL via INTRAVENOUS

## 2013-03-04 MED ORDER — HYDROCODONE-ACETAMINOPHEN 5-325 MG PO TABS
1.0000 | ORAL_TABLET | Freq: Once | ORAL | Status: AC
  Administered 2013-03-04: 1 via ORAL
  Filled 2013-03-04: qty 1

## 2013-03-04 NOTE — ED Notes (Signed)
Cp that started last night w/ nausea  It has gotten worse  Sob on exertion

## 2013-03-04 NOTE — ED Provider Notes (Signed)
CSN: [id number]     Arrival date & time 03/04/13  1252 History     First MD Initiated Contact with Patient 03/04/13 1403     Chief Complaint  Patient presents with  . Chest Pain   (Consider location/radiation/quality/duration/timing/severity/associated sxs/prior Treatment) HPI Comments: Patient reports sharp left sided chest pain that does not radiate, has been constant and gradually worsening since yesterday afternoon.  States it started off as a light pain.  Pain is reproducible and exacerbated with palpation and deep inspiration.  Associated nausea.  Denies fevers, chills, body aches, recent illness, cough, SOB, leg swelling.  No recent immobilization or exogenous estrogen.  No personal hx blood clots but mother died from PE.    Patient is a 27 y.o. female presenting with chest pain. The history is provided by the patient.  Chest Pain Associated symptoms: no abdominal pain, no cough, no fever, no nausea, no shortness of breath and not vomiting     Past Medical History  Diagnosis Date  . Bipolar disorder   . Tachycardia   . CIN I (cervical intraepithelial neoplasia I)   . OSA (obstructive sleep apnea)   . Anxiety   . Nephrolithiasis    Past Surgical History  Procedure Laterality Date  . Bladder augmentation  2001    enlargement   Family History  Problem Relation Age of Onset  . Hypertension Mother   . Depression Mother   . Diabetes Mother   . Hypertension Father   . Diabetes Father   . Depression Father   . Hypertension Sister   . Diabetes Sister   . Depression Sister   . Depression Brother   . Diabetes Brother   . Hypertension Brother   . Cancer Maternal Aunt   . Cancer Paternal Aunt   . Hypertension Maternal Grandmother   . Diabetes Maternal Grandmother   . Depression Maternal Grandmother   . Hypertension Maternal Grandfather   . Diabetes Maternal Grandfather   . Depression Maternal Grandfather   . Hypertension Paternal Grandmother   . Diabetes Paternal  Grandmother   . Depression Paternal Grandmother   . Hypertension Paternal Grandfather   . Diabetes Paternal Grandfather   . Depression Paternal Grandfather    History  Substance Use Topics  . Smoking status: Former Games developer  . Smokeless tobacco: Not on file  . Alcohol Use: No   OB History   Grav Para Term Preterm Abortions TAB SAB Ect Mult Living                 Review of Systems  Constitutional: Negative for fever.  Respiratory: Negative for cough and shortness of breath.   Cardiovascular: Positive for chest pain. Negative for leg swelling.  Gastrointestinal: Negative for nausea, vomiting, abdominal pain and diarrhea.  Genitourinary: Negative for dysuria, urgency, frequency, vaginal bleeding and vaginal discharge.    Allergies  Asa buff (mag and Nsaids  Home Medications   Current Outpatient Rx  Name  Route  Sig  Dispense  Refill  . albuterol (VENTOLIN HFA) 108 (90 BASE) MCG/ACT inhaler   Inhalation   Inhale 2 puffs into the lungs every 6 (six) hours as needed for wheezing or shortness of breath.         . cephALEXin (KEFLEX) 250 MG capsule   Oral   Take 1 capsule (250 mg total) by mouth 4 (four) times daily.   28 capsule   0   . HYDROcodone-acetaminophen (NORCO/VICODIN) 5-325 MG per tablet   Oral   Take  1 tablet by mouth every 4 (four) hours as needed.   10 tablet   0   . prochlorperazine (COMPAZINE) 10 MG tablet   Oral   Take 10 mg by mouth daily as needed (for nausea).         . promethazine (PHENERGAN) 25 MG tablet   Oral   Take 1 tablet (25 mg total) by mouth every 6 (six) hours as needed for nausea.   20 tablet   0   . ranitidine (ZANTAC) 150 MG capsule   Oral   Take 1 capsule (150 mg total) by mouth 2 (two) times daily.   60 capsule   0    BP 143/79  Pulse 105  Temp(Src) 98.5 F (36.9 C)  Resp 16  LMP 03/01/2013 Physical Exam  Nursing note and vitals reviewed. Constitutional: She appears well-developed and well-nourished. No distress.   obese  HENT:  Head: Normocephalic and atraumatic.  Neck: Neck supple.  Cardiovascular: Normal rate, regular rhythm and intact distal pulses.   Pulmonary/Chest: Effort normal and breath sounds normal. No respiratory distress. She has no wheezes. She has no rales. She exhibits tenderness.  Left chest wall tenderness, reproducible with palpation.    Abdominal: Soft. She exhibits no distension. There is no tenderness. There is no rebound and no guarding.  Musculoskeletal: She exhibits no edema.  Neurological: She is alert.  Skin: She is not diaphoretic.    ED Course   Procedures (including critical care time)  Labs Reviewed  COMPREHENSIVE METABOLIC PANEL - Abnormal; Notable for the following:    Albumin 3.3 (*)    All other components within normal limits  D-DIMER, QUANTITATIVE - Abnormal; Notable for the following:    D-Dimer, Quant 0.68 (*)    All other components within normal limits  CBC  URINALYSIS, ROUTINE W REFLEX MICROSCOPIC  HCG, SERUM, QUALITATIVE  POCT I-STAT TROPONIN I  POCT I-STAT, CHEM 8   Dg Chest 2 View  03/04/2013   *RADIOLOGY REPORT*  Clinical Data: Cough  CHEST - 2 VIEW  Comparison: None.  Findings: The heart and pulmonary vascularity are within normal limits.  The lungs are clear bilaterally.  No focal infiltrate is seen.  No bony abnormality is noted.  IMPRESSION: No acute abnormalities seen.   Original Report Authenticated By: Alcide Clever, M.D.   No diagnosis found.  MDM  Pt with left sided sharp chest pain worse with palpation and deep inspiration, reproducible with palpation.  Associated nausea.  D-dimer is positive.  CT angio chest pending at change of shift.  Also pending pregnancy test.  Discussed pt with Dr Rhunette Croft who assumes care of patient at change of shift.  Chest wall pain vs PE.  If CT negative, pt to be discharged home.       [place], PA-C 03/04/13 1607

## 2013-03-06 NOTE — ED Provider Notes (Signed)
Medical screening examination/treatment/procedure(s) were performed by non-physician practitioner and as supervising physician I was immediately available for consultation/collaboration.   Gerhard Munch, MD 03/06/13 [phone]

## 2013-05-20 ENCOUNTER — Emergency Department (HOSPITAL_COMMUNITY)
Admission: EM | Admit: 2013-05-20 | Discharge: 2013-05-20 | Disposition: A | Discharge status: Home / Self Care | Payer: 59 | Attending: Emergency Medicine | Admitting: Emergency Medicine

## 2013-05-20 ENCOUNTER — Emergency Department (HOSPITAL_COMMUNITY): Payer: 59

## 2013-05-20 ENCOUNTER — Encounter (HOSPITAL_COMMUNITY): Payer: Self-pay

## 2013-05-20 DIAGNOSIS — R110 Nausea: Secondary | ICD-10-CM | POA: Insufficient documentation

## 2013-05-20 DIAGNOSIS — Z8679 Personal history of other diseases of the circulatory system: Secondary | ICD-10-CM | POA: Insufficient documentation

## 2013-05-20 DIAGNOSIS — F319 Bipolar disorder, unspecified: Secondary | ICD-10-CM | POA: Insufficient documentation

## 2013-05-20 DIAGNOSIS — F411 Generalized anxiety disorder: Secondary | ICD-10-CM | POA: Insufficient documentation

## 2013-05-20 DIAGNOSIS — R079 Chest pain, unspecified: Secondary | ICD-10-CM | POA: Insufficient documentation

## 2013-05-20 DIAGNOSIS — Z888 Allergy status to other drugs, medicaments and biological substances status: Secondary | ICD-10-CM | POA: Insufficient documentation

## 2013-05-20 DIAGNOSIS — Z87891 Personal history of nicotine dependence: Secondary | ICD-10-CM | POA: Insufficient documentation

## 2013-05-20 DIAGNOSIS — R51 Headache: Secondary | ICD-10-CM | POA: Insufficient documentation

## 2013-05-20 DIAGNOSIS — M542 Cervicalgia: Secondary | ICD-10-CM | POA: Insufficient documentation

## 2013-05-20 DIAGNOSIS — Z8742 Personal history of other diseases of the female genital tract: Secondary | ICD-10-CM | POA: Insufficient documentation

## 2013-05-20 DIAGNOSIS — G4733 Obstructive sleep apnea (adult) (pediatric): Secondary | ICD-10-CM | POA: Insufficient documentation

## 2013-05-20 DIAGNOSIS — Z87442 Personal history of urinary calculi: Secondary | ICD-10-CM | POA: Insufficient documentation

## 2013-05-20 DIAGNOSIS — Z3202 Encounter for pregnancy test, result negative: Secondary | ICD-10-CM | POA: Insufficient documentation

## 2013-05-20 DIAGNOSIS — R42 Dizziness and giddiness: Secondary | ICD-10-CM | POA: Insufficient documentation

## 2013-05-20 HISTORY — DX: Cardiomegaly: I51.7

## 2013-05-20 LAB — POCT PREGNANCY, URINE: Preg Test, Ur: NEGATIVE

## 2013-05-20 IMAGING — CR DG CHEST 2V
2 series · 2 of 2 positions shown · non-contrast
Comparison: [DATE]

CLINICAL DATA: Chest pain

EXAM:
CHEST  2 VIEW

[w chest pa *]
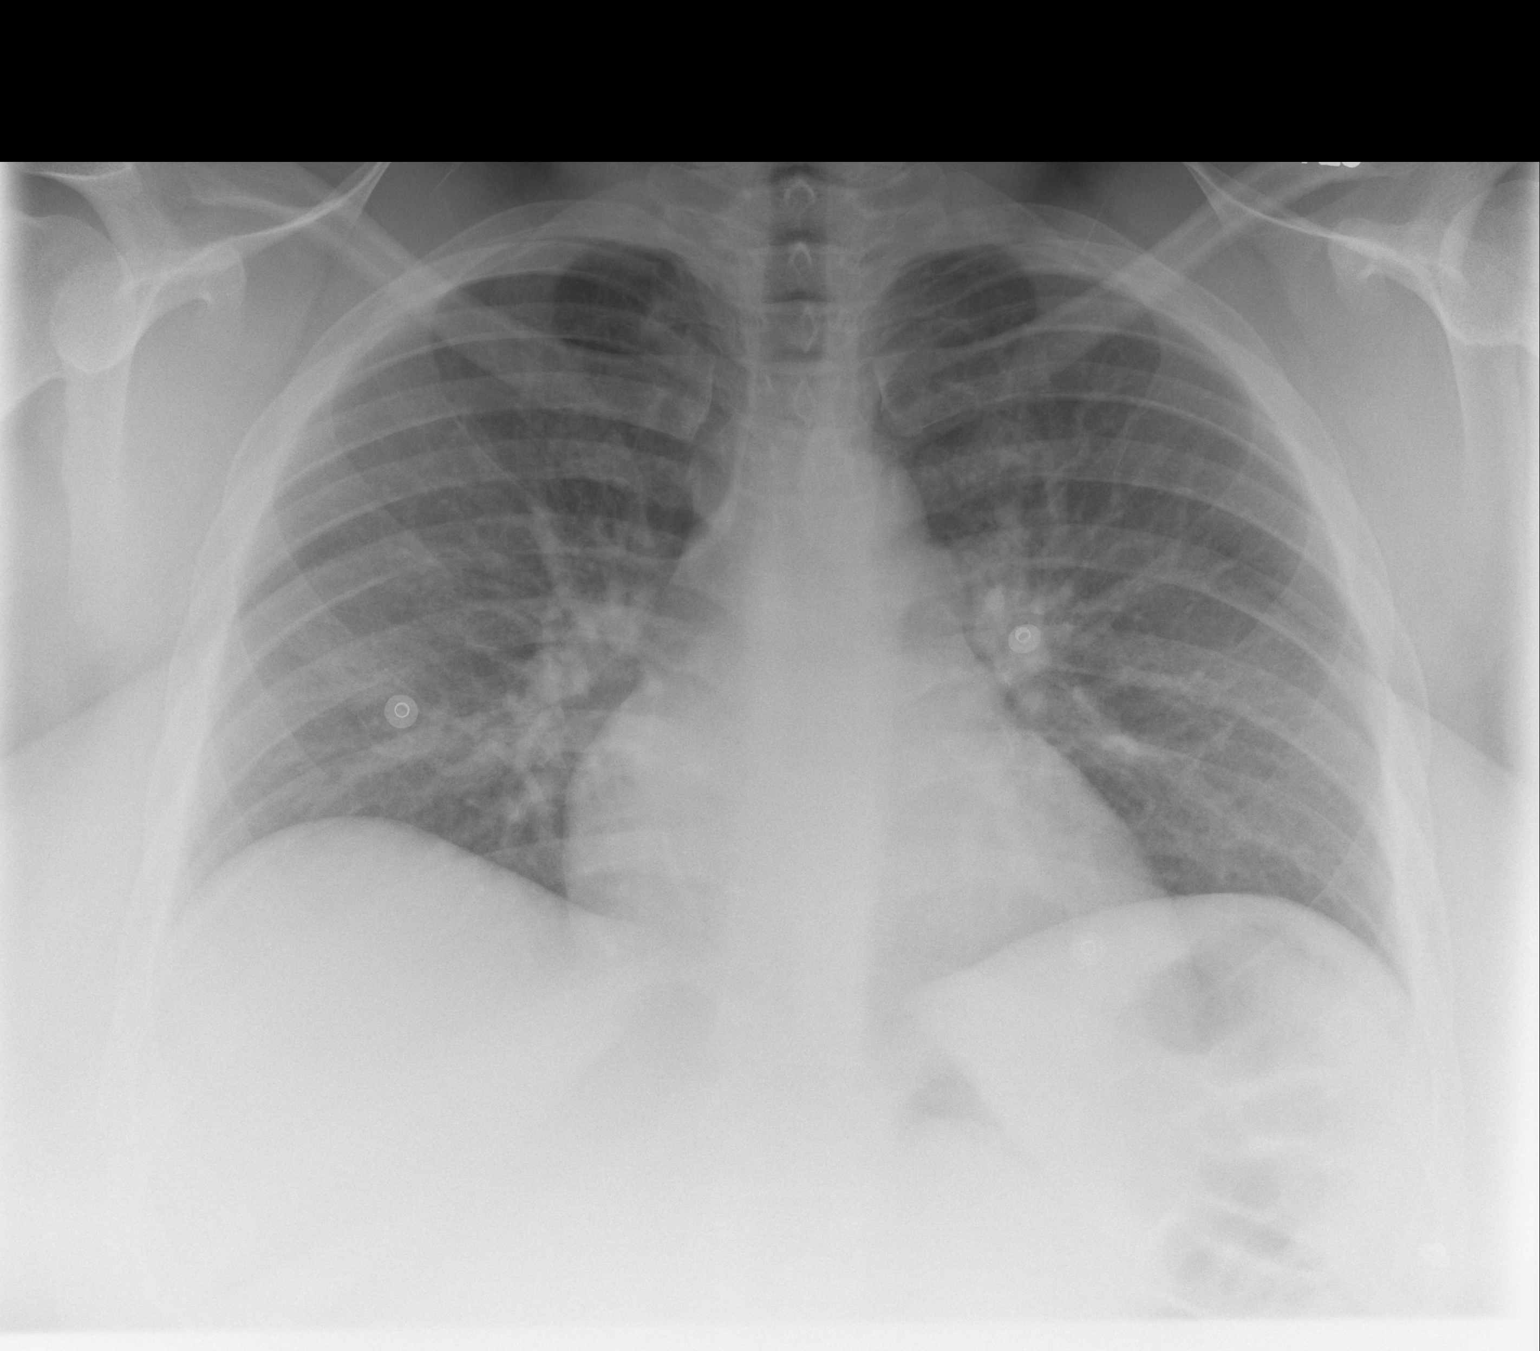

[w chest lat *]
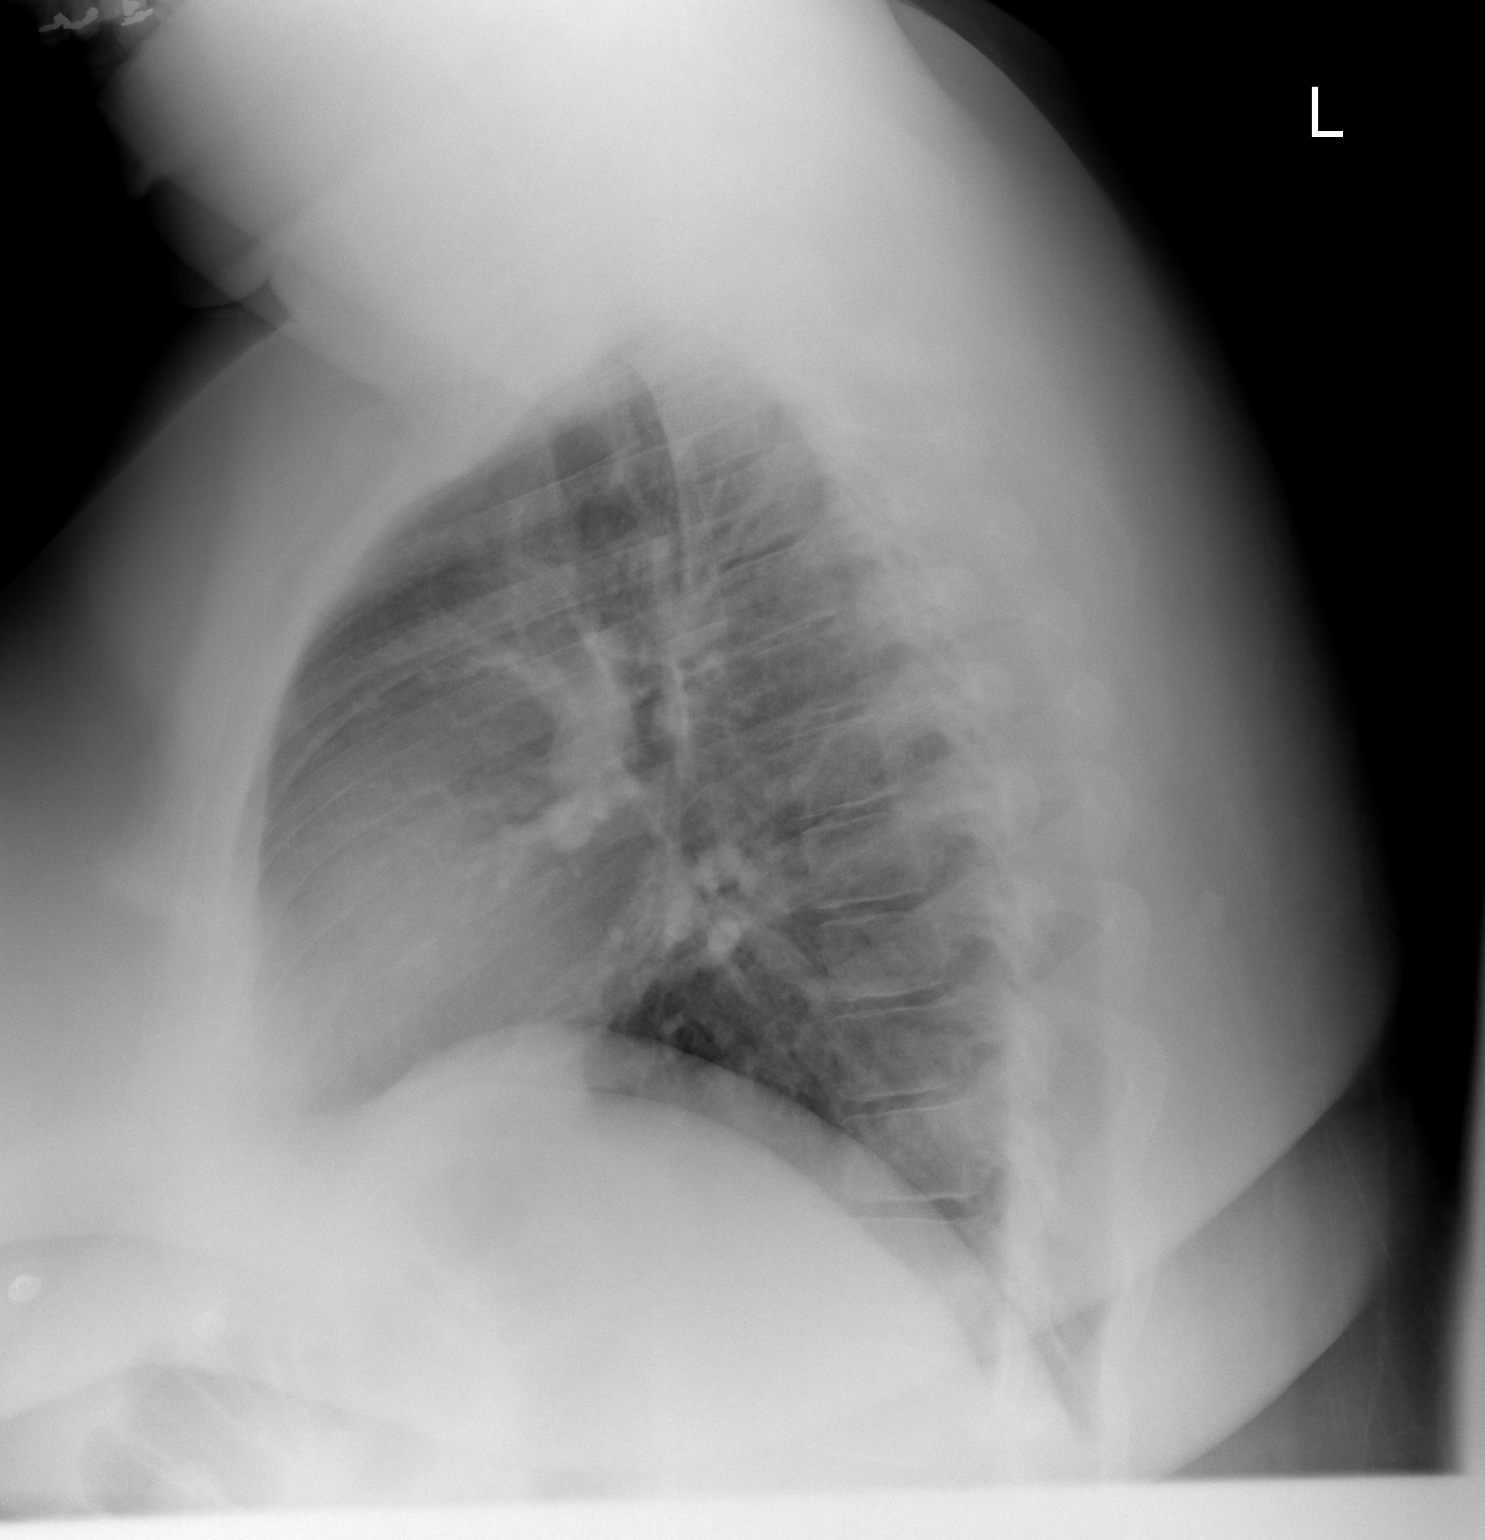

[2 of 2 positions shown; findings below may reference images not displayed]

FINDINGS: The heart size and mediastinal contours are within normal limits.
The lung volumes are low. Both lungs are clear. The visualized
skeletal structures are unremarkable.
IMPRESSION: Low lung volumes. No active cardiopulmonary disease.

## 2013-05-20 MED ORDER — MECLIZINE HCL 25 MG PO TABS
25.0000 mg | ORAL_TABLET | Freq: Once | ORAL | Status: AC
  Administered 2013-05-20: 25 mg via ORAL
  Filled 2013-05-20: qty 1

## 2013-05-20 MED ORDER — DIAZEPAM 5 MG PO TABS: 5.0000 mg | ORAL_TABLET | Freq: Two times a day (BID) | ORAL | Status: DC | PRN

## 2013-05-20 MED ORDER — DIAZEPAM 5 MG PO TABS
10.0000 mg | ORAL_TABLET | Freq: Once | ORAL | Status: AC
  Administered 2013-05-20: 10 mg via ORAL
  Filled 2013-05-20: qty 2

## 2013-05-20 MED ORDER — ONDANSETRON 4 MG PO TBDP: 4.0000 mg | ORAL_TABLET | Freq: Three times a day (TID) | ORAL | Status: DC | PRN

## 2013-05-20 MED ORDER — ONDANSETRON 4 MG PO TBDP
8.0000 mg | ORAL_TABLET | Freq: Once | ORAL | Status: AC
  Administered 2013-05-20: 8 mg via ORAL
  Filled 2013-05-20: qty 2

## 2013-05-20 MED ORDER — ACETAMINOPHEN 500 MG PO TABS
1000.0000 mg | ORAL_TABLET | Freq: Once | ORAL | Status: AC
  Administered 2013-05-20: 1000 mg via ORAL
  Filled 2013-05-20: qty 2

## 2013-05-20 MED ORDER — MECLIZINE HCL 50 MG PO TABS: 50.0000 mg | ORAL_TABLET | Freq: Three times a day (TID) | ORAL | Status: DC | PRN

## 2013-05-20 NOTE — ED Notes (Signed)
Pt comfortable with d/c and f/u instructions. Prescriptions x3 

## 2013-05-20 NOTE — ED Notes (Addendum)
Error in Charting: Triage assessment/screenings performed by Andreas Ohm RN under Curley Spice, NT.

## 2013-05-20 NOTE — ED Provider Notes (Signed)
I saw and evaluated the patient, reviewed the resident's note and I agree with the findings and plan.  EKG Interpretation     Ventricular Rate:  93 PR Interval:  117 QRS Duration: 88 QT Interval:  356 QTC Calculation: 443 R Axis:   64 Text Interpretation:  Sinus rhythm Borderline short PR interval            71F here with dizziness. Spinning sensation for dizziness. Cannot tolerate Neuro exam initially due to severe dizziness with eye movements. Patient's symptoms c/w vertigo. Having chest pain, sharp intermittent chest pain. Patient has had extensive w/u for chest pain for PEs, atypical today. No concern for ACS. Symptoms improved after meclizine. Stable for discharge.    Dagmar Hait, MD 05/21/13 0001

## 2013-05-20 NOTE — ED Notes (Signed)
Dr. Walden at bedside 

## 2013-05-20 NOTE — ED Notes (Signed)
PT presents from work via [employer] with c/o neck pain that is sharp and shooting to the left chest. Pt also c/o headache.

## 2013-05-20 NOTE — ED Provider Notes (Signed)
CSN: [id number]     Arrival date & time 05/20/13  1650 History   First MD Initiated Contact with Patient 05/20/13 1651     Chief Complaint  Patient presents with  . Chest Pain  . Headache   (Consider location/radiation/quality/duration/timing/severity/associated sxs/prior Treatment) HPI Onset was today about 3 hours ago, gradual, constant.  The pain is moderate located to left side of head, left neck, left chest, no radiation, described as sharp. Modifying factors: dizziness described as room spinning worse with head movement.  Associated symptoms: nausea but not vomiting.  Recent medical care: none.   Past Medical History  Diagnosis Date  . Bipolar disorder   . Tachycardia   . CIN I (cervical intraepithelial neoplasia I)   . OSA (obstructive sleep apnea)   . Anxiety   . Nephrolithiasis   . Enlarged heart    Past Surgical History  Procedure Laterality Date  . Bladder augmentation  2001    enlargement   Family History  Problem Relation Age of Onset  . Hypertension Mother   . Depression Mother   . Diabetes Mother   . Hypertension Father   . Diabetes Father   . Depression Father   . Hypertension Sister   . Diabetes Sister   . Depression Sister   . Depression Brother   . Diabetes Brother   . Hypertension Brother   . Cancer Maternal Aunt   . Cancer Paternal Aunt   . Hypertension Maternal Grandmother   . Diabetes Maternal Grandmother   . Depression Maternal Grandmother   . Hypertension Maternal Grandfather   . Diabetes Maternal Grandfather   . Depression Maternal Grandfather   . Hypertension Paternal Grandmother   . Diabetes Paternal Grandmother   . Depression Paternal Grandmother   . Hypertension Paternal Grandfather   . Diabetes Paternal Grandfather   . Depression Paternal Grandfather    History  Substance Use Topics  . Smoking status: Former Games developer  . Smokeless tobacco: Not on file  . Alcohol Use: No   OB History   Grav Para Term Preterm Abortions TAB SAB  Ect Mult Living                 Review of Systems Constitutional: Negative for fever.  Eyes: Negative for vision loss.  ENT: Negative for difficulty swallowing.  Cardiovascular: Negative for chest pain. Respiratory: Negative for respiratory distress.  Gastrointestinal:  Negative for vomiting.  Genitourinary: Negative for inability to void.  Musculoskeletal: Negative for gait problem.  Integumentary: Negative for rash.  Neurological: Negative for new focal weakness.     Allergies  Asa buff (mag and Nsaids  Home Medications   Current Outpatient Rx  Name  Route  Sig  Dispense  Refill  . diazepam (VALIUM) 5 MG tablet   Oral   Take 1 tablet (5 mg total) by mouth every 12 (twelve) hours as needed (dizziness).   10 tablet   0   . meclizine (ANTIVERT) 50 MG tablet   Oral   Take 1 tablet (50 mg total) by mouth 3 (three) times daily as needed for dizziness.   30 tablet   0   . ondansetron (ZOFRAN ODT) 4 MG disintegrating tablet   Oral   Take 1 tablet (4 mg total) by mouth every 8 (eight) hours as needed for nausea.   20 tablet   0    BP 141/82  Pulse 97  Temp(Src) 98.5 F (36.9 C) (Oral)  Resp 22  SpO2 100%  LMP 05/07/2013 Physical  Exam Nursing note and vitals reviewed.  Constitutional: Pt is alert and appears stated age. Obese. Eyes: No injection, no scleral icterus.  HENT: Atraumatic, airway open without erythema or exudate.  Respiratory: No respiratory distress. Equal breathing bilaterally. Cardiovascular: Normal rate. Extremities warm and well perfused.  Abdomen: Soft, non-tender. MSK: Extremities are atraumatic without deformity. Skin: No rash, no wounds.   Neuro: No motor nor sensory deficit. GCS 15. Normal CN, normal coordination.      ED Course  Procedures (including critical care time) Labs Review Labs Reviewed  POCT PREGNANCY, URINE   Imaging Review Dg Chest 2 View  05/20/2013   CLINICAL DATA:  Chest pain  EXAM: CHEST  2 VIEW  COMPARISON:   March 04, 2013  FINDINGS: The heart size and mediastinal contours are within normal limits. The lung volumes are low. Both lungs are clear. The visualized skeletal structures are unremarkable.  IMPRESSION: Low lung volumes. No active cardiopulmonary disease.   Electronically Signed   By: Sherian Rein M.D.   On: 05/20/2013 18:00    EKG Interpretation     Ventricular Rate:  93 PR Interval:  117 QRS Duration: 88 QT Interval:  356 QTC Calculation: 443 R Axis:   64 Text Interpretation:  Sinus rhythm Borderline short PR interval            MDM   1. Chest pain   2. Vertigo    27 y.o. female here with several symptoms with most prominent symptom being dizziness described as room spinning. Here pt with reproducible symptoms upon head movement. Headache and chest pain are atypical and nonspecific. No headache red flags, chest pain not c/w ACS, PE, or dissection. Pt with normal neuro exam. Not c/w stroke. EKG without acute ischemia. CXR without abnormality. Chest pain more c/w anxiety after the vertigo started. Pt feeling better after zofran, meclizine, valium, tylenol. Pt ready for d/c home with symptomatic management, pcp f/u. Counseling provided regarding diagnosis, treatment plan, follow up recommendations, and return precautions. Questions answer       I independently viewed, interpreted, and used in my medical decision making all ordered lab and imaging tests. Medical Decision Making discussed with ED attending Dagmar Hait, MD      Charm Barges, MD 05/20/13 1900

## 2013-06-04 ENCOUNTER — Emergency Department (HOSPITAL_COMMUNITY)
Admission: EM | Admit: 2013-06-04 | Discharge: 2013-06-04 | Disposition: A | Discharge status: Home / Self Care | Payer: 59 | Source: Home / Self Care

## 2013-06-04 NOTE — ED Notes (Signed)
Call pt at 3:55, not in lobby

## 2013-06-08 ENCOUNTER — Ambulatory Visit (INDEPENDENT_AMBULATORY_CARE_PROVIDER_SITE_OTHER): Payer: 59 | Admitting: Obstetrics and Gynecology

## 2013-06-08 ENCOUNTER — Encounter: Payer: Self-pay | Admitting: Obstetrics and Gynecology

## 2013-06-08 VITALS — BP 144/84 | Ht 67.0 in | Wt 398.8 lb

## 2013-06-08 DIAGNOSIS — IMO0002 Reserved for concepts with insufficient information to code with codable children: Secondary | ICD-10-CM

## 2013-06-08 DIAGNOSIS — N946 Dysmenorrhea, unspecified: Secondary | ICD-10-CM

## 2013-06-08 DIAGNOSIS — N920 Excessive and frequent menstruation with regular cycle: Secondary | ICD-10-CM

## 2013-06-08 NOTE — Patient Instructions (Signed)
U/s in am

## 2013-06-08 NOTE — Progress Notes (Signed)
   [facility] Clinic Visit  Patient name: Nancy [name] MRN [id number]  Date of birth: Oct 15, 1985  CC & HPI:  Nancy [name] is a 27 y.o. female presenting today for heavy periods. Patient is now in day 2 of her cycle she is out of work due to the discomfort associated with her menses and the heaviness of flow. She is a morbidly obese gravida 2 para 0 to has no plans for future childbearing. She wants a definitive solution to her heavy and painful periods She describes herself as having debilitating pain during her menstrual periods and some pain at other times. Will ask her to elaborate further and bring the report in summarizing her pain relating to her cycle and to daily activity Patient describes dyspareunia as a significant problem but describes female partner is unusually large anatomy  ROS:  2 spontaneous miscarriages, no history of ultrasound, bleeding too heavily for Pap today  Pertinent History Reviewed:  Medical & Surgical Hx:  Reviewed: Significant for  Medications: Reviewed & Updated - see associated section Social History: Reviewed -  reports that she has been smoking Cigarettes.  She has been smoking about 6.00 packs per day. She has never used smokeless tobacco.  Objective Findings:  Vitals: BP 144/84  Ht 5\' 7"  (1.702 m)  Wt 398 lb 12.8 oz (180.894 kg)  BMI 62.45 kg/m2  LMP 05/24/2013  Physical Examination: General appearance - alert, well appearing, and in no distress, oriented to person, place, and time, overweight, well hydrated and grossly overweight  the tissues appear overall healthy and tissue tones are good Mental status - alert, oriented to person, place, and time, normal mood, behavior, speech, dress, motor activity, and thought processes Abdomen - soft, nontender, nondistended, no masses or organomegaly Exam limited by extreme obesity with BMI 62.4 Pelvic - VULVA: normal appearing vulva with no masses, tenderness or lesions, VAGINA: normal appearing  vagina with normal color and discharge, no lesions, clots present in the vagina. Marked vaginal length requiring largest and longest speculum , would not be a candidate for vaginal procedures other than possible endometrial ablation  CERVIX: normal appearing cervix without discharge or lesions, generous amount of blood in the vagina around the cervix and on the perineum , exam limited by obesity, no palpable internal organs   Assessment & Plan:   Menorrhagia,  dysmenorrhea , dyspareunia (female partner size) Plan Will proceed promptly to ultrasound Given information regarding endometrial ablation Patient "wishes to consider "just having all taken out.' We'll need to discuss the morbidity of that given her body habitus. Could consider IUD Followup ultrasound one day

## 2013-06-09 ENCOUNTER — Ambulatory Visit (INDEPENDENT_AMBULATORY_CARE_PROVIDER_SITE_OTHER): Payer: 59 | Admitting: Obstetrics and Gynecology

## 2013-06-09 ENCOUNTER — Ambulatory Visit (INDEPENDENT_AMBULATORY_CARE_PROVIDER_SITE_OTHER): Payer: 59

## 2013-06-09 ENCOUNTER — Encounter: Payer: Self-pay | Admitting: Obstetrics and Gynecology

## 2013-06-09 ENCOUNTER — Other Ambulatory Visit: Payer: Self-pay | Admitting: Obstetrics and Gynecology

## 2013-06-09 VITALS — BP 140/86 | Ht 67.0 in | Wt 398.0 lb

## 2013-06-09 DIAGNOSIS — N920 Excessive and frequent menstruation with regular cycle: Secondary | ICD-10-CM

## 2013-06-09 DIAGNOSIS — N946 Dysmenorrhea, unspecified: Secondary | ICD-10-CM

## 2013-06-09 DIAGNOSIS — N949 Unspecified condition associated with female genital organs and menstrual cycle: Secondary | ICD-10-CM

## 2013-06-09 DIAGNOSIS — D649 Anemia, unspecified: Secondary | ICD-10-CM

## 2013-06-09 LAB — POCT HEMOGLOBIN: Hemoglobin: 11.7 g/dL — AB (ref 12.2–16.2)

## 2013-06-09 MED ORDER — HYDROCODONE-ACETAMINOPHEN 7.5-325 MG PO TABS: 1.0000 | ORAL_TABLET | Freq: Four times a day (QID) | ORAL | Status: DC | PRN

## 2013-06-09 MED ORDER — IBUPROFEN 800 MG PO TABS: 800.0000 mg | ORAL_TABLET | Freq: Three times a day (TID) | ORAL | Status: DC | PRN

## 2013-06-09 NOTE — Patient Instructions (Signed)
Endometrial biopsy 1 wk  Endometrial Biopsy Endometrial biopsy is a procedure in which a tissue sample is taken from inside the uterus. The tissue sample is then looked at under a microscope to see if the tissue is normal or abnormal. The endometrium is the lining of the uterus. This procedure helps determine where you are in your menstrual cycle and how hormone levels are affecting the lining of the uterus. This procedure may also be used to evaluate uterine bleeding or to diagnose endometrial cancer, tuberculosis, polyps, or inflammatory conditions.  LET [facility] CARE PROVIDER KNOW ABOUT:  Any allergies you have.  All medicines you are taking, including vitamins, herbs, eye drops, creams, and over-the-counter medicines.  Previous problems you or members of your family have had with the use of anesthetics.  Any blood disorders you have.  Previous surgeries you have had.  Medical conditions you have.  Possibility of pregnancy. RISKS AND COMPLICATIONS Generally, this is a safe procedure. However, as with any procedure, complications can occur. Possible complications include:  Bleeding.  Pelvic infection.  Puncture of the uterine wall with the biopsy device (rare). BEFORE THE PROCEDURE   Keep a record of your menstrual cycles as directed by your health care provider. You may need to schedule your procedure for a specific time in your cycle.  You may want to bring a sanitary pad to wear home after the procedure.  Arrange for someone to drive you home after the procedure if you will be given a medicine to help you relax (sedative). PROCEDURE   You may be given a sedative to relax you.  You will lie on an exam table with your feet and legs supported as in a pelvic exam.  Your health care provider will insert an instrument (speculum) into your vagina to see your cervix.  Your cervix will be cleansed with an antiseptic solution. A medicine (local anesthetic) will be used to  numb the cervix.  A forceps instrument (tenaculum) will be used to hold your cervix steady for the biopsy.  A thin, rodlike instrument (uterine sound) will be inserted through your cervix to determine the length of your uterus and the location where the biopsy sample will be removed.  A thin, flexible tube (catheter) will be inserted through your cervix and into the uterus. The catheter is used to collect the biopsy sample from your endometrial tissue.  The catheter and speculum will then be removed, and the tissue sample will be sent to a lab for examination. AFTER THE PROCEDURE  You will rest in a recovery area until you are ready to go home.  You may have mild cramping and a small amount of vaginal bleeding for a few days after the procedure. This is normal.  Make sure you find out how to get your test results. Document Released: 11/15/2004 Document Revised: 03/17/2013 Document Reviewed: 12/30/2012 [facility] Patient Information 2014 [place], [place].

## 2013-06-09 NOTE — Addendum Note (Signed)
Addended by: Tilda Burrow on: 06/09/2013 02:20 PM   Modules accepted: Orders

## 2013-06-09 NOTE — Progress Notes (Signed)
Patient ID: Nancy [name], female   DOB: 1986/02/13, 27 y.o.   MRN: [id number] Pt here today to go over results from US done today. U/s is relatively normal, without evidence of adenomyosis, And thin endometrium and no myomas.  Ovaries are normal. One small simple cyst, considered wnl. Pap in Feb 14 tried IUD in 2012, "worst decision EVER", removed in 3 wks. brevicon then just estrogen, then just progestin, then Brevicon again. Pt with self-described "very low pain tolerance" Options reviewed:   Recommended:     Endometrial biopsy next wk in proliferative phase. Exam prior to bx to assess baseline discomfort  Recommend pt consider endometrial ablation, and simultaneous laparoscopic salpingectomy.  Endometrial bx next wk. Rx motrin, HC.

## 2013-06-14 ENCOUNTER — Ambulatory Visit: Payer: 59 | Admitting: Obstetrics and Gynecology

## 2013-06-14 ENCOUNTER — Other Ambulatory Visit: Payer: Self-pay | Admitting: Obstetrics and Gynecology

## 2013-06-14 ENCOUNTER — Encounter: Payer: Self-pay | Admitting: Obstetrics and Gynecology

## 2013-06-14 ENCOUNTER — Telehealth: Payer: Self-pay | Admitting: Obstetrics and Gynecology

## 2013-06-14 ENCOUNTER — Ambulatory Visit (INDEPENDENT_AMBULATORY_CARE_PROVIDER_SITE_OTHER): Payer: 59 | Admitting: Obstetrics and Gynecology

## 2013-06-14 VITALS — BP 148/88 | Ht 67.0 in | Wt 398.0 lb

## 2013-06-14 DIAGNOSIS — Z3202 Encounter for pregnancy test, result negative: Secondary | ICD-10-CM

## 2013-06-14 DIAGNOSIS — N920 Excessive and frequent menstruation with regular cycle: Secondary | ICD-10-CM

## 2013-06-14 MED ORDER — HYDROCODONE-ACETAMINOPHEN 7.5-325 MG PO TABS: 1.0000 | ORAL_TABLET | Freq: Four times a day (QID) | ORAL | Status: DC | PRN

## 2013-06-14 NOTE — Telephone Encounter (Signed)
Spoke with Nancy [name]. Nancy [name] states she is in a lot of pain. Stopped bleeding Thursday. On Hydrocodone and Motrin for discomfort. Nancy [name] scheduled to have a biopsy Wednesday, but Nancy [name] states if she is still in this much pain, she will not be able to have a biopsy. +sharp pain in pelvic area. + backpain. Nancy [name] states she wants uterus out. Appt scheduled for Nancy [name] to see Dr. Emelda Fear today to discuss pain. Nancy [name] voiced understanding. JSY

## 2013-06-14 NOTE — Patient Instructions (Addendum)
Expect call by Friday. SIGN A RELEASE FROM THE HEALTH DEPARTMENT FOR PAP SMEAR AND LAST YEARS VISITS.

## 2013-06-15 NOTE — Progress Notes (Signed)
  Endometrial Biopsy: BP 148/88  Ht 5\' 7"  (1.702 m)  Wt 398 lb (180.532 kg)  BMI 62.32 kg/m2  LMP 06/07/2013  Patient given informed consent, signed copy in the chart, time out was performed. Time out taken. . The patient was placed in the lithotomy position and the cervix brought into view with sterile speculum.  Portio of cervix cleansed x 2 with betadine swabs.  A tenaculum was placed in the anterior lip of the cervix. The uterus was sounded for depth of 8 cm,. Milex uterine Explora 3 mm was introduced to into the uterus, suction created,  and an endometrial sample was obtained. All equipment was removed and accounted for.   The patient tolerated the procedure well.    Patient given post procedure instructions.  Followup: Pt to get pap results sent to office from Health dept.   Pt is strongly desirous of proceeding with surgical treatment, not interested in ablation and salpingectomy, wishes to be considered for hysterectomy.  I have concerns over her  Massive body habitus, BMI over 60, and access for [facility]. The uterus is normal sized, the uterus is anterior, and likely fairly accessible, but body habitus must be considered.  Will review options after endometrial biopsy.

## 2013-06-16 ENCOUNTER — Other Ambulatory Visit: Payer: 59 | Admitting: Obstetrics and Gynecology

## 2013-06-17 ENCOUNTER — Telehealth: Payer: Self-pay | Admitting: *Deleted

## 2013-06-17 DIAGNOSIS — Z029 Encounter for administrative examinations, unspecified: Secondary | ICD-10-CM

## 2013-06-17 NOTE — Telephone Encounter (Signed)
Pt informed Dr. Emelda Fear has not reviewed biopsy result. Pt to call office back tomorrow.

## 2013-06-18 ENCOUNTER — Telehealth: Payer: Self-pay | Admitting: Obstetrics and Gynecology

## 2013-06-18 NOTE — Telephone Encounter (Signed)
Dr. Emelda Fear spoke to pt about options

## 2013-06-21 ENCOUNTER — Telehealth: Payer: Self-pay | Admitting: Obstetrics and Gynecology

## 2013-06-21 ENCOUNTER — Ambulatory Visit: Payer: 59 | Admitting: Obstetrics and Gynecology

## 2013-06-28 ENCOUNTER — Telehealth: Payer: Self-pay | Admitting: Obstetrics and Gynecology

## 2013-06-28 NOTE — Telephone Encounter (Signed)
Pt waiting to hear back from Dr. Emelda Fear about scheduling surgery. Pt informed Dr. Emelda Fear out of office this am back this pm will discuss with Dr. Emelda Fear and call pt back.

## 2013-06-29 ENCOUNTER — Telehealth: Payer: Self-pay | Admitting: Obstetrics and Gynecology

## 2013-06-29 NOTE — Telephone Encounter (Signed)
See note of 06/28/13 discussion

## 2013-06-29 NOTE — Telephone Encounter (Signed)
Pt requesting surgery date and time and requesting pain meds. Explained to pt Dr. Emelda Fear out of office for the day, will speak with him tomorrow and call pt back. Pt verbalized understanding.

## 2013-06-29 NOTE — Telephone Encounter (Signed)
I spoke with patient yesterday , 12/1, and pt now declines my offer of trying to refer her to tertiary centre for laparoscopic supracervical hysterectomy. She asks that we proceed with the hysteroscopy and endometrial ablation and salpingectomy , which I am comfortable doing here at [facility]. I will need to look at schedule options

## 2013-06-30 ENCOUNTER — Telehealth: Payer: Self-pay | Admitting: Obstetrics and Gynecology

## 2013-06-30 NOTE — Telephone Encounter (Signed)
Pt informed per Dr. Emelda Fear will review his surgery schedule and contact pt with date and time. Pt verbalized understanding.

## 2013-07-02 ENCOUNTER — Other Ambulatory Visit: Payer: Self-pay | Admitting: Obstetrics and Gynecology

## 2013-07-02 ENCOUNTER — Encounter (HOSPITAL_COMMUNITY): Payer: Self-pay | Admitting: Pharmacy Technician

## 2013-07-02 NOTE — Telephone Encounter (Signed)
Surgery date schedule, Dawn to notify pt.

## 2013-07-02 NOTE — H&P (Signed)
Nancy [name] is an 27 y.o. female. She is a gravida 2, para 0020 ,no children, who presented in November for evaluation of her debilitating dysmenorrhea and heavy menstrual flow. BMI is 62 She has no desire for future children, and requests treatment for eliminating heavy flow and painful periods we have decided to perform endometrial ablation and at the same time proceed with bilateral salpingectomy for permanent sterilization, after lengthy discussion of pros and cons of various surgical and medical techniques The patient declines consideration of IUD having tried it once in the past "worst decision ever" only able to tolerate IUD for 2 months. She declines consideration of Implanon R. Nexplanon,. She would wish for consideration of hysterectomy but due to potential morbidity of surgery given her body weight we have agreed to proceed with endometrial ablation and salpingectomy as a less invasive alternative. Referral to tertiary surgery Center for hysterectomy been offered and declined by patient. Pertinent Gynecological History: Menses: flow is excessive with use of Both pads or tampons on heaviest days and regular every month without intermenstrual spotting Bleeding:  Contraception: condoms DES exposure: unknown Blood transfusions: none Sexually transmitted diseases: no past history Previous GYN Procedures:  Endometrial biopsy benign November 2014 Last mammogram:  Date:  Last pap: normal Date: 2013 thin Prep OB History: G2, P0020   Menstrual History: Menarche age:  Patient's last menstrual period was 06/07/2013.    Past Medical History  Diagnosis Date  . Bipolar disorder   . Tachycardia   . CIN I (cervical intraepithelial neoplasia I)   . OSA (obstructive sleep apnea)   . Anxiety   . Nephrolithiasis   . Enlarged heart     Past Surgical History  Procedure Laterality Date  . Bladder augmentation  2001    enlargement    Family History  Problem Relation Age of Onset  .  Hypertension Mother   . Depression Mother   . Diabetes Mother   . Cancer Mother     brain tumor  . Hypertension Father   . Diabetes Father   . Depression Father   . Hypertension Sister   . Diabetes Sister   . Depression Sister   . Depression Brother   . Diabetes Brother   . Hypertension Brother   . Cancer Maternal Aunt   . Cancer Paternal Aunt   . Hypertension Maternal Grandmother   . Diabetes Maternal Grandmother   . Depression Maternal Grandmother   . Hypertension Maternal Grandfather   . Diabetes Maternal Grandfather   . Depression Maternal Grandfather   . Hypertension Paternal Grandmother   . Diabetes Paternal Grandmother   . Depression Paternal Grandmother   . Hypertension Paternal Grandfather   . Diabetes Paternal Grandfather   . Depression Paternal Grandfather     Social History:  reports that she has been smoking Cigarettes.  She has been smoking about 6.00 packs per day. She has never used smokeless tobacco. She reports that she drinks alcohol. She reports that she does not use illicit drugs.  Allergies:  Allergies  Allergen Reactions  . Asa Buff (Mag [Buffered Aspirin] Shortness Of Breath  . Nsaids Shortness Of Breath     (Not in a hospital admission)  ROS  Last menstrual period 06/07/2013. Physical Exam  Constitutional: She is oriented to person, place, and time. She appears well-developed and well-nourished.  Morbidly obese African American female weight 398 pounds BMI 62  HENT:  Head: Normocephalic and atraumatic.  Eyes: Pupils are equal, round, and reactive to light.  Neck:  Normal range of motion. Neck supple.  Respiratory: Effort normal and breath sounds normal.  GI: Soft. Bowel sounds are normal.  No suspicion of umbilical hernia. Suprapubic trocar can likely be placed beneath the panniculus  Genitourinary: Vagina normal and uterus normal.  Generous vaginal length, may require a long speculum Cervix nonpurulent normal in appearance, uterus  anteflexed adnexa negative for masses  Musculoskeletal: Normal range of motion.  Neurological: She is alert and oriented to person, place, and time. She has normal reflexes.  Skin: Skin is warm and dry.  Psychiatric: She has a normal mood and affect. Her behavior is normal. Judgment and thought content normal.    No results found for this or any previous visit (from the past 24 hour(s)).  No results found.  Assessment/Plan: Assessment 1 dysmenorrhea, menorrhagia 2 morbid obesity 3 desire for permanent sterilization Plan: Laparoscopic bilateral salpingectomy for sterilization Hysteroscopy D&C endometrial ablation to be performed 07/06/2013 Risks of procedure rationale alternatives discussed at length with the patient during the decision making process  Nancy [name] V 07/02/2013, 9:05 AM

## 2013-07-04 NOTE — Patient Instructions (Signed)
Waylynn Benefiel  07/04/2013   Your procedure is scheduled on:  Tuesday, 07/06/13  Report to [facility] at 1040 AM.  Call this number if you have problems the morning of surgery: [phone]   Remember:   Do not eat food or drink liquids after midnight.   Take these medicines the morning of surgery with A SIP OF WATER: norco if needed   Do not wear jewelry, make-up or nail polish.  Do not wear lotions, powders, or perfumes. You may wear deodorant.  Do not shave 48 hours prior to surgery. Men may shave face and neck.  Do not bring valuables to the hospital.  [facility] is not responsible                  for any belongings or valuables.               Contacts, dentures or bridgework may not be worn into surgery.  Leave suitcase in the car. After surgery it may be brought to your room.  For patients admitted to the hospital, discharge time is determined by your                treatment team.               Patients discharged the day of surgery will not be allowed to drive  home.  Name and phone number of your driver: family  Special Instructions: Shower using CHG 2 nights before surgery and the night before surgery.  If you shower the day of surgery use CHG.  Use special wash - you have one bottle of CHG for all showers.  You should use approximately 1/3 of the bottle for each shower.   Please read over the following fact sheets that you were given: Coughing and Deep Breathing, Surgical Site Infection Prevention, Anesthesia Post-op Instructions and Care and Recovery After Surgery   Hysteroscopy Hysteroscopy is a procedure used for looking inside the womb (uterus). It may be done for many different reasons, including:  To evaluate abnormal bleeding, fibroid (benign, noncancerous) tumors, polyps, scar tissue (adhesions), and possibly cancer of the uterus.  To look for lumps (tumors) and other uterine growths.  To look for causes of why a woman cannot get pregnant (infertility), causes  of recurrent loss of pregnancy (miscarriages), or a lost intrauterine device (IUD).  To perform a sterilization by blocking the fallopian tubes from inside the uterus. A hysteroscopy should be done right after a menstrual period to be sure you are not pregnant. LET YOUR CAREGIVER KNOW ABOUT:   Allergies.  Medicines taken, including herbs, eyedrops, over-the-counter medicines, and creams.  Use of steroids (by mouth or creams).  Previous problems with anesthetics or numbing medicines.  History of bleeding or blood problems.  History of blood clots.  Possibility of pregnancy, if this applies.  Previous surgery.  Other health problems. RISKS AND COMPLICATIONS   Putting a hole in the uterus.  Excessive bleeding.  Infection.  Damage to the cervix.  Injury to other organs.  Allergic reaction to medicines.  Too much fluid used in the uterus for the procedure. BEFORE THE PROCEDURE   Do not take aspirin or blood thinners for a week before the procedure, or as directed. It can cause bleeding.  Arrive at least 60 minutes before the procedure or as directed to read and sign the necessary forms.  Arrange for someone to take you home after the procedure.  If you smoke, do not smoke  for 2 weeks before the procedure. PROCEDURE   Your caregiver may give you medicine to relax you. He or she may also give you a medicine that numbs the area around the cervix (local anesthetic) or a medicine that makes you sleep (general anesthesia).  Sometimes, a medicine is placed in the cervix the day before the procedure. This medicine makes the cervix have a larger opening (dilate). This makes it easier for the instrument to be inserted into the uterus.  A small instrument (hysteroscope) is inserted through the vagina into the uterus. This instrument is similar to a pencil-sized telescope with a light.  During the procedure, air or a liquid is put into the uterus, which allows the surgeon to see  better.  Sometimes, tissue is gently scraped from inside the uterus. These tissue samples are sent to a specialist who looks at tissue samples (pathologist). The pathologist will give a report to your caregiver. This will help your caregiver decide if further treatment is necessary. The report will also help your caregiver decide on the best treatment if the test comes back abnormal. AFTER THE PROCEDURE   If you had a general anesthetic, you may be groggy for a couple hours after the procedure.  If you had a local anesthetic, you will be advised to rest at the surgical center or caregiver's office until you are stable and feel ready to go home.  You may have some cramping for a couple days.  You may have bleeding, which varies from light spotting for a few days to menstrual-like bleeding for up to 3 to 7 days. This is normal.  Have someone take you home. FINDING OUT THE RESULTS OF YOUR TEST Not all test results are available during your visit. If your test results are not back during the visit, make an appointment with your caregiver to find out the results. Do not assume everything is normal if you have not heard from your caregiver or the medical facility. It is important for you to follow up on all of your test results. HOME CARE INSTRUCTIONS   Do not drive for 24 hours or as instructed.  Only take over-the-counter or prescription medicines for pain, discomfort, or fever as directed by your caregiver.  Do not take aspirin. It can cause or aggravate bleeding.  Do not drive or drink alcohol while taking pain medicine.  You may resume your usual diet.  Do not use tampons, douche, or have sexual intercourse for 2 weeks, or as advised by your caregiver.  Rest and sleep for the first 24 to 48 hours.  Take your temperature twice a day for 4 to 5 days. Write it down. Give these temperatures to your caregiver if they are abnormal (above 98.6 F or 37.0 C).  Take medicines your caregiver  has ordered as directed.  Follow your caregiver's advice regarding diet, exercise, lifting, driving, and general activities.  Take showers instead of baths for 2 weeks, or as recommended by your caregiver.  If you develop constipation:  Take a mild laxative with the advice of your caregiver.  Eat bran foods.  Drink enough water and fluids to keep your urine clear or pale yellow.  Try to have someone with you or available to you for the first 24 to 48 hours, especially if you had a general anesthetic.  Make sure you and your family understand everything about your operation and recovery.  Follow your caregiver's advice regarding follow-up appointments and Pap smears. SEEK MEDICAL CARE IF:  You feel dizzy or lightheaded.  You feel sick to your stomach (nauseous).  You develop abnormal vaginal discharge.  You develop a rash.  You have an abnormal reaction or allergy to your medicine.  You need stronger pain medicine. SEEK IMMEDIATE MEDICAL CARE IF:   Bleeding is heavier than a normal menstrual period or you have blood clots.  You have an oral temperature above 102 F (38.9 C), not controlled by medicine.  You have increasing cramps or pains not relieved with medicine.  You develop belly (abdominal) pain that does not seem to be related to the same area of earlier cramping and pain.  You pass out.  You develop pain in the tops of your shoulders (shoulder strap areas).  You develop shortness of breath. MAKE SURE YOU:   Understand these instructions.  Will watch your condition.  Will get help right away if you are not doing well or get worse. Document Released: 10/21/2000 Document Revised: 10/07/2011 Document Reviewed: 02/11/2013 [facility] Patient Information 2014 [place], [place]. Salpingectomy, Care After Refer to this sheet in the next few weeks. These instructions provide you with information on caring for yourself after your procedure. Your caregiver may also  give you specific instructions. Your treatment has been planned according to current medical practices, but problems sometimes occur. Call your caregiver if you have any problems or questions after your procedure. HOME CARE INSTRUCTIONS  Follow your caregiver's advice about taking your medications and follow-up appointments.  Resume your regular diet.  Get plenty of rest and sleep.  Do not exercise, drive, or lift over 5 pounds (2.3 kg) until you get permission from your caregiver.  Do not have sexual relations until you have permission from your caregiver.  Try to have someone at home with you for a week to help with the household chores.  Take your temperature twice a day for the first week. Write those temperatures down. SEEK MEDICAL CARE IF:  You have swelling or redness around the surgical cut (incision).  You develop a rash.  You have side effects from your medications.  You feel lightheaded.  You need more pain medication. SEEK IMMEDIATE MEDICAL CARE IF  You develop a temperature of 102 F (38.9 C) or higher.  You develop abdominal pain.  You have pus coming out of the incision or the incision is separating.  You have pain when you urinate.  You develop chest or leg pain.  You develop shortness of breath.  You pass out. Document Released: 10/19/2010 Document Revised: 10/07/2011 Document Reviewed: 01/06/2013 [facility] Patient Information 2014 [place], [place]. Endometrial Ablation Endometrial ablation removes the lining of the uterus (endometrium). It is usually a same-day, outpatient treatment. Ablation helps avoid major surgery, such as surgery to remove the cervix and uterus (hysterectomy). After endometrial ablation, you will have little or no menstrual bleeding and may not be able to have children. However, if you are premenopausal, you will need to use a reliable method of birth control following the procedure because of the small chance that pregnancy can  occur. There are different reasons to have this procedure, which include:  Heavy periods.  Bleeding that is causing anemia.  Irregular bleeding.  Bleeding fibroids on the lining inside the uterus if they are smaller than 3 centimeters. This procedure should not be done if:  You want children in the future.  You have severe cramps with your menstrual period.  You have precancerous or cancerous cells in your uterus.  You were recently pregnant.  You  have gone through menopause.  You have had major surgery on the uterus, such as a cesarean delivery. LET [facility] CARE PROVIDER KNOW ABOUT:  Any allergies you have.  All medicines you are taking, including vitamins, herbs, eye drops, creams, and over-the-counter medicines.  Previous problems you or members of your family have had with the use of anesthetics.  Any blood disorders you have.  Previous surgeries you have had.  Medical conditions you have. RISKS AND COMPLICATIONS  Generally, this is a safe procedure. However, as with any procedure, complications can occur. Possible complications include:  Perforation of the uterus.  Bleeding.  Infection of the uterus, bladder, or vagina.  Injury to surrounding organs.  An air bubble to the lung (air embolus).  Pregnancy following the procedure.  Failure of the procedure to help the problem, requiring hysterectomy.  Decreased ability to diagnose cancer in the lining of the uterus. BEFORE THE PROCEDURE  The lining of the uterus must be tested to make sure there is no pre-cancerous or cancer cells present.  An ultrasound may be performed to look at the size of the uterus and to check for abnormalities.  Medicines may be given to thin the lining of the uterus. PROCEDURE  During the procedure, your health care provider will use a tool called a resectoscope to help see inside your uterus. There are different ways to remove the lining of your uterus.   Radiofrequency   This method uses a radiofrequency-alternating electric current to remove the lining of the uterus.  Cryotherapy This method uses extreme cold to freeze the lining of the uterus.  Heated-Free Liquid  This method uses heated salt (saline) solution to remove the lining of the uterus.  Microwave This method uses high-energy microwaves to heat up the lining of the uterus to remove it.  Thermal balloon  This method involves inserting a catheter with a balloon tip into the uterus. The balloon tip is filled with heated fluid to remove the lining of the uterus. AFTER THE PROCEDURE  After your procedure, do not have sexual intercourse or insert anything into your vagina until permitted by your health care provider. After the procedure, you may experience:  Cramps.  Vaginal discharge.  Frequent urination. Document Released: 05/24/2004 Document Revised: 03/17/2013 Document Reviewed: 12/16/2012 [facility] Patient Information 2014 [place], [place]. PATIENT INSTRUCTIONS POST-ANESTHESIA  IMMEDIATELY FOLLOWING SURGERY:  Do not drive or operate machinery for the first twenty four hours after surgery.  Do not make any important decisions for twenty four hours after surgery or while taking narcotic pain medications or sedatives.  If you develop intractable nausea and vomiting or a severe headache please notify your doctor immediately.  FOLLOW-UP:  Please make an appointment with your surgeon as instructed. You do not need to follow up with anesthesia unless specifically instructed to do so.  WOUND CARE INSTRUCTIONS (if applicable):  Keep a dry clean dressing on the anesthesia/puncture wound site if there is drainage.  Once the wound has quit draining you may leave it open to air.  Generally you should leave the bandage intact for twenty four hours unless there is drainage.  If the epidural site drains for more than 36-48 hours please call the anesthesia department.  QUESTIONS?:  Please feel free to call your  physician or the hospital operator if you have any questions, and they will be happy to assist you.

## 2013-07-05 ENCOUNTER — Encounter (HOSPITAL_COMMUNITY)
Admission: RE | Admit: 2013-07-05 | Discharge: 2013-07-05 | Disposition: A | Discharge status: Home / Self Care | Payer: 59 | Source: Ambulatory Visit | Attending: Obstetrics and Gynecology | Admitting: Obstetrics and Gynecology

## 2013-07-05 ENCOUNTER — Encounter (HOSPITAL_COMMUNITY): Payer: Self-pay

## 2013-07-05 LAB — HCG, SERUM, QUALITATIVE: Preg, Serum: NEGATIVE

## 2013-07-05 LAB — CBC
HCT: 39 % (ref 36.0–46.0)
Hemoglobin: 12.9 g/dL (ref 12.0–15.0)
MCHC: 33.1 g/dL (ref 30.0–36.0)
MCV: 84.2 fL (ref 78.0–100.0)
WBC: 6.5 10*3/uL (ref 4.0–10.5)

## 2013-07-05 LAB — COMPREHENSIVE METABOLIC PANEL
AST: 15 U/L (ref 0–37)
Albumin: 3.6 g/dL (ref 3.5–5.2)
Alkaline Phosphatase: 76 U/L (ref 39–117)
BUN: 7 mg/dL (ref 6–23)
Chloride: 101 mEq/L (ref 96–112)
GFR calc Af Amer: >90 mL/min (ref >90)
GFR calc non Af Amer: >90 mL/min (ref >90)
Glucose, Bld: 118 mg/dL — ABNORMAL HIGH (ref 70–99)
Potassium: 4.1 mEq/L (ref 3.5–5.1)
Total Bilirubin: 0.6 mg/dL (ref 0.3–1.2)
Total Protein: 7.8 g/dL (ref 6.0–8.3)

## 2013-07-06 ENCOUNTER — Encounter (HOSPITAL_COMMUNITY): Payer: Self-pay | Admitting: Anesthesiology

## 2013-07-06 ENCOUNTER — Encounter (HOSPITAL_COMMUNITY): Payer: 59 | Admitting: Anesthesiology

## 2013-07-06 ENCOUNTER — Encounter (HOSPITAL_COMMUNITY)
Admission: RE | Disposition: A | Discharge status: Home / Self Care | Payer: Self-pay | Source: Ambulatory Visit | Attending: Obstetrics and Gynecology

## 2013-07-06 ENCOUNTER — Ambulatory Visit (HOSPITAL_COMMUNITY): Payer: 59 | Admitting: Anesthesiology

## 2013-07-06 ENCOUNTER — Ambulatory Visit (HOSPITAL_COMMUNITY)
Admission: RE | Admit: 2013-07-06 | Discharge: 2013-07-06 | Disposition: A | Discharge status: Home / Self Care | Payer: 59 | Source: Ambulatory Visit | Attending: Obstetrics and Gynecology | Admitting: Obstetrics and Gynecology

## 2013-07-06 DIAGNOSIS — Z302 Encounter for sterilization: Secondary | ICD-10-CM

## 2013-07-06 DIAGNOSIS — N946 Dysmenorrhea, unspecified: Secondary | ICD-10-CM | POA: Insufficient documentation

## 2013-07-06 DIAGNOSIS — N920 Excessive and frequent menstruation with regular cycle: Secondary | ICD-10-CM

## 2013-07-06 HISTORY — PX: DILITATION & CURRETTAGE/HYSTROSCOPY WITH THERMACHOICE ABLATION: SHX5569

## 2013-07-06 HISTORY — PX: LAPAROSCOPIC BILATERAL SALPINGECTOMY: SHX5889

## 2013-07-06 SURGERY — DILATATION & CURETTAGE/HYSTEROSCOPY WITH THERMACHOICE ABLATION
Anesthesia: General | Site: Uterus

## 2013-07-06 MED ORDER — DEXTROSE 5 % IV SOLN
INTRAVENOUS | Status: DC | PRN
  Administered 2013-07-06: 500 mL

## 2013-07-06 MED ORDER — LIDOCAINE HCL (PF) 1 % IJ SOLN
INTRAMUSCULAR | Status: AC
  Filled 2013-07-06: qty 5

## 2013-07-06 MED ORDER — FENTANYL CITRATE 0.05 MG/ML IJ SOLN
25.0000 ug | INTRAMUSCULAR | Status: DC | PRN
  Administered 2013-07-06 (×4): 25 ug via INTRAVENOUS

## 2013-07-06 MED ORDER — SUCCINYLCHOLINE CHLORIDE 20 MG/ML IJ SOLN
INTRAMUSCULAR | Status: DC | PRN
  Administered 2013-07-06: 140 mg via INTRAVENOUS
  Administered 2013-07-06: 40 mg via INTRAVENOUS
  Administered 2013-07-06: 20 mg via INTRAVENOUS

## 2013-07-06 MED ORDER — ROCURONIUM BROMIDE 50 MG/5ML IV SOLN
INTRAVENOUS | Status: AC
  Filled 2013-07-06: qty 1

## 2013-07-06 MED ORDER — LIDOCAINE HCL 1 % IJ SOLN
INTRAMUSCULAR | Status: DC | PRN
  Administered 2013-07-06: 40 mg via INTRADERMAL

## 2013-07-06 MED ORDER — DEXAMETHASONE SODIUM PHOSPHATE 10 MG/ML IJ SOLN
INTRAMUSCULAR | Status: DC | PRN
  Administered 2013-07-06: 10 mg via INTRAVENOUS

## 2013-07-06 MED ORDER — PROPOFOL 10 MG/ML IV BOLUS
INTRAVENOUS | Status: AC
  Filled 2013-07-06: qty 20

## 2013-07-06 MED ORDER — SUCCINYLCHOLINE CHLORIDE 20 MG/ML IJ SOLN
INTRAMUSCULAR | Status: AC
  Filled 2013-07-06: qty 1

## 2013-07-06 MED ORDER — ONDANSETRON HCL 4 MG/2ML IJ SOLN
4.0000 mg | Freq: Once | INTRAMUSCULAR | Status: AC | PRN
  Administered 2013-07-06: 4 mg via INTRAVENOUS

## 2013-07-06 MED ORDER — DEXAMETHASONE SODIUM PHOSPHATE 4 MG/ML IJ SOLN
INTRAMUSCULAR | Status: AC
  Filled 2013-07-06: qty 1

## 2013-07-06 MED ORDER — PROPOFOL 10 MG/ML IV BOLUS
INTRAVENOUS | Status: DC | PRN
  Administered 2013-07-06 (×2): 50 mg via INTRAVENOUS
  Administered 2013-07-06: 200 mg via INTRAVENOUS
  Administered 2013-07-06: 50 mg via INTRAVENOUS

## 2013-07-06 MED ORDER — NEOSTIGMINE METHYLSULFATE 1 MG/ML IJ SOLN
INTRAMUSCULAR | Status: DC | PRN
  Administered 2013-07-06: 2.5 mg via INTRAVENOUS

## 2013-07-06 MED ORDER — ROCURONIUM BROMIDE 100 MG/10ML IV SOLN
INTRAVENOUS | Status: DC | PRN
  Administered 2013-07-06: 22 mg via INTRAVENOUS
  Administered 2013-07-06: 8 mg via INTRAVENOUS
  Administered 2013-07-06 (×2): 10 mg via INTRAVENOUS

## 2013-07-06 MED ORDER — FENTANYL CITRATE 0.05 MG/ML IJ SOLN
INTRAMUSCULAR | Status: AC
  Filled 2013-07-06: qty 2

## 2013-07-06 MED ORDER — PROPOFOL 10 MG/ML IV BOLUS
INTRAVENOUS | Status: AC
Start: 2013-07-06 — End: 2013-07-06
  Filled 2013-07-06: qty 20

## 2013-07-06 MED ORDER — ONDANSETRON HCL 4 MG/2ML IJ SOLN
INTRAMUSCULAR | Status: AC
  Filled 2013-07-06: qty 2

## 2013-07-06 MED ORDER — BUPIVACAINE-EPINEPHRINE PF 0.5-1:200000 % IJ SOLN
INTRAMUSCULAR | Status: AC
  Filled 2013-07-06: qty 10

## 2013-07-06 MED ORDER — SODIUM CHLORIDE 0.9 % IR SOLN
Status: DC | PRN
  Administered 2013-07-06: 3000 mL

## 2013-07-06 MED ORDER — HYDROCODONE-ACETAMINOPHEN 7.5-325 MG PO TABS: 1.0000 | ORAL_TABLET | Freq: Four times a day (QID) | ORAL | Status: DC | PRN

## 2013-07-06 MED ORDER — LACTATED RINGERS IV SOLN
INTRAVENOUS | Status: DC | PRN
  Administered 2013-07-06 (×2): via INTRAVENOUS

## 2013-07-06 MED ORDER — LACTATED RINGERS IV SOLN
INTRAVENOUS | Status: DC
  Administered 2013-07-06: 1000 mL via INTRAVENOUS

## 2013-07-06 MED ORDER — ONDANSETRON HCL 4 MG/2ML IJ SOLN
4.0000 mg | Freq: Once | INTRAMUSCULAR | Status: AC
  Administered 2013-07-06: 4 mg via INTRAVENOUS

## 2013-07-06 MED ORDER — METOPROLOL TARTRATE 1 MG/ML IV SOLN
INTRAVENOUS | Status: DC | PRN
  Administered 2013-07-06 (×2): 2 mg via INTRAVENOUS

## 2013-07-06 MED ORDER — FENTANYL CITRATE 0.05 MG/ML IJ SOLN
INTRAMUSCULAR | Status: DC | PRN
  Administered 2013-07-06 (×5): 50 ug via INTRAVENOUS

## 2013-07-06 MED ORDER — FENTANYL CITRATE 0.05 MG/ML IJ SOLN: 25.0000 ug | INTRAMUSCULAR | Status: DC

## 2013-07-06 MED ORDER — GLYCOPYRROLATE 0.2 MG/ML IJ SOLN
INTRAMUSCULAR | Status: DC | PRN
  Administered 2013-07-06: .3 mg via INTRAVENOUS

## 2013-07-06 MED ORDER — BUPIVACAINE-EPINEPHRINE 0.5% -1:200000 IJ SOLN
INTRAMUSCULAR | Status: DC | PRN
  Administered 2013-07-06: 20 mL

## 2013-07-06 MED ORDER — 0.9 % SODIUM CHLORIDE (POUR BTL) OPTIME
TOPICAL | Status: DC | PRN
  Administered 2013-07-06: 1000 mL

## 2013-07-06 MED ORDER — FENTANYL CITRATE 0.05 MG/ML IJ SOLN
INTRAMUSCULAR | Status: AC
  Filled 2013-07-06: qty 5

## 2013-07-06 MED ORDER — MIDAZOLAM HCL 2 MG/2ML IJ SOLN
INTRAMUSCULAR | Status: AC
  Filled 2013-07-06: qty 2

## 2013-07-06 MED ORDER — MIDAZOLAM HCL 2 MG/2ML IJ SOLN
1.0000 mg | INTRAMUSCULAR | Status: DC | PRN
  Administered 2013-07-06: 2 mg via INTRAVENOUS

## 2013-07-06 SURGICAL SUPPLY — 60 items
BAG DECANTER FOR FLEXI CONT (MISCELLANEOUS) ×3 IMPLANT
BAG HAMPER (MISCELLANEOUS) ×3 IMPLANT
BAG SPEC RTRVL LRG 6X4 10 (ENDOMECHANICALS) ×2
BLADE SURG SZ11 CARB STEEL (BLADE) ×3 IMPLANT
CATH ROBINSON RED A/P 16FR (CATHETERS) ×3 IMPLANT
CATH THERMACHOICE III (CATHETERS) ×3 IMPLANT
CLOTH BEACON ORANGE TIMEOUT ST (SAFETY) ×3 IMPLANT
COVER LIGHT HANDLE STERIS (MISCELLANEOUS) ×6 IMPLANT
COVER MAYO STAND XLG (DRAPE) ×1 IMPLANT
DECANTER SPIKE VIAL GLASS SM (MISCELLANEOUS) ×3 IMPLANT
DRAPE PROXIMA HALF (DRAPES) ×1 IMPLANT
DRESSING COVERLET 3X1 FLEXIBLE (GAUZE/BANDAGES/DRESSINGS) ×9 IMPLANT
DURAPREP 26ML APPLICATOR (WOUND CARE) ×3 IMPLANT
ELECT REM PT RETURN 9FT ADLT (ELECTROSURGICAL) ×3
ELECTRODE REM PT RTRN 9FT ADLT (ELECTROSURGICAL) ×2 IMPLANT
FILTER SMOKE EVAC LAPAROSHD (FILTER) ×3 IMPLANT
FORMALIN 10 PREFIL 120ML (MISCELLANEOUS) ×4 IMPLANT
GLOVE BIOGEL PI IND STRL 7.0 (GLOVE) IMPLANT
GLOVE BIOGEL PI IND STRL 7.5 (GLOVE) IMPLANT
GLOVE BIOGEL PI IND STRL 9 (GLOVE) ×4 IMPLANT
GLOVE BIOGEL PI INDICATOR 7.0 (GLOVE) ×2
GLOVE BIOGEL PI INDICATOR 7.5 (GLOVE) ×1
GLOVE BIOGEL PI INDICATOR 9 (GLOVE) ×2
GLOVE ECLIPSE 7.0 STRL STRAW (GLOVE) ×1 IMPLANT
GLOVE ECLIPSE 9.0 STRL (GLOVE) ×4 IMPLANT
GLOVE EXAM NITRILE MD LF STRL (GLOVE) ×2 IMPLANT
GLOVE SS BIOGEL STRL SZ 6.5 (GLOVE) IMPLANT
GLOVE SUPERSENSE BIOGEL SZ 6.5 (GLOVE) ×1
GOWN STRL REIN 3XL LVL4 (GOWN DISPOSABLE) ×3 IMPLANT
GOWN STRL REIN XL XLG (GOWN DISPOSABLE) ×7 IMPLANT
INST SET HYSTEROSCOPY (KITS) ×3 IMPLANT
INST SET LAPROSCOPIC GYN AP (KITS) ×3 IMPLANT
IV D5W 500ML (IV SOLUTION) ×3 IMPLANT
IV NS IRRIG 3000ML ARTHROMATIC (IV SOLUTION) ×3 IMPLANT
KIT ROOM TURNOVER AP CYSTO (KITS) ×3 IMPLANT
KIT ROOM TURNOVER APOR (KITS) ×3 IMPLANT
LIGASURE 5MM LAPAROSCOPIC (INSTRUMENTS) ×1 IMPLANT
MANIFOLD NEPTUNE II (INSTRUMENTS) ×3 IMPLANT
NDL HYPO 25X1 1.5 SAFETY (NEEDLE) ×2 IMPLANT
NEEDLE HYPO 25X1 1.5 SAFETY (NEEDLE) ×3 IMPLANT
NEEDLE INSUFFLATION 120MM (ENDOMECHANICALS) ×3 IMPLANT
NS IRRIG 1000ML POUR BTL (IV SOLUTION) ×3 IMPLANT
PACK PERI GYN (CUSTOM PROCEDURE TRAY) ×3 IMPLANT
PAD ARMBOARD 7.5X6 YLW CONV (MISCELLANEOUS) ×3 IMPLANT
PAD TELFA 3X4 1S STER (GAUZE/BANDAGES/DRESSINGS) ×3 IMPLANT
POUCH SPECIMEN RETRIEVAL 10MM (ENDOMECHANICALS) ×1 IMPLANT
SET BASIN LINEN APH (SET/KITS/TRAYS/PACK) ×3 IMPLANT
SET IRRIG Y TYPE TUR BLADDER L (SET/KITS/TRAYS/PACK) ×3 IMPLANT
SOLUTION ANTI FOG 6CC (MISCELLANEOUS) ×3 IMPLANT
STRIP CLOSURE SKIN 1/2X4 (GAUZE/BANDAGES/DRESSINGS) ×3 IMPLANT
SUT VIC AB 4-0 PS2 27 (SUTURE) ×3 IMPLANT
SUT VICRYL 0 UR6 27IN ABS (SUTURE) ×3 IMPLANT
SYR BULB IRRIGATION 50ML (SYRINGE) ×3 IMPLANT
SYR CONTROL 10ML LL (SYRINGE) ×3 IMPLANT
SYRINGE 10CC LL (SYRINGE) ×3 IMPLANT
TROCAR ENDO BLADELESS 11MM (ENDOMECHANICALS) ×3 IMPLANT
TROCAR XCEL NON-BLD 5MMX100MML (ENDOMECHANICALS) ×3 IMPLANT
TROCAR XCEL UNIV SLVE 11M 100M (ENDOMECHANICALS) ×3 IMPLANT
TUBING INSUFFLATION (TUBING) ×3 IMPLANT
WARMER LAPAROSCOPE (MISCELLANEOUS) ×3 IMPLANT

## 2013-07-06 NOTE — Op Note (Signed)
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  PRE-OPERATIVE DIAGNOSIS:  menorrhagia, dysmenorrhea, permanent sterilization  POST-OPERATIVE DIAGNOSIS:  menorrhagia, dysmenorrhea, permanent sterilization  PROCEDURE:  Procedure(s) with comments: DILATATION & CURETTAGE/HYSTEROSCOPY WITH THERMACHOICE ABLATION (N/A) - total therapt time- 9 minutes 2 seconds; 87C LAPAROSCOPIC BILATERAL SALPINGECTOMY (Bilateral)  SURGEON:  Surgeon(s) and Role:    * Tilda Burrow, MD - Primary  PHYSICIAN ASSISTANT:   ASSISTANTS: Witt, CST   ANESTHESIA:   local, general and paracervical block Details of procedure: Patient was taken operating room prepped and draped for abdominal and vaginal combined procedure after general anesthesia was introduced. See anesthesia records for details regarding intubation. Next timeout was conducted, surgical procedure confirmed by surgical team, and the deep umbilicus inspected and palpated and confirmed as having no hernias. Vertical skin incision was made in the umbilicus, as well as well as transverse suprapubic incision, and right lower quadrant, in the mid clavicular line. Veress needle was used through the umbilicus to achieve pneumoperitoneum, with water droplet technique used to confirm intraperitoneal location. Pneumoperitoneum was and 15-17 mm pressure due to patient's large girth. Laps very trocar was introduced through the umbilicus. The trocar barely reached into the abdomen. Suprapubic trocar was placed under direct visualization and the right sided 5 mm trocar also placed directly under visualization. Attention was directed to the left fallopian tube which could be identified to its fimbriated end, elevated, well away from the bowel and then using the LigaSure 5 mm coagulation and cutting device, the mesosalpinx was transected and the tubal specimen extracted for histology. Pedicles were hemostatic. On the right side a similar technique was used. 120  cc of saline was left in the abdomen, the abdomen deflated, the suprapubic and sent take the incision inspected. The the umbilical site. The deep Scarpa's fascia pulled together. The fascia itself could not be identified and Heaney site despite repeated attempts. All 3 trocar sites were closed using 4-0 Vicryl subcuticular fashion and source and procedure completed.  Endometrial ablation. Speculum was inserted in the vagina, and cervix grasped with single-tooth tenaculum, the uterus sounded to 9 cm, dilated to 25 [place] allowing introduction of the rigid 23 [place] 30 operative hysteroscope which showed a normal endometrial cavity with tubal ostia visualized and no evidence of trauma scrapings were obtained minimal tissue. The Gynecare ThermaChoice 3 endometrial ablation device was then prepared, and filled with 10 cc of D5W, and the 8 minute thermal ablation sequence completed with recovery of all fluid. Paracervical block using Marcaine was performed and patient to recovery room in stable condition ,sponge and needle counts were correct.

## 2013-07-06 NOTE — Brief Op Note (Signed)
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  PRE-OPERATIVE DIAGNOSIS:  menorrhagia, dysmenorrhea, permanent sterilization  POST-OPERATIVE DIAGNOSIS:  menorrhagia, dysmenorrhea, permanent sterilization  PROCEDURE:  Procedure(s) with comments: DILATATION & CURETTAGE/HYSTEROSCOPY WITH THERMACHOICE ABLATION (N/A) - total therapt time- 9 minutes 2 seconds; 87C LAPAROSCOPIC BILATERAL SALPINGECTOMY (Bilateral)  SURGEON:  Surgeon(s) and Role:    * Tilda Burrow, MD - Primary  PHYSICIAN ASSISTANT:   ASSISTANTS: Witt, CST   ANESTHESIA:   local, general and paracervical block  EBL:  Total I/O In: 1000 [I.V.:1000] Out: 200 [Urine:200]  BLOOD ADMINISTERED:none  DRAINS: none   LOCAL MEDICATIONS USED:  MARCAINE    and Amount: 20 ml  SPECIMEN:  Source of Specimen:  fallopian tubes  DISPOSITION OF SPECIMEN:  PATHOLOGY  COUNTS:  YES  TOURNIQUET:  * No tourniquets in log *  DICTATION: .Dragon Dictation  PLAN OF CARE: Discharge to home after PACU  PATIENT DISPOSITION:  PACU - hemodynamically stable.   Delay start of Pharmacological VTE agent (>24hrs) due to surgical blood loss or risk of bleeding: not applicable

## 2013-07-06 NOTE — Anesthesia Preprocedure Evaluation (Signed)
Anesthesia Evaluation  Patient identified by MRN, date of birth, ID band Patient awake    Reviewed: Allergy & Precautions, H&P , NPO status , Patient's Chart, lab work & pertinent test results  Airway Mallampati: I TM Distance: >3 FB Neck ROM: Full    Dental  (+) Teeth Intact   Pulmonary sleep apnea , Current Smoker,  breath sounds clear to auscultation        Cardiovascular + dysrhythmias Rhythm:Regular Rate:Normal     Neuro/Psych PSYCHIATRIC DISORDERS Anxiety Bipolar Disorder    GI/Hepatic negative GI ROS,   Endo/Other    Renal/GU Renal disease     Musculoskeletal   Abdominal   Peds  Hematology   Anesthesia Other Findings   Reproductive/Obstetrics                           Anesthesia Physical Anesthesia Plan  ASA: II  Anesthesia Plan: General   Post-op Pain Management:    Induction: Intravenous, Rapid sequence and Cricoid pressure planned  Airway Management Planned: Oral ETT  Additional Equipment:   Intra-op Plan:   Post-operative Plan: Extubation in OR  Informed Consent: I have reviewed the patients History and Physical, chart, labs and discussed the procedure including the risks, benefits and alternatives for the proposed anesthesia with the patient or authorized representative who has indicated his/her understanding and acceptance.     Plan Discussed with:   Anesthesia Plan Comments:         Anesthesia Quick Evaluation

## 2013-07-06 NOTE — H&P (View-Only) (Signed)
Ceriah [name] is an 27 y.o. female. She is a gravida 2, para 0020 ,no children, who presented in November for evaluation of her debilitating dysmenorrhea and heavy menstrual flow. BMI is 62 She has no desire for future children, and requests treatment for eliminating heavy flow and painful periods we have decided to perform endometrial ablation and at the same time proceed with bilateral salpingectomy for permanent sterilization, after lengthy discussion of pros and cons of various surgical and medical techniques The patient declines consideration of IUD having tried it once in the past "worst decision ever" only able to tolerate IUD for 2 months. She declines consideration of Implanon R. Nexplanon,. She would wish for consideration of hysterectomy but due to potential morbidity of surgery given her body weight we have agreed to proceed with endometrial ablation and salpingectomy as a less invasive alternative. Referral to tertiary surgery Center for hysterectomy been offered and declined by patient. Pertinent Gynecological History: Menses: flow is excessive with use of Both pads or tampons on heaviest days and regular every month without intermenstrual spotting Bleeding:  Contraception: condoms DES exposure: unknown Blood transfusions: none Sexually transmitted diseases: no past history Previous GYN Procedures:  Endometrial biopsy benign November 2014 Last mammogram:  Date:  Last pap: normal Date: 2013 thin Prep OB History: G2, P0020   Menstrual History: Menarche age:  Patient's last menstrual period was 06/07/2013.    Past Medical History  Diagnosis Date  . Bipolar disorder   . Tachycardia   . CIN I (cervical intraepithelial neoplasia I)   . OSA (obstructive sleep apnea)   . Anxiety   . Nephrolithiasis   . Enlarged heart     Past Surgical History  Procedure Laterality Date  . Bladder augmentation  2001    enlargement    Family History  Problem Relation Age of Onset  .  Hypertension Mother   . Depression Mother   . Diabetes Mother   . Cancer Mother     brain tumor  . Hypertension Father   . Diabetes Father   . Depression Father   . Hypertension Sister   . Diabetes Sister   . Depression Sister   . Depression Brother   . Diabetes Brother   . Hypertension Brother   . Cancer Maternal Aunt   . Cancer Paternal Aunt   . Hypertension Maternal Grandmother   . Diabetes Maternal Grandmother   . Depression Maternal Grandmother   . Hypertension Maternal Grandfather   . Diabetes Maternal Grandfather   . Depression Maternal Grandfather   . Hypertension Paternal Grandmother   . Diabetes Paternal Grandmother   . Depression Paternal Grandmother   . Hypertension Paternal Grandfather   . Diabetes Paternal Grandfather   . Depression Paternal Grandfather     Social History:  reports that she has been smoking Cigarettes.  She has been smoking about 6.00 packs per day. She has never used smokeless tobacco. She reports that she drinks alcohol. She reports that she does not use illicit drugs.  Allergies:  Allergies  Allergen Reactions  . Asa Buff (Mag [Buffered Aspirin] Shortness Of Breath  . Nsaids Shortness Of Breath     (Not in a hospital admission)  ROS  Last menstrual period 06/07/2013. Physical Exam  Constitutional: She is oriented to person, place, and time. She appears well-developed and well-nourished.  Morbidly obese African American female weight 398 pounds BMI 62  HENT:  Head: Normocephalic and atraumatic.  Eyes: Pupils are equal, round, and reactive to light.  Neck:   Normal range of motion. Neck supple.  Respiratory: Effort normal and breath sounds normal.  GI: Soft. Bowel sounds are normal.  No suspicion of umbilical hernia. Suprapubic trocar can likely be placed beneath the panniculus  Genitourinary: Vagina normal and uterus normal.  Generous vaginal length, may require a long speculum Cervix nonpurulent normal in appearance, uterus  anteflexed adnexa negative for masses  Musculoskeletal: Normal range of motion.  Neurological: She is alert and oriented to person, place, and time. She has normal reflexes.  Skin: Skin is warm and dry.  Psychiatric: She has a normal mood and affect. Her behavior is normal. Judgment and thought content normal.    No results found for this or any previous visit (from the past 24 hour(s)).  No results found.  Assessment/Plan: Assessment 1 dysmenorrhea, menorrhagia 2 morbid obesity 3 desire for permanent sterilization Plan: Laparoscopic bilateral salpingectomy for sterilization Hysteroscopy D&C endometrial ablation to be performed 07/06/2013 Risks of procedure rationale alternatives discussed at length with the patient during the decision making process  Belvin Gauss V 07/02/2013, 9:05 AM  

## 2013-07-06 NOTE — Anesthesia Postprocedure Evaluation (Signed)
  Anesthesia Post-op Note  Patient: Musician  Procedure(s) Performed: Procedure(s) with comments: DILATATION & CURETTAGE/HYSTEROSCOPY WITH THERMACHOICE ABLATION (N/A) - total therapt time- 9 minutes 2 seconds; 87C LAPAROSCOPIC BILATERAL SALPINGECTOMY (Bilateral)  Patient Location: PACU  Anesthesia Type:General  Level of Consciousness: awake  Airway and Oxygen Therapy: Patient Spontanous Breathing and Patient connected to face mask oxygen  Post-op Pain: none  Post-op Assessment: Post-op Vital signs reviewed, Patient's Cardiovascular Status Stable, Respiratory Function Stable, Patent Airway and No signs of Nausea or vomiting  Post-op Vital Signs: Reviewed and stable  Complications: No apparent anesthesia complications

## 2013-07-06 NOTE — Transfer of Care (Signed)
Immediate Anesthesia Transfer of Care Note  Patient: Musician  Procedure(s) Performed: Procedure(s) with comments: DILATATION & CURETTAGE/HYSTEROSCOPY WITH THERMACHOICE ABLATION (N/A) - total therapt time- 9 minutes 2 seconds; 87C LAPAROSCOPIC BILATERAL SALPINGECTOMY (Bilateral)  Patient Location: PACU  Anesthesia Type:General  Level of Consciousness: awake  Airway & Oxygen Therapy: Patient Spontanous Breathing and Patient connected to face mask oxygen  Post-op Assessment: Report given to PACU RN  Post vital signs: Reviewed and stable  Complications: No apparent anesthesia complications

## 2013-07-06 NOTE — Interval H&P Note (Signed)
History and Physical Interval Note:  07/06/2013 12:08 PM  Nancy [name]  has presented today for surgery, with the diagnosis of menorrhagia, dysmenorrhea, permanent sterilization  The various methods of treatment have been discussed with the patient and family. After consideration of risks, benefits and other options for treatment, the patient has consented to  Procedure(s): DILATATION & CURETTAGE/HYSTEROSCOPY WITH THERMACHOICE ABLATION (N/A) LAPAROSCOPIC BILATERAL SALPINGECTOMY (Bilateral) as a surgical intervention .  The patient's history has been reviewed, patient examined, no change in status, stable for surgery.  I have reviewed the patient's chart and labs.  Questions were answered to the patient's satisfaction.     Tilda Burrow

## 2013-07-06 NOTE — Anesthesia Procedure Notes (Signed)
Procedure Name: Intubation Date/Time: 07/06/2013 12:53 PM Performed by: Glynn Octave E Pre-anesthesia Checklist: Patient identified, Patient being monitored, Timeout performed, Emergency Drugs available and Suction available Patient Re-evaluated:Patient Re-evaluated prior to inductionOxygen Delivery Method: Circle System Utilized Preoxygenation: Pre-oxygenation with 100% oxygen Intubation Type: IV induction, Rapid sequence and Cricoid Pressure applied Ventilation: Mask ventilation without difficulty and Oral airway inserted - appropriate to patient size Laryngoscope Size: Mac and 3 Grade View: Grade II Tube type: Oral Tube size: 7.0 mm Number of attempts: 3 Airway Equipment and Method: stylet Placement Confirmation: ETT inserted through vocal cords under direct vision,  positive ETCO2 and breath sounds checked- equal and bilateral Secured at: 21 cm Tube secured with: Tape Dental Injury: Teeth and Oropharynx as per pre-operative assessment  Comments: Intubation attempt by CRNA unsuccessful with first attempt, could see arytenoids, unable to direct ETT through cords.  Repositioned head, mask ventilation without problems.  Second attempt unsuccessful, same problem.  3rd attempt Dr. Jayme Cloud used #4 glidescope without problems.  Dr. Jayme Cloud present through.out induction.

## 2013-07-09 ENCOUNTER — Encounter (HOSPITAL_COMMUNITY): Payer: Self-pay | Admitting: Obstetrics and Gynecology

## 2013-07-12 ENCOUNTER — Encounter: Payer: Self-pay | Admitting: Obstetrics and Gynecology

## 2013-07-12 ENCOUNTER — Encounter (INDEPENDENT_AMBULATORY_CARE_PROVIDER_SITE_OTHER): Payer: Self-pay

## 2013-07-12 ENCOUNTER — Ambulatory Visit (INDEPENDENT_AMBULATORY_CARE_PROVIDER_SITE_OTHER): Payer: 59 | Admitting: Obstetrics and Gynecology

## 2013-07-12 VITALS — BP 138/86 | Ht 67.0 in | Wt 393.0 lb

## 2013-07-12 DIAGNOSIS — Z9889 Other specified postprocedural states: Secondary | ICD-10-CM

## 2013-07-12 NOTE — Progress Notes (Signed)
Patient ID: Markeya Mincy, female   DOB: Jan 16, 1986, 27 y.o.   MRN: [id number]   Subjective:  Miyanna Wiersma is a 27 y.o. female who presents to the clinic 1 weeks status post laparoscopy.  She has been eating a regular diet without difficulty.  Bowel movements are normal. The patient is not having any pain.  Review of Systems Negative except as per HPI   Objective:  BP 138/86  Ht 5\' 7"  (1.702 m)  Wt 178.264 kg (393 lb)  BMI 61.54 kg/m2  LMP 06/07/2013 General:Well developed, well nourished.  No acute distress. Abdomen: Bowel sounds normal, soft, non-tender. Pelvic Exam: External Genitalia:  Normal. Vagina: Normal Bimanual: Normal Cervix: Normal Uterus: Normal Adnexa: Normal Incision(s):Healing well, no drainage, no erythema, no hernia, no swelling, no dehiscence, incision well approximated.   Assessment:  Post-Op s/p laparoscopy and salpingectomy 1 weeks ago.   Doing well postoperatively.   Plan:  1. Continue any current medications. 2. Wound care discussed. 3. Activity restrictions: none 4. Anticipated return to work: not applicable and 3 days. 5. Follow up in 1 year.

## 2013-07-13 ENCOUNTER — Telehealth: Payer: Self-pay | Admitting: Obstetrics and Gynecology

## 2013-07-13 NOTE — Telephone Encounter (Signed)
Pt informed FMLA completed and faxed.  

## 2014-02-01 ENCOUNTER — Emergency Department (HOSPITAL_COMMUNITY)
Admission: EM | Admit: 2014-02-01 | Discharge: 2014-02-01 | Disposition: A | Discharge status: Home / Self Care | Payer: Managed Care, Other (non HMO) | Attending: Emergency Medicine | Admitting: Emergency Medicine

## 2014-02-01 DIAGNOSIS — Z8669 Personal history of other diseases of the nervous system and sense organs: Secondary | ICD-10-CM | POA: Insufficient documentation

## 2014-02-01 DIAGNOSIS — Z8659 Personal history of other mental and behavioral disorders: Secondary | ICD-10-CM | POA: Insufficient documentation

## 2014-02-01 DIAGNOSIS — R0609 Other forms of dyspnea: Secondary | ICD-10-CM | POA: Insufficient documentation

## 2014-02-01 DIAGNOSIS — R0989 Other specified symptoms and signs involving the circulatory and respiratory systems: Secondary | ICD-10-CM | POA: Insufficient documentation

## 2014-02-01 DIAGNOSIS — R21 Rash and other nonspecific skin eruption: Secondary | ICD-10-CM | POA: Insufficient documentation

## 2014-02-01 DIAGNOSIS — F17200 Nicotine dependence, unspecified, uncomplicated: Secondary | ICD-10-CM | POA: Insufficient documentation

## 2014-02-01 DIAGNOSIS — Z8679 Personal history of other diseases of the circulatory system: Secondary | ICD-10-CM | POA: Insufficient documentation

## 2014-02-01 DIAGNOSIS — Z87442 Personal history of urinary calculi: Secondary | ICD-10-CM | POA: Insufficient documentation

## 2014-02-01 DIAGNOSIS — T7840XA Allergy, unspecified, initial encounter: Secondary | ICD-10-CM

## 2014-02-01 DIAGNOSIS — Z8742 Personal history of other diseases of the female genital tract: Secondary | ICD-10-CM | POA: Insufficient documentation

## 2014-02-01 DIAGNOSIS — T4995XA Adverse effect of unspecified topical agent, initial encounter: Secondary | ICD-10-CM | POA: Insufficient documentation

## 2014-02-01 MED ORDER — HYDROXYZINE HCL 25 MG PO TABS
25.0000 mg | ORAL_TABLET | Freq: Once | ORAL | Status: AC
  Administered 2014-02-01: 25 mg via ORAL
  Filled 2014-02-01: qty 1

## 2014-02-01 MED ORDER — HYDROXYZINE HCL 25 MG PO TABS: 25.0000 mg | ORAL_TABLET | Freq: Four times a day (QID) | ORAL | Status: DC | PRN

## 2014-02-01 MED ORDER — PREDNISONE 20 MG PO TABS
60.0000 mg | ORAL_TABLET | Freq: Once | ORAL | Status: AC
  Administered 2014-02-01: 60 mg via ORAL
  Filled 2014-02-01: qty 3

## 2014-02-01 MED ORDER — FAMOTIDINE 20 MG PO TABS
20.0000 mg | ORAL_TABLET | Freq: Once | ORAL | Status: AC
  Administered 2014-02-01: 20 mg via ORAL
  Filled 2014-02-01: qty 1

## 2014-02-01 MED ORDER — PREDNISONE 20 MG PO TABS: 40.0000 mg | ORAL_TABLET | Freq: Every day | ORAL | Status: DC

## 2014-02-01 MED ORDER — DIPHENHYDRAMINE HCL 25 MG PO CAPS
25.0000 mg | ORAL_CAPSULE | Freq: Once | ORAL | Status: AC
  Administered 2014-02-01: 25 mg via ORAL
  Filled 2014-02-01: qty 1

## 2014-02-01 MED ORDER — EPINEPHRINE 0.3 MG/0.3ML IJ SOAJ: 0.3000 mg | Freq: Once | INTRAMUSCULAR | Status: DC

## 2014-02-01 MED ORDER — DIPHENHYDRAMINE HCL 25 MG PO CAPS: 25.0000 mg | ORAL_CAPSULE | Freq: Four times a day (QID) | ORAL | Status: DC

## 2014-02-01 MED ORDER — FAMOTIDINE 20 MG PO TABS: 20.0000 mg | ORAL_TABLET | Freq: Two times a day (BID) | ORAL | Status: DC

## 2014-02-01 MED ORDER — EPINEPHRINE 0.3 MG/0.3ML IJ SOAJ
0.3000 mg | Freq: Once | INTRAMUSCULAR | Status: AC
  Administered 2014-02-01: 0.3 mg via INTRAMUSCULAR
  Filled 2014-02-01: qty 0.3

## 2014-02-01 NOTE — ED Provider Notes (Signed)
CSN: [id number]     Arrival date & time 02/01/14  1029 History   First MD Initiated Contact with Patient 02/01/14 1038     No chief complaint on file.    (Consider location/radiation/quality/duration/timing/severity/associated sxs/prior Treatment) Patient is a 28 y.o. female presenting with allergic reaction. The history is provided by the patient.  Allergic Reaction Presenting symptoms: difficulty breathing, itching and rash (face)   Presenting symptoms: no difficulty swallowing   Itching:    Location:  Face   Severity:  Moderate   Onset quality:  Sudden   Timing:  Constant   Progression:  Unchanged Severity:  Moderate Prior allergic episodes:  No prior episodes Context comment:  Unknown, began spontaneously at work Relieved by:  Nothing Worsened by:  Nothing tried Ineffective treatments:  Antihistamines   Past Medical History  Diagnosis Date  . Bipolar disorder   . Tachycardia   . CIN I (cervical intraepithelial neoplasia I)   . OSA (obstructive sleep apnea)   . Anxiety   . Nephrolithiasis   . Enlarged heart    Past Surgical History  Procedure Laterality Date  . Bladder augmentation  2001    enlargement  . Dilitation & currettage/hystroscopy with thermachoice ablation N/A 07/06/2013    Procedure: DILATATION & CURETTAGE/HYSTEROSCOPY WITH THERMACHOICE ABLATION;  Surgeon: Tilda BurrowJohn V Ferguson, MD;  Location: [facility];  Service: Gynecology;  Laterality: N/A;  total therapt time- 9 minutes 2 seconds; 87C  . Laparoscopic bilateral salpingectomy Bilateral 07/06/2013    Procedure: LAPAROSCOPIC BILATERAL SALPINGECTOMY;  Surgeon: Tilda BurrowJohn V Ferguson, MD;  Location: [facility];  Service: Gynecology;  Laterality: Bilateral;   Family History  Problem Relation Age of Onset  . Hypertension Mother   . Depression Mother   . Diabetes Mother   . Cancer Mother     brain tumor  . Hypertension Father   . Diabetes Father   . Depression Father   . Hypertension Sister   . Diabetes Sister   .  Depression Sister   . Depression Brother   . Diabetes Brother   . Hypertension Brother   . Cancer Maternal Aunt   . Cancer Paternal Aunt   . Hypertension Maternal Grandmother   . Diabetes Maternal Grandmother   . Depression Maternal Grandmother   . Hypertension Maternal Grandfather   . Diabetes Maternal Grandfather   . Depression Maternal Grandfather   . Hypertension Paternal Grandmother   . Diabetes Paternal Grandmother   . Depression Paternal Grandmother   . Hypertension Paternal Grandfather   . Diabetes Paternal Grandfather   . Depression Paternal Grandfather    History  Substance Use Topics  . Smoking status: Current Every Day Smoker -- 0.25 packs/day    Types: Cigarettes  . Smokeless tobacco: Never Used  . Alcohol Use: Yes     Comment: occasionally   OB History   Grav Para Term Preterm Abortions TAB SAB Ect Mult Living                 Review of Systems  Constitutional: Negative for fever and chills.  HENT: Negative for sinus pressure and trouble swallowing.        Feels like throat is closing up  Respiratory: Negative for cough and shortness of breath.   Gastrointestinal: Negative for vomiting.  Skin: Positive for itching and rash (face).  All other systems reviewed and are negative.     Allergies  Asa buff (mag and Nsaids  Home Medications   Prior to Admission medications   Medication Sig  Start Date End Date Taking? Authorizing Provider  HYDROcodone-acetaminophen (NORCO) 7.5-325 MG per tablet Take 1-2 tablets by mouth every 6 (six) hours as needed for moderate pain or severe pain. 07/06/13   Tilda BurrowJohn Ferguson V, MD  ibuprofen (ADVIL,MOTRIN) 800 MG tablet Take 1 tablet (800 mg total) by mouth every 8 (eight) hours as needed. 06/09/13   Tilda BurrowJohn Ferguson V, MD   BP 168/103  Pulse 99  Temp(Src) 98.6 F (37 C) (Oral)  Resp 20  SpO2 100% Physical Exam  Nursing note and vitals reviewed. Constitutional: She is oriented to person, place, and time. She appears  well-developed and well-nourished. No distress.  HENT:  Head: Normocephalic and atraumatic.  Mouth/Throat: Oropharynx is clear and moist.  No lip swelling  Eyes: EOM are normal. Pupils are equal, round, and reactive to light.  Neck: Normal range of motion. Neck supple. No tracheal deviation present.  Cardiovascular: Normal rate and regular rhythm.  Exam reveals no friction rub.   No murmur heard. Pulmonary/Chest: Effort normal and breath sounds normal. No stridor. No respiratory distress. She has no wheezes. She has no rales.  Abdominal: Soft. She exhibits no distension. There is no tenderness. There is no rebound.  Musculoskeletal: Normal range of motion. She exhibits no edema.  Neurological: She is alert and oriented to person, place, and time.  Skin: Rash (mild redness on bilateral cheeks, no hives otherwise, no rash or other purpura/petechiae) noted. She is not diaphoretic.    ED Course  Procedures (including critical care time) Labs Review Labs Reviewed - No data to display  Imaging Review No results found.   EKG Interpretation None      MDM   Final diagnoses:  Allergic reaction, initial encounter    46F with no hx of anaphylaxis presents with itchiness and throat swelling sensation. No hx of angioedema, no hx of HTN, not on ACE inhibitors, no family hx of angioedema that she's aware of. AFVSS. No stridor, airway is patent, no wheezing. No lip swelling. Mild redness on bilateral cheeks, no other hives. Unclear cause of her allergy, but with throat closing sensation and hives, will treat with Epi, steroids, H1 and H2 blockers. AFter 3 hours of observation, doing well. Sensation of throat closing up resolved. Unsure of cause, instructed patient to f/u with allergist.  Dagmar HaitWilliam Keyarah Mcroy, MD 02/01/14 [phone]

## 2014-02-01 NOTE — ED Notes (Signed)
Pt states is having allergic reaction, but does not know to what.  Is complaining of SOB, pt is changing into gown EKG will be taken at this time.

## 2014-02-01 NOTE — ED Notes (Signed)
Bed: WA04 Expected date:  Expected time:  Means of arrival:  Comments: 

## 2014-02-01 NOTE — ED Notes (Signed)
Initial Contact - pt resting on stretcher, family at bedside, pt reports feeling improved but still continues to c/o mild SOB, mild itching and mild swelling to face.  Speaking full/clear sentences, rr even/un-lab.  No resp distress.  Skin PWD.  MAEI.  A+Ox4.  NAD.

## 2014-12-11 ENCOUNTER — Emergency Department (HOSPITAL_COMMUNITY): Payer: Managed Care, Other (non HMO)

## 2014-12-11 ENCOUNTER — Encounter (HOSPITAL_COMMUNITY): Payer: Self-pay | Admitting: Emergency Medicine

## 2014-12-11 ENCOUNTER — Emergency Department (HOSPITAL_COMMUNITY)
Admission: EM | Admit: 2014-12-11 | Discharge: 2014-12-11 | Disposition: A | Discharge status: Home / Self Care | Payer: Managed Care, Other (non HMO) | Attending: Emergency Medicine | Admitting: Emergency Medicine

## 2014-12-11 DIAGNOSIS — W010XXA Fall on same level from slipping, tripping and stumbling without subsequent striking against object, initial encounter: Secondary | ICD-10-CM | POA: Insufficient documentation

## 2014-12-11 DIAGNOSIS — Y9222 Religious institution as the place of occurrence of the external cause: Secondary | ICD-10-CM | POA: Diagnosis not present

## 2014-12-11 DIAGNOSIS — Z8669 Personal history of other diseases of the nervous system and sense organs: Secondary | ICD-10-CM | POA: Diagnosis not present

## 2014-12-11 DIAGNOSIS — Z8541 Personal history of malignant neoplasm of cervix uteri: Secondary | ICD-10-CM | POA: Diagnosis not present

## 2014-12-11 DIAGNOSIS — Y9389 Activity, other specified: Secondary | ICD-10-CM | POA: Diagnosis not present

## 2014-12-11 DIAGNOSIS — S3992XA Unspecified injury of lower back, initial encounter: Secondary | ICD-10-CM | POA: Diagnosis not present

## 2014-12-11 DIAGNOSIS — M25562 Pain in left knee: Secondary | ICD-10-CM | POA: Insufficient documentation

## 2014-12-11 DIAGNOSIS — Z8679 Personal history of other diseases of the circulatory system: Secondary | ICD-10-CM | POA: Insufficient documentation

## 2014-12-11 DIAGNOSIS — Z720 Tobacco use: Secondary | ICD-10-CM | POA: Diagnosis not present

## 2014-12-11 DIAGNOSIS — Y998 Other external cause status: Secondary | ICD-10-CM | POA: Diagnosis not present

## 2014-12-11 DIAGNOSIS — S93402A Sprain of unspecified ligament of left ankle, initial encounter: Secondary | ICD-10-CM | POA: Diagnosis not present

## 2014-12-11 DIAGNOSIS — F419 Anxiety disorder, unspecified: Secondary | ICD-10-CM | POA: Insufficient documentation

## 2014-12-11 DIAGNOSIS — F329 Major depressive disorder, single episode, unspecified: Secondary | ICD-10-CM | POA: Insufficient documentation

## 2014-12-11 DIAGNOSIS — M25462 Effusion, left knee: Secondary | ICD-10-CM | POA: Insufficient documentation

## 2014-12-11 DIAGNOSIS — S99912A Unspecified injury of left ankle, initial encounter: Secondary | ICD-10-CM | POA: Diagnosis present
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
## 2014-12-11 IMAGING — DX DG ANKLE COMPLETE 3+V*L*
3 series · 3 of 3 positions shown · non-contrast
Comparison: Left ankle radiographs performed [DATE]

CLINICAL DATA: Status post fall; concern for left ankle injury.
Initial encounter.

EXAM:
LEFT ANKLE COMPLETE - 3+ VIEW

[ankle ap]
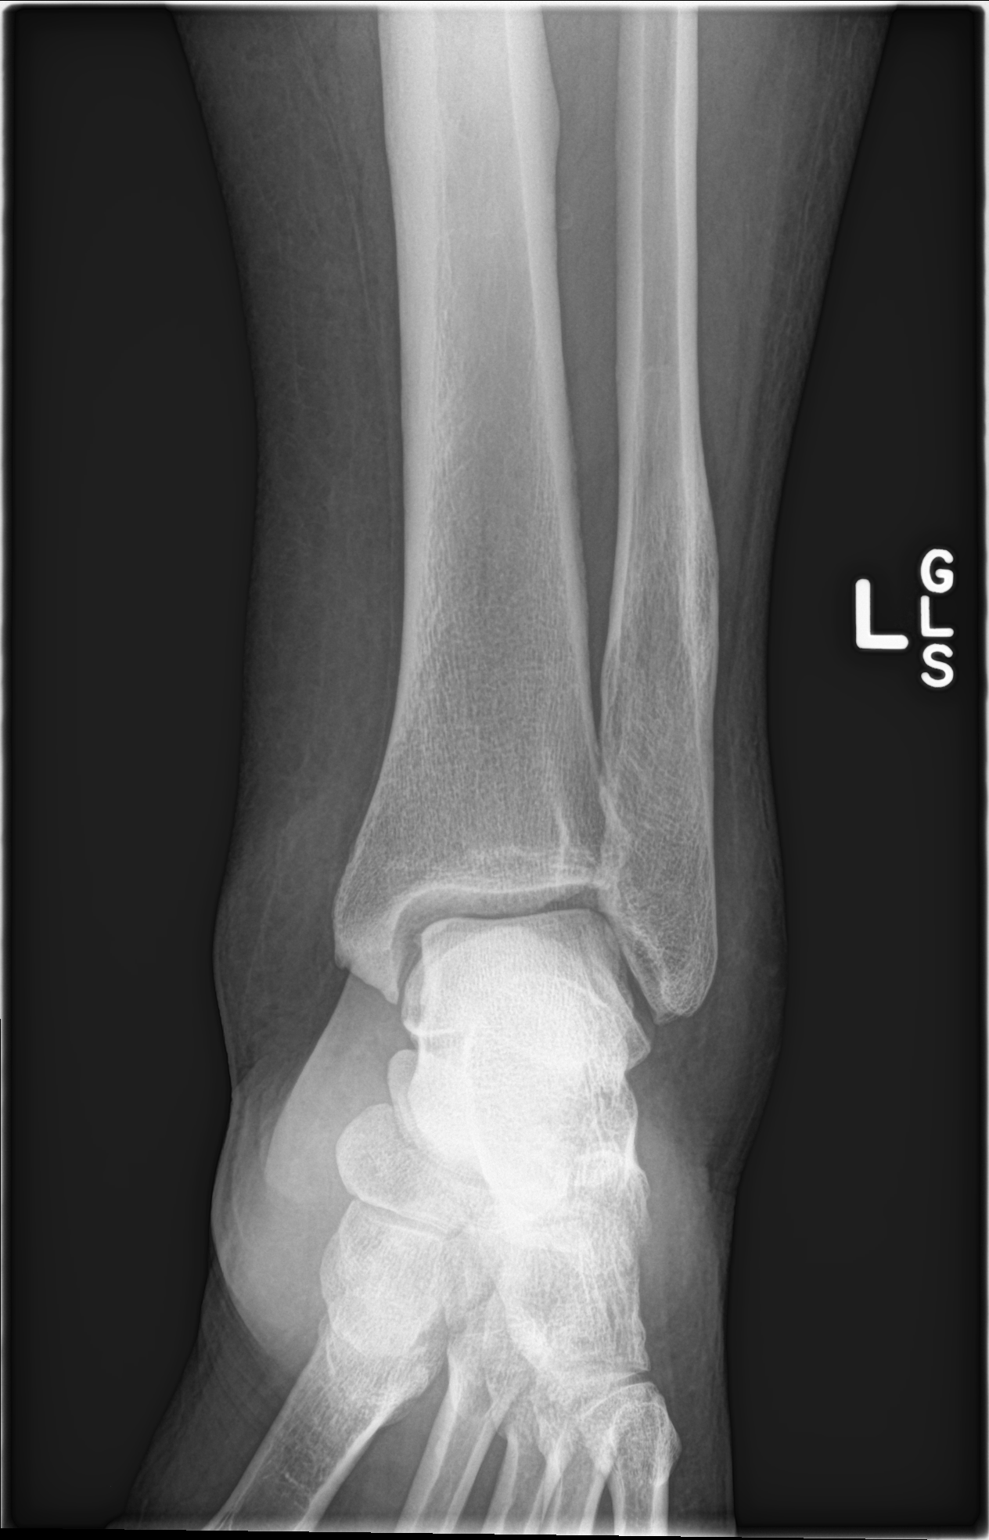

[ankle obl]
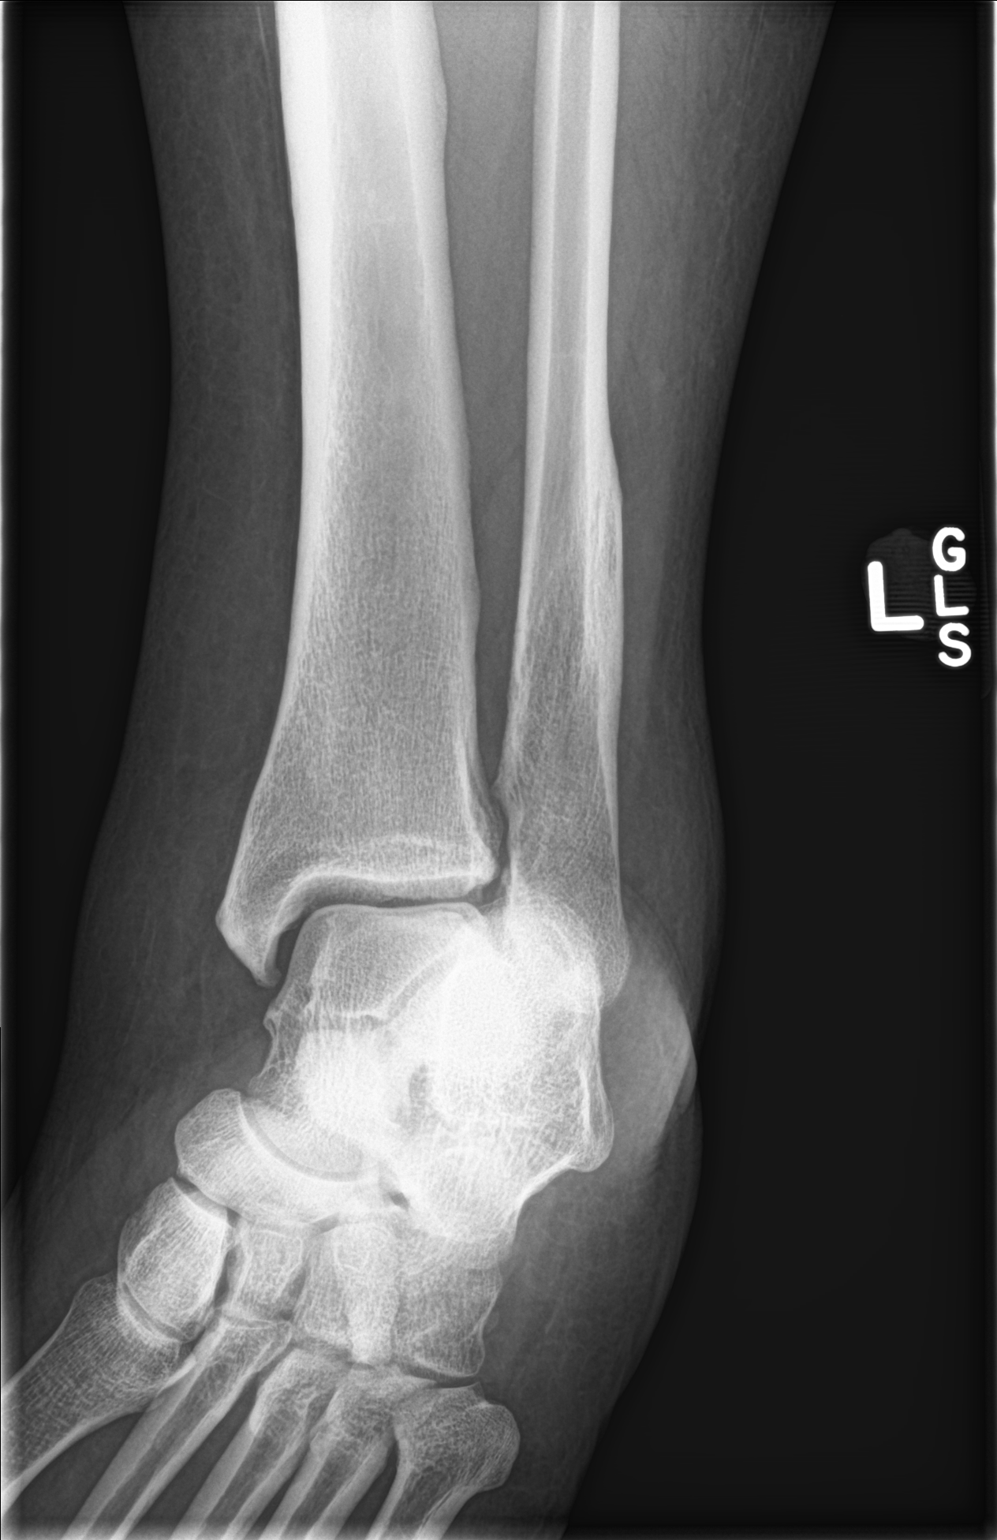

[ankle lat]
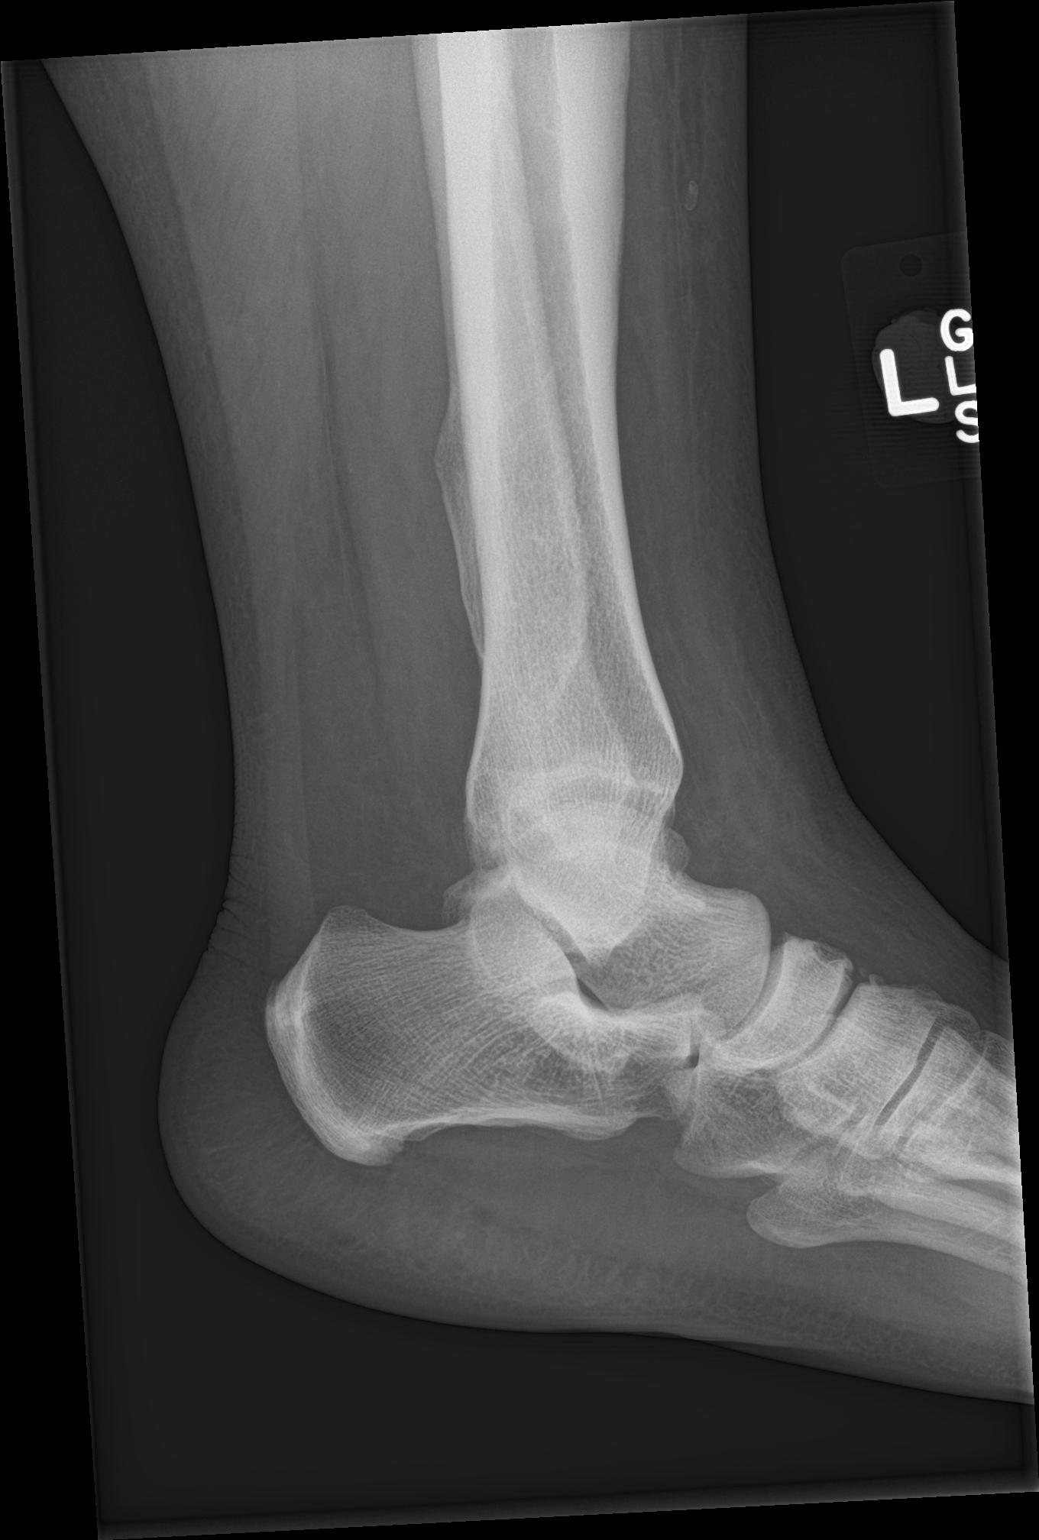

[3 of 3 positions shown; findings below may reference images not displayed]

FINDINGS: There is no evidence of fracture or dislocation. The ankle mortise
is intact; the interosseous space is within normal limits. No talar
tilt or subluxation is seen. There is mild chronic deformity
involving the distal fibula.

The joint spaces are preserved. Diffuse soft tissue swelling is
noted about the ankle.
IMPRESSION: No evidence of fracture or dislocation.

## 2014-12-11 MED ORDER — TRAMADOL HCL 50 MG PO TABS: 50.0000 mg | ORAL_TABLET | Freq: Four times a day (QID) | ORAL | Status: DC | PRN

## 2014-12-11 NOTE — ED Notes (Signed)
PT states she slipped and fell onto her left side coming out of church today and c/o left knee and left ankle pain. PT ambulatory in triage with NAD noted.
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
## 2014-12-11 NOTE — Discharge Instructions (Signed)
Ankle Sprain °An ankle sprain is an injury to the strong, fibrous tissues (ligaments) that hold the bones of your ankle joint together.  °CAUSES °An ankle sprain is usually caused by a fall or by twisting your ankle. Ankle sprains most commonly occur when you step on the outer edge of your foot, and your ankle turns inward. People who participate in sports are more prone to these types of injuries.  °SYMPTOMS  °· Pain in your ankle. The pain may be present at rest or only when you are trying to stand or walk. °· Swelling. °· Bruising. Bruising may develop immediately or within 1 to 2 days after your injury. °· Difficulty standing or walking, particularly when turning corners or changing directions. °DIAGNOSIS  °Your caregiver will ask you details about your injury and perform a physical exam of your ankle to determine if you have an ankle sprain. During the physical exam, your caregiver will press on and apply pressure to specific areas of your foot and ankle. Your caregiver will try to move your ankle in certain ways. An X-ray exam may be done to be sure a bone was not broken or a ligament did not separate from one of the bones in your ankle (avulsion fracture).  °TREATMENT  °Certain types of braces can help stabilize your ankle. Your caregiver can make a recommendation for this. Your caregiver may recommend the use of medicine for pain. If your sprain is severe, your caregiver may refer you to a surgeon who helps to restore function to parts of your skeletal system (orthopedist) or a physical therapist. °HOME CARE INSTRUCTIONS  °· Apply ice to your injury for 1-2 days or as directed by your caregiver. Applying ice helps to reduce inflammation and pain. °· Put ice in a plastic bag. °· Place a towel between your skin and the bag. °· Leave the ice on for 15-20 minutes at a time, every 2 hours while you are awake. °· Only take over-the-counter or prescription medicines for pain, discomfort, or fever as directed by  your caregiver. °· Elevate your injured ankle above the level of your heart as much as possible for 2-3 days. °· If your caregiver recommends crutches, use them as instructed. Gradually put weight on the affected ankle. Continue to use crutches or a cane until you can walk without feeling pain in your ankle. °· If you have a plaster splint, wear the splint as directed by your caregiver. Do not rest it on anything harder than a pillow for the first 24 hours. Do not put weight on it. Do not get it wet. You may take it off to take a shower or bath. °· You may have been given an elastic bandage to wear around your ankle to provide support. If the elastic bandage is too tight (you have numbness or tingling in your foot or your foot becomes cold and blue), adjust the bandage to make it comfortable. °· If you have an air splint, you may blow more air into it or let air out to make it more comfortable. You may take your splint off at night and before taking a shower or bath. Wiggle your toes in the splint several times per day to decrease swelling. °SEEK MEDICAL CARE IF:  °· You have rapidly increasing bruising or swelling. °· Your toes feel extremely cold or you lose feeling in your foot. °· Your pain is not relieved with medicine. °SEEK IMMEDIATE MEDICAL CARE IF: °· Your toes are numb or blue. °·   You have severe pain that is increasing. MAKE SURE YOU:   Understand these instructions.  Will watch your condition.  Will get help right away if you are not doing well or get worse. Document Released: 07/15/2005 Document Revised: 04/08/2012 Document Reviewed: 07/27/2011 [facility] Patient Information 2015 [place], [place]. This information is not intended to replace advice given to you by your health care provider. Make sure you discuss any questions you have with your health care provider.  Knee Effusion The medical term for having fluid in your knee is effusion. This is often due to an internal derangement of the  knee. This means something is wrong inside the knee. Some of the causes of fluid in the knee may be torn cartilage, a torn ligament, or bleeding into the joint from an injury. Your knee is likely more difficult to bend and move. This is often because there is increased pain and pressure in the joint. The time it takes for recovery from a knee effusion depends on different factors, including:   Type of injury.  Your age.  Physical and medical conditions.  Rehabilitation Strategies. How long you will be away from your normal activities will depend on what kind of knee problem you have and how much damage is present. Your knee has two types of cartilage. Articular cartilage covers the bone ends and lets your knee bend and move smoothly. Two menisci, thick pads of cartilage that form a rim inside the joint, help absorb shock and stabilize your knee. Ligaments bind the bones together and support your knee joint. Muscles move the joint, help support your knee, and take stress off the joint itself. CAUSES  Often an effusion in the knee is caused by an injury to one of the menisci. This is often a tear in the cartilage. Recovery after a meniscus injury depends on how much meniscus is damaged and whether you have damaged other knee tissue. Small tears may heal on their own with conservative treatment. Conservative means rest, limited weight bearing activity and muscle strengthening exercises. Your recovery may take up to 6 weeks.  TREATMENT  Larger tears may require surgery. Meniscus injuries may be treated during arthroscopy. Arthroscopy is a procedure in which your surgeon uses a small telescope like instrument to look in your knee. Your caregiver can make a more accurate diagnosis (learning what is wrong) by performing an arthroscopic procedure. If your injury is on the inner margin of the meniscus, your surgeon may trim the meniscus back to a smooth rim. In other cases your surgeon will try to repair a  damaged meniscus with stitches (sutures). This may make rehabilitation take longer, but may provide better long term result by helping your knee keep its shock absorption capabilities. Ligaments which are completely torn usually require surgery for repair. HOME CARE INSTRUCTIONS  Use crutches as instructed.  If a brace is applied, use as directed.  Once you are home, an ice pack applied to your swollen knee may help with discomfort and help decrease swelling.  Keep your knee raised (elevated) when you are not up and around or on crutches.  Only take over-the-counter or prescription medicines for pain, discomfort, or fever as directed by your caregiver.  Your caregivers will help with instructions for rehabilitation of your knee. This often includes strengthening exercises.  You may resume a normal diet and activities as directed. SEEK MEDICAL CARE IF:   There is increased swelling in your knee.  You notice redness, swelling, or increasing pain in  your knee.  An unexplained oral temperature above 102 F (38.9 C) develops. SEEK IMMEDIATE MEDICAL CARE IF:   You develop a rash.  You have difficulty breathing.  You have any allergic reactions from medications you may have been given.  There is severe pain with any motion of the knee. MAKE SURE YOU:   Understand these instructions.  Will watch your condition.  Will get help right away if you are not doing well or get worse. Document Released: 10/05/2003 Document Revised: 10/07/2011 Document Reviewed: 12/09/2007 [facility] Patient Information 2015 [place]. This information is not intended to replace advice given to you by your health care provider. Make sure you discuss any questions you have with your health care provider.    Wear the ASO and and the ace wrap to help protect your ankle and knee.  Use ice and elevation as much as possible for the next several days to help reduce the swelling.  Take the medications  prescribed.  You may take the tramadol prescribed for pain relief.  This will make you drowsy - do not drive within 4 hours of taking this medication.    Call the orthopedic doctor listed for a recheck of your injury in 10 days if not improving with this treatment.  You may benefit from physical therapy if your ankle and knee are not improving by then.

## 2014-12-14 ENCOUNTER — Ambulatory Visit (INDEPENDENT_AMBULATORY_CARE_PROVIDER_SITE_OTHER): Payer: Managed Care, Other (non HMO) | Admitting: Orthopedic Surgery

## 2014-12-14 ENCOUNTER — Encounter: Payer: Self-pay | Admitting: Orthopedic Surgery

## 2014-12-14 VITALS — BP 123/88 | Ht 67.0 in | Wt 360.0 lb

## 2014-12-14 DIAGNOSIS — M5442 Lumbago with sciatica, left side: Secondary | ICD-10-CM | POA: Diagnosis not present

## 2014-12-14 DIAGNOSIS — S93402A Sprain of unspecified ligament of left ankle, initial encounter: Secondary | ICD-10-CM | POA: Diagnosis not present

## 2014-12-14 MED ORDER — HYDROCODONE-ACETAMINOPHEN 5-325 MG PO TABS: 1.0000 | ORAL_TABLET | Freq: Four times a day (QID) | ORAL | Status: DC | PRN

## 2014-12-14 NOTE — Progress Notes (Signed)
Patient ID: Nancy [name], female   DOB: 02/28/1986, 29 y.o.   MRN: [id number]  Chief Complaint  Patient presents with  . Knee Pain    ER follow up, left knee pain and swelling and left ankle sprain, DOI 12/11/14 FALL     Nancy [name] is a 29 y.o. female.   HPI This 29 year old female presents for evaluation of left knee pain and swelling left knee giving way and left ankle pain after tripping on the last step at her church. However she was having instability episodes in her left knee prior to that. She was also being seen by chiropractor for ongoing back problems   Review of Systems No fever, no numbness or tingling in the left leg. She does report weakness in the left leg.  Past Medical History  Diagnosis Date  . Bipolar disorder   . Tachycardia   . CIN I (cervical intraepithelial neoplasia I)   . OSA (obstructive sleep apnea)   . Anxiety   . Nephrolithiasis   . Enlarged heart     Past Surgical History  Procedure Laterality Date  . Bladder augmentation  2001    enlargement  . Dilitation & currettage/hystroscopy with thermachoice ablation N/A 07/06/2013    Procedure: DILATATION & CURETTAGE/HYSTEROSCOPY WITH THERMACHOICE ABLATION;  Surgeon: Tilda BurrowJohn V Ferguson, MD;  Location: [facility];  Service: Gynecology;  Laterality: N/A;  total therapt time- 9 minutes 2 seconds; 87C  . Laparoscopic bilateral salpingectomy Bilateral 07/06/2013    Procedure: LAPAROSCOPIC BILATERAL SALPINGECTOMY;  Surgeon: Tilda BurrowJohn V Ferguson, MD;  Location: [facility];  Service: Gynecology;  Laterality: Bilateral;    Family History  Problem Relation Age of Onset  . Hypertension Mother   . Depression Mother   . Diabetes Mother   . Cancer Mother     brain tumor  . Hypertension Father   . Diabetes Father   . Depression Father   . Hypertension Sister   . Diabetes Sister   . Depression Sister   . Depression Brother   . Diabetes Brother   . Hypertension Brother   . Cancer Maternal Aunt   . Cancer Paternal  Aunt   . Hypertension Maternal Grandmother   . Diabetes Maternal Grandmother   . Depression Maternal Grandmother   . Hypertension Maternal Grandfather   . Diabetes Maternal Grandfather   . Depression Maternal Grandfather   . Hypertension Paternal Grandmother   . Diabetes Paternal Grandmother   . Depression Paternal Grandmother   . Hypertension Paternal Grandfather   . Diabetes Paternal Grandfather   . Depression Paternal Grandfather     Social History History  Substance Use Topics  . Smoking status: Current Every Day Smoker -- 0.50 packs/day    Types: Cigarettes  . Smokeless tobacco: Never Used  . Alcohol Use: Yes     Comment: occasionally    Allergies  Allergen Reactions  . Asa Buff (Mag [Buffered Aspirin] Shortness Of Breath  . Aspirin Anaphylaxis and Shortness Of Breath  . Tolmetin Shortness Of Breath    Current Outpatient Prescriptions  Medication Sig Dispense Refill  . ibuprofen (ADVIL,MOTRIN) 200 MG tablet Take 800 mg by mouth every 6 (six) hours as needed for headache, mild pain or moderate pain.    Marland Kitchen. HYDROcodone-acetaminophen (NORCO/VICODIN) 5-325 MG per tablet Take 1 tablet by mouth every 6 (six) hours as needed for moderate pain. 28 tablet 0  . traMADol (ULTRAM) 50 MG tablet Take 1 tablet (50 mg total) by mouth every 6 (six) hours as needed. (Patient not  taking: Reported on 12/14/2014) 20 tablet 0   No current facility-administered medications for this visit.       Physical Exam Blood pressure 123/88, height 5\' 7"  (1.702 m), weight 360 lb (163.295 kg). Physical Exam The patient is well developed well nourished and well groomed. Orientation to person place and time is normal  Mood is pleasant. Ambulatory status she is ambulatory there are no assistive devices she was wearing an ankle brace from the ER  She has tenderness in her lower back that a negative straight leg raise. She has normal range of motion in the left hip and no instability.   Left ankle  medial and lateral tenderness deltoid and anterior talofibular ligament. She has a flexible pes planus in the foot with a stable drawer test.  The left knee is stable. Painless range of motion mild peripatellar tenderness with no effusion. Motor exam left leg normal  Left leg skin is normal  No sensory deficits are detected and good pulses are noted in the foot  Data Reviewed Imaging I reviewed her knee x-ray was normal her ankle x-ray was normal her old MRI of her thoracic and cervical spine were normal  Assessment I think we can get back x-rays from Dr. Lubertha Basqueaylor's office so she doesn't get charged for new x-rays of the back. I think she will need physical therapy for her back and her ankle. I think her knee is giving way because of her back problem Plan Our plan will be for physical therapy 2 times a week for 6 weeks. She will follow-up with us in 4 weeks.  She will take Norco 5 mg 1 every 6 #28. She is allergic to aspirin causes anaphylactic shock but she is not allergic to ibuprofen. She did get sick taken tramadol and then she tried some Opana and got sick for obvious reasons  Encounter Diagnoses  Name Primary?  . Left-sided low back pain with left-sided sciatica Yes  . Left ankle sprain, initial encounter

## 2014-12-14 NOTE — Patient Instructions (Signed)
Call back to advise where you would like to do physical therapy and we will fax order

## 2014-12-19 ENCOUNTER — Other Ambulatory Visit: Payer: Self-pay | Admitting: Orthopedic Surgery

## 2014-12-19 MED ORDER — HYDROCODONE-ACETAMINOPHEN 5-325 MG PO TABS: 1.0000 | ORAL_TABLET | Freq: Four times a day (QID) | ORAL | Status: DC | PRN

## 2014-12-21 ENCOUNTER — Telehealth: Payer: Self-pay | Admitting: Orthopedic Surgery

## 2014-12-21 NOTE — Telephone Encounter (Signed)
Patient pick up RX for HYDROcodone-acetaminophen (NORCO/VICODIN) 5-325 MG per tablet

## 2015-01-02 ENCOUNTER — Other Ambulatory Visit: Payer: Self-pay | Admitting: Orthopedic Surgery

## 2015-01-02 ENCOUNTER — Encounter: Payer: Self-pay | Admitting: Orthopedic Surgery

## 2015-01-02 DIAGNOSIS — M5430 Sciatica, unspecified side: Secondary | ICD-10-CM

## 2015-01-02 MED ORDER — GABAPENTIN 100 MG PO CAPS: 100.0000 mg | ORAL_CAPSULE | Freq: Three times a day (TID) | ORAL | Status: DC

## 2015-01-03 ENCOUNTER — Ambulatory Visit: Payer: Managed Care, Other (non HMO)

## 2015-01-05 ENCOUNTER — Ambulatory Visit: Payer: Managed Care, Other (non HMO)

## 2015-01-10 ENCOUNTER — Ambulatory Visit: Payer: Managed Care, Other (non HMO)

## 2015-01-12 ENCOUNTER — Encounter: Payer: Self-pay | Admitting: Orthopedic Surgery

## 2015-01-12 ENCOUNTER — Ambulatory Visit: Payer: Managed Care, Other (non HMO)

## 2015-01-16 ENCOUNTER — Ambulatory Visit: Payer: Managed Care, Other (non HMO) | Admitting: Rehabilitation

## 2015-01-17 ENCOUNTER — Ambulatory Visit: Payer: Managed Care, Other (non HMO) | Admitting: Orthopedic Surgery

## 2015-01-19 ENCOUNTER — Ambulatory Visit: Payer: Managed Care, Other (non HMO)

## 2015-01-24 ENCOUNTER — Ambulatory Visit: Payer: Managed Care, Other (non HMO) | Admitting: Rehabilitation

## 2015-01-26 ENCOUNTER — Ambulatory Visit: Payer: Managed Care, Other (non HMO)

## 2015-04-08 ENCOUNTER — Emergency Department (HOSPITAL_COMMUNITY)
Admission: EM | Admit: 2015-04-08 | Discharge: 2015-04-09 | Disposition: A | Discharge status: Other Acute Inpatient Hospital | Payer: Managed Care, Other (non HMO) | Attending: Emergency Medicine | Admitting: Emergency Medicine

## 2015-04-08 ENCOUNTER — Emergency Department (HOSPITAL_COMMUNITY): Payer: Managed Care, Other (non HMO)

## 2015-04-08 ENCOUNTER — Encounter (HOSPITAL_COMMUNITY): Payer: Self-pay | Admitting: Emergency Medicine

## 2015-04-08 DIAGNOSIS — Z3202 Encounter for pregnancy test, result negative: Secondary | ICD-10-CM | POA: Diagnosis not present

## 2015-04-08 DIAGNOSIS — T50902A Poisoning by unspecified drugs, medicaments and biological substances, intentional self-harm, initial encounter: Secondary | ICD-10-CM

## 2015-04-08 DIAGNOSIS — Z8679 Personal history of other diseases of the circulatory system: Secondary | ICD-10-CM | POA: Diagnosis not present

## 2015-04-08 DIAGNOSIS — T426X2A Poisoning by other antiepileptic and sedative-hypnotic drugs, intentional self-harm, initial encounter: Secondary | ICD-10-CM | POA: Insufficient documentation

## 2015-04-08 DIAGNOSIS — Z720 Tobacco use: Secondary | ICD-10-CM | POA: Insufficient documentation

## 2015-04-08 DIAGNOSIS — Z87410 Personal history of cervical dysplasia: Secondary | ICD-10-CM | POA: Diagnosis not present

## 2015-04-08 DIAGNOSIS — F319 Bipolar disorder, unspecified: Secondary | ICD-10-CM | POA: Insufficient documentation

## 2015-04-08 DIAGNOSIS — Z79899 Other long term (current) drug therapy: Secondary | ICD-10-CM | POA: Insufficient documentation

## 2015-04-08 DIAGNOSIS — R45851 Suicidal ideations: Secondary | ICD-10-CM

## 2015-04-08 DIAGNOSIS — F419 Anxiety disorder, unspecified: Secondary | ICD-10-CM | POA: Diagnosis not present

## 2015-04-08 DIAGNOSIS — Z8669 Personal history of other diseases of the nervous system and sense organs: Secondary | ICD-10-CM | POA: Insufficient documentation

## 2015-04-08 DIAGNOSIS — F1110 Opioid abuse, uncomplicated: Secondary | ICD-10-CM | POA: Diagnosis not present

## 2015-04-08 DIAGNOSIS — Z87442 Personal history of urinary calculi: Secondary | ICD-10-CM | POA: Insufficient documentation

## 2015-04-08 LAB — COMPREHENSIVE METABOLIC PANEL
ALBUMIN: 3.9 g/dL (ref 3.5–5.0)
ALT: 30 U/L (ref 14–54)
AST: 30 U/L (ref 15–41)
Alkaline Phosphatase: 67 U/L (ref 38–126)
Anion gap: 9 (ref 5–15)
BUN: 8 mg/dL (ref 6–20)
CO2: 27 mmol/L (ref 22–32)
Calcium: 9.3 mg/dL (ref 8.9–10.3)
Chloride: 105 mmol/L (ref 101–111)
Creatinine, Ser: 0.63 mg/dL (ref 0.44–1.00)
GFR calc Af Amer: >60 mL/min (ref >60)
GFR calc non Af Amer: >60 mL/min (ref >60)
GLUCOSE: 136 mg/dL — AB (ref 65–99)
POTASSIUM: 3.9 mmol/L (ref 3.5–5.1)
SODIUM: 141 mmol/L (ref 135–145)
TOTAL PROTEIN: 8 g/dL (ref 6.5–8.1)
Total Bilirubin: 0.9 mg/dL (ref 0.3–1.2)

## 2015-04-08 LAB — RAPID URINE DRUG SCREEN, HOSP PERFORMED
Amphetamines: NOT DETECTED
Barbiturates: NOT DETECTED
Benzodiazepines: NOT DETECTED
COCAINE: NOT DETECTED
OPIATES: POSITIVE — AB
TETRAHYDROCANNABINOL: NOT DETECTED

## 2015-04-08 LAB — URINE MICROSCOPIC-ADD ON

## 2015-04-08 LAB — CBC WITH DIFFERENTIAL/PLATELET
BASOS PCT: 0 % (ref 0–1)
Basophils Absolute: 0 10*3/uL (ref 0.0–0.1)
EOS ABS: 0 10*3/uL (ref 0.0–0.7)
EOS PCT: 1 % (ref 0–5)
HCT: 40.4 % (ref 36.0–46.0)
Hemoglobin: 13.3 g/dL (ref 12.0–15.0)
Lymphocytes Relative: 32 % (ref 12–46)
Lymphs Abs: 2.1 10*3/uL (ref 0.7–4.0)
MCH: 28.5 pg (ref 26.0–34.0)
MCHC: 32.9 g/dL (ref 30.0–36.0)
MCV: 86.7 fL (ref 78.0–100.0)
MONOS PCT: 6 % (ref 3–12)
Monocytes Absolute: 0.4 10*3/uL (ref 0.1–1.0)
Neutro Abs: 4.1 10*3/uL (ref 1.7–7.7)
Neutrophils Relative %: 61 % (ref 43–77)
Platelets: 222 10*3/uL (ref 150–400)
RBC: 4.66 MIL/uL (ref 3.87–5.11)
RDW: 12.8 % (ref 11.5–15.5)
WBC: 6.6 10*3/uL (ref 4.0–10.5)

## 2015-04-08 LAB — PREGNANCY, URINE: Preg Test, Ur: NEGATIVE

## 2015-04-08 LAB — URINALYSIS, ROUTINE W REFLEX MICROSCOPIC
Bilirubin Urine: NEGATIVE
Glucose, UA: NEGATIVE mg/dL
Ketones, ur: NEGATIVE mg/dL
LEUKOCYTES UA: NEGATIVE
NITRITE: NEGATIVE
PROTEIN: NEGATIVE mg/dL
Specific Gravity, Urine: 1.02 (ref 1.005–1.030)
Urobilinogen, UA: 0.2 mg/dL (ref 0.0–1.0)
pH: 8.5 — ABNORMAL HIGH (ref 5.0–8.0)

## 2015-04-08 LAB — SALICYLATE LEVEL

## 2015-04-08 LAB — ACETAMINOPHEN LEVEL: Acetaminophen (Tylenol), Serum: <10 ug/mL — ABNORMAL LOW (ref 10–30)

## 2015-04-08 LAB — ETHANOL: Alcohol, Ethyl (B): <5 mg/dL (ref <5)

## 2015-04-08 IMAGING — CT CT HEAD W/O CM
1 of 2 series · 16 of 30 positions shown, 20 images · non-contrast
Comparison: [DATE]

CLINICAL DATA: Fall, with legs giving out immediately prior to
arrival. Hit right forehead. Pain. Initial encounter.

EXAM:
CT HEAD WITHOUT CONTRAST
TECHNIQUE: Contiguous axial images were obtained from the base of the skull
through the vertex without intravenous contrast.

[Series 2: headtrauma 4.8 h37s · axial · 0.47mm/px · z∈[+87,+241]mm · 16 of 36 slices shown, 20 images]
[im 2/36  brain]
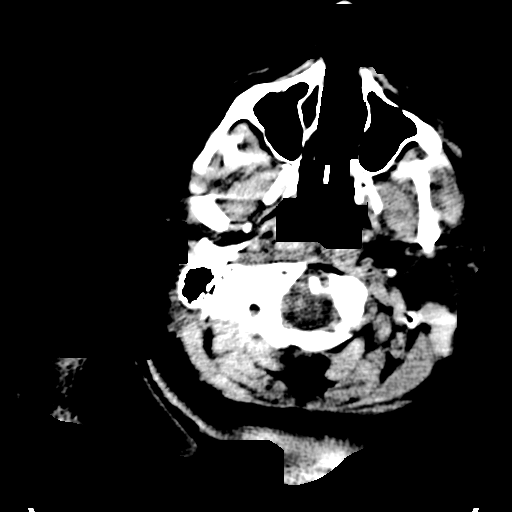
[im 2/36  bone]
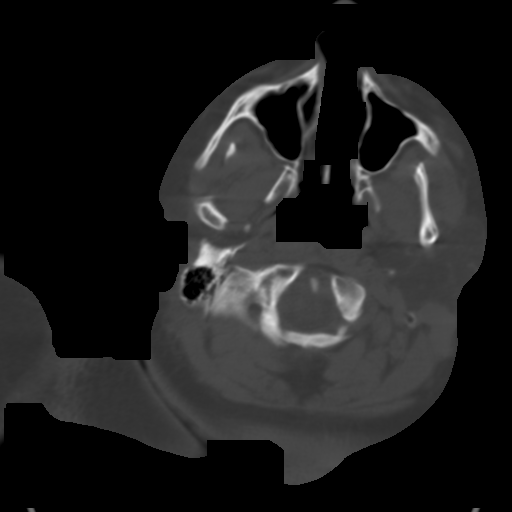
[im 4/36  brain]
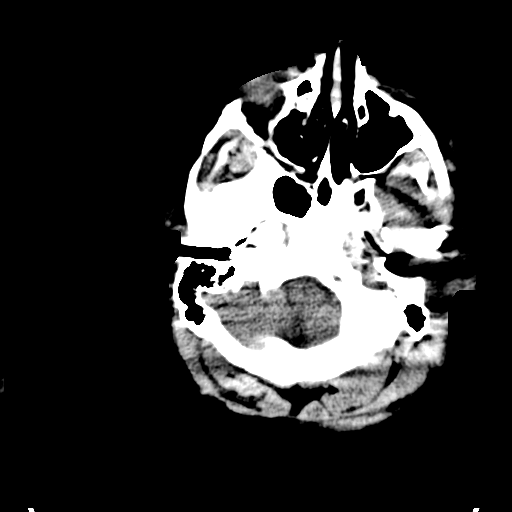
[im 7/36  brain]
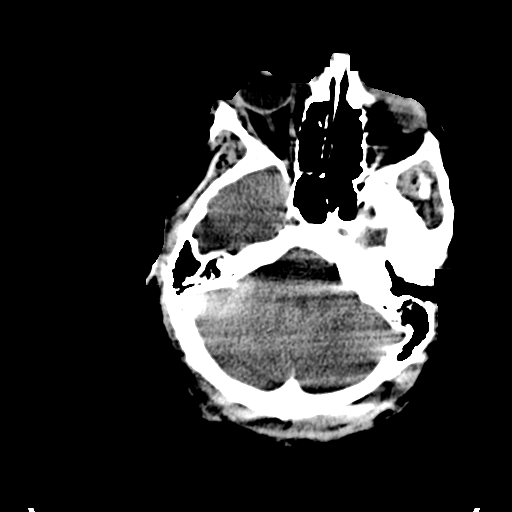
[im 9/36  brain]
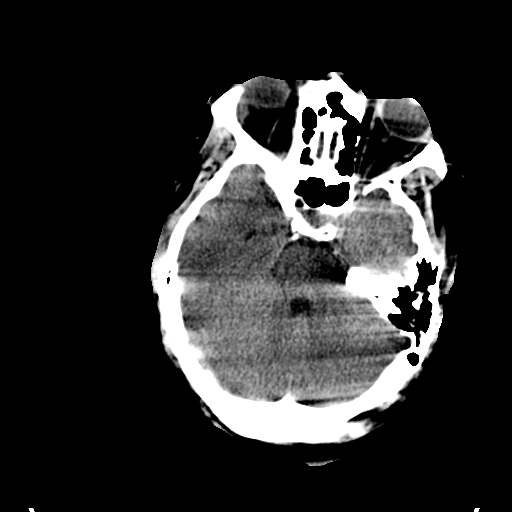
[im 11/36  brain]
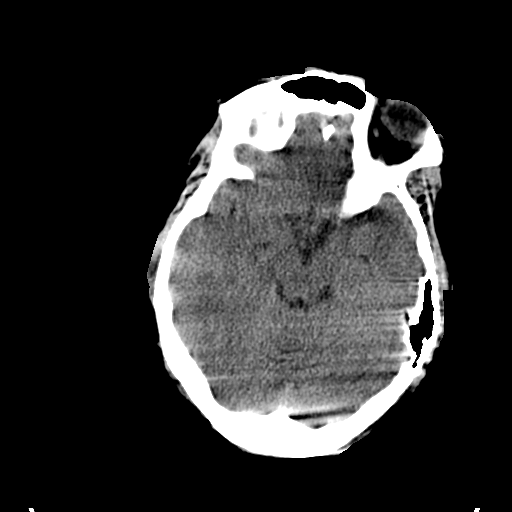
[im 11/36  bone]
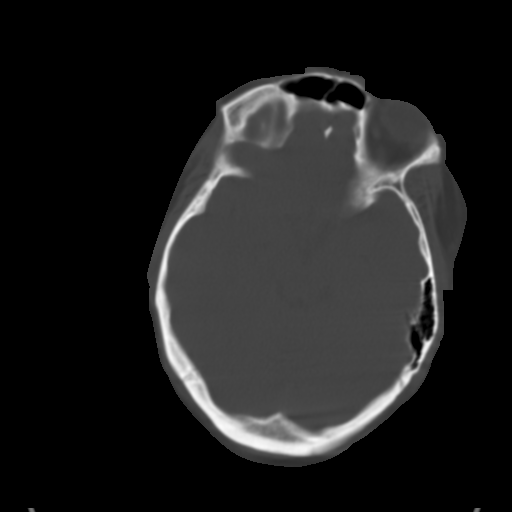
[im 12/36  brain]
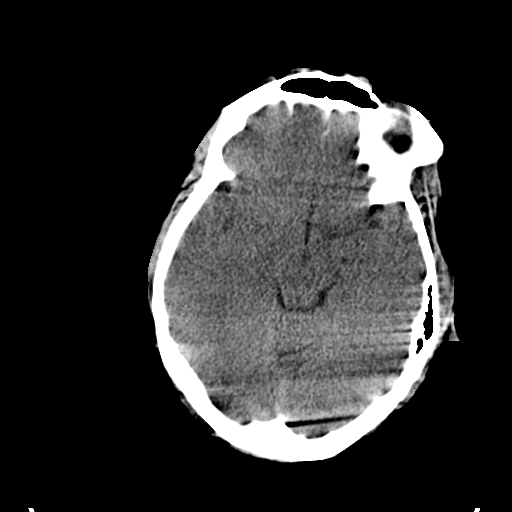
[im 16/36  brain]
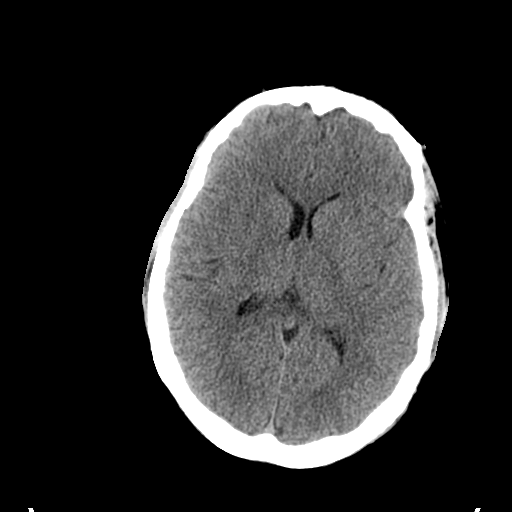
[im 17/36  brain]
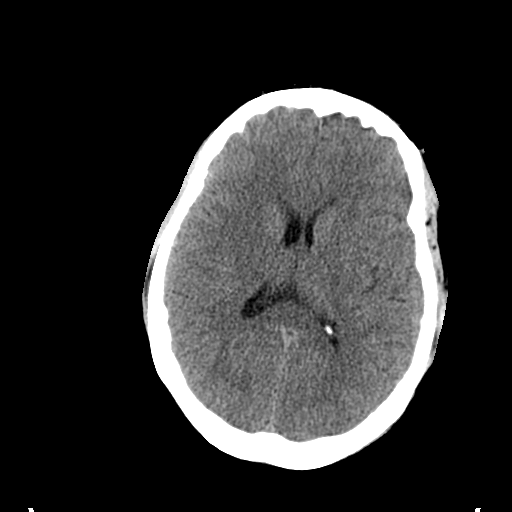
[im 19/36  brain]
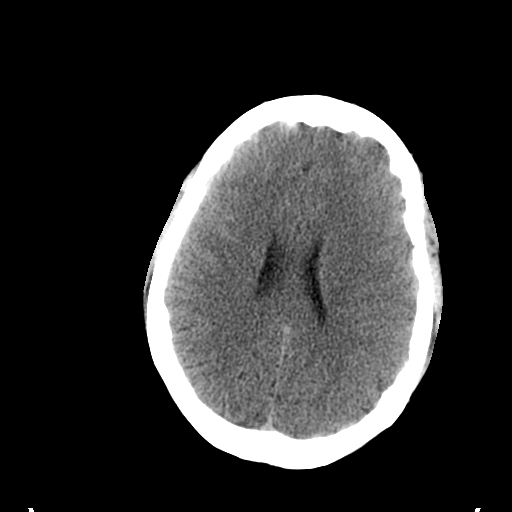
[im 19/36  bone]
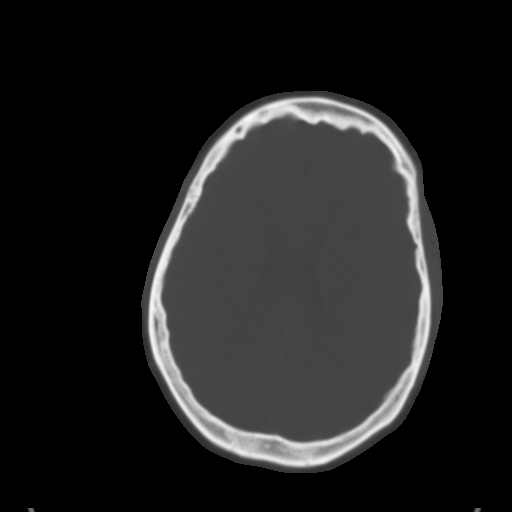
[im 21/36  brain]
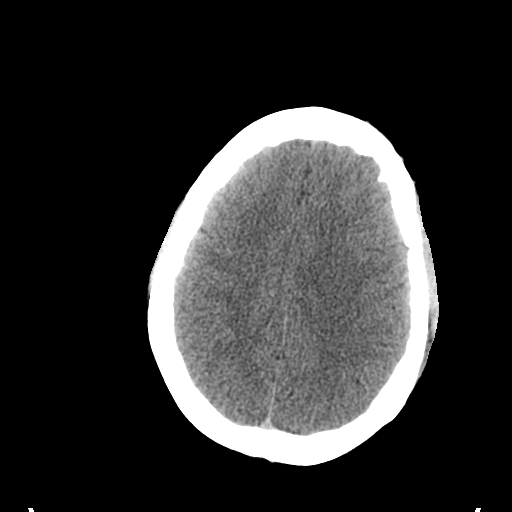
[im 24/36  brain]
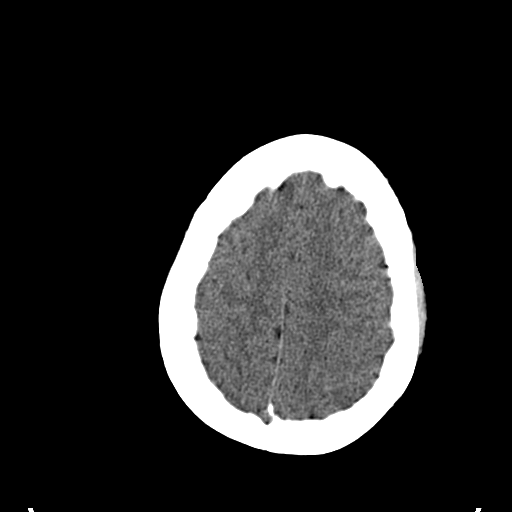
[im 26/36  brain]
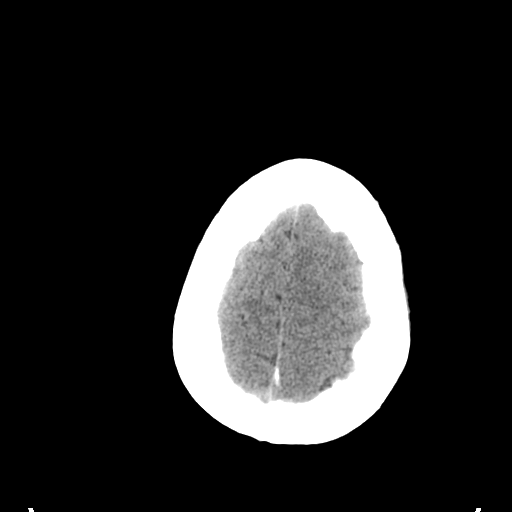
[im 27/36  brain]
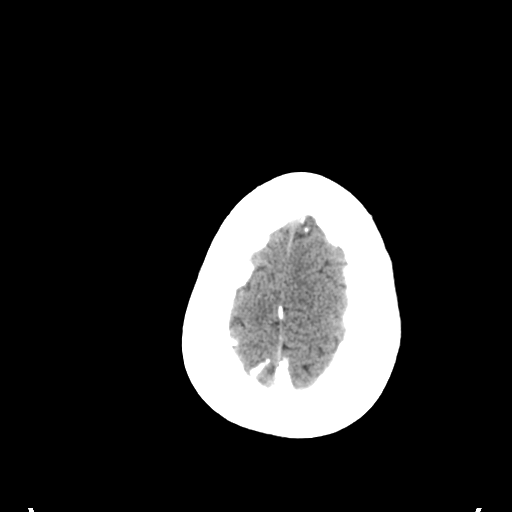
[im 27/36  bone]
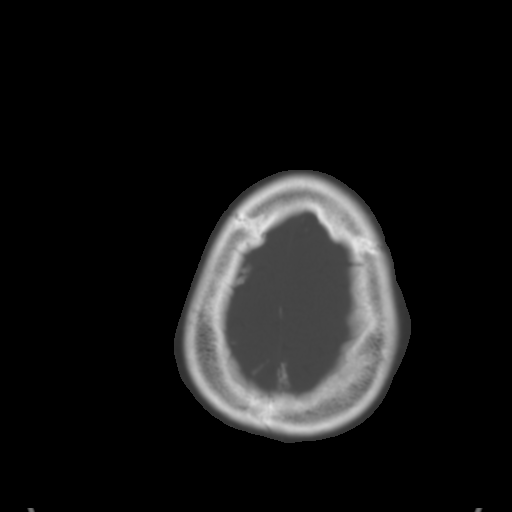
[im 29/36  brain]
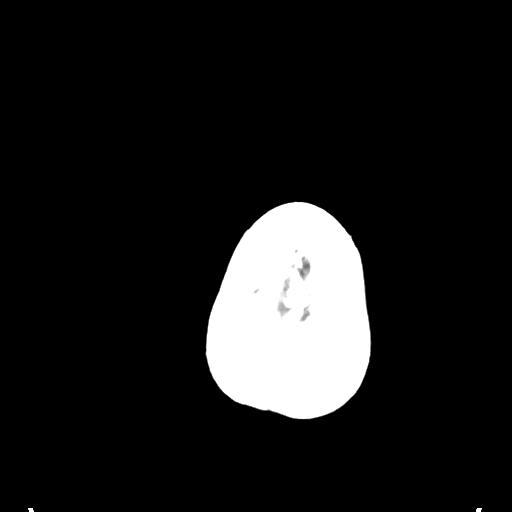
[im 32/36  brain]
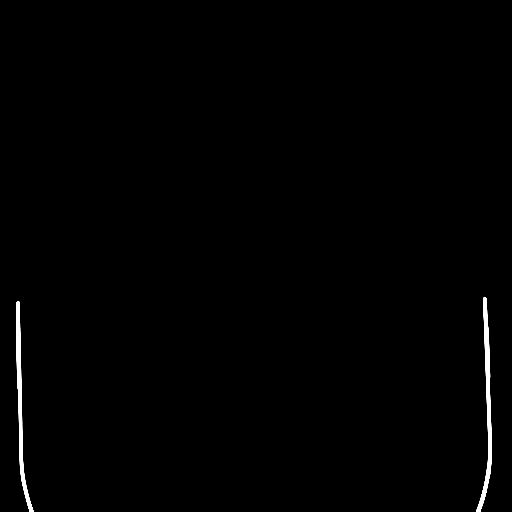
[im 34/36  brain]
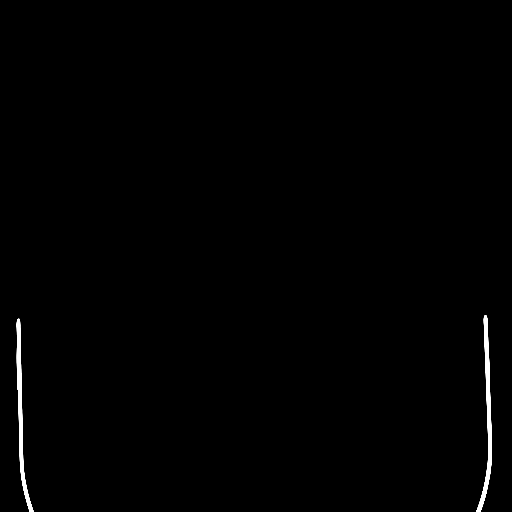

[16 of 30 positions shown; findings below may reference images not displayed]

FINDINGS: There is no evidence of acute cortical infarct, intracranial
hemorrhage, mass, midline shift, or extra-axial fluid collection.
Ventricles and sulci are normal.

Orbits are unremarkable. There is partial opacification of a
posterior right ethmoid air cell. Visualized mastoid air cells are
clear. No skull fracture is identified.
IMPRESSION: No evidence of acute intracranial abnormality. Unremarkable CT
appearance of the brain.

## 2015-04-08 MED ORDER — SODIUM CHLORIDE 0.9 % IV BOLUS (SEPSIS)
1000.0000 mL | Freq: Once | INTRAVENOUS | Status: AC
  Administered 2015-04-08: 1000 mL via INTRAVENOUS

## 2015-04-08 MED ORDER — ACETAMINOPHEN 325 MG PO TABS
650.0000 mg | ORAL_TABLET | Freq: Once | ORAL | Status: AC
  Administered 2015-04-08: 650 mg via ORAL
  Filled 2015-04-08: qty 2

## 2015-04-08 MED ORDER — SODIUM CHLORIDE 0.9 % IV SOLN
Freq: Once | INTRAVENOUS | Status: AC
  Administered 2015-04-08: 14:00:00 via INTRAVENOUS

## 2015-04-08 NOTE — ED Notes (Signed)
Pt reports taking 25-30 ambien  tablets since last night. Pt states she took the last 8 tablets x 1 hour ago. When asked if she was trying to harm herself, pt states "i was just trying to rest". Pt states she is "just trying to deal with life and past stuff and flashbacks and trying to get out of my own head". Pt alert and sleepy. VSS.

## 2015-04-08 NOTE — ED Notes (Signed)
Latera Mclin (pt's boyfriend) [phone]  Pt's Mom [phone]
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
## 2015-04-08 NOTE — ED Provider Notes (Addendum)
CSN: [id number]     Arrival date & time 04/08/15  1228 History  This chart was scribed for Rolland Porter, MD by Octavia Heir, ED Scribe. This patient was seen in room [facility] and the patient's care was started at 12:54 PM.    Chief Complaint  Patient presents with  . Drug Overdose      The history is provided by the patient. No language interpreter was used.   HPI Comments: Nancy [name] is a 29 y.o. female who presents to the Emergency Department for a drug overdose. Pt notes taking about 25-30 ambien 10 mg tablets tablets since last night. She notes it was after midnight when she took 43 ambien because she was "tired, sleepy, and wanted to sleep a little longer." She states she thought she could die from taking so many pills but she did not care. She reports she tossed the pills into her hands and did not count how many she took. Pt is currently on Topamax, Lamictal and Risperdal for her bipolar disorder.  Past Medical History  Diagnosis Date  . Bipolar disorder   . Tachycardia   . CIN I (cervical intraepithelial neoplasia I)   . OSA (obstructive sleep apnea)   . Anxiety   . Nephrolithiasis   . Enlarged heart    Past Surgical History  Procedure Laterality Date  . Bladder augmentation  2001    enlargement  . Dilitation & currettage/hystroscopy with thermachoice ablation N/A 07/06/2013    Procedure: DILATATION & CURETTAGE/HYSTEROSCOPY WITH THERMACHOICE ABLATION;  Surgeon: Tilda Burrow, MD;  Location: [facility];  Service: Gynecology;  Laterality: N/A;  total therapt time- 9 minutes 2 seconds; 87C  . Laparoscopic bilateral salpingectomy Bilateral 07/06/2013    Procedure: LAPAROSCOPIC BILATERAL SALPINGECTOMY;  Surgeon: Tilda Burrow, MD;  Location: [facility];  Service: Gynecology;  Laterality: Bilateral;   Family History  Problem Relation Age of Onset  . Hypertension Mother   . Depression Mother   . Diabetes Mother   . Cancer Mother     brain tumor  . Hypertension Father   .  Diabetes Father   . Depression Father   . Hypertension Sister   . Diabetes Sister   . Depression Sister   . Depression Brother   . Diabetes Brother   . Hypertension Brother   . Cancer Maternal Aunt   . Cancer Paternal Aunt   . Hypertension Maternal Grandmother   . Diabetes Maternal Grandmother   . Depression Maternal Grandmother   . Hypertension Maternal Grandfather   . Diabetes Maternal Grandfather   . Depression Maternal Grandfather   . Hypertension Paternal Grandmother   . Diabetes Paternal Grandmother   . Depression Paternal Grandmother   . Hypertension Paternal Grandfather   . Diabetes Paternal Grandfather   . Depression Paternal Grandfather    Social History  Substance Use Topics  . Smoking status: Current Every Day Smoker -- 0.50 packs/day    Types: Cigarettes  . Smokeless tobacco: Never Used  . Alcohol Use: Yes     Comment: occasionally   OB History    No data available     Review of Systems  Constitutional: Negative for fever, chills, diaphoresis, appetite change and fatigue.  HENT: Negative for mouth sores, sore throat and trouble swallowing.   Eyes: Negative for visual disturbance.  Respiratory: Negative for cough, chest tightness, shortness of breath and wheezing.   Cardiovascular: Negative for chest pain.  Gastrointestinal: Negative for nausea, vomiting, abdominal pain, diarrhea and abdominal distention.  Endocrine: Negative for polydipsia, polyphagia and polyuria.  Genitourinary: Negative for dysuria, frequency and hematuria.  Musculoskeletal: Negative for gait problem.  Skin: Negative for color change, pallor and rash.  Neurological: Negative for dizziness, syncope, light-headedness and headaches.  Hematological: Does not bruise/bleed easily.  Psychiatric/Behavioral: Negative for behavioral problems and confusion.      Allergies  Asa buff (mag; Aspirin; and Tolmetin  Home Medications   Prior to Admission medications   Medication Sig Start Date  End Date Taking? Authorizing Provider  gabapentin (NEURONTIN) 100 MG capsule Take 1 capsule (100 mg total) by mouth 3 (three) times daily. 01/02/15  Yes Vickki Hearing, MD  lamoTRIgine (LAMICTAL) 25 MG tablet Take 50 mg by mouth daily.   Yes Historical Provider, MD  metFORMIN (GLUCOPHAGE) 500 MG tablet Take 500 mg by mouth 2 (two) times daily with a meal.   Yes Historical Provider, MD  risperiDONE (RISPERDAL) 0.5 MG tablet Take 0.5 mg by mouth at bedtime.   Yes Historical Provider, MD  topiramate (TOPAMAX) 50 MG tablet Take 50 mg by mouth 2 (two) times daily.   Yes Historical Provider, MD  zolpidem (AMBIEN) 10 MG tablet Take 10 mg by mouth at bedtime as needed for sleep.   Yes Historical Provider, MD   Triage vitals: BP 149/84 mmHg  Pulse 135  Temp(Src) 99.4 F (37.4 C)  Resp 18  Ht 5\' 7"  (1.702 m)  Wt 370 lb (167.831 kg)  BMI 57.94 kg/m2  SpO2 100% Physical Exam  Constitutional: She is oriented to person, place, and time. She appears well-developed and well-nourished. No distress.  Keeps eyes closed, sleepy but appropriate  HENT:  Head: Normocephalic.  Eyes: Conjunctivae are normal. Pupils are equal, round, and reactive to light. No scleral icterus.  Neck: Normal range of motion. Neck supple. No thyromegaly present.  Cardiovascular: Normal rate and regular rhythm.  Exam reveals no gallop and no friction rub.   No murmur heard. Pulmonary/Chest: Effort normal and breath sounds normal. No respiratory distress. She has no wheezes. She has no rales.  Abdominal: Soft. Bowel sounds are normal. She exhibits no distension. There is no tenderness. There is no rebound.  Musculoskeletal: Normal range of motion.  Neurological: She is alert and oriented to person, place, and time.  Skin: Skin is warm and dry. No rash noted.  Psychiatric: She has a normal mood and affect. Her behavior is normal.  Nursing note and vitals reviewed.   ED Course  Procedures  DIAGNOSTIC STUDIES: Oxygen  Saturation is 100% on RA, normal by my interpretation.  COORDINATION OF CARE:  12:58 PM Discussed treatment plan which includes psychiatric evaluation and lab work with pt at bedside and pt agreed to plan.  Labs Review Labs Reviewed  COMPREHENSIVE METABOLIC PANEL - Abnormal; Notable for the following:    Glucose, Bld 136 (*)    All other components within normal limits  ACETAMINOPHEN LEVEL - Abnormal; Notable for the following:    Acetaminophen (Tylenol), Serum <10 (*)    All other components within normal limits  URINE RAPID DRUG SCREEN, HOSP PERFORMED - Abnormal; Notable for the following:    Opiates POSITIVE (*)    All other components within normal limits  URINALYSIS, ROUTINE W REFLEX MICROSCOPIC (NOT AT [facility]) - Abnormal; Notable for the following:    APPearance CLOUDY (*)    pH 8.5 (*)    Hgb urine dipstick SMALL (*)    All other components within normal limits  URINE MICROSCOPIC-ADD ON - Abnormal; Notable  for the following:    Squamous Epithelial / LPF MANY (*)    Bacteria, UA MANY (*)    All other components within normal limits  CBC WITH DIFFERENTIAL/PLATELET  SALICYLATE LEVEL  ETHANOL  PREGNANCY, URINE    Imaging Review No results found. I have personally reviewed and evaluated these images and lab results as part of my medical decision-making.   EKG Interpretation   Date/Time:  Saturday April 08 2015 12:51:24 EDT Ventricular Rate:  125 PR Interval:  73 QRS Duration: 86 QT Interval:  325 QTC Calculation: 469 R Axis:   68 Text Interpretation:  Sinus tachycardia Confirmed by Fayrene Fearing  MD, [place] [facility]  [phone]) on 04/08/2015 1:03:51 PM      MDM   Final diagnoses:  Overdose, intentional self-harm, initial encounter  Suicidal ideation   Pt metabolic eval is reassuring.   Remains somnolent. However well oxygenated. Protecting airway. Awakens to voice. It has shown some improvement in her mental status. Regarding the shear volume of the Ambien she took plan an  additional few hours of observation and reevaluation prior to transfer. Has been accepted for inpatient treatment at behavioral health hospital.  Patient is consistently more and more awake. Was in the restroom. Was escorted to the restroom. States that she fell from the toilet against a wall. Has no apparent injuries but complains of headache. Reexamined. No focal neurological deficits. Full range of motion of the shoulder extremities without soft tissue swelling or bruising. CT scan of the head shows no acute abnormalities. Patient has been accepted behavioral health. She is more awake now and ambulatory and appropriate for transfer for inpatient treatment.   Rolland Porter, MD 04/08/15 1906  Rolland Porter, MD 04/08/15 2116

## 2015-04-08 NOTE — ED Notes (Signed)
Updated report given to Liz,RN at [facility].

## 2015-04-09 ENCOUNTER — Encounter (HOSPITAL_COMMUNITY): Payer: Self-pay

## 2015-04-09 ENCOUNTER — Inpatient Hospital Stay (HOSPITAL_COMMUNITY)
Admission: AD | Admit: 2015-04-09 | Discharge: 2015-04-17 | DRG: 885 | Disposition: A | Discharge status: Home / Self Care | Payer: Managed Care, Other (non HMO) | Source: Intra-hospital | Attending: Psychiatry | Admitting: Psychiatry

## 2015-04-09 DIAGNOSIS — F3130 Bipolar disorder, current episode depressed, mild or moderate severity, unspecified: Secondary | ICD-10-CM | POA: Diagnosis present

## 2015-04-09 DIAGNOSIS — F1110 Opioid abuse, uncomplicated: Secondary | ICD-10-CM | POA: Diagnosis present

## 2015-04-09 DIAGNOSIS — R45851 Suicidal ideations: Secondary | ICD-10-CM | POA: Diagnosis present

## 2015-04-09 DIAGNOSIS — G4733 Obstructive sleep apnea (adult) (pediatric): Secondary | ICD-10-CM | POA: Diagnosis present

## 2015-04-09 DIAGNOSIS — F13188 Sedative, hypnotic or anxiolytic abuse with other sedative, hypnotic or anxiolytic-induced disorder: Secondary | ICD-10-CM | POA: Diagnosis present

## 2015-04-09 DIAGNOSIS — F332 Major depressive disorder, recurrent severe without psychotic features: Secondary | ICD-10-CM | POA: Diagnosis present

## 2015-04-09 DIAGNOSIS — Z6841 Body Mass Index (BMI) 40.0 and over, adult: Secondary | ICD-10-CM

## 2015-04-09 DIAGNOSIS — F319 Bipolar disorder, unspecified: Secondary | ICD-10-CM | POA: Diagnosis present

## 2015-04-09 DIAGNOSIS — F17210 Nicotine dependence, cigarettes, uncomplicated: Secondary | ICD-10-CM | POA: Diagnosis present

## 2015-04-09 DIAGNOSIS — T43592A Poisoning by other antipsychotics and neuroleptics, intentional self-harm, initial encounter: Secondary | ICD-10-CM | POA: Diagnosis present

## 2015-04-09 LAB — GLUCOSE, CAPILLARY
GLUCOSE-CAPILLARY: 135 mg/dL — AB (ref 65–99)
Glucose-Capillary: 132 mg/dL — ABNORMAL HIGH (ref 65–99)
Glucose-Capillary: 137 mg/dL — ABNORMAL HIGH (ref 65–99)
Glucose-Capillary: 158 mg/dL — ABNORMAL HIGH (ref 65–99)

## 2015-04-09 MED ORDER — CLONIDINE HCL 0.1 MG PO TABS
0.1000 mg | ORAL_TABLET | Freq: Four times a day (QID) | ORAL | Status: AC
  Administered 2015-04-09 – 2015-04-12 (×10): 0.1 mg via ORAL
  Filled 2015-04-09 (×11): qty 1

## 2015-04-09 MED ORDER — TOPIRAMATE 25 MG PO TABS
50.0000 mg | ORAL_TABLET | Freq: Two times a day (BID) | ORAL | Status: DC
  Administered 2015-04-09 – 2015-04-12 (×7): 50 mg via ORAL
  Filled 2015-04-09 (×11): qty 2

## 2015-04-09 MED ORDER — METHOCARBAMOL 500 MG PO TABS
500.0000 mg | ORAL_TABLET | Freq: Three times a day (TID) | ORAL | Status: AC | PRN
  Administered 2015-04-11 – 2015-04-14 (×3): 500 mg via ORAL
  Filled 2015-04-09 (×3): qty 1

## 2015-04-09 MED ORDER — GABAPENTIN 100 MG PO CAPS
100.0000 mg | ORAL_CAPSULE | Freq: Three times a day (TID) | ORAL | Status: DC
  Administered 2015-04-09 – 2015-04-10 (×4): 100 mg via ORAL
  Filled 2015-04-09 (×7): qty 1

## 2015-04-09 MED ORDER — HYDROXYZINE HCL 25 MG PO TABS: 25.0000 mg | ORAL_TABLET | Freq: Four times a day (QID) | ORAL | Status: DC | PRN

## 2015-04-09 MED ORDER — CLONIDINE HCL 0.1 MG PO TABS
0.1000 mg | ORAL_TABLET | Freq: Every day | ORAL | Status: AC
  Administered 2015-04-15 – 2015-04-16 (×2): 0.1 mg via ORAL
  Filled 2015-04-09 (×2): qty 1

## 2015-04-09 MED ORDER — ONDANSETRON 4 MG PO TBDP: 4.0000 mg | ORAL_TABLET | Freq: Four times a day (QID) | ORAL | Status: AC | PRN

## 2015-04-09 MED ORDER — NICOTINE 21 MG/24HR TD PT24
21.0000 mg | MEDICATED_PATCH | Freq: Every day | TRANSDERMAL | Status: DC
  Administered 2015-04-09 – 2015-04-17 (×9): 21 mg via TRANSDERMAL
  Filled 2015-04-09 (×11): qty 1

## 2015-04-09 MED ORDER — RISPERIDONE 0.5 MG PO TABS
0.5000 mg | ORAL_TABLET | Freq: Every day | ORAL | Status: DC
  Filled 2015-04-09 (×2): qty 1

## 2015-04-09 MED ORDER — LOPERAMIDE HCL 2 MG PO CAPS: 2.0000 mg | ORAL_CAPSULE | ORAL | Status: AC | PRN

## 2015-04-09 MED ORDER — ACETAMINOPHEN 325 MG PO TABS
650.0000 mg | ORAL_TABLET | Freq: Four times a day (QID) | ORAL | Status: DC | PRN
  Administered 2015-04-10 – 2015-04-16 (×5): 650 mg via ORAL
  Filled 2015-04-09 (×5): qty 2

## 2015-04-09 MED ORDER — RISPERIDONE 1 MG PO TABS
1.0000 mg | ORAL_TABLET | Freq: Every day | ORAL | Status: DC
  Filled 2015-04-09: qty 1

## 2015-04-09 MED ORDER — LAMOTRIGINE 25 MG PO TABS
50.0000 mg | ORAL_TABLET | Freq: Every day | ORAL | Status: DC
  Administered 2015-04-09 – 2015-04-14 (×6): 50 mg via ORAL
  Filled 2015-04-09 (×9): qty 2

## 2015-04-09 MED ORDER — DICYCLOMINE HCL 20 MG PO TABS: 20.0000 mg | ORAL_TABLET | Freq: Four times a day (QID) | ORAL | Status: AC | PRN

## 2015-04-09 MED ORDER — HYDROXYZINE HCL 50 MG PO TABS
50.0000 mg | ORAL_TABLET | Freq: Every evening | ORAL | Status: DC | PRN
  Administered 2015-04-09: 50 mg via ORAL
  Filled 2015-04-09: qty 1

## 2015-04-09 MED ORDER — METFORMIN HCL 500 MG PO TABS
500.0000 mg | ORAL_TABLET | Freq: Two times a day (BID) | ORAL | Status: DC
  Administered 2015-04-09: 500 mg via ORAL
  Filled 2015-04-09 (×7): qty 1

## 2015-04-09 MED ORDER — CLONIDINE HCL 0.1 MG PO TABS
0.1000 mg | ORAL_TABLET | ORAL | Status: AC
  Administered 2015-04-12 – 2015-04-14 (×3): 0.1 mg via ORAL
  Filled 2015-04-09 (×4): qty 1

## 2015-04-09 MED ORDER — RISPERIDONE 2 MG PO TABS
2.0000 mg | ORAL_TABLET | Freq: Every day | ORAL | Status: DC
  Administered 2015-04-09: 2 mg via ORAL
  Filled 2015-04-09 (×4): qty 1

## 2015-04-09 NOTE — [facility] Group Notes (Signed)
[facility] Group Notes: (Clinical Social Work)   04/09/2015      Type of Therapy:  Group Therapy   Participation Level:  Did Not Attend despite MHT prompting   Ambrose Mantle, LCSW 04/09/2015, 3:53 PM

## 2015-04-09 NOTE — Progress Notes (Signed)
Adult Psychoeducational Group Note  Date:  04/09/2015 Time:  10:09 PM  Group Topic/Focus:  Wrap-Up Group:   The focus of this group is to help patients review their daily goal of treatment and discuss progress on daily workbooks.  Participation Level:  Active  Participation Quality:  Appropriate  Affect:  Appropriate  Cognitive:  Appropriate  Insight: Appropriate  Engagement in Group:  Engaged  Modes of Intervention:  Discussion  Additional Comments: The patient expressed that she attended group.  Octavio Manns 04/09/2015, 10:09 PM

## 2015-04-09 NOTE — [facility] Group Notes (Signed)

## 2015-04-09 NOTE — Tx Team (Signed)
Initial Interdisciplinary Treatment Plan   PATIENT STRESSORS: Financial difficulties Medication change or noncompliance   PATIENT STRENGTHS: Ability for insight Average or above average intelligence Capable of independent living General fund of knowledge   PROBLEM LIST: Problem List/Patient Goals Date to be addressed Date deferred Reason deferred Estimated date of resolution  depression 04/09/2015     anxiety 04/09/2015     Suicidal attempt 04/09/2015     "I need a fresh start" 04/09/2015                                    DISCHARGE CRITERIA:  Ability to meet basic life and health needs Improved stabilization in mood, thinking, and/or behavior Motivation to continue treatment in a less acute level of care Verbal commitment to aftercare and medication compliance  PRELIMINARY DISCHARGE PLAN: Outpatient therapy Participate in family therapy Return to previous living arrangement  PATIENT/FAMIILY INVOLVEMENT: This treatment plan has been presented to and reviewed with the patient, Nancy [name]  The patient and family have been given the opportunity to ask questions and make suggestions.  JEHU-APPIAH, Paradise Vensel K 04/09/2015, 2:28 AM

## 2015-04-09 NOTE — Progress Notes (Signed)
D:  Patient's self inventory sheet, patient has poor sleep, no sleep medication given.   Poor appetite, low energy level, poor concentration.  Rated depression, hopeless and anxiety 10.  Denied withdrawals.  SI thoughts sometimes.  Physical problems, lightheaded, dizzy, pain, headaches, blurred vision.  Worst pain in past 24 hours #8, head, arm.  Pain medication is helpful.  Goal is to work on depression and suicide thoughts.  Plans to stay alive.  No discharge plans. A:  Medications administered per MD orders.  Emotional support and encouragement given patient. R:  Denied SI and HI while talking to nurse this morning, contracts for safety.  Denied A/V hallucinations.  Denied pain.  Ambulates with wheelchair.  Patient advised to stay in wheelchair because of unsteadiness.  Patient stated she would like to go to another hall and play piano to relieve stress.
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
## 2015-04-09 NOTE — [facility] Suicide Risk Assessment (Signed)
[facility] Admission Suicide Risk Assessment   Nursing information obtained from:  Patient Demographic factors:  Living alone Current Mental Status:  Suicidal ideation indicated by patient, Plan includes specific time, place, or method, Self-harm behaviors, Intention to act on suicide plan, Belief that plan would result in death Loss Factors:  Financial problems / change in socioeconomic status Historical Factors:  Prior suicide attempts, Family history of suicide, Family history of mental illness or substance abuse Risk Reduction Factors:  Positive social support Total Time spent with patient: 45 minutes Principal Problem: <principal problem not specified> Diagnosis:   Patient Active Problem List   Diagnosis Date Noted  . Opiate abuse, episodic [F11.10] 04/09/2015  . MDD (major depressive disorder), recurrent episode, severe [F33.2] 04/09/2015  . Suicide attempt by other tranquilizer drug overdose [T43.592A] 04/09/2015  . 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 Dysmenorrhea [N94.6] 07/06/2013  . Dysmenorrhea [N94.6] 06/09/2013  . Menorrhagia with regular cycle [N92.0] 06/09/2013  . Leg edema [R60.0] 10/03/2011  . Edema leg [R60.0] 10/03/2011  . Bipolar 1 disorder [F31.9] 09/26/2011  . Morbid obesity [E66.01] 09/26/2011  . Weakness of both legs [R29.898] 09/04/2011  . Difficulty in walking(719.7) [R26.2] 09/04/2011  . 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  World Science writer [facility]). Score between 0-7:  no or low risk or alcohol related problems. Score between 8-15:  moderate risk of alcohol related problems. Score between 16-19:  high risk of alcohol related problems. Score 20 or above:  warrants further diagnostic evaluation for alcohol dependence and  treatment.   CLINICAL FACTORS:   Severe Anxiety and/or Agitation Depression:   Anhedonia Hopelessness Impulsivity Insomnia Recent sense of peace/wellbeing Severe Alcohol/Substance Abuse/Dependencies Unstable or Poor Therapeutic Relationship Previous Psychiatric Diagnoses and Treatments Medical Diagnoses and Treatments/Surgeries   Musculoskeletal: Strength & Muscle Tone: decreased Gait & Station: broad based Patient leans: N/A  Psychiatric Specialty Exam: Physical Exam Full physical performed in Emergency Department. I have reviewed this assessment and concur with its findings.   ROS knee pain, anxiety, depression, denied nausea, vomiting, sob and chest pain No Fever-chills, No Headache, No changes with Vision or hearing, reports vertigo No problems swallowing food or Liquids, No Chest pain, Cough or Shortness of Breath, No Abdominal pain, No Nausea or Vommitting, Bowel movements are regular, No Blood in stool or Urine, No dysuria, No new skin rashes or bruises, No new joints pains-aches,  No new weakness, tingling, numbness in any extremity, No recent weight gain or loss, No polyuria, polydypsia or polyphagia,   A full 10 point Review of Systems was done, except as stated above, all other Review of Systems were negative.  Blood pressure 145/87, pulse 106, temperature 98.6 F (37 C), temperature source Oral, resp. rate 18, height 5\' 7"  (1.702 m), weight 167.831 kg (370 lb).Body mass index is 57.94 kg/(m^2).  General Appearance: Guarded  Eye Contact::  Fair  Speech:  Clear and Coherent and Slow  Volume:  Decreased  Mood:  Anxious, Depressed and Worthless  Affect:  Constricted and Depressed  Thought Process:  Coherent and Goal Directed  Orientation:  Full (Time, Place, and Person)  Thought Content:  WDL  Suicidal Thoughts:  Yes.  with intent/plan  Homicidal Thoughts:  No  Memory:  Immediate;   Fair Recent;   Fair  Judgement:  Impaired  Insight:  Fair   Psychomotor Activity:  Decreased  Concentration:  Fair  Recall:  [employer] of Knowledge:Good  Language: Good  Akathisia:  Negative  Handed:  Right  AIMS (if indicated):     Assets:  Communication Skills Desire for Improvement Financial Resources/Insurance Housing Intimacy Leisure Time Resilience Social Support Talents/Skills Transportation  Sleep:  Number of Hours: 2.25  Cognition: WNL  ADL's:  Intact     COGNITIVE FEATURES THAT CONTRIBUTE TO RISK:  Closed-mindedness, Loss of executive function, Polarized thinking and Thought constriction (tunnel vision)    SUICIDE RISK:   Severe:  Frequent, intense, and enduring suicidal ideation, specific plan, no subjective intent, but some objective markers of intent (i.e., choice of lethal method), the method is accessible, some limited preparatory behavior, evidence of impaired self-control, severe dysphoria/symptomatology, multiple risk factors present, and few if any protective factors, particularly a lack of social support.  PLAN OF CARE: Admit for psych in patient for crisis stabilization, safety monitoring and medication management for increased symptoms of depression, anxiety and status post suicide attempt.   Medical Decision Making:  Review of Psycho-Social Stressors (1), Review or order clinical lab tests (1), Established Problem, Worsening (2), Review of Last Therapy Session (1), Review or order medicine tests (1), Review of Medication Regimen & Side Effects (2) and Review of New Medication or Change in Dosage (2)  I certify that inpatient services furnished can reasonably be expected to improve the patient's condition.   Baxter Gonzalez,JANARDHAHA R. 04/09/2015, 12:31 PM

## 2015-04-09 NOTE — [facility] Counselor (Signed)
CSW attempted to do Psychosocial Assessment with pt, but she was sleeping and per MHT had been sleeping and sick all day.  Ambrose Mantle, LCSW 04/09/2015, 4:32 PM
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
## 2015-04-10 DIAGNOSIS — F3130 Bipolar disorder, current episode depressed, mild or moderate severity, unspecified: Principal | ICD-10-CM

## 2015-04-10 LAB — GLUCOSE, CAPILLARY: Glucose-Capillary: 129 mg/dL — ABNORMAL HIGH (ref 65–99)

## 2015-04-10 MED ORDER — RISPERIDONE 1 MG PO TABS
1.0000 mg | ORAL_TABLET | Freq: Every day | ORAL | Status: DC
  Filled 2015-04-10: qty 1

## 2015-04-10 MED ORDER — LORAZEPAM 0.5 MG PO TABS
0.5000 mg | ORAL_TABLET | Freq: Four times a day (QID) | ORAL | Status: DC | PRN
  Administered 2015-04-10 – 2015-04-13 (×9): 0.5 mg via ORAL
  Filled 2015-04-10 (×9): qty 1

## 2015-04-10 MED ORDER — TRAZODONE HCL 50 MG PO TABS
50.0000 mg | ORAL_TABLET | Freq: Every evening | ORAL | Status: DC | PRN
  Administered 2015-04-10 – 2015-04-15 (×5): 50 mg via ORAL
  Filled 2015-04-10 (×5): qty 1

## 2015-04-10 MED ORDER — GABAPENTIN 100 MG PO CAPS
200.0000 mg | ORAL_CAPSULE | Freq: Two times a day (BID) | ORAL | Status: DC
  Administered 2015-04-11 – 2015-04-13 (×5): 200 mg via ORAL
  Filled 2015-04-10 (×7): qty 2

## 2015-04-10 NOTE — [facility] Group Notes (Signed)
[facility] LCSW Aftercare Discharge Planning Group Note  04/10/2015 8:45 AM  Participation Quality: Alert, Appropriate and Oriented  Mood/Affect: Flat and Withdrawn  Depression Rating: 9  Anxiety Rating: 8  Thoughts of Suicide: Pt denies SI/HI  Will you contract for safety? Yes  Current AVH: Pt denies  Plan for Discharge/Comments: Pt attended discharge planning group and actively participated in group. CSW discussed suicide prevention education with the group and encouraged them to discuss discharge planning and any relevant barriers. Pt presented with low tone and rate of speech, expresses feeling badly.   Transportation Means: Pt reports access to transportation  Supports: No supports mentioned at this time  Chad Cordial, LCSWA 04/10/2015 9:44 AM

## 2015-04-10 NOTE — Progress Notes (Signed)
Patient ID: Nancy [name], female   DOB: 12/27/1985, 29 y.o.   MRN: [id number]  D: Patient pleasant on approach this am. Reports that she didn't sleep well last night but the night shift reports that she was sleeping after medications given last night and was sleeping soundly when entering room to give roommate medication. Reports mood better today and currently denies any active SI.  A: Staff will monitor on q 15 minute checks, follow treatment plan, and give medications as ordered. R: Cooperative on the unit.

## 2015-04-10 NOTE — [facility] Group Notes (Signed)
[facility] LCSW Group Therapy  04/10/2015 1:15pm  Type of Therapy:  Group Therapy vercoming Obstacles  Participation Level:  Inconsistent  Participation Quality:  Appropriate   Affect:  Lethargic  Cognitive:  Appropriate and Oriented  Insight:  Developing/Improving and Improving  Engagement in Therapy:  Improving  Modes of Intervention:  Discussion, Exploration, Problem-solving and Support  Description of Group:   In this group patients will be encouraged to explore what they see as obstacles to their own wellness and recovery. They will be guided to discuss their thoughts, feelings, and behaviors related to these obstacles. The group will process together ways to cope with barriers, with attention given to specific choices patients can make. Each patient will be challenged to identify changes they are motivated to make in order to overcome their obstacles. This group will be process-oriented, with patients participating in exploration of their own experiences as well as giving and receiving support and challenge from other group members.  Summary of Patient Progress: Pt slept throughout the majority of group; however identified her depression as her main obstacle at this point describing that it makes her feel as if she is in quick sand and unable/unmotivated to progress. She also expresses that her support system often does not know how to help her.    Therapeutic Modalities:   Cognitive Behavioral Therapy Solution Focused Therapy Motivational Interviewing Relapse Prevention Therapy   Chad Cordial, LCSWA 04/10/2015 3:59 PM

## 2015-04-10 NOTE — Progress Notes (Signed)
D: When asked about her day pt stated, "it was a day". Pt then asked the writer about her sleep meds. When informed that she had vistaril, pt laughed and stated, "vistaril is not gonna make me sleepy." Later pt informed the writer that she was "out of it" when she came into the hosp, but feels better now".   A:  Support and encouragement was offered. 15 min checks continued for safety.  R: Pt remains safe.

## 2015-04-10 NOTE — Progress Notes (Signed)
D: Pt presents flat in affect and depressed in mood. Pt reports no improvement in her depression. Pt is passive for SI and contracts for safety. Pt visible within the dayroom but with minimal interactions with others. Pt reports withdrawal symptoms of aches and pains.  A: Writer administered scheduled and prn medications to pt, per MD orders. Continued support and availability as needed was extended to this pt. Staff continue to monitor pt with q69min checks.  R: No adverse drug reactions noted. Pt receptive to treatment. Pt remains safe at this time.

## 2015-04-10 NOTE — Progress Notes (Addendum)
[facility] MD Progress Note  04/10/2015 6:11 PM Nancy [name]  MRN:  [id number] Subjective:   Patient states she is feeling " a little better", but continues to report depression, sadness. She endorses some mild symptoms of opiate WDL- cramps, aches, some loose stools. Denies medication side effects. Objective : I have discussed case with treatment team and have met with patient. 28 year old female, recent suicidal attempt by overdosing on Ambien, which was prescribed for insomnia .  Patient reports ongoing depression, but states she is feeling safe and more supported on unit. Currently is able to contract for safety and denies SI at present. Does report ongoing depression- low energy, sense of sadness .  She is visible on unit, going to groups. No disruptive behaviors on unit. She is tolerating medications well thus far. She does state ( Topamax ) may be causing some degree of cognitive slowing, but presents cognitively grossly intact and fully alert and attentive .  She reports poor sleep and is interested in Trazodone trial for insomnia . ( as noted, had been on Ambien prior to admission , for insomnia )   Principal Problem: Bipolar I disorder, most recent episode depressed Diagnosis:   Patient Active Problem List   Diagnosis Date Noted  . Opiate abuse, episodic [F11.10] 04/09/2015  . MDD (major depressive disorder), recurrent episode, severe [F33.2] 04/09/2015  . Suicide attempt by other tranquilizer drug overdose [T43.592A] 04/09/2015  . 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 Dysmenorrhea [N94.6] 07/06/2013  . Dysmenorrhea [N94.6] 06/09/2013  . Menorrhagia with regular cycle [N92.0] 06/09/2013  . Leg edema [R60.0] 10/03/2011  . Edema leg [R60.0] 10/03/2011  . Bipolar I disorder, most recent episode depressed [F31.30] 09/26/2011  . Morbid obesity [E66.01] 09/26/2011  . Weakness of both legs [R29.898] 09/04/2011  . Difficulty in walking(719.7)  [R26.2] 09/04/2011  . 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 Bipolar disorder   . Tachycardia   . CIN I (cervical intraepithelial neoplasia I)   . OSA (obstructive sleep apnea)   . Anxiety   . Nephrolithiasis   . Enlarged heart     Past Surgical History  Procedure Laterality Date  . Bladder augmentation  2001    enlargement  . Dilitation & currettage/hystroscopy with thermachoice ablation N/A 07/06/2013    Procedure: DILATATION & CURETTAGE/HYSTEROSCOPY WITH THERMACHOICE ABLATION;  Surgeon: Jonnie Kind, MD;  Location: [facility];  Service: Gynecology;  Laterality: N/A;  total therapt time- 9 minutes 2 seconds; 87C  . Laparoscopic bilateral salpingectomy Bilateral 07/06/2013    Procedure: LAPAROSCOPIC BILATERAL SALPINGECTOMY;  Surgeon: Jonnie Kind, MD;  Location: [facility];  Service: Gynecology;  Laterality: Bilateral;   Family History:  Family History  Problem Relation Age of Onset  . Hypertension Mother   . Depression Mother   . Diabetes Mother   . Cancer Mother     brain tumor  . Hypertension Father   . Diabetes Father   . Depression Father   . Hypertension Sister   . Diabetes Sister   . Depression Sister   . Depression Brother   . Diabetes Brother   . Hypertension Brother   . Cancer Maternal Aunt   . Cancer Paternal Aunt   . Hypertension Maternal Grandmother   . Diabetes Maternal Grandmother   . Depression Maternal Grandmother   . Hypertension Maternal Grandfather   .  Diabetes Maternal Grandfather   . Depression Maternal Grandfather   . Hypertension Paternal Grandmother   . Diabetes Paternal Grandmother   . Depression Paternal Grandmother   . Hypertension Paternal Grandfather   . Diabetes Paternal Grandfather   . Depression Paternal Grandfather    Social History:  History  Alcohol Use  . Yes    Comment: occasionally     History  Drug Use No     Social History   Social History  . Marital Status: Single    Spouse Name: N/A  . Number of Children: N/A  . Years of Education: N/A   Social History Main Topics  . Smoking status: Current Every Day Smoker -- 0.50 packs/day    Types: Cigarettes  . Smokeless tobacco: Never Used  . Alcohol Use: Yes     Comment: occasionally  . Drug Use: No  . Sexual Activity: Not Currently   Other Topics Concern  . None   Social History Narrative   Additional History:    Sleep: fair   Appetite:  Good   Assessment:   Musculoskeletal: Strength & Muscle Tone: within normal limits Gait & Station: normal Patient leans: N/A   Psychiatric Specialty Exam: Physical Exam  ROS no chest pain, no shortness of breath, no headache - describes some vague cramps, aches, loose stools   Blood pressure 131/75, pulse 95, temperature 98.9 F (37.2 C), temperature source Oral, resp. rate 18, height 5' 7"  (1.702 m), weight 370 lb (167.831 kg).Body mass index is 57.94 kg/(m^2).  General Appearance: Fairly Groomed  Engineer, water::  Good  Speech:  Normal Rate  Volume:  Normal  Mood:  Depressed, but states feeling " a little better now"  Affect:  Constricted, but reactive, and does smile appropriately at times   Thought Process:  Linear  Orientation:  Other:  recent and remote grossly intact   Thought Content:  no hallucinations, no delusions , not internally preoccupied   Suicidal Thoughts:  No- at this time denies any suicide plan or intent and contracts for safety on the unit .  Homicidal Thoughts:  No  Memory:  recent and remote grossly intact   Judgement:  Fair  Insight:  Present  Psychomotor Activity:  Normal  Concentration:  Good  Recall:  Good  Fund of Knowledge:Good  Language: Good  Akathisia:  Negative  Handed:  Right  AIMS (if indicated):     Assets:  Communication Skills Desire for Improvement Resilience Talents/Skills  ADL's:   Improving   Cognition: WNL  Sleep:  Number of Hours: 6      Current Medications: Current Facility-Administered Medications  Medication Dose Route Frequency Provider Last Rate Last Dose  . acetaminophen (TYLENOL) tablet 650 mg  650 mg Oral Q6H PRN Dara Hoyer, PA-C      . cloNIDine (CATAPRES) tablet 0.1 mg  0.1 mg Oral QID Dara Hoyer, PA-C   0.1 mg at 04/10/15 1715   Followed by  . [START ON 04/12/2015] cloNIDine (CATAPRES) tablet 0.1 mg  0.1 mg Oral BH-qamhs Dara Hoyer, PA-C       Followed by  . [START ON 04/15/2015] cloNIDine (CATAPRES) tablet 0.1 mg  0.1 mg Oral QAC breakfast Dara Hoyer, PA-C      . dicyclomine (BENTYL) tablet 20 mg  20 mg Oral Q6H PRN Dara Hoyer, PA-C      . [START ON 04/11/2015] gabapentin (NEURONTIN) capsule 200 mg  200 mg Oral BID Jenne Campus, MD      .  lamoTRIgine (LAMICTAL) tablet 50 mg  50 mg Oral Daily Dara Hoyer, PA-C   50 mg at 04/10/15 0858  . loperamide (IMODIUM) capsule 2-4 mg  2-4 mg Oral PRN Dara Hoyer, PA-C      . LORazepam (ATIVAN) tablet 0.5 mg  0.5 mg Oral Q6H PRN Jenne Campus, MD   0.5 mg at 04/10/15 1524  . methocarbamol (ROBAXIN) tablet 500 mg  500 mg Oral Q8H PRN Dara Hoyer, PA-C      . nicotine (NICODERM CQ - dosed in mg/24 hours) patch 21 mg  21 mg Transdermal Daily Jenne Campus, MD   21 mg at 04/10/15 0901  . ondansetron (ZOFRAN-ODT) disintegrating tablet 4 mg  4 mg Oral Q6H PRN Dara Hoyer, PA-C      . risperiDONE (RISPERDAL) tablet 2 mg  2 mg Oral QHS Niel Hummer, NP   2 mg at 04/09/15 2220  . topiramate (TOPAMAX) tablet 50 mg  50 mg Oral BID Dara Hoyer, PA-C   50 mg at 04/10/15 1715    Lab Results:  Results for orders placed or performed during the hospital encounter of 04/09/15 (from the past 48 hour(s))  Glucose, capillary     Status: Abnormal   Collection Time: 04/09/15  1:27 AM  Result Value Ref Range   Glucose-Capillary 132 (H) 65 - 99 mg/dL  Glucose, capillary     Status: Abnormal   Collection Time: 04/09/15  6:11 AM  Result  Value Ref Range   Glucose-Capillary 137 (H) 65 - 99 mg/dL  Glucose, capillary     Status: Abnormal   Collection Time: 04/09/15  4:52 PM  Result Value Ref Range   Glucose-Capillary 158 (H) 65 - 99 mg/dL   Comment 1 Notify RN    Comment 2 Document in Chart   Glucose, capillary     Status: Abnormal   Collection Time: 04/09/15  9:07 PM  Result Value Ref Range   Glucose-Capillary 135 (H) 65 - 99 mg/dL  Glucose, capillary     Status: Abnormal   Collection Time: 04/10/15  6:12 AM  Result Value Ref Range   Glucose-Capillary 129 (H) 65 - 99 mg/dL    Physical Findings: AIMS: Facial and Oral Movements Muscles of Facial Expression: None, normal Lips and Perioral Area: None, normal Jaw: None, normal Tongue: None, normal,Extremity Movements Upper (arms, wrists, hands, fingers): None, normal Lower (legs, knees, ankles, toes): None, normal, Trunk Movements Neck, shoulders, hips: None, normal, Overall Severity Severity of abnormal movements (highest score from questions above): Minimal Incapacitation due to abnormal movements: None, normal Patient's awareness of abnormal movements (rate only patient's report): No Awareness, Dental Status Current problems with teeth and/or dentures?: No Does patient usually wear dentures?: No  CIWA:  CIWA-Ar Total: 2 COWS:  COWS Total Score: 3   Assessment - at this time patient reports partial improvement compared to admission- remains depressed, sad, but is not endorsing any suicidal ideations. Affect constricted but reactive and does smile appropriately at times. At this time tolerating medications well, denies side effects, except for mild cognitive slowing which may be related to topamax trial. . Main concern at this time is ongoing insomnia. Interested in Trazodone trial. Reports mild symptoms of opiate WDL but does not appear to be in any acute distress and presents calm.   Treatment Plan Summary: Daily contact with patient to assess and evaluate symptoms  and progress in treatment, Medication management, Plan inpatient treatment and medication management as below  Trazodone 50 mgrs QHS PRN for insomnia if needed  Clonidine detox protocol to manage opiate WDL. Topamax 50 mgrs BID for management of mood disorder/ bipolar disorder - may also help promote weight loss  Lamictal 50 mgrs QDAY for management of mood disorder/bipolar disorder Ativan 0.5 mgrs Q 6 hours PRN for anxiety as needed  Neurontin 200 mgrs BID for pain and for anxiety.  D/C Risperidone- no current symptoms of psychosis, and to minimize side effects, simplify therapeutic regimen.   Medical Decision Making:  Established Problem, Stable/Improving (1), New problem, with additional work up planned, Review of Psycho-Social Stressors (1), Review of Medication Regimen & Side Effects (2) and Review of New Medication or Change in Dosage (2)     Nancy [name] 04/10/2015, 6:11 PM
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
## 2015-04-10 NOTE — Progress Notes (Signed)
Recreation Therapy Notes  Date: 09.12.2016 Time: 9:30am Location: [address] Group room   Group Topic: Stress Management  Goal Area(s) Addresses:  Patient will actively participate in stress management techniques presented during session.   Behavioral Response: Did not attend.   Nancy [name], LRT/CTRS  Nancy [name] 04/10/2015 11:46 AM 

## 2015-04-10 NOTE — Progress Notes (Signed)
Adult Psychoeducational Group Note  Date:  04/10/2015 Time:  11:05 PM  Group Topic/Focus:  Wrap-Up Group:   The focus of this group is to help patients review their daily goal of treatment and discuss progress on daily workbooks.  Participation Level:  Active  Participation Quality:  Appropriate  Affect:  Appropriate  Cognitive:  Alert  Insight: Appropriate  Engagement in Group:  Engaged  Modes of Intervention:  Discussion  Additional Comments:  On a scale from 1-10 (1; worst 10; best), patient rated her day as a 4. Patient stated today was an very emotional day for her. Patient stated a few positive things that happened today. Which includes; singing today, enjoying her pees, and going outside.   Ilma Achee L Lakyra Tippins 04/10/2015, 11:05 PM

## 2015-04-11 MED ORDER — BUPROPION HCL ER (XL) 150 MG PO TB24
150.0000 mg | ORAL_TABLET | Freq: Every day | ORAL | Status: DC
  Administered 2015-04-12: 150 mg via ORAL
  Filled 2015-04-11 (×3): qty 1

## 2015-04-11 NOTE — Progress Notes (Signed)
Patient observed in milieu. Affect depressed, anxious. States, "I"m not doing all that well but I will be okay." Asked for and was given ativan prn with some relief. Rates her depression, hopelessness and anxiety all at an 8/10. Reports having some mild pain bilat in her legs as part of her withdrawal but states she does not need to take anything at this time. COWS is a "3" and VSS. Patient offered support, encouragement. Medicated per orders. Patient denies SI/HI and remains safe. Lawrence Marseilles

## 2015-04-11 NOTE — Tx Team (Signed)
Interdisciplinary Treatment Plan Update (Adult) Date: 04/11/2015   Date: 04/11/2015 9:01 AM  Progress in Treatment:  Attending groups: Yes  Participating in groups: Yes  Taking medication as prescribed: Yes  Tolerating medication: Yes  Family/Significant othe contact made: Yes with mother Patient understands diagnosis: Yes Discussing patient identified problems/goals with staff: Yes  Medical problems stabilized or resolved: Yes  Denies suicidal/homicidal ideation: Yes Patient has not harmed self or Others: Yes   New problem(s) identified: None identified at this time.   Discharge Plan or Barriers: Pt will return home and follow-up with outpatient providers. CSW assessing for appropriate referrals  Additional comments: n/a   Reason for Continuation of Hospitalization:  Depression Medication stabilization Suicidal ideation  Estimated length of stay: 3-5 days  Review of initial/current patient goals per problem list:   1.  Goal(s): Patient will participate in aftercare plan  Met:  Yes  Target date: 3-5 days from date of admission   As evidenced by: Patient will participate within aftercare plan AEB aftercare provider and housing plan at discharge being identified.   9/13: Pt will return home and follow-up with outpatient resources.  2.  Goal (s): Patient will exhibit decreased depressive symptoms and suicidal ideations.  Met:  No  Target date: 3-5 days from date of admission   As evidenced by: Patient will utilize self rating of depression at 3 or below and demonstrate decreased signs of depression or be deemed stable for discharge by MD.  9/13: Pt rates depression at 9/10 and continues to describe her mood as very depressed. Pt verbalizes her frustration that her suicide attempt was unsuccessful  3.  Goal(s): Patient will demonstrate decreased signs and symptoms of anxiety.  Met:  No  Target date: 3-5 days from date of admission   As evidenced by: Patient will  utilize self rating of anxiety at 3 or below and demonstrated decreased signs of anxiety, or be deemed stable for discharge by MD  9/13: Pt rates anxiety at 8/10 and presents as withdrawn at times.  Attendees:  Patient:    Family:    Physician: Dr. Parke Poisson, MD  04/11/2015 9:01 AM  Nursing: Lars Pinks, RN Case manager  04/11/2015 9:01 AM  Clinical Social Worker Norman Clay, MSW 04/11/2015 9:01 AM  Other: Lucinda Dell, Beverly Sessions Liasion 04/11/2015 9:01 AM  Clinical:   Loletta Specter, RN; Eulogio Bear, RN RN 04/11/2015 9:01 AM  Other: , RN Charge Nurse 04/11/2015 9:01 AM  Other:     Peri Maris, Latanya Presser MSW
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
## 2015-04-11 NOTE — [facility] Group Notes (Signed)
[facility] Group Notes:  (Nursing/MHT/Case Management/Adjunct)  Date:  04/11/2015  Time:  0900  Type of Therapy:  Nurse Education - SMART Method of Goal Setting  Participation Level:  Did Not Attend   Participation Quality:    Affect:    Cognitive:    Insight:    Engagement in Group:    Modes of Intervention:    Summary of Progress/Problems: Patient was meeting with Dr. Jama Flavors and was unable to attend group.   Merian Capron [facility] 04/11/2015, 0930

## 2015-04-11 NOTE — Plan of Care (Signed)
Problem: Ineffective individual coping Goal: STG: Patient will remain free from self harm Outcome: Progressing Patient has not engaged in self harm and denies urges, SI  Problem: Diagnosis: Increased Risk For Suicide Attempt Goal: STG-Patient Will Comply With Medication Regime Outcome: Progressing Patient is med compliant and verbalizes need for prn meds when appropriate.

## 2015-04-11 NOTE — [facility] Group Notes (Signed)
[facility] LCSW Group Therapy  04/11/2015   1:15 PM   Type of Therapy:  Group Therapy  Participation Level:  Active  Participation Quality:  Attentive, Sharing and Supportive  Affect:  Appropriate  Cognitive:  Alert and Oriented  Insight:  Developing/Improving and Engaged  Engagement in Therapy:  Developing/Improving and Engaged  Modes of Intervention:  Clarification, Confrontation, Discussion, Education, Exploration, Limit-setting, Orientation, Problem-solving, Rapport Building, Dance movement psychotherapist, Socialization and Support  Summary of Progress/Problems: The topic for group therapy was feelings about diagnosis.  Pt actively participated in group discussion on their past and current diagnosis and how they feel towards this.  Pt also identified how society and family members judge them, based on their diagnosis as well as stereotypes and stigmas. Patient shared that she feels less supported by others than she would if she were to suffer from a physical ailment due to the stigma surrounding mental illness. CSW and other group members provided emotional support and encouragement to patient.  Samuella Bruin, MSW, [employer] Clinical Social Worker [facility] [phone]

## 2015-04-11 NOTE — Progress Notes (Signed)
Patient ID: Nancy [name], female   DOB: 10-19-1985, 29 y.o.   MRN: [id number] D: Client visible on the unit, in dayroom with feet propped in another chair, reports pain "7" of 10, "it's manageable" refuses medications at this time, notes depression "8" and anxiety "7" of 10. Client notes groups have been positive and she has been using coping skills. "I been trying playing the piano, singing, and reading my bible, that helps me to be more positive" Client interacts appropriately with peers and staff. A: Writer encouraged client to continue to use coping skills and notify staff of increased pain or concerns. Writer reviewed medications administered as ordered. Staff will monitor q29min for safety. R: Client is safe on the unit, attended group.

## 2015-04-11 NOTE — Progress Notes (Signed)
Patient ID: Nancy [name], female   DOB: 1986/01/08, 29 y.o.   MRN: [id number] [facility] MD Progress Note  04/11/2015 4:33 PM Lynzy Rawles  MRN:  [id number] Subjective: Reports ongoing depression , but states she is feeling " a little better ".  Of note, at this time not endorsing medication side effects or significant symptoms of opiate withdrawal.  Objective : I have discussed case with treatment team and have met with patient. Some improvement but reports ongoing depression and endorses symptoms of depression such as poor energy, ongoing anhedonia, sadness. She describes depression has been exacerbated by her mother's plans to remarry in the near future. Although she states  "  I really likes my future step dad " and is happy for mother, she feels it worsens the void left by her father, now deceased, and with whom she was very close . She also states that this means her mother is moving out ( has been living with  Patient ) and so patient will have less frequent contact with mother " who is like my best friend".  She is tolerating medications well and subjective cognitive slowing she initially reported is now resolved . She feels medications are helping partially. We discussed option of adding an antidepressant to regimen- we reviewed options, benefits, and  potential risk of antidepressants to cause a switch towards manic symptoms.  Patient agrees to wellbutrin trial . Side effects reviewed .    Principal Problem: Bipolar I disorder, most recent episode depressed Diagnosis:   Patient Active Problem List   Diagnosis Date Noted  . Opiate abuse, episodic [F11.10] 04/09/2015  . MDD (major depressive disorder), recurrent episode, severe [F33.2] 04/09/2015  . Suicide attempt by other tranquilizer drug overdose [T43.592A] 04/09/2015  . 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 Dysmenorrhea [N94.6] 07/06/2013  . Dysmenorrhea [N94.6] 06/09/2013  . Menorrhagia  with regular cycle [N92.0] 06/09/2013  . Leg edema [R60.0] 10/03/2011  . Edema leg [R60.0] 10/03/2011  . Bipolar I disorder, most recent episode depressed [F31.30] 09/26/2011  . Morbid obesity [E66.01] 09/26/2011  . Weakness of both legs [R29.898] 09/04/2011  . Difficulty in walking(719.7) [R26.2] 09/04/2011  . 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 Bipolar disorder   . Tachycardia   . CIN I (cervical intraepithelial neoplasia I)   . OSA (obstructive sleep apnea)   . Anxiety   . Nephrolithiasis   . Enlarged heart     Past Surgical History  Procedure Laterality Date  . Bladder augmentation  2001    enlargement  . Dilitation & currettage/hystroscopy with thermachoice ablation N/A 07/06/2013    Procedure: DILATATION & CURETTAGE/HYSTEROSCOPY WITH THERMACHOICE ABLATION;  Surgeon: Jonnie Kind, MD;  Location: [facility];  Service: Gynecology;  Laterality: N/A;  total therapt time- 9 minutes 2 seconds; 87C  . Laparoscopic bilateral salpingectomy Bilateral 07/06/2013    Procedure: LAPAROSCOPIC BILATERAL SALPINGECTOMY;  Surgeon: Jonnie Kind, MD;  Location: [facility];  Service: Gynecology;  Laterality: Bilateral;   Family History:  Family History  Problem Relation Age of Onset  . Hypertension Mother   . Depression Mother   . Diabetes Mother   . Cancer Mother     brain tumor  . Hypertension Father   . Diabetes Father   . Depression Father   . Hypertension Sister   . Diabetes Sister   . Depression Sister   .  Depression Brother   . Diabetes Brother   . Hypertension Brother   . Cancer Maternal Aunt   . Cancer Paternal Aunt   . Hypertension Maternal Grandmother   . Diabetes Maternal Grandmother   . Depression Maternal Grandmother   . Hypertension Maternal Grandfather   . Diabetes Maternal Grandfather   . Depression Maternal Grandfather   . Hypertension Paternal Grandmother    . Diabetes Paternal Grandmother   . Depression Paternal Grandmother   . Hypertension Paternal Grandfather   . Diabetes Paternal Grandfather   . Depression Paternal Grandfather    Social History:  History  Alcohol Use  . Yes    Comment: occasionally     History  Drug Use No    Social History   Social History  . Marital Status: Single    Spouse Name: N/A  . Number of Children: N/A  . Years of Education: N/A   Social History Main Topics  . Smoking status: Current Every Day Smoker -- 0.50 packs/day    Types: Cigarettes  . Smokeless tobacco: Never Used  . Alcohol Use: Yes     Comment: occasionally  . Drug Use: No  . Sexual Activity: Not Currently   Other Topics Concern  . None   Social History Narrative   Additional History:    Sleep: fair   Appetite:  Good   Assessment:   Musculoskeletal: Strength & Muscle Tone: within normal limits Gait & Station: normal Patient leans: N/A   Psychiatric Specialty Exam: Physical Exam  ROS no chest pain, no shortness of breath, no headache - describes some vague cramps, aches, loose stools   Blood pressure 124/87, pulse 88, temperature 98.7 F (37.1 C), temperature source Oral, resp. rate 18, height _0  (1.702 m), weight 370 lb (167.831 kg).Body mass index is 57.94 kg/(m^2).  General Appearance: improved grooming  Eye Contact::  Good  Speech:  Normal Rate  Volume:  Normal  Mood:  Depressed,  Slowly improving, but still sad and constricted   Affect:  Constricted  Thought Process:  Linear  Orientation:  Other:  recent and remote grossly intact   Thought Content:  no hallucinations, no delusions , not internally preoccupied   Suicidal Thoughts:  No- at this time denies any suicide plan or intent and contracts for safety on the unit .  Homicidal Thoughts:  No  Memory:  recent and remote grossly intact   Judgement:  Fair  Insight:  Present  Psychomotor Activity:  Normal  Concentration:  Good  Recall:  Good  Fund of  Knowledge:Good  Language: Good  Akathisia:  Negative  Handed:  Right  AIMS (if indicated):     Assets:  Communication Skills Desire for Improvement Resilience Talents/Skills  ADL's:   Improving   Cognition: WNL  Sleep:  Number of Hours: 6.75     Current Medications: Current Facility-Administered Medications  Medication Dose Route Frequency Provider Last Rate Last Dose  . acetaminophen (TYLENOL) tablet 650 mg  650 mg Oral Q6H PRN Dara Hoyer, PA-C   650 mg at 04/10/15 2226  . cloNIDine (CATAPRES) tablet 0.1 mg  0.1 mg Oral QID Dara Hoyer, PA-C   0.1 mg at 04/11/15 1307   Followed by  . [START ON 04/12/2015] cloNIDine (CATAPRES) tablet 0.1 mg  0.1 mg Oral BH-qamhs Dara Hoyer, PA-C       Followed by  . [START ON 04/15/2015] cloNIDine (CATAPRES) tablet 0.1 mg  0.1 mg Oral QAC breakfast Dara Hoyer, PA-C      .  dicyclomine (BENTYL) tablet 20 mg  20 mg Oral Q6H PRN Dara Hoyer, PA-C      . gabapentin (NEURONTIN) capsule 200 mg  200 mg Oral BID Jenne Campus, MD   200 mg at 04/11/15 7209  . lamoTRIgine (LAMICTAL) tablet 50 mg  50 mg Oral Daily Dara Hoyer, PA-C   50 mg at 04/11/15 4709  . loperamide (IMODIUM) capsule 2-4 mg  2-4 mg Oral PRN Dara Hoyer, PA-C      . LORazepam (ATIVAN) tablet 0.5 mg  0.5 mg Oral Q6H PRN Jenne Campus, MD   0.5 mg at 04/11/15 1435  . methocarbamol (ROBAXIN) tablet 500 mg  500 mg Oral Q8H PRN Dara Hoyer, PA-C   500 mg at 04/11/15 1435  . nicotine (NICODERM CQ - dosed in mg/24 hours) patch 21 mg  21 mg Transdermal Daily Jenne Campus, MD   21 mg at 04/11/15 0810  . ondansetron (ZOFRAN-ODT) disintegrating tablet 4 mg  4 mg Oral Q6H PRN Dara Hoyer, PA-C      . topiramate (TOPAMAX) tablet 50 mg  50 mg Oral BID Dara Hoyer, PA-C   50 mg at 04/11/15 6283  . traZODone (DESYREL) tablet 50 mg  50 mg Oral QHS PRN Jenne Campus, MD   50 mg at 04/10/15 2226    Lab Results:  Results for orders placed or performed  during the hospital encounter of 04/09/15 (from the past 48 hour(s))  Glucose, capillary     Status: Abnormal   Collection Time: 04/09/15  4:52 PM  Result Value Ref Range   Glucose-Capillary 158 (H) 65 - 99 mg/dL   Comment 1 Notify RN    Comment 2 Document in Chart   Glucose, capillary     Status: Abnormal   Collection Time: 04/09/15  9:07 PM  Result Value Ref Range   Glucose-Capillary 135 (H) 65 - 99 mg/dL  Glucose, capillary     Status: Abnormal   Collection Time: 04/10/15  6:12 AM  Result Value Ref Range   Glucose-Capillary 129 (H) 65 - 99 mg/dL    Physical Findings: AIMS: Facial and Oral Movements Muscles of Facial Expression: None, normal Lips and Perioral Area: None, normal Jaw: None, normal Tongue: None, normal,Extremity Movements Upper (arms, wrists, hands, fingers): None, normal Lower (legs, knees, ankles, toes): None, normal, Trunk Movements Neck, shoulders, hips: None, normal, Overall Severity Severity of abnormal movements (highest score from questions above): Minimal Incapacitation due to abnormal movements: None, normal Patient's awareness of abnormal movements (rate only patient's report): No Awareness, Dental Status Current problems with teeth and/or dentures?: No Does patient usually wear dentures?: No  CIWA:  CIWA-Ar Total: 2 COWS:  COWS Total Score: 3   Assessment - Remains depressed, sad , constricted. Not actively suicidal and able to contract for safety. Tolerating medications well ( Topamax, Lamictal ). No significant opiate withdrawal symptoms at this time.  Agreeing to antidepressant trial ( Wellbutrin XL) to address significant depressive symptoms.   Treatment Plan Summary: Daily contact with patient to assess and evaluate symptoms and progress in treatment, Medication management, Plan inpatient treatment and medication management as below Trazodone 50 mgrs QHS PRN for insomnia if needed  Completing Clonidine detox protocol to manage opiate  WDL. Topamax 50 mgrs BID for management of mood disorder/ bipolar disorder - may also help promote weight loss  Lamictal 50 mgrs QDAY for management of mood disorder/bipolar disorder Ativan 0.5 mgrs Q 6 hours PRN for  anxiety as needed  Neurontin 200 mgrs BID for pain and for anxiety.  Start Wellbutrin XL 150 mgrs QAM to address depression     Medical Decision Making:  Established Problem, Stable/Improving (1), New problem, with additional work up planned, Review of Psycho-Social Stressors (1), Review of Medication Regimen & Side Effects (2) and Review of New Medication or Change in Dosage (2)     COBOS, FERNANDO 04/11/2015, 4:33 PM

## 2015-04-12 LAB — GLUCOSE, CAPILLARY: Glucose-Capillary: 131 mg/dL — ABNORMAL HIGH (ref 65–99)

## 2015-04-12 MED ORDER — VENLAFAXINE HCL ER 75 MG PO CP24
75.0000 mg | ORAL_CAPSULE | Freq: Every day | ORAL | Status: DC
  Administered 2015-04-13 – 2015-04-17 (×5): 75 mg via ORAL
  Filled 2015-04-12 (×7): qty 1

## 2015-04-12 MED ORDER — LAMOTRIGINE 25 MG PO TABS: 25.0000 mg | ORAL_TABLET | Freq: Every day | ORAL | Status: DC

## 2015-04-12 MED ORDER — TOPIRAMATE 25 MG PO TABS
50.0000 mg | ORAL_TABLET | Freq: Every day | ORAL | Status: DC
  Administered 2015-04-13 – 2015-04-17 (×6): 50 mg via ORAL
  Filled 2015-04-12 (×8): qty 2

## 2015-04-12 MED ORDER — PRAZOSIN HCL 1 MG PO CAPS
1.0000 mg | ORAL_CAPSULE | Freq: Every day | ORAL | Status: DC
  Administered 2015-04-12 – 2015-04-16 (×5): 1 mg via ORAL
  Filled 2015-04-12 (×8): qty 1

## 2015-04-12 MED ORDER — TOPIRAMATE 25 MG PO TABS
75.0000 mg | ORAL_TABLET | Freq: Every day | ORAL | Status: DC
  Administered 2015-04-12 – 2015-04-16 (×5): 75 mg via ORAL
  Filled 2015-04-12 (×7): qty 3

## 2015-04-12 NOTE — [facility] Group Notes (Signed)
[facility] LCSW Aftercare Discharge Planning Group Note  04/12/2015 8:45 AM  Pt did not attend, declined invitation.   Chad Cordial, LCSWA 04/12/2015 9:20 AM

## 2015-04-12 NOTE — Plan of Care (Signed)
Problem: Alteration in mood; excessive anxiety as evidenced by: Goal: STG-Pt will report an absence of self-harm thoughts/actions (Patient will report an absence of self-harm thoughts or actions)  Outcome: Progressing Client is safe on the unit AEB q1min safety checks, client denies SHI.

## 2015-04-12 NOTE — Progress Notes (Signed)
Recreation Therapy Notes  Date: 09.14.2016 Time: 9:30am  Location: [address] Group Room   Group Topic: Stress Management  Goal Area(s) Addresses:  Patient will actively participate in stress management techniques presented during session.   Behavioral Response: Did not attend.    Suttyn Cryder L Elon Eoff, LRT/CTRS  Alysha Doolan L 04/12/2015 11:32 AM 

## 2015-04-12 NOTE — [facility] Group Notes (Signed)
[facility] LCSW Group Therapy 04/12/2015 1:15 PM  Type of Therapy: Group Therapy- Emotion Regulation  Participation Level: Active   Participation Quality:  Appropriate  Affect: Appropriate  Cognitive: Alert and Oriented   Insight:  Developing/Improving  Engagement in Therapy: Developing/Improving and Engaged   Modes of Intervention: Clarification, Confrontation, Discussion, Education, Exploration, Limit-setting, Orientation, Problem-solving, Rapport Building, Dance movement psychotherapist, Socialization and Support  Summary of Progress/Problems: The topic for group today was emotional regulation. This group focused on both positive and negative emotion identification and allowed group members to process ways to identify feelings, regulate negative emotions, and find healthy ways to manage internal/external emotions. Group members were asked to reflect on a time when their reaction to an emotion led to a negative outcome and explored how alternative responses using emotion regulation would have benefited them. Group members were also asked to discuss a time when emotion regulation was utilized when a negative emotion was experienced. Pt identified guilt as a difficult emotion to regulate as she feels it is pervasive throughout her life. She expressed how her cultural modeling of "keeping it all together" exacerbates this guilty feeling. She interacted well with peers and was receptive to feedback.   Chad Cordial, LCSWA 04/12/2015 3:55 PM

## 2015-04-12 NOTE — Plan of Care (Signed)
Problem: Alteration in mood Goal: STG-Patient reports thoughts of self-harm to staff Outcome: Progressing Patient denies SI, urges to self harm.  Problem: Diagnosis: Increased Risk For Suicide Attempt Goal: STG-Patient Will Comply With Medication Regime Outcome: Progressing Patient med compliant and able to express need for prn's.

## 2015-04-12 NOTE — Progress Notes (Signed)
Follow up for continued spiritual support

## 2015-04-12 NOTE — Progress Notes (Signed)
Chaplain follow up with Nancy [name] at her request.    Nancy [name] expressed despair over SI.  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  She reported she frequently preaches on hope and empowerment, but is not able to feel these as a reality in her life now.   Working with Nancy [name]'s tradition, identified places in the biblical narrative where darkness and waiting prevail and where hope has not yet been made a reality.  Identified the pain of waiting in these places, and consistent faithfulness of god's reconciliation, which is "already, but not yet."    [facility] asked chaplain "what is the next step."  Identified with chaplain that her congregants frequently ask her this question and she has to have an answer.  Nancy [name] identified that what she may need to experience now is "waiting and rest"  Nancy [name] identified that rest feels like:  "not having to know the answer or have a word to preach, yet."  Nancy [name] requested prayers and prayed for understanding, rest, peace.

## 2015-04-12 NOTE — Progress Notes (Signed)
Found patient resting in bed this morning. Flat, depressed and states, "I'm not doing well today. I've had flashbacks and couldn't sleep last night. I took something but it didn't help all that much. I don't think I will be up much today. Just for meals and pills." Complaining of bilat leg pain of a 7/10 and confirms this is withdrawal related. COWS low and VSS. Patient offered support and reassurance. Encouraged to speak with MD regarding sleep concerns. Medicated per orders and xanax given x 2 today for anxiety which she indicates is helpful. Tylenol given and on reassess, patient is asleep. Denies SI/HI and remains safe. Lawrence Marseilles

## 2015-04-12 NOTE — Plan of Care (Signed)
Problem: Alteration in mood; excessive anxiety as evidenced by: Goal: STG-Pt can identify coping skills to manage panic/anxiety (Patient can identify at least ____ coping skills to manage panic/anxiety attack)  Outcome: Progressing Client can identify at least 3 coping skillsto manage anxiety. "I be trying I go to the dayroom and play the piano, sing, and read my bible"
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
## 2015-04-12 NOTE — Progress Notes (Signed)
Pt did not attend wrap up group this evening.  

## 2015-04-13 LAB — TSH: TSH: 1.187 u[IU]/mL (ref 0.350–4.500)

## 2015-04-13 MED ORDER — CLONAZEPAM 0.5 MG PO TABS: 0.5000 mg | ORAL_TABLET | Freq: Three times a day (TID) | ORAL | Status: DC | PRN

## 2015-04-13 MED ORDER — CLONAZEPAM 1 MG PO TABS
1.0000 mg | ORAL_TABLET | Freq: Two times a day (BID) | ORAL | Status: DC | PRN
  Administered 2015-04-13 – 2015-04-14 (×3): 1 mg via ORAL
  Filled 2015-04-13 (×3): qty 1

## 2015-04-13 MED ORDER — GABAPENTIN 100 MG PO CAPS
200.0000 mg | ORAL_CAPSULE | Freq: Three times a day (TID) | ORAL | Status: DC
  Administered 2015-04-13 – 2015-04-17 (×13): 200 mg via ORAL
  Filled 2015-04-13 (×18): qty 2

## 2015-04-13 MED ORDER — CLONAZEPAM 1 MG PO TABS: 1.0000 mg | ORAL_TABLET | Freq: Three times a day (TID) | ORAL | Status: DC | PRN

## 2015-04-13 NOTE — [facility] Group Notes (Signed)
[facility] LCSW Group Therapy 04/13/2015  1:15 PM   Type of Therapy: Group Therapy  Participation Level: Did Not Participate. Patient left at the beginning of group and did not return.   Samuella Bruin, MSW, [employer] Clinical Social Worker [facility] [phone]

## 2015-04-13 NOTE — Plan of Care (Signed)
Problem: Ineffective individual coping Goal: STG:Pt. will utilize relaxation techniques to reduce stress STG: Patient will utilize relaxation techniques to reduce stress levels  Outcome: Not Progressing Patient continues to rely on prn benzo. States too anxious to process, cope otherwise.  Problem: Alteration in mood; excessive anxiety as evidenced by: Goal: STG-Patient can identify triggers for anxiety Outcome: Progressing Patient states mom's visit last night was too difficult for her. "I don't want her to visit. It's too hard to say goodbye to her."

## 2015-04-13 NOTE — Progress Notes (Signed)
Pt attended karaoke group this evening.  
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
## 2015-04-13 NOTE — Progress Notes (Signed)
Patient came to this writer crying, asking to speak in private. Patient reporting family is unsupportive this evening telling her via phone, "you just need to get over it and stop with the drama." Patient states, "I just wish I'd taken just one more pill on Saturday. I wish my boyfriend had just been a little bit later finding me. I'm just done." Patient given support, allowed to vent feelings. Gently encouraged to limit phone calls from family tonight and re-evaluate tomorrow. Patient able to contract for safety with sustained eye contact. Assures this Clinical research associate she will come to staff should these emotions resurface. Patient safe, currently in dayroom with peers. Lawrence Marseilles

## 2015-04-13 NOTE — [facility] Group Notes (Signed)
[facility] Group Notes:  (Nursing/MHT/Case Management/Adjunct)  Date:  04/13/2015  Time:  0900  Type of Therapy:  Nurse Education - Self Care  Participation Level:  Active  Participation Quality:  Attentive  Affect:  Blunted  Cognitive:  Oriented  Insight:  Improving  Engagement in Group:  Engaged  Modes of Intervention:  Discussion and Education  Summary of Progress/Problems: Patient attended and participated in exercise pertaining to self care importance.   Merian Capron [facility] 04/13/2015, [phone]

## 2015-04-13 NOTE — Progress Notes (Signed)
Patient up and visible in the milieu. Observed/overheard being flirtatious, making jokes however states she is quite anxious and depressed. Tearful when peer was discharged. Did not wish to fill out self inventory. Requesting prn's for anxiety. Patient offered support. Encouraged to utilize coping skills. Discussed with treatment team, MD. Medicated per orders and med education provided. Denies physical complaints, opiate withdrawal symptoms other than mild bilat leg pain for which she does not desire any prn's. Will continue to support patient. Denies SI/HI and remains safe. Lawrence Marse[place]

## 2015-04-13 NOTE — Progress Notes (Signed)
D. Pt was up and visible in milieu this evening, however did not attend evening group activity as pt was in room at this time and was tearful and having a difficult time. Pt was able to share that she was having flash backs and these were overwhelming her, pt was able to process these feelings with staff and shared a lot about what has been going on in her life. Pt spoke about her mother getting re-married and spoke about how she is having a difficult time with that because she has been able to have her mother to herself for the past 3 years and feels this transition will be difficult. Pt shared that she has been thinking about getting a dog and moving in with her boyfriend and that both of them have PTSD and wonders how that will go once they start to live together. Pt was able to receive all medications this evening without incident and did not verbalize any complaints of withdrawal and also did not appear to be in any type of acute withdrawal. A. Support and encouragement provided. R. Safety maintained, will continue to monitor.
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
## 2015-04-13 NOTE — [facility] Suicide Risk Assessment (Signed)
[facility] INPATIENT:  Family/Significant Other Suicide Prevention Education  Suicide Prevention Education:  Contact Attempts: mother Nancy [name] [phone], (name of family member/significant other) has been identified by the patient as the family member/significant other with whom the patient will be residing, and identified as the person(s) who will aid the patient in the event of a mental health crisis.  With written consent from the patient, two attempts were made to provide suicide prevention education, prior to and/or following the patient's discharge.  We were unsuccessful in providing suicide prevention education.  A suicide education pamphlet was given to the patient to share with family/significant other.  Date and time of first attempt: 04/12/15 at 1pm Date and time of second attempt: 04/13/15 at 5:20pm  Nancy [name], [place] 04/13/2015, 5:21 PM

## 2015-04-13 NOTE — Tx Team (Signed)
Interdisciplinary Treatment Plan Update (Adult) Date: 04/13/2015   Date: 04/13/2015 9:05 AM  Progress in Treatment:  Attending groups: Yes  Participating in groups: Yes  Taking medication as prescribed: Yes  Tolerating medication: Yes  Family/Significant othe contact made: Yes with mother Patient understands diagnosis: Yes Discussing patient identified problems/goals with staff: Yes  Medical problems stabilized or resolved: Yes  Denies suicidal/homicidal ideation: Yes Patient has not harmed self or Others: Yes   New problem(s) identified: None identified at this time.   Discharge Plan or Barriers: Pt will return home and follow-up with outpatient providers.   Additional comments: n/a   Reason for Continuation of Hospitalization:  Depression Medication stabilization Suicidal ideation  Estimated length of stay: 2-3 days  Review of initial/current patient goals per problem list:   1.  Goal(s): Patient will participate in aftercare plan  Met:  Yes  Target date: 3-5 days from date of admission   As evidenced by: Patient will participate within aftercare plan AEB aftercare provider and housing plan at discharge being identified.   9/13: Pt will return home and follow-up with outpatient resources.  2.  Goal (s): Patient will exhibit decreased depressive symptoms and suicidal ideations.  Met:  Goal Progressing  Target date: 3-5 days from date of admission   As evidenced by: Patient will utilize self rating of depression at 3 or below and demonstrate decreased signs of depression or be deemed stable for discharge by MD.  9/13: Pt rates depression at 9/10 and continues to describe her mood as very depressed. Pt verbalizes her frustration that her suicide attempt was unsuccessful  9/15: Goal Progressing.   3.  Goal(s): Patient will demonstrate decreased signs and symptoms of anxiety.  Met:  Goal Progressing  Target date: 3-5 days from date of admission   As evidenced  by: Patient will utilize self rating of anxiety at 3 or below and demonstrated decreased signs of anxiety, or be deemed stable for discharge by MD  9/13: Pt rates anxiety at 8/10 and presents as withdrawn at times.  9/15: Goal Progressing.  Attendees: Patient:    Family:    Physician: Dr. Parke Poisson 04/13/2015 9:30 AM  Nursing: Loletta Specter, Janann August, Darrol Angel, RN 04/13/2015 9:30 AM  Clinical Social Worker: Tilden Fossa,  [place] 04/13/2015 9:30 AM  Other: Nira Conn Smart LCSWA  04/13/2015 9:30 AM  Other: Lucinda Dell, Beverly Sessions Liaison 04/13/2015 9:30 AM  Other: Lars Pinks, Case Manager 04/13/2015 9:30 AM  Other: Tilford Pillar Rankin , NP 04/13/2015 9:30 AM  Other:    Other:    Other:      Tilden Fossa, MSW, [place] Worker [facility] [phone]

## 2015-04-14 MED ORDER — LAMOTRIGINE 25 MG PO TABS
75.0000 mg | ORAL_TABLET | Freq: Every day | ORAL | Status: DC
  Administered 2015-04-15 – 2015-04-17 (×3): 75 mg via ORAL
  Filled 2015-04-14 (×6): qty 3

## 2015-04-14 MED ORDER — CLONAZEPAM 1 MG PO TABS
1.0000 mg | ORAL_TABLET | Freq: Three times a day (TID) | ORAL | Status: DC | PRN
  Administered 2015-04-14 – 2015-04-17 (×10): 1 mg via ORAL
  Filled 2015-04-14 (×10): qty 1

## 2015-04-14 NOTE — [facility] Group Notes (Signed)
[facility] LCSW Group Therapy 04/14/2015 1:15pm  Type of Therapy: Group Therapy- Feelings Around Relapse and Recovery  Pt did not attend, declined invitation.   Chad Cordial, Theresia Majors [phone] 04/14/2015 4:06 PM
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
## 2015-04-14 NOTE — Progress Notes (Signed)
D: Nancy [name] states her day started off good until the visitor she was expecting did not show up. Also there has been some discord amongst her and one of the MHTs on the unit. She states she just wants to be treated with respect. She spoke with her mother today over the phone and she states there were no arguments and the phone conversation went well. She describes the day as "Good". She states when she felt frustrated today she went and "Banged it out" on the piano and sang some gospel songs. Her mood is somber and her affect is sad today. A: Medications administered as prescribed. R: Will continue to monitor patient for safety and medication effectiveness.

## 2015-04-14 NOTE — Progress Notes (Signed)
[facility] Group Notes:  (Nursing/MHT/Case Management/Adjunct)  Date:  04/14/2015  Time:  8:46 PM  Type of Therapy:  Psychoeducational Skills  Participation Level:  Active  Participation Quality:  Appropriate and Attentive  Affect:  Appropriate  Cognitive:  Appropriate  Insight:  Appropriate and Good  Engagement in Group:  Engaged  Modes of Intervention:  Activity  Summary of Progress/Problems: Pts played a therapeutic activity of Wellness Jeopardy. Pt stated one positive thing about today is coming to wrap up group. Pt stated she hasn't laughed that much in months.  Caswell Corwin 04/14/2015, 8:46 PM
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
## 2015-04-15 NOTE — [facility] Group Notes (Signed)
[facility] Group Notes:  (Nursing/MHT/Case Management/Adjunct)  Date:  04/15/2015  Time:  12:13 PM  Type of Therapy:  Nurse Education/ Identifying Needs : the group is  Focused on helping patients identify their needs as well as identify unhealthy behaviors that keep them stuck.  Participation Level:  Active  Participation Quality:  Attentive  Affect:  Appropriate  Cognitive:  Appropriate  Insight:  Good  Engagement in Group:  Engaged  Modes of Intervention:  Discussion  Summary of Progress/Problems:  Rich Brave 04/15/2015, 12:13 PM

## 2015-04-15 NOTE — [facility] Group Notes (Signed)
[facility] Group Notes:  (Clinical Social Work)  04/15/2015     1:15-2:15PM  Summary of Progress/Problems:   The main focus of today's process group was to learn how to use a decisional balance exercise to think about changing coping techniques.  Patients listed needs on the whiteboard and unhealthy coping techniques often used to fill needs.  Motivational Interviewing and the whiteboard were utilized to help patients explore in depth the perceived benefits and costs of unhealthy coping techniques, as well as the  benefits and costs of replacing that with a healthy coping skills.     The patient was only present for part of group, but spoke several times in agreement with others, expressed her thoughts appropriately and insightfully.  Type of Therapy:  Group Therapy - Process   Participation Level:  Active  Participation Quality:  Attentive  Affect:  Blunted  Cognitive:  Alert, Appropriate and Oriented  Insight:  Engaged  Engagement in Therapy:  Engaged  Modes of Intervention:  Education, Motivational Interviewing  Ambrose Mantle, LCSW 04/15/2015, 5:13 PM

## 2015-04-15 NOTE — Progress Notes (Signed)
Patient ID: Nancy [name], female   DOB: 1985/10/10, 29 y.o.   MRN: [id number] [facility] MD Progress Note  04/15/2015  Nancy [name]  MRN:  [id number] Subjective: Pt states: "I'm about the same. I should be ready to go home soon. I'm still a little depressed for sure, but I'm doing better and I hope I can go home on Monday".   Objective: Pt seen and chart reviewed. Pt is alert/oriented x4, calm, cooperative, and appropriate during assessment. Pt has been seen interacting with others on the unit appropriately. She is also smiling appropriately during assessment whereas in the past it appeared to be feigned. Pt denies suicidal/homicidal ideation and psychosis and does not appear to be responding to internal stimuli. Cites good sleep and good appetite as well. Denies medication side effects.    Principal Problem: Bipolar I disorder, most recent episode depressed Diagnosis:   Patient Active Problem List   Diagnosis Date Noted  . Opiate abuse, episodic [F11.10] 04/09/2015  . MDD (major depressive disorder), recurrent episode, severe [F33.2] 04/09/2015  . Suicide attempt by other tranquilizer drug overdose [T43.592A] 04/09/2015  . 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 Dysmenorrhea [N94.6] 07/06/2013  . Dysmenorrhea [N94.6] 06/09/2013  . Menorrhagia with regular cycle [N92.0] 06/09/2013  . Leg edema [R60.0] 10/03/2011  . Edema leg [R60.0] 10/03/2011  . Bipolar I disorder, most recent episode depressed [F31.30] 09/26/2011  . Morbid obesity [E66.01] 09/26/2011  . Weakness of both legs [R29.898] 09/04/2011  . Difficulty in walking(719.7) [R26.2] 09/04/2011  . 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 Bipolar disorder   . Tachycardia   . CIN I (cervical intraepithelial neoplasia I)   . OSA (obstructive sleep apnea)   . Anxiety   . Nephrolithiasis   .  Enlarged heart     Past Surgical History  Procedure Laterality Date  . Bladder augmentation  2001    enlargement  . Dilitation & currettage/hystroscopy with thermachoice ablation N/A 07/06/2013    Procedure: DILATATION & CURETTAGE/HYSTEROSCOPY WITH THERMACHOICE ABLATION;  Surgeon: Tilda Burrow, MD;  Location: [facility];  Service: Gynecology;  Laterality: N/A;  total therapt time- 9 minutes 2 seconds; 87C  . Laparoscopic bilateral salpingectomy Bilateral 07/06/2013    Procedure: LAPAROSCOPIC BILATERAL SALPINGECTOMY;  Surgeon: Tilda Burrow, MD;  Location: [facility];  Service: Gynecology;  Laterality: Bilateral;   Family History:  Family History  Problem Relation Age of Onset  . Hypertension Mother   . Depression Mother   . Diabetes Mother   . Cancer Mother     brain tumor  . Hypertension Father   . Diabetes Father   . Depression Father   . Hypertension Sister   . Diabetes Sister   . Depression Sister   . Depression Brother   . Diabetes Brother   . Hypertension Brother   . Cancer Maternal Aunt   . Cancer Paternal Aunt   . Hypertension Maternal Grandmother   . Diabetes Maternal Grandmother   . Depression Maternal Grandmother   . Hypertension Maternal Grandfather   . Diabetes Maternal Grandfather   . Depression Maternal Grandfather   . Hypertension Paternal Grandmother   . Diabetes Paternal Grandmother   . Depression Paternal Grandmother   . Hypertension Paternal Grandfather   . Diabetes Paternal Grandfather   . Depression Paternal Grandfather    Social History:  History  Alcohol Use  .  Yes    Comment: occasionally     History  Drug Use No    Social History   Social History  . Marital Status: Single    Spouse Name: N/A  . Number of Children: N/A  . Years of Education: N/A   Social History Main Topics  . Smoking status: Current Every Day Smoker -- 0.50 packs/day    Types: Cigarettes  . Smokeless tobacco: Never Used  . Alcohol Use: Yes     Comment: occasionally   . Drug Use: No  . Sexual Activity: Not Currently   Other Topics Concern  . None   Social History Narrative   Additional History:    Sleep: fair yet improving  Appetite:  Good  Assessment: See above  Musculoskeletal: Strength & Muscle Tone: within normal limits Gait & Station: normal Patient leans: N/A   Psychiatric Specialty Exam: Physical Exam  Review of Systems  Psychiatric/Behavioral: Positive for depression and suicidal ideas. The patient is nervous/anxious and has insomnia.   All other systems reviewed and are negative.  no chest pain, no shortness of breath, no headache - describes some vague cramps, aches, loose stools   Blood pressure 102/68, pulse 94, temperature 98.2 F (36.8 C), temperature source Oral, resp. rate 20, height 5\' 7"  (1.702 m), weight 167.831 kg (370 lb).Body mass index is 57.94 kg/(m^2).  General Appearance: Casual, fairly groomed  Eye Contact::  Good  Speech:  Normal Rate  Volume:  Normal  Mood:   Depressed, although improving  Affect:  Appropriate, Congruent and Depressed-    Thought Process:  Coherent, Goal Directed and Linear  Orientation:  Other:  recent and remote grossly intact   Thought Content:  no hallucinations, no delusions , not internally preoccupied   Suicidal Thoughts:  No- at this time denies any suicide plan or intent and contracts for safety on the unit   Homicidal Thoughts:  No  Memory:  recent and remote grossly intact   Judgement:  Fair  Insight:  Present  Psychomotor Activity:  Normal  Concentration:  Good  Recall:  Good  Fund of Knowledge:Good  Language: Good  Akathisia:  No  Handed:  Right  AIMS (if indicated):     Assets:  Communication Skills Desire for Improvement Resilience Talents/Skills  ADL's:   Improving   Cognition: WNL  Sleep:  Number of Hours: 6.75     Current Medications: Current Facility-Administered Medications  Medication Dose Route Frequency Provider Last Rate Last Dose  .  acetaminophen (TYLENOL) tablet 650 mg  650 mg Oral Q6H PRN Court Joy, PA-C   650 mg at 04/12/15 2248  . clonazePAM (KLONOPIN) tablet 1 mg  1 mg Oral TID PRN Craige Cotta, MD   1 mg at 04/15/15 0647  . cloNIDine (CATAPRES) tablet 0.1 mg  0.1 mg Oral QAC breakfast Court Joy, PA-C   0.1 mg at 04/15/15 1610  . gabapentin (NEURONTIN) capsule 200 mg  200 mg Oral TID Craige Cotta, MD   200 mg at 04/15/15 0831  . lamoTRIgine (LAMICTAL) tablet 75 mg  75 mg Oral Daily Craige Cotta, MD   75 mg at 04/15/15 0831  . nicotine (NICODERM CQ - dosed in mg/24 hours) patch 21 mg  21 mg Transdermal Daily Craige Cotta, MD   21 mg at 04/15/15 0832  . prazosin (MINIPRESS) capsule 1 mg  1 mg Oral QHS Craige Cotta, MD   1 mg at 04/14/15 2128  . topiramate (TOPAMAX)  tablet 50 mg  50 mg Oral Daily Craige Cotta, MD   50 mg at 04/15/15 0834  . topiramate (TOPAMAX) tablet 75 mg  75 mg Oral QHS Craige Cotta, MD   75 mg at 04/14/15 2128  . traZODone (DESYREL) tablet 50 mg  50 mg Oral QHS PRN Craige Cotta, MD   50 mg at 04/14/15 2128  . venlafaxine XR (EFFEXOR-XR) 24 hr capsule 75 mg  75 mg Oral Q breakfast Craige Cotta, MD   75 mg at 04/15/15 0831    Lab Results:  No results found for this or any previous visit (from the past 48 hour(s)).  Physical Findings: AIMS: Facial and Oral Movements Muscles of Facial Expression: None, normal Lips and Perioral Area: None, normal Jaw: None, normal Tongue: None, normal,Extremity Movements Upper (arms, wrists, hands, fingers): None, normal Lower (legs, knees, ankles, toes): None, normal, Trunk Movements Neck, shoulders, hips: None, normal, Overall Severity Severity of abnormal movements (highest score from questions above): None, normal Incapacitation due to abnormal movements: None, normal Patient's awareness of abnormal movements (rate only patient's report): No Awareness, Dental Status Current problems with teeth and/or dentures?:  No Does patient usually wear dentures?: No  CIWA:  CIWA-Ar Total: 0 COWS:  COWS Total Score: 1   Assessment : See above  Treatment Plan Summary: Daily contact with patient to assess and evaluate symptoms and progress in treatment, Medication management, Plan inpatient treatment and medication management as below Continue Trazodone 50 mgrs QHS PRN for insomnia if needed  Continue  Clonidine detox protocol to manage opiate WDL. Continue Topamax  50 mgrs QAM and 75 mgrs QHS to further address mood disorder  Increase Lamictal  To 75 mgrs QDAY for management of mood disorder/bipolar disorder Increase  Klonopin to 1 mgr TID PRN for Anxiety ( may help better as it is longer acting than Ativan )  Continue Neurontin   200 mgrs TID  To address ongoing sense of anxiety.  Continue Minipress 1 mgr QHS for PTSD related insomnia  Continue Effexor XR 75 mgrs QDAY to address PTSD and Depression.    Medical Decision Making:  Established Problem, Stable/Improving (1), New problem, with additional work up planned, Review of Psycho-Social Stressors (1), Review of Medication Regimen & Side Effects (2) and Review of New Medication or Change in Dosage (2)  Beau Fanny, FNP-BC 04/15/2015, 4:22 PM

## 2015-04-15 NOTE — Progress Notes (Signed)
D) Pt has been attending the groups, but sits with her eyes closed and will engage occassionally. Pt's affect is flat and mood depressed. Rates her depression at an 8, hopelessness at a 9 and her anxiety at a 7. States that a lot of her issues began in 2014 when she had a hysterectomy. States that she is not on any hormones presently.  A) Given support, reassurance and praise. Provided with a 1:1. Encouragement given. R) Pt denies SI and HI.

## 2015-04-15 NOTE — Progress Notes (Signed)
[facility] Group Notes:  (Nursing/MHT/Case Management/Adjunct)  Date:  04/15/2015  Time:  8:39 PM  Type of Therapy:  Psychoeducational Skills  Participation Level:  Active  Participation Quality:  Appropriate and Attentive  Affect:  Appropriate  Cognitive:  Appropriate  Insight:  Appropriate  Engagement in Group:  Engaged  Modes of Intervention:  Activity  Summary of Progress/Problems: Pts played a therapeutic activity of Mental Health Jeopardy. Pt stated one positive thing about today is that she was able to see her boyfriend today who she hasn't seen in a week.  Caswell Corwin 04/15/2015, 8:39 PM

## 2015-04-16 NOTE — Progress Notes (Signed)
Patient ID: Nancy [name], female   DOB: 1986-05-25, 29 y.o.   MRN: [id number] [facility] MD Progress Note  04/16/2015  Nancy [name]  MRN:  [id number] Subjective: Pt states: "I'm better and I'm hoping I will be ready to go on Monday or Tuesday. I'll talk to Dr. Jama Flavors tomorrow and see if I'm at that point but I probably will be."  Objective: Pt seen and chart reviewed. Pt is alert/oriented x4, calm, cooperative, and appropriate during assessment. Pt was interacting well with others today in addition to visiting with her mother this evening. Pt reports that her depression and anxiety are improving greatly, but that she is not sure if she will be safe to discharge. Pt is somewhat guarded today. Pt denies suicidal/homicidal ideation and psychosis and does not appear to be responding to internal stimuli. Cites good sleep and good appetite as well. Denies medication side effects.    Principal Problem: Bipolar I disorder, most recent episode depressed Diagnosis:   Patient Active Problem List   Diagnosis Date Noted  . Opiate abuse, episodic [F11.10] 04/09/2015    Priority: High  . MDD (major depressive disorder), recurrent episode, severe [F33.2] 04/09/2015    Priority: High  . Suicide attempt by other tranquilizer drug overdose [T43.592A] 04/09/2015    Priority: High  . 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 Bipolar I disorder, most recent episode depressed [F31.30] 09/26/2011    Priority: High  . Dysmenorrhea [N94.6] 07/06/2013  . Dysmenorrhea [N94.6] 06/09/2013  . Menorrhagia with regular cycle [N92.0] 06/09/2013  . Leg edema [R60.0] 10/03/2011  . Edema leg [R60.0] 10/03/2011  . Morbid obesity [E66.01] 09/26/2011  . Weakness of both legs [R29.898] 09/04/2011  . Difficulty in walking(719.7) [R26.2] 09/04/2011  . 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 Bipolar disorder   . Tachycardia   . CIN I (cervical intraepithelial neoplasia I)   . OSA (obstructive sleep apnea)   . Anxiety   . Nephrolithiasis   . Enlarged heart     Past Surgical History  Procedure Laterality Date  . Bladder augmentation  2001    enlargement  . Dilitation & currettage/hystroscopy with thermachoice ablation N/A 07/06/2013    Procedure: DILATATION & CURETTAGE/HYSTEROSCOPY WITH THERMACHOICE ABLATION;  Surgeon: Tilda Burrow, MD;  Location: [facility];  Service: Gynecology;  Laterality: N/A;  total therapt time- 9 minutes 2 seconds; 87C  . Laparoscopic bilateral salpingectomy Bilateral 07/06/2013    Procedure: LAPAROSCOPIC BILATERAL SALPINGECTOMY;  Surgeon: Tilda Burrow, MD;  Location: [facility];  Service: Gynecology;  Laterality: Bilateral;   Family History:  Family History  Problem Relation Age of Onset  . Hypertension Mother   . Depression Mother   . Diabetes Mother   . Cancer Mother     brain tumor  . Hypertension Father   . Diabetes Father   . Depression Father   . Hypertension Sister   . Diabetes Sister   . Depression Sister   . Depression Brother   . Diabetes Brother   . Hypertension Brother   . Cancer Maternal Aunt   . Cancer Paternal Aunt   . Hypertension Maternal Grandmother   . Diabetes Maternal Grandmother   . Depression Maternal Grandmother   . Hypertension Maternal Grandfather   . Diabetes Maternal Grandfather   . Depression Maternal Grandfather   . Hypertension Paternal Grandmother   .  Diabetes Paternal Grandmother   . Depression Paternal Grandmother   . Hypertension Paternal Grandfather   . Diabetes Paternal Grandfather   . Depression Paternal Grandfather    Social History:  History  Alcohol Use  . Yes    Comment: occasionally     History  Drug Use No    Social History   Social History  . Marital Status: Single    Spouse Name: N/A  . Number of Children: N/A  . Years of Education: N/A   Social History  Main Topics  . Smoking status: Current Every Day Smoker -- 0.50 packs/day    Types: Cigarettes  . Smokeless tobacco: Never Used  . Alcohol Use: Yes     Comment: occasionally  . Drug Use: No  . Sexual Activity: Not Currently   Other Topics Concern  . None   Social History Narrative   Additional History:    Sleep: Good   Appetite:  Good  Assessment: See above  Musculoskeletal: Strength & Muscle Tone: within normal limits Gait & Station: normal Patient leans: N/A   Psychiatric Specialty Exam: Physical Exam  Review of Systems  Psychiatric/Behavioral: Positive for depression and suicidal ideas. The patient is nervous/anxious and has insomnia.   All other systems reviewed and are negative.  no chest pain, no shortness of breath, no headache - describes some vague cramps, aches, loose stools   Blood pressure 132/87, pulse 116, temperature 98.4 F (36.9 C), temperature source Oral, resp. rate 16, height  (1.702 m), weight 167.831 kg (370 lb).Body mass index is 57.94 kg/(m^2).  General Appearance: Casual, fairly groomed  Eye Contact::  Good  Speech:  Normal Rate  Volume:  Normal  Mood:   Depressed, although improving  Affect:  Appropriate, Congruent and Depressed-    Thought Process:  Coherent, Goal Directed and Linear  Orientation:  Other:  recent and remote grossly intact   Thought Content:  no hallucinations, no delusions , not internally preoccupied   Suicidal Thoughts:  No- at this time denies any suicide plan or intent and contracts for safety on the unit   Homicidal Thoughts:  No  Memory:  recent and remote grossly intact   Judgement:  Fair  Insight:  Present  Psychomotor Activity:  Normal  Concentration:  Good  Recall:  Good  Fund of Knowledge:Good  Language: Good  Akathisia:  No  Handed:  Right  AIMS (if indicated):     Assets:  Communication Skills Desire for Improvement Resilience Talents/Skills  ADL's:   Improving   Cognition: WNL  Sleep:   Number of Hours: 6.25     Current Medications: Current Facility-Administered Medications  Medication Dose Route Frequency Nolah Krenzer Last Rate Last Dose  . acetaminophen (TYLENOL) tablet 650 mg  650 mg Oral Q6H PRN Court Joy, PA-C   650 mg at 04/12/15 2248  . clonazePAM (KLONOPIN) tablet 1 mg  1 mg Oral TID PRN Craige Cotta, MD   1 mg at 04/16/15 1320  . gabapentin (NEURONTIN) capsule 200 mg  200 mg Oral TID Craige Cotta, MD   200 mg at 04/16/15 1717  . lamoTRIgine (LAMICTAL) tablet 75 mg  75 mg Oral Daily Craige Cotta, MD   75 mg at 04/16/15 0807  . nicotine (NICODERM CQ - dosed in mg/24 hours) patch 21 mg  21 mg Transdermal Daily Craige Cotta, MD   21 mg at 04/16/15 0807  . prazosin (MINIPRESS) capsule 1 mg  1 mg Oral QHS Madaline Guthrie  A Cobos, MD   1 mg at 04/15/15 2306  . topiramate (TOPAMAX) tablet 50 mg  50 mg Oral Daily Craige Cotta, MD   50 mg at 04/16/15 1610  . topiramate (TOPAMAX) tablet 75 mg  75 mg Oral QHS Craige Cotta, MD   75 mg at 04/15/15 2305  . traZODone (DESYREL) tablet 50 mg  50 mg Oral QHS PRN Craige Cotta, MD   50 mg at 04/15/15 2306  . venlafaxine XR (EFFEXOR-XR) 24 hr capsule 75 mg  75 mg Oral Q breakfast Craige Cotta, MD   75 mg at 04/16/15 9604    Lab Results:  No results found for this or any previous visit (from the past 48 hour(s)).  Physical Findings: AIMS: Facial and Oral Movements Muscles of Facial Expression: None, normal Lips and Perioral Area: None, normal Jaw: None, normal Tongue: None, normal,Extremity Movements Upper (arms, wrists, hands, fingers): None, normal Lower (legs, knees, ankles, toes): None, normal, Trunk Movements Neck, shoulders, hips: None, normal, Overall Severity Severity of abnormal movements (highest score from questions above): None, normal Incapacitation due to abnormal movements: None, normal Patient's awareness of abnormal movements (rate only patient's report): No Awareness, Dental  Status Current problems with teeth and/or dentures?: No Does patient usually wear dentures?: No  CIWA:  CIWA-Ar Total: 4 COWS:  COWS Total Score: 2   Assessment : See above  *I have reviewed treatment plan on 04/16/15 and concur as follows:   Treatment Plan Summary: Daily contact with patient to assess and evaluate symptoms and progress in treatment, Medication management, Plan inpatient treatment and medication management as below Continue Trazodone 50 mgrs QHS PRN for insomnia if needed  Continue  Clonidine detox protocol to manage opiate WDL. Continue Topamax  50 mgrs QAM and 75 mgrs QHS to further address mood disorder  Increase Lamictal  To 75 mgrs QDAY for management of mood disorder/bipolar disorder Increase  Klonopin to 1 mgr TID PRN for Anxiety ( may help better as it is longer acting than Ativan )  Continue Neurontin   200 mgrs TID  To address ongoing sense of anxiety.  Continue Minipress 1 mgr QHS for PTSD related insomnia  Continue Effexor XR 75 mgrs QDAY to address PTSD and Depression.    Medical Decision Making:  Established Problem, Stable/Improving (1), New problem, with additional work up planned, Review of Psycho-Social Stressors (1), Review of Medication Regimen & Side Effects (2) and Review of New Medication or Change in Dosage (2)  Beau Fanny, FNP-BC 04/16/2015, 6:16 PM

## 2015-04-16 NOTE — Progress Notes (Signed)
D: Nancy [name] is very pleasant and conversant with me this evening. Describes her day as frustrating due to a few groups she attended. [name] felt some of her thoughts and opinions were overlooked at one of her sessions. She rates Depression 6/10 and Anxiety 8-9/10. She was visited by her boyfriend today and that made her happy. She is still somewhat sad about her mother getting married and not living with her anymore. She had taken total care of her mother for a while (mom had brain cancer) and is having issues with attachment and letting go. She is working through this. Her mother is slowly moving her things out of [name]'s place and this is helping her cope as she is not moving out abruptly. She was able to cope with her frustration by playing the piano, writing a song and singing again today. A: Medications administered as prescribed. R: Will continue to monitor patient for safety and medication effectiveness.

## 2015-04-16 NOTE — Progress Notes (Signed)
Adult Psychoeducational Group Note  Date:  04/16/2015 Time:  9:55 PM  Group Topic/Focus:  Wrap-Up Group:   The focus of this group is to help patients review their daily goal of treatment and discuss progress on daily workbooks.  Participation Level:  Active  Participation Quality:  Appropriate  Affect:  Appropriate  Cognitive:  Appropriate  Insight: Appropriate  Engagement in Group:  Engaged  Modes of Intervention:  Discussion  Additional Comments: The patient expressed that she attend group.The patient also said that she learn that she has unhealthy people in her life.  Octavio Manns 04/16/2015, 9:55 PM

## 2015-04-16 NOTE — [facility] Group Notes (Signed)
[facility] Group Notes:  (Clinical Social Work)  04/16/2015  1:15-2:15PM  Summary of Progress/Problems:   The main focus of today's process group was to   1)  discuss the importance of adding supports  2)  define health supports versus unhealthy supports  3)  identify the patient's current unhealthy supports and plan how to handle them  4)  Identify the patient's current healthy supports and plan what to add.  An emphasis was placed on using counselor, doctor, therapy groups, 12-step groups, and problem-specific support groups to expand supports.    The patient expressed full comprehension of the concepts presented, and agreed that there is a need to add more supports.  The patient stated that of her 12 siblings, 11 are unhealthy supports.  Only one has been in touch with her since being admitted to the hospital 8 days ago.  She talked about how a brother she has supported through a relationship crisis with his wife, and who has had depression himself, has told her she needs to "get over it."  The patient and other group members held a lengthy discussion about how hurtful such statements are and what to do, what can be done.  She was given a variety of ideas and also gave feedback to others very appropriately throughout group.  Type of Therapy:  Process Group with Motivational Interviewing  Participation Level:  Active  Participation Quality:  Appropriate, Attentive, Sharing and Supportive  Affect:  Appropriate  Cognitive:  Alert, Appropriate and Oriented  Insight:  Engaged  Engagement in Therapy:  Engaged  Modes of Intervention:   Education, Support and Processing, Activity  Ambrose Mantle, LCSW 04/16/2015

## 2015-04-16 NOTE — Progress Notes (Signed)
D) Pt has been attending the groups and interacting with her peers. Stated that she was feeling a little unsteady on her feet and began using the wheel chair on and off. Affect is flat and at times Pt tends to withdrawn into herself. Was able to state that she is really a nice person and that she has a lot of needs. Rates her depression at an 8, hopelessness at a 9 and her anxiety at a 7. Admits to SI but contracts for safety while on the unit. A) Pt provided with a 1;1. Encouragement given. Praised for her willingness to come to the groups and to interact in the dayroom. Went to the [employer] and was playing the piano and was singing. R) Pt remains depressed and is having SI. Contracts for her safety while on the unit.

## 2015-04-17 ENCOUNTER — Telehealth (HOSPITAL_COMMUNITY): Payer: Self-pay | Admitting: Psychiatry

## 2015-04-17 MED ORDER — CLONAZEPAM 1 MG PO TABS: 1.0000 mg | ORAL_TABLET | Freq: Three times a day (TID) | ORAL | Status: DC | PRN

## 2015-04-17 MED ORDER — VENLAFAXINE HCL ER 75 MG PO CP24: 75.0000 mg | ORAL_CAPSULE | Freq: Every day | ORAL | Status: DC

## 2015-04-17 MED ORDER — NICOTINE 21 MG/24HR TD PT24: 21.0000 mg | MEDICATED_PATCH | Freq: Every day | TRANSDERMAL | Status: DC

## 2015-04-17 MED ORDER — TOPIRAMATE 25 MG PO TABS: ORAL_TABLET | ORAL | Status: DC

## 2015-04-17 MED ORDER — PRAZOSIN HCL 1 MG PO CAPS: 1.0000 mg | ORAL_CAPSULE | Freq: Every day | ORAL | Status: DC

## 2015-04-17 MED ORDER — LAMOTRIGINE 25 MG PO TABS: 75.0000 mg | ORAL_TABLET | Freq: Every day | ORAL | Status: DC

## 2015-04-17 MED ORDER — TRAZODONE HCL 50 MG PO TABS: 50.0000 mg | ORAL_TABLET | Freq: Every day | ORAL | Status: DC

## 2015-04-17 MED ORDER — TRAZODONE HCL 50 MG PO TABS
50.0000 mg | ORAL_TABLET | Freq: Every day | ORAL | Status: DC
  Filled 2015-04-17 (×2): qty 1

## 2015-04-17 MED ORDER — GABAPENTIN 100 MG PO CAPS: 200.0000 mg | ORAL_CAPSULE | Freq: Three times a day (TID) | ORAL | Status: DC

## 2015-04-17 NOTE — Progress Notes (Signed)
  [facility] Adult Case Management Discharge Plan :  Will you be returning to the same living situation after discharge:  Yes,  Pt returning home with mother At discharge, do you have transportation home?: Yes,  pt's mother to provide transportation Do you have the ability to pay for your medications: Yes,  Pt provided with 30-day prescriptions  Release of information consent forms completed and in the chart;  Patient's signature needed at discharge.  Patient to Follow up at: Follow-up Information    Follow up with [facility].   Specialty:  Behavioral Health   Why:  This agency requires that the patient schedule the first appointment. Please call the number below the day following your discharge to schedule this appointment   Contact information:   [address] [place] [address] [phone]       Patient denies SI/HI: Yes,  Pt denies    Safety Planning and Suicide Prevention discussed: Yes,  with Pt; SPE contact attempts made  Have you used any form of tobacco in the last 30 days? (Cigarettes, Smokeless Tobacco, Cigars, and/or Pipes): Yes  Has patient been referred to the Quitline?: Patient refused referral  Elaina Hoops 04/17/2015, 1:30 PM

## 2015-04-17 NOTE — BHH Suicide Risk Assessment (Signed)
[facility] Discharge Suicide Risk Assessment   Demographic Factors:  29 year old single female, living with mother. Employed   Total Time spent with patient: 30 minutes  Musculoskeletal: Strength & Muscle Tone: within normal limits Gait & Station: normal Patient leans: N/A  Psychiatric Specialty Exam: Physical Exam  ROS  Blood pressure 130/82, pulse 113, temperature 97.4 F (36.3 C), temperature source Oral, resp. rate 20, height  (1.702 m), weight 370 lb (167.831 kg).Body mass index is 57.94 kg/(m^2).  General Appearance: Well Groomed  Eye Contact::  Good  Speech:  Normal Rate409  Volume:  Normal  Mood:  still depressed but better today, and improved compared to admission  Affect:  Appropriate and reactive   Thought Process:  Linear  Orientation:  Full (Time, Place, and Person)  Thought Content:  no hallucinations, no delusions  Suicidal Thoughts:  No- denies any thoughts of hurting self or of SI at this time  Homicidal Thoughts:  No  Memory:  recent and remote grossly intact   Judgement:  Other:  improved   Insight:  improved  Psychomotor Activity:  Normal  Concentration:  Good  Recall:  Good  Fund of Knowledge:Good  Language: Good  Akathisia:  Negative  Handed:  Right  AIMS (if indicated):     Assets:  Communication Skills Desire for Improvement Resilience  Sleep:  Number of Hours: 6  Cognition: WNL  ADL's:  Improved -    Have you used any form of tobacco in the last 30 days? (Cigarettes, Smokeless Tobacco, Cigars, and/or Pipes): Yes  Has this patient used any form of tobacco in the last 30 days? (Cigarettes, Smokeless Tobacco, Cigars, and/or Pipes) Yes, A prescription for an FDA-approved tobacco cessation medication was offered at discharge and the patient refused  Mental Status Per Nursing Assessment::   On Admission:  Suicidal ideation indicated by patient, Plan includes specific time, place, or method, Self-harm behaviors, Intention to act on suicide plan,  Belief that plan would result in death  Current Mental Status by Physician: At this time patient is improved, with partially but significantly improved compared to admission- affect is more reactive, no thought disorder, no SI or HI, no psychotic symptoms, future oriented .  Loss Factors: Mother is going to be moving out , remarrying soon- stressful job   Historical Factors: (+) prior psychiatric admissions for depression, no prior history of suicide attempts   Risk Reduction Factors:   Sense of responsibility to family, Religious beliefs about death, Employed, Living with another person, especially a relative and Positive coping skills or problem solving skills  Continued Clinical Symptoms:  As noted, patient improved compared to admission and currently denies any SI or HI, and is future oriented, states her boyfriend will moved in with her after her mother marries, leaves, and this will provide her with ongoing support   Cognitive Features That Contribute To Risk:  No gross cognitive deficits noted upon discharge. Is alert , attentive, and oriented x 3   Suicide Risk:  Mild:  Suicidal ideation of limited frequency, intensity, duration, and specificity.  There are no identifiable plans, no associated intent, mild dysphoria and related symptoms, good self-control (both objective and subjective assessment), few other risk factors, and identifiable protective factors, including available and accessible social support.  Principal Problem: Bipolar I disorder, most recent episode depressed Discharge Diagnoses:  Patient Active Problem List   Diagnosis Date Noted  . Opiate abuse, episodic [F11.10] 04/09/2015  . MDD (major depressive disorder), recurrent episode, severe [  F33.2] 04/09/2015  . Suicide attempt by other tranquilizer drug overdose [T43.592A] 04/09/2015  . 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 Dysmenorrhea [N94.6] 07/06/2013  . Dysmenorrhea  [N94.6] 06/09/2013  . Menorrhagia with regular cycle [N92.0] 06/09/2013  . Leg edema [R60.0] 10/03/2011  . Edema leg [R60.0] 10/03/2011  . Bipolar I disorder, most recent episode depressed [F31.30] 09/26/2011  . Morbid obesity [E66.01] 09/26/2011  . Weakness of both legs [R29.898] 09/04/2011  . Difficulty in walking(719.7) [R26.2] 09/04/2011  . 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   Specialty:  Behavioral Health   Why:  This agency requires that the patient schedule the first appointment. Please call the number below the day following your discharge to schedule this appointment   Contact information:   [address] [place] [address] [phone]       Plan Of Care/Follow-up recommendations:  Activity:  as tolerated  Diet:  Regular  Tests:  NA Other:  See below  Is patient on multiple antipsychotic therapies at discharge:  No   Has Patient had three or more failed trials of antipsychotic monotherapy by history:  No  Recommended Plan for Multiple Antipsychotic Therapies: NA  Patient is requesting discharge and there are no current grounds for involuntary commitment . She plans to return home. Follow up as above . Has an established PCP- Dr. Felecia Shelling, [facility] .    COBOS, FERNANDO 04/17/2015, 9:26 AM

## 2015-04-17 NOTE — Progress Notes (Signed)
Recreation Therapy Notes  Date: 09.19.2016 Time: 9:30am Location: [address] Group Room   Group Topic: Stress Management  Goal Area(s) Addresses:  Patient will actively participate in stress management techniques presented during session.   Behavioral Response: Did not attend.   Denise L Blanchfield, LRT/CTRS  Blanchfield, Denise L 04/17/2015 1:40 PM 

## 2015-04-17 NOTE — Tx Team (Signed)
Interdisciplinary Treatment Plan Update (Adult) Date: 04/17/2015   Date: 04/17/2015 1:27 PM  Progress in Treatment:  Attending groups: Yes  Participating in groups: Yes  Taking medication as prescribed: Yes  Tolerating medication: Yes  Family/Significant othe contact made: Yes with mother Patient understands diagnosis: Yes Discussing patient identified problems/goals with staff: Yes  Medical problems stabilized or resolved: Yes  Denies suicidal/homicidal ideation: Yes Patient has not harmed self or Others: Yes   New problem(s) identified: None identified at this time.   Discharge Plan or Barriers: Pt will return home and follow-up with outpatient providers.   Additional comments: n/a   Reason for Continuation of Hospitalization:  Depression Medication stabilization Suicidal ideation  Estimated length of stay: 2-3 days  Review of initial/current patient goals per problem list:   1.  Goal(s): Patient will participate in aftercare plan  Met:  Yes  Target date: 3-5 days from date of admission   As evidenced by: Patient will participate within aftercare plan AEB aftercare provider and housing plan at discharge being identified.   9/13: Pt will return home and follow-up with outpatient resources.  2.  Goal (s): Patient will exhibit decreased depressive symptoms and suicidal ideations.  Met:  Adequate for DC  Target date: 3-5 days from date of admission   As evidenced by: Patient will utilize self rating of depression at 3 or below and demonstrate decreased signs of depression or be deemed stable for discharge by MD.  9/13: Pt rates depression at 9/10 and continues to describe her mood as very depressed. Pt verbalizes her frustration that her suicide attempt was unsuccessful  9/15: Goal Progressing.   04/17/15: Per MD, Pt's symptoms are manageable in the outpatient setting despite Pt rating depression at 6/10.  3.  Goal(s): Patient will demonstrate decreased signs and  symptoms of anxiety.  Met:  Adequate for DC  Target date: 3-5 days from date of admission   As evidenced by: Patient will utilize self rating of anxiety at 3 or below and demonstrated decreased signs of anxiety, or be deemed stable for discharge by MD  9/13: Pt rates anxiety at 8/10 and presents as withdrawn at times.  9/15: Goal Progressing.  04/17/15: Per MD, Pt's symptoms are manageable in the outpatient setting despite Pt rating anxiety at 10/10.  Attendees: Patient:    Family:    Physician: Dr. Parke Poisson 04/17/2015 1:29 PM   Nursing: Allene Dillon, RN; Darrol Angel, RN; Franco Nones, RN 04/17/2015 1:29 PM   Clinical Social Worker: Tilden Fossa,  [place] 04/17/2015 1:29 PM   Other: Peri Maris 04/17/2015 1:29 PM   Other: Lucinda Dell, [place] Sessions Liaison 04/17/2015 1:29 PM   Other: Lars Pinks, Case Manager 04/17/2015 1:29 PM   Other: Agustina Caroli, NP 04/17/2015 1:29 PM   Other:    Other:    Other:     Peri Maris, [place] Social Work [phone]    [phone]

## 2015-04-17 NOTE — Progress Notes (Signed)
D: Patient alert and oriented x 4. Patient denies SI/HI/AVH. Patient reported having some knee pain 8/10. Patient was given tylenol 650 mg po per ordered dose. Patient talked with Clinical research associate about mother getting married and she would be alone. I reassured patient that once her mother got married, she would still be there for her.  A: Staff to monitor Q 15 mins for safety. Encouragement and support offered. Scheduled medications administered per orders. R: Patient remains safe on the unit. Patient attended group tonight. Patient visible on the unit and interacting with peers. Patient taking administered medications.

## 2015-04-17 NOTE — Progress Notes (Signed)
Patient ID: Nancy [name], femaRobi [birth year], 29 y.o.   MRN: [id number] Discharge Note- Spoke with Dr this am after speaking with Clinical research associate and told him and social worker she was ready to go. She had said this am to writer at morning med pass she was not. Now she is bright, verbal and expressing her excitement in leaving. Her mom is picking her up.She requested a Klonopin before lunch she says, to prepare for her discharge. She denies any thoughts to hurt self or others. She is happy to be going home. Reviewed with her her discharge plans, including her medications. She verbalized her understanding. All property has been returned to her.Peers cheered for her on discharge out of kindness. And she became tearful. Escorted to lobby where mom was waiting on her.

## 2015-04-17 NOTE — Discharge Summary (Signed)
Physician Discharge Summary Note  Patient:  Nancy [name] is an 29 y.o., female MRN:  [id number] DOB:  Aug 21, 1985 Patient phone:  [phone] (home)  Patient address:   [address] [place] [address],  Total Time spent with patient: Greater than 30 minutes  Date of Admission:  04/09/2015 Date of Discharge: 04-17-15  Reason for Admission:  Mood stabilization  Principal Problem: Bipolar I disorder, most recent episode depressed  Discharge Diagnoses: Patient Active Problem List   Diagnosis Date Noted  . Opiate abuse, episodic [F11.10] 04/09/2015  . MDD (major depressive disorder), recurrent episode, severe [F33.2] 04/09/2015  . Suicide attempt by other tranquilizer drug overdose [T43.592A] 04/09/2015  . 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 Dysmenorrhea [N94.6] 07/06/2013  . Dysmenorrhea [N94.6] 06/09/2013  . Menorrhagia with regular cycle [N92.0] 06/09/2013  . Leg edema [R60.0] 10/03/2011  . Edema leg [R60.0] 10/03/2011  . Bipolar I disorder, most recent episode depressed [F31.30] 09/26/2011  . Morbid obesity [E66.01] 09/26/2011  . Weakness of both legs [R29.898] 09/04/2011  . Difficulty in walking(719.7) [R26.2] 09/04/2011  . 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 Judgment and thought content normal. Her mood appears not anxious. Her affect is not angry, not blunt, not labile and not inappropriate. Cognition and memory are normal. She does not exhibit a depressed mood.    Review of Systems  Constitutional: Negative.   HENT: Negative.   Eyes: Negative.   Respiratory: Negative.   Cardiovascular: Negative.   Gastrointestinal: Negative.   Genitourinary: Negative.   Musculoskeletal: Negative.   Skin: Negative.   Neurological:  Negative.   Endo/Heme/Allergies: Negative.   Psychiatric/Behavioral: Positive for depression (Stable) and substance abuse (Opioid use disorder). Negative for suicidal ideas, hallucinations and memory loss. The patient has insomnia (Stable). The patient is not nervous/anxious.     Blood pressure 130/82, pulse 113, temperature 97.4 F (36.3 C), temperature source Oral, resp. rate 20, height  (1.702 m), weight 167.831 kg (370 lb).Body mass index is 57.94 kg/(m^2).  See Md's SRA   Have you used any form of tobacco in the last 30 days? (Cigarettes, Smokeless Tobacco, Cigars, and/or Pipes): Yes  Has this patient used any form of tobacco in the last 30 days? (Cigarettes, Smokeless Tobacco, Cigars, and/or Pipes): Yes, A prescription for an FDA-approved tobacco cessation medication was offered at discharge and the patient refused  Past Medical History:  Past Medical History  Diagnosis Date  . Bipolar disorder   . Tachycardia   . CIN I (cervical intraepithelial neoplasia I)   . OSA (obstructive sleep apnea)   . Anxiety   . Nephrolithiasis   . Enlarged heart     Past Surgical History  Procedure Laterality Date  . Bladder augmentation  2001    enlargement  . Dilitation & currettage/hystroscopy with thermachoice ablation N/A 07/06/2013    Procedure: DILATATION & CURETTAGE/HYSTEROSCOPY WITH THERMACHOICE ABLATION;  Surgeon: Tilda Burrow, MD;  Location: [facility];  Service: Gynecology;  Laterality: N/A;  total therapt time- 9 minutes 2 seconds; 87C  . Laparoscopic bilateral salpingectomy Bilateral 07/06/2013    Procedure: LAPAROSCOPIC BILATERAL SALPINGECTOMY;  Surgeon: Tilda Burrow, MD;  Location: [facility];  Service: Gynecology;  Laterality: Bilateral;   Family History:  Family History  Problem Relation Age of Onset  . Hypertension Mother   .  Depression Mother   . Diabetes Mother   . Cancer Mother     brain tumor  . Hypertension Father   . Diabetes Father   . Depression Father   .  Hypertension Sister   . Diabetes Sister   . Depression Sister   . Depression Brother   . Diabetes Brother   . Hypertension Brother   . Cancer Maternal Aunt   . Cancer Paternal Aunt   . Hypertension Maternal Grandmother   . Diabetes Maternal Grandmother   . Depression Maternal Grandmother   . Hypertension Maternal Grandfather   . Diabetes Maternal Grandfather   . Depression Maternal Grandfather   . Hypertension Paternal Grandmother   . Diabetes Paternal Grandmother   . Depression Paternal Grandmother   . Hypertension Paternal Grandfather   . Diabetes Paternal Grandfather   . Depression Paternal Grandfather    Social History:  History  Alcohol Use  . Yes    Comment: occasionally     History  Drug Use No    Social History   Social History  . Marital Status: Single    Spouse Name: N/A  . Number of Children: N/A  . Years of Education: N/A   Social History Main Topics  . Smoking status: Current Every Day Smoker -- 0.50 packs/day    Types: Cigarettes  . Smokeless tobacco: Never Used  . Alcohol Use: Yes     Comment: occasionally  . Drug Use: No  . Sexual Activity: Not Currently   Other Topics Concern  . None   Social History Narrative   Risk to Self: Is patient at risk for suicide?: Yes What has been your use of drugs/alcohol within the last 12 months?: Pt denies ETOH use; takes pain pills as needed  Risk to Others: No Prior Inpatient Therapy: Yes Prior Outpatient Therapy: Yes  Level of Care:  OP  Hospital Course:  Nancy [name] is an 29 y.o. female presented to [facility] after taking 25 Ambien pills but at that time reporting intention only to get rest. She described several symptoms of depression such as depressed mood, hopelessness, fatigue, insomnia, suicidal thoughts, and feeling worthless. Has been feeling suicidal for the past few months. Patient reported losing her job recently due to symptoms of depression. Patient stated the following during her  psychiatric assessment "I've been out of work for depression. I overdosed on [place]. I was so sure that I would die. I still wish I was dead. I stopped my medication four months ago. But I did get worse after that. 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 I spent time telling people that things will be fine and get better. I have a family history of Bipolar Disorder. I have that diagnoses. I get very happy and spend way too much money. I am very anxious about everything and always have been. I have some hope that I can get better."   Nancy [name] was admitted to the unit with her UDS test results positive for opioid. She was actively suicidal, was having thoughts of wanting to die & had taken an overdose of the Ambien tablets. Nancy [name] was also presenting with symptoms of worsening depression. She was in need of opioid detox as well as mood stabilization treatments. After admission assessment/evaluation, her presenting symptoms were detected & identified. The medication regimen targeting those symptoms were discussed & initiated. She received Clonidine detoxification treatment protocols to combat the withdrawal symptoms of opioid. She was also medicated & discharged on; Gabapentin 100 mg  for agitation, Lamictal 25 mg for mood stabilization, Topamax 25 mg for mood stabilization, prazosin 1 mg for nightmares, Klonopin 1 mg for anxiety, Effexor XR 75 mg for depression & Trazodone 50 mg for insomnia. She was enrolled & participated in the group counseling sessions being offered & held on this unit. She learned coping skills that should help her further to cope better & manage her depression/substance abuse issues after discharge. Part of her treatment regimen & discharge plans is referral to an outpatient clinic to continue psychiatric treatment.  Nancy [name] has completed detox treatment & her moodf is now stable. This is evidenced by her reports of improved mood, absence of suicidal ideations & substance withdrawal symptoms.  She is currently being discharged to continue medication management as noted below. She is provided with all the pertinent information needed to make this appointment without problems.  Upon discharge, Nancy [name] adamantly denies any SIHI, AVH, delusional thoughts, paranoia or substance withdrawal symptoms. She [facility] with all personal belongings in no apparent distress. Transportation per her arrangement.  Consults:  psychiatry  Significant Diagnostic Studies:  labs: CBC with diff, CMP, UDS, toxicology tests, U/A, results reviewed, stable  Discharge Vitals:   Blood pressure 130/82, pulse 113, temperature 97.4 F (36.3 C), temperature source Oral, resp. rate 20, height  (1.702 m), weight 167.831 kg (370 lb). Body mass index is 57.94 kg/(m^2). Lab Results:   No results found for this or any previous visit (from the past 72 hour(s)).  Physical Findings:  AIMS: Facial and Oral Movements Muscles of Facial Expression: None, normal Lips and Perioral Area: None, normal Jaw: None, normal Tongue: None, normal,Extremity Movements Upper (arms, wrists, hands, fingers): None, normal Lower (legs, knees, ankles, toes): None, normal, Trunk Movements Neck, shoulders, hips: None, normal, Overall Severity Severity of abnormal movements (highest score from questions above): None, normal Incapacitation due to abnormal movements: None, normal Patient's awareness of abnormal movements (rate only patient's report): No Awareness, Dental Status Current problems with teeth and/or dentures?: No Does patient usually wear dentures?: No   CIWA:  CIWA-Ar Total: 4  COWS:  COWS Total Score: 2   See Psychiatric Specialty Exam and Suicide Risk Assessment completed by Attending Physician prior to discharge.  Discharge destination:  Home  Is patient on multiple antipsychotic therapies at discharge:  No   Has Patient had three or more failed trials of antipsychotic monotherapy by history:  No  Recommended  Plan for Multiple Antipsychotic Therapies: NA    Medication List    STOP taking these medications        metFORMIN 500 MG tablet  Commonly known as:  GLUCOPHAGE     risperiDONE 0.5 MG tablet  Commonly known as:  RISPERDAL      TAKE these medications      Indication   clonazePAM 1 MG tablet  Commonly known as:  KLONOPIN  Take 1 tablet (1 mg total) by mouth 3 (three) times daily as needed (anxiety).   Indication:  Anxiety     gabapentin 100 MG capsule  Commonly known as:  NEURONTIN  Take 2 capsules (200 mg total) by mouth 3 (three) times daily. For agitation   Indication:  Agitation     lamoTRIgine 25 MG tablet  Commonly known as:  LAMICTAL  Take 3 tablets (75 mg total) by mouth daily. For mood control   Indication:  Mood control     nicotine 21 mg/24hr patch  Commonly known as:  NICODERM CQ - dosed in mg/24 hours  Place 1 patch (21 mg total) onto the skin daily. For smoking cesation   Indication:  Nicotine Addiction     prazosin 1 MG capsule  Commonly known as:  MINIPRESS  Take 1 capsule (1 mg total) by mouth at bedtime. For nightmares   Indication:  Nightmares     topiramate 25 MG tablet  Commonly known as:  TOPAMAX  Take 2 tablets (50 mg) daily & 3 tablets (75 mg) at bedtime: For mood stability   Indication:  Mood stability     traZODone 50 MG tablet  Commonly known as:  DESYREL  Take 1 tablet (50 mg total) by mouth at bedtime. For sleep   Indication:  Trouble Sleeping     venlafaxine XR 75 MG 24 hr capsule  Commonly known as:  EFFEXOR-XR  Take 1 capsule (75 mg total) by mouth daily with breakfast. For depression   Indication:  Major Depressive Disorder       Follow-up Information    Follow up with [facility].   Specialty:  Behavioral Health   Why:  This agency requires that the patient schedule the first appointment. Please call the number below the day following your discharge to schedule this appointment   Contact  information:   [address] [place] [address] [phone]      Follow-up recommendations: Activity:  As tolerated Diet: As recommended by your primary care doctor. Keep all scheduled follow-up appointments as recommended.   Comments: Take all your medications as prescribed by your mental healthcare provider. Report any adverse effects and or reactions from your medicines to your outpatient provider promptly. Patient is instructed and cautioned to not engage in alcohol and or illegal drug use while on prescription medicines. In the event of worsening symptoms, patient is instructed to call the crisis hotline, 911 and or go to the nearest ED for appropriate evaluation and treatment of symptoms. Follow-up with your primary care provider for your other medical issues, concerns and or health care needs.   Total Discharge Time: Greater than 30 minutes  Signed: Sanjuana Kava, PMHNP, FNP-BC 04/17/2015, 11:22 AM   Patient seen, Suicide Assessment Completed.  Disposition Plan Reviewed

## 2015-04-17 NOTE — [facility] Group Notes (Signed)
[facility] LCSW Aftercare Discharge Planning Group Note  04/17/2015 8:45 AM  Participation Quality: Alert, Appropriate and Oriented  Mood/Affect: Appropriate  Depression Rating:  6  Anxiety Rating: 10  Thoughts of Suicide: Pt denies SI/HI  Will you contract for safety? Yes  Current AVH: Pt denies  Plan for Discharge/Comments: Pt attended discharge planning group and actively participated in group. CSW discussed suicide prevention education with the group and encouraged them to discuss discharge planning and any relevant barriers. Pt presents as irritable due to conflict about missing contact and expresses being highly anxious regarding discharge.   Transportation Means: Pt reports access to transportation  Supports: Mother  Chad Cordial, Theresia Majors 04/17/2015 9:48 AM

## 2015-06-18 ENCOUNTER — Emergency Department (HOSPITAL_COMMUNITY)
Admission: EM | Admit: 2015-06-18 | Discharge: 2015-06-19 | Disposition: A | Discharge status: Home / Self Care | Payer: Managed Care, Other (non HMO) | Attending: Emergency Medicine | Admitting: Emergency Medicine

## 2015-06-18 ENCOUNTER — Encounter (HOSPITAL_COMMUNITY): Payer: Self-pay | Admitting: *Deleted

## 2015-06-18 ENCOUNTER — Emergency Department (HOSPITAL_COMMUNITY): Payer: Managed Care, Other (non HMO)

## 2015-06-18 DIAGNOSIS — R112 Nausea with vomiting, unspecified: Secondary | ICD-10-CM | POA: Diagnosis not present

## 2015-06-18 DIAGNOSIS — Z79899 Other long term (current) drug therapy: Secondary | ICD-10-CM | POA: Insufficient documentation

## 2015-06-18 DIAGNOSIS — Z8679 Personal history of other diseases of the circulatory system: Secondary | ICD-10-CM | POA: Diagnosis not present

## 2015-06-18 DIAGNOSIS — R109 Unspecified abdominal pain: Secondary | ICD-10-CM

## 2015-06-18 DIAGNOSIS — F319 Bipolar disorder, unspecified: Secondary | ICD-10-CM | POA: Insufficient documentation

## 2015-06-18 DIAGNOSIS — Z8742 Personal history of other diseases of the female genital tract: Secondary | ICD-10-CM | POA: Insufficient documentation

## 2015-06-18 DIAGNOSIS — F17210 Nicotine dependence, cigarettes, uncomplicated: Secondary | ICD-10-CM | POA: Diagnosis not present

## 2015-06-18 DIAGNOSIS — Z87442 Personal history of urinary calculi: Secondary | ICD-10-CM | POA: Insufficient documentation

## 2015-06-18 DIAGNOSIS — Z3202 Encounter for pregnancy test, result negative: Secondary | ICD-10-CM | POA: Diagnosis not present

## 2015-06-18 DIAGNOSIS — F419 Anxiety disorder, unspecified: Secondary | ICD-10-CM | POA: Insufficient documentation

## 2015-06-18 DIAGNOSIS — R6883 Chills (without fever): Secondary | ICD-10-CM | POA: Diagnosis not present

## 2015-06-18 DIAGNOSIS — R1012 Left upper quadrant pain: Secondary | ICD-10-CM | POA: Insufficient documentation

## 2015-06-18 LAB — URINALYSIS, ROUTINE W REFLEX MICROSCOPIC
BILIRUBIN URINE: NEGATIVE
GLUCOSE, UA: NEGATIVE mg/dL
Ketones, ur: NEGATIVE mg/dL
Leukocytes, UA: NEGATIVE
Nitrite: NEGATIVE
PH: 5.5 (ref 5.0–8.0)
Protein, ur: NEGATIVE mg/dL

## 2015-06-18 LAB — CBC WITH DIFFERENTIAL/PLATELET
Basophils Absolute: 0 10*3/uL (ref 0.0–0.1)
Basophils Relative: 0 %
EOS ABS: 0.2 10*3/uL (ref 0.0–0.7)
Eosinophils Relative: 2 %
HEMATOCRIT: 38.9 % (ref 36.0–46.0)
HEMOGLOBIN: 12.8 g/dL (ref 12.0–15.0)
LYMPHS ABS: 3.1 10*3/uL (ref 0.7–4.0)
Lymphocytes Relative: 48 %
MCH: 28.4 pg (ref 26.0–34.0)
MCHC: 32.9 g/dL (ref 30.0–36.0)
MCV: 86.4 fL (ref 78.0–100.0)
Monocytes Absolute: 0.5 10*3/uL (ref 0.1–1.0)
Monocytes Relative: 8 %
NEUTROS ABS: 2.7 10*3/uL (ref 1.7–7.7)
NEUTROS PCT: 41 %
Platelets: 234 10*3/uL (ref 150–400)
RBC: 4.5 MIL/uL (ref 3.87–5.11)
RDW: 12.8 % (ref 11.5–15.5)
WBC: 6.4 10*3/uL (ref 4.0–10.5)

## 2015-06-18 LAB — COMPREHENSIVE METABOLIC PANEL
ALBUMIN: 3.7 g/dL (ref 3.5–5.0)
ALK PHOS: 75 U/L (ref 38–126)
ALT: 23 U/L (ref 14–54)
AST: 21 U/L (ref 15–41)
Anion gap: 6 (ref 5–15)
BUN: 9 mg/dL (ref 6–20)
CALCIUM: 9.1 mg/dL (ref 8.9–10.3)
CO2: 25 mmol/L (ref 22–32)
CREATININE: 0.64 mg/dL (ref 0.44–1.00)
Chloride: 108 mmol/L (ref 101–111)
GFR calc non Af Amer: >60 mL/min (ref >60)
GLUCOSE: 111 mg/dL — AB (ref 65–99)
Potassium: 3.6 mmol/L (ref 3.5–5.1)
SODIUM: 139 mmol/L (ref 135–145)
Total Bilirubin: 0.5 mg/dL (ref 0.3–1.2)
Total Protein: 7.4 g/dL (ref 6.5–8.1)

## 2015-06-18 LAB — PREGNANCY, URINE: PREG TEST UR: NEGATIVE

## 2015-06-18 LAB — LIPASE, BLOOD: Lipase: 26 U/L (ref 11–51)

## 2015-06-18 LAB — URINE MICROSCOPIC-ADD ON

## 2015-06-18 MED ORDER — PANTOPRAZOLE SODIUM 40 MG IV SOLR
40.0000 mg | Freq: Once | INTRAVENOUS | Status: AC
  Administered 2015-06-18: 40 mg via INTRAVENOUS
  Filled 2015-06-18: qty 40

## 2015-06-18 MED ORDER — HYDROCODONE-ACETAMINOPHEN 5-325 MG PO TABS: 1.0000 | ORAL_TABLET | Freq: Four times a day (QID) | ORAL | Status: DC | PRN

## 2015-06-18 MED ORDER — ONDANSETRON HCL 4 MG/2ML IJ SOLN
4.0000 mg | Freq: Once | INTRAMUSCULAR | Status: AC
  Administered 2015-06-18: 4 mg via INTRAVENOUS
  Filled 2015-06-18: qty 2

## 2015-06-18 MED ORDER — ONDANSETRON 4 MG PO TBDP: ORAL_TABLET | ORAL | Status: DC

## 2015-06-18 MED ORDER — PANTOPRAZOLE SODIUM 20 MG PO TBEC: 20.0000 mg | DELAYED_RELEASE_TABLET | Freq: Every day | ORAL | Status: DC

## 2015-06-18 MED ORDER — SODIUM CHLORIDE 0.9 % IV BOLUS (SEPSIS)
500.0000 mL | Freq: Once | INTRAVENOUS | Status: AC
  Administered 2015-06-18: 500 mL via INTRAVENOUS

## 2015-06-18 MED ORDER — HYDROMORPHONE HCL 1 MG/ML IJ SOLN
0.5000 mg | Freq: Once | INTRAMUSCULAR | Status: AC
  Administered 2015-06-18: 0.5 mg via INTRAVENOUS
  Filled 2015-06-18: qty 1

## 2015-06-18 NOTE — ED Notes (Signed)
Pt requesting something else for nausea, states that the nausea and pain are both back again, pt refusing pain medication, Dr Estell HarpinZammit notified, additional orders given,

## 2015-06-18 NOTE — ED Provider Notes (Signed)
CSN: [id number]     Arrival date & time 06/18/15  1953 History  By signing my name below, I, Phillis Haggis, attest that this documentation has been prepared under the direction and in the presence of Bethann Berkshire, MD. Electronically Signed: Phillis Haggis, ED Scribe. 06/18/2015. 9:55 PM.   Chief Complaint  Patient presents with  . Abdominal Pain   Patient is a 29 y.o. female presenting with abdominal pain. The history is provided by the patient. No language interpreter was used.  Abdominal Pain Pain location:  LUQ Pain quality: aching   Pain radiates to:  Does not radiate Pain severity:  Moderate Onset quality:  Sudden Duration:  12 hours Timing:  Constant Progression:  Worsening Chronicity:  New Ineffective treatments:  None tried Associated symptoms: chills, nausea and vomiting   Associated symptoms: no chest pain, no cough, no diarrhea, no fatigue and no hematuria   HPI Comments: Nancy [name] is a 29 y.o. Female who presents to the Emergency Department complaining of gradually worsening, constant, aching, non-radiating LUQ abdominal tenderness occurring this morning. Pt states that she woke up with nausea and vomiting that has continued to worsen throughout the day, causing her abdominal pain. She reports associated chills. Pt denies worsening or alleviating symptoms, or trying anything for her pain. She denies chest pain, cough, diarrhea, fatigue or hematuria.   Past Medical History  Diagnosis Date  . Bipolar disorder (HCC)   . Tachycardia   . CIN I (cervical intraepithelial neoplasia I)   . OSA (obstructive sleep apnea)   . Anxiety   . Nephrolithiasis   . Enlarged heart    Past Surgical History  Procedure Laterality Date  . Bladder augmentation  2001    enlargement  . Dilitation & currettage/hystroscopy with thermachoice ablation N/A 07/06/2013    Procedure: DILATATION & CURETTAGE/HYSTEROSCOPY WITH THERMACHOICE ABLATION;  Surgeon: Tilda Burrow, MD;  Location: [facility];  Service: Gynecology;  Laterality: N/A;  total therapt time- 9 minutes 2 seconds; 87C  . Laparoscopic bilateral salpingectomy Bilateral 07/06/2013    Procedure: LAPAROSCOPIC BILATERAL SALPINGECTOMY;  Surgeon: Tilda Burrow, MD;  Location: [facility] ORS;  Service: Gynecology;  Laterality: Bilateral;   Family History  Problem Relation Age of Onset  . Hypertension Mother   . Depression Mother   . Diabetes Mother   . Cancer Mother     brain tumor  . Hypertension Father   . Diabetes Father   . Depression Father   . Hypertension Sister   . Diabetes Sister   . Depression Sister   . Depression Brother   . Diabetes Brother   . Hypertension Brother   . Cancer Maternal Aunt   . Cancer Paternal Aunt   . Hypertension Maternal Grandmother   . Diabetes Maternal Grandmother   . Depression Maternal Grandmother   . Hypertension Maternal Grandfather   . Diabetes Maternal Grandfather   . Depression Maternal Grandfather   . Hypertension Paternal Grandmother   . Diabetes Paternal Grandmother   . Depression Paternal Grandmother   . Hypertension Paternal Grandfather   . Diabetes Paternal Grandfather   . Depression Paternal Grandfather    Social History  Substance Use Topics  . Smoking status: Current Every Day Smoker -- 0.50 packs/day    Types: Cigarettes  . Smokeless tobacco: Never Used  . Alcohol Use: Yes     Comment: occasionally   OB History    No data available     Review of Systems  Constitutional: Positive for chills. Negative  for appetite change and fatigue.  HENT: Negative for congestion, ear discharge and sinus pressure.   Eyes: Negative for discharge.  Respiratory: Negative for cough.   Cardiovascular: Negative for chest pain.  Gastrointestinal: Positive for nausea and vomiting. Negative for abdominal pain and diarrhea.  Genitourinary: Negative for frequency and hematuria.  Musculoskeletal: Negative for back pain.  Skin: Negative for rash.  Neurological: Negative for  seizures and headaches.  Psychiatric/Behavioral: Negative for hallucinations.   Allergies  Asa buff (mag; Aspirin; and Tolmetin  Home Medications   Prior to Admission medications   Medication Sig Start Date End Date Taking? Authorizing Provider  clonazePAM (KLONOPIN) 1 MG tablet Take 1 tablet (1 mg total) by mouth 3 (three) times daily as needed (anxiety). 04/17/15   Sanjuana KavaAgnes I Nwoko, NP  gabapentin (NEURONTIN) 100 MG capsule Take 2 capsules (200 mg total) by mouth 3 (three) times daily. For agitation 04/17/15   Sanjuana KavaAgnes I Nwoko, NP  lamoTRIgine (LAMICTAL) 25 MG tablet Take 3 tablets (75 mg total) by mouth daily. For mood control 04/17/15   Sanjuana KavaAgnes I Nwoko, NP  nicotine (NICODERM CQ - DOSED IN MG/24 HOURS) 21 mg/24hr patch Place 1 patch (21 mg total) onto the skin daily. For smoking cesation 04/17/15   Sanjuana KavaAgnes I Nwoko, NP  prazosin (MINIPRESS) 1 MG capsule Take 1 capsule (1 mg total) by mouth at bedtime. For nightmares 04/17/15   Sanjuana KavaAgnes I Nwoko, NP  topiramate (TOPAMAX) 25 MG tablet Take 2 tablets (50 mg) daily & 3 tablets (75 mg) at bedtime: For mood stability 04/17/15   Sanjuana KavaAgnes I Nwoko, NP  traZODone (DESYREL) 50 MG tablet Take 1 tablet (50 mg total) by mouth at bedtime. For sleep 04/17/15   Sanjuana KavaAgnes I Nwoko, NP  venlafaxine XR (EFFEXOR-XR) 75 MG 24 hr capsule Take 1 capsule (75 mg total) by mouth daily with breakfast. For depression 04/17/15   Sanjuana KavaAgnes I Nwoko, NP   BP 150/102 mmHg  Pulse 93  Temp(Src) 98.3 F (36.8 C) (Oral)  Resp 20  Ht 5\' 7"  (1.702 m)  Wt 351 lb (159.213 kg)  BMI 54.96 kg/m2  SpO2 100% Physical Exam  Constitutional: She is oriented to person, place, and time. She appears well-developed.  HENT:  Head: Normocephalic.  Eyes: Conjunctivae and EOM are normal. No scleral icterus.  Neck: Neck supple. No thyromegaly present.  Cardiovascular: Normal rate and regular rhythm.  Exam reveals no gallop and no friction rub.   No murmur heard. Pulmonary/Chest: No stridor. She has no wheezes. She has  no rales. She exhibits no tenderness.  Abdominal: She exhibits no distension. There is tenderness in the left upper quadrant. There is no rebound.  Moderate LUQ tenderness  Musculoskeletal: Normal range of motion. She exhibits no edema.  Lymphadenopathy:    She has no cervical adenopathy.  Neurological: She is oriented to person, place, and time. She exhibits normal muscle tone. Coordination normal.  Skin: No rash noted. No erythema.  Psychiatric: She has a normal mood and affect. Her behavior is normal.    ED Course  Procedures (including critical care time) DIAGNOSTIC STUDIES: Oxygen Saturation is 100% on RA, normal by my interpretation.    COORDINATION OF CARE: 8:09 PM-Discussed treatment plan which includes labs with pt at bedside and pt agreed to plan.    Labs Review Labs Reviewed  COMPREHENSIVE METABOLIC PANEL - Abnormal; Notable for the following:    Glucose, Bld 111 (*)    All other components within normal limits  CBC WITH DIFFERENTIAL/PLATELET  LIPASE, BLOOD  URINALYSIS, ROUTINE W REFLEX MICROSCOPIC (NOT AT [facility])  PREGNANCY, URINE    Imaging Review No results found. I have personally reviewed and evaluated these images and lab results as part of my medical decision-making.   EKG Interpretation None      MDM   Final diagnoses:  None    The chart was scribed for me under my direct supervision.  I personally performed the history, physical, and medical decision making and all procedures in the evaluation of this patient.Bethann Berkshire, MD 07/05/15 [phone]

## 2015-06-18 NOTE — ED Notes (Signed)
Pt c/o left upper abd pain with n/v that started this am, denies any fevers, diarrhea, pain is worse with palpation of left upper quad area,

## 2015-06-18 NOTE — Discharge Instructions (Signed)
Follow up with the gi md dr. Kendell Banerourke in one week.

## 2015-06-19 IMAGING — CT CT ABD-PELV W/ CM
2 of 4 series · 16 of 46 positions shown, 18 images · IV contrast (Omnipaque 300)
Comparison: CT of the abdomen and pelvis performed [DATE]

CLINICAL DATA: Acute onset of left upper quadrant abdominal
tenderness, nausea and vomiting. Chills. Initial encounter.

EXAM:
CT ABDOMEN AND PELVIS WITH CONTRAST
TECHNIQUE: Multidetector CT imaging of the abdomen and pelvis was performed
using the standard protocol following bolus administration of
intravenous contrast.
CONTRAST:  100mL OMNIPAQUE IOHEXOL 300 MG/ML  SOLN

[Series 2: abd_pel_with 5.0 b40f · axial · 0.75mm/px · z∈[-304,+146]mm · 13 of 100 slices shown, 15 images]
[im 5/100  soft-tissue]
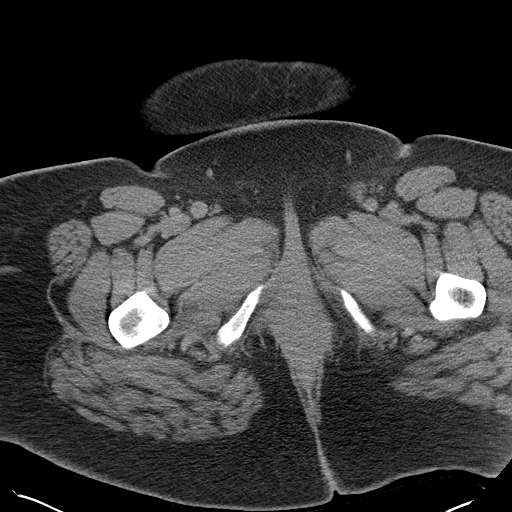
[im 5/100  bone]
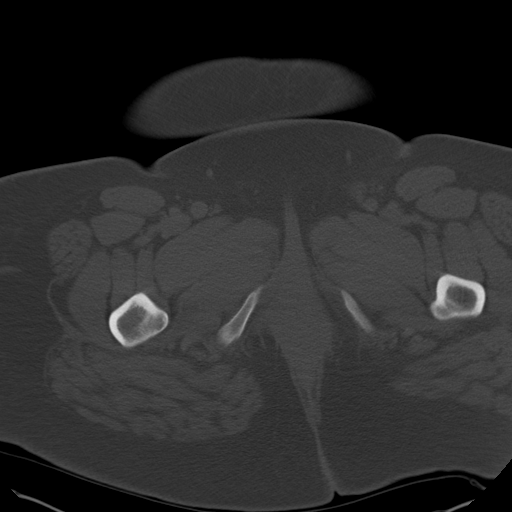
[im 13/100  soft-tissue]
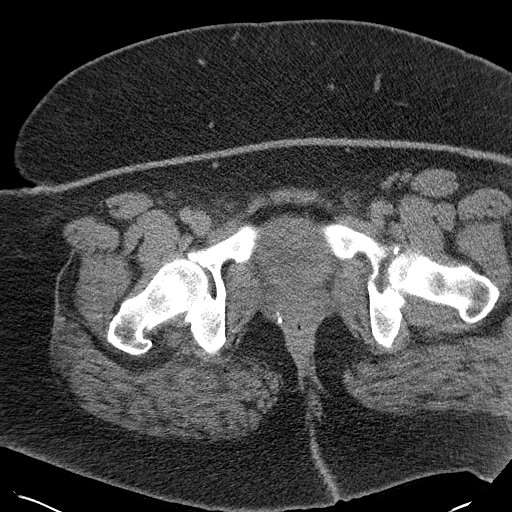
[im 22/100  soft-tissue]
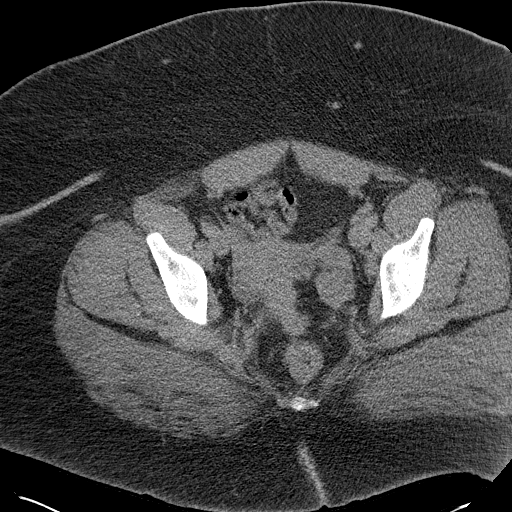
[im 26/100  soft-tissue]
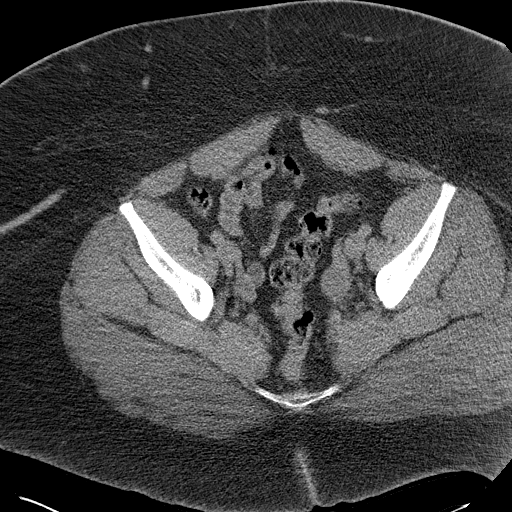
[im 35/100  soft-tissue]
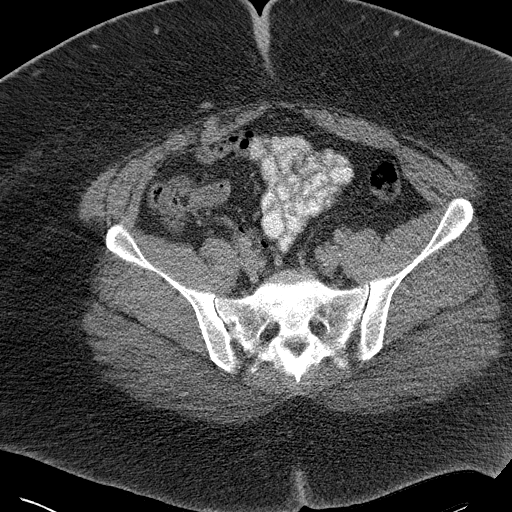
[im 44/100  soft-tissue]
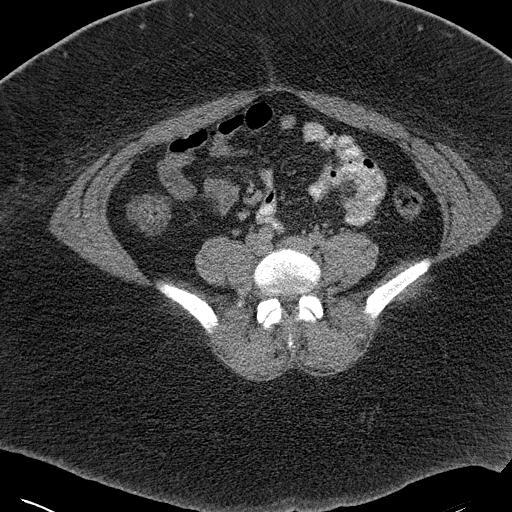
[im 52/100  soft-tissue]
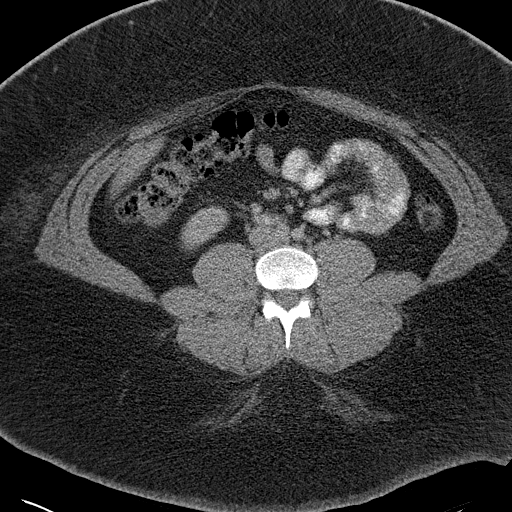
[im 56/100  soft-tissue]
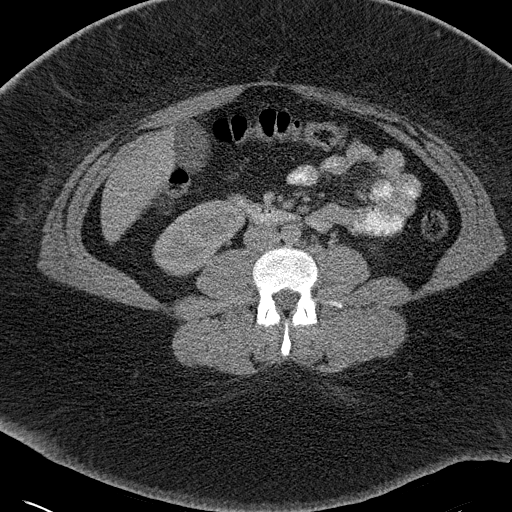
[im 65/100  soft-tissue]
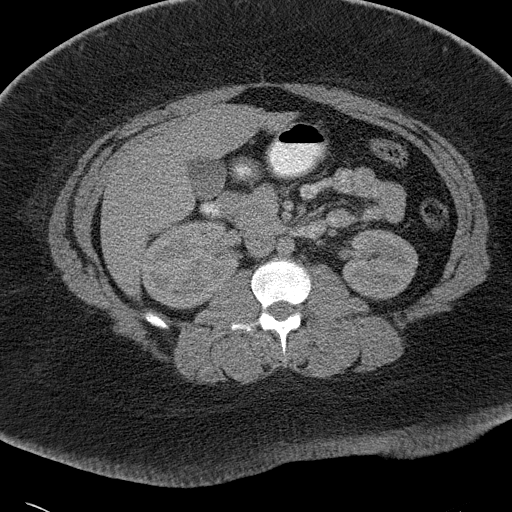
[im 65/100  bone]
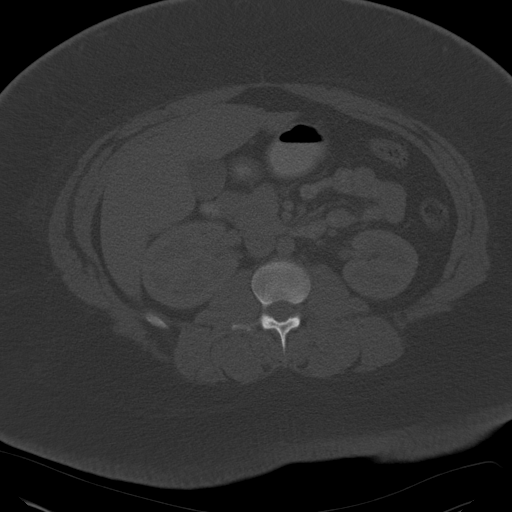
[im 74/100  soft-tissue]
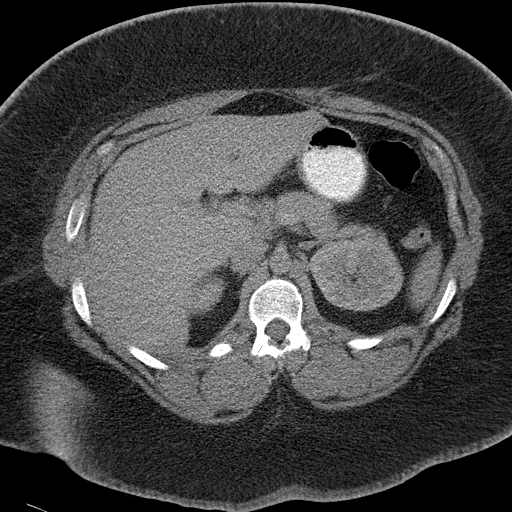
[im 78/100  soft-tissue]
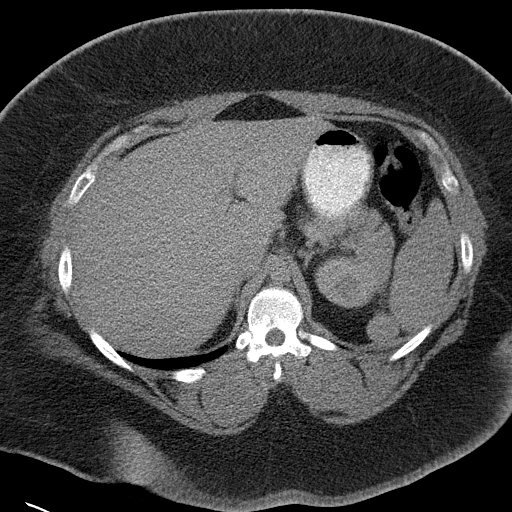
[im 87/100  soft-tissue]
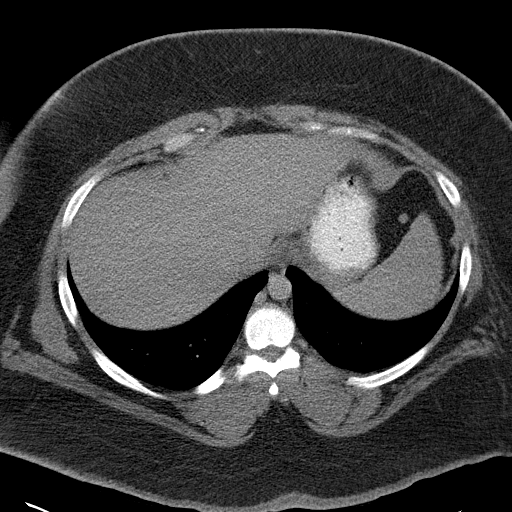
[im 95/100  soft-tissue]
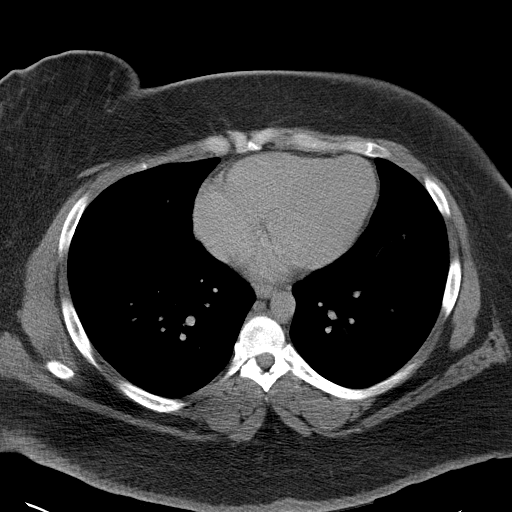

[Series 3: abd_pel_with 3.0 spo cor · coronal · 0.85mm/px · 3 of 135 slices shown]
[im 45/135  soft-tissue]
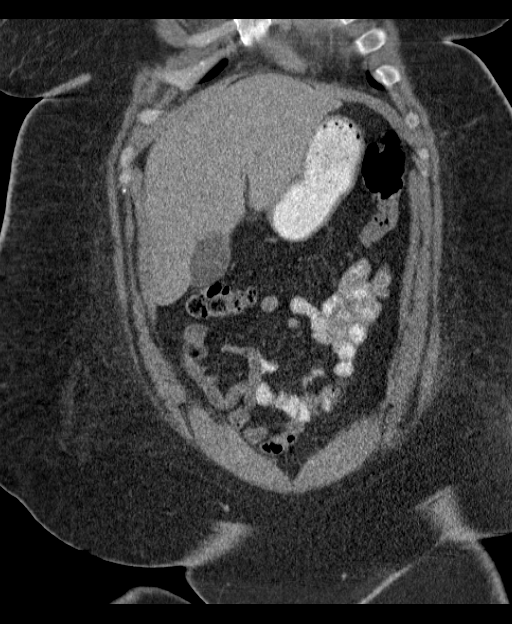
[im 60/135  soft-tissue]
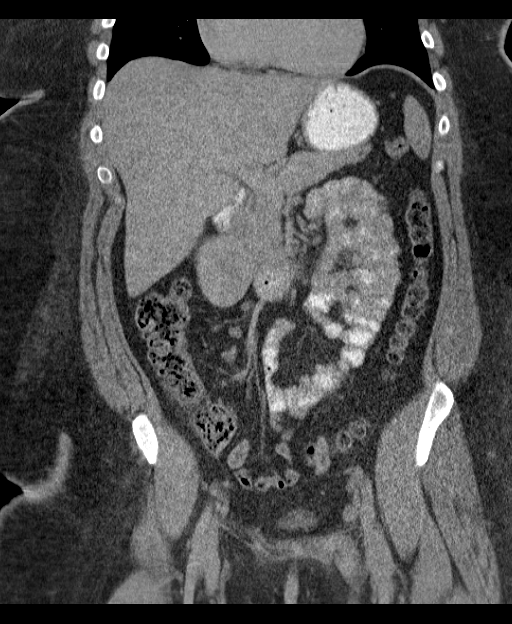
[im 75/135  soft-tissue]
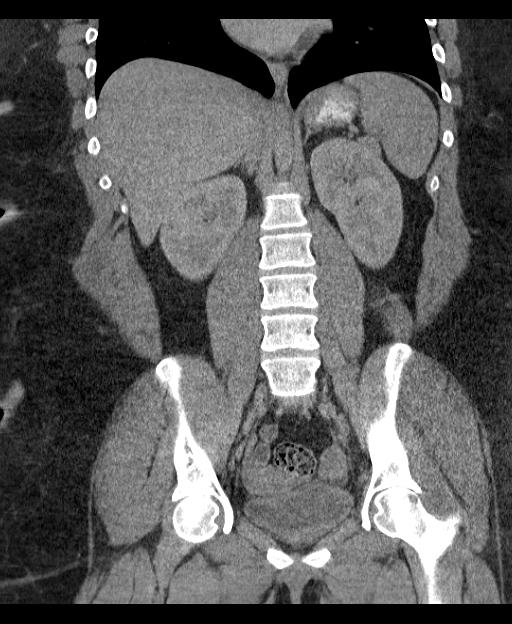

[16 of 46 positions shown; findings below may reference images not displayed]

FINDINGS: The visualized lung bases are clear.

The liver and spleen are unremarkable in appearance. The gallbladder
is within normal limits. The pancreas and adrenal glands are
unremarkable.

The kidneys are unremarkable in appearance. There is no evidence of
hydronephrosis. No renal or ureteral stones are seen. No perinephric
stranding is appreciated.

No free fluid is identified. The small bowel is unremarkable in
appearance. The stomach is within normal limits. No acute vascular
abnormalities are seen.

The appendix is normal in caliber, without evidence of appendicitis.
The colon is unremarkable in appearance.

The bladder is mildly distended and grossly unremarkable. The uterus
is unremarkable. The ovaries are grossly symmetric. No suspicious
adnexal masses are seen. No inguinal lymphadenopathy is seen.

No acute osseous abnormalities are identified.
IMPRESSION: Unremarkable contrast-enhanced CT of the abdomen and pelvis.

## 2015-06-19 MED ORDER — IOHEXOL 300 MG/ML  SOLN
100.0000 mL | Freq: Once | INTRAMUSCULAR | Status: AC | PRN
  Administered 2015-06-19: 100 mL via INTRAVENOUS

## 2015-06-19 MED ORDER — BARIUM SULFATE 2 % PO SUSP
450.0000 mL | Freq: Once | ORAL | Status: DC
Start: 2015-06-19 — End: 2015-06-19

## 2015-06-19 NOTE — ED Provider Notes (Signed)
[id number] he ovaries are grossly symmetric. No suspicious adnexal masses are seen. No inguinal lymphadenopathy is seen. No acute osseous abnormalities are identified. IMPRESSION: Unremarkable contrast-enhanced CT of the abdomen and pelvis. Electronically Signed   By: Jeffery  Chang M.D.   On: 06/19/2015 00:28    Diagnoses that have been ruled out:  None  Diagnoses that are still under consideration:  None  Final diagnoses:  Abdominal pain in female    New Prescriptions   HYDROCODONE-ACETAMINOPHEN  (NORCO/VICODIN) 5-325 MG TABLET    Take 1 tablet by mouth every 6 (six) hours as needed for moderate pain.   ONDANSETRON (ZOFRAN ODT) 4 MG DISINTEGRATING TABLET    4mg  ODT q4 hours prn nausea/vomit   PANTOPRAZOLE (PROTONIX) 20 MG TABLET    Take 1 tablet (20 mg total) by mouth daily.   Plan discharge  Anastaisa Wooding, MD, FACEP   Iva5 KnLevada Schilling MD 1 21/166 LevaAddison684666Levada SchillinglMauritAddison75 [address]67[facility]58tionLevada Schillingpital [phone] [address]9767-[address]-Ea45st LFulMaurita32niaeLevada Schilling763 3 [phone] EAddison81 [address]97644-3PFuMauritanRockey Situ91425Radiographer, therap utice5 HaLevada Schilling5ane1 vada SchillAddison52 [address]97FulMauritaniaer CRockey Situ9147-Radiographer, therap<42BADTLevada SchillingAG>ut e Haven5432Addison78 [address]67-34587[facility]56er AFulMauritaniaer Ca82075-229Levada Schilling[address]y Center3-6418Daryel GeraldaRockey Situdag418

## 2015-06-20 LAB — URINE CULTURE: Culture: 1000

## 2015-08-21 ENCOUNTER — Encounter (HOSPITAL_COMMUNITY): Payer: Self-pay

## 2015-08-21 ENCOUNTER — Emergency Department (HOSPITAL_COMMUNITY): Payer: Managed Care, Other (non HMO)

## 2015-08-21 ENCOUNTER — Emergency Department (HOSPITAL_COMMUNITY)
Admission: EM | Admit: 2015-08-21 | Discharge: 2015-08-21 | Disposition: A | Discharge status: Home / Self Care | Payer: Managed Care, Other (non HMO) | Attending: Emergency Medicine | Admitting: Emergency Medicine

## 2015-08-21 DIAGNOSIS — Y998 Other external cause status: Secondary | ICD-10-CM | POA: Insufficient documentation

## 2015-08-21 DIAGNOSIS — F319 Bipolar disorder, unspecified: Secondary | ICD-10-CM | POA: Insufficient documentation

## 2015-08-21 DIAGNOSIS — Z87448 Personal history of other diseases of urinary system: Secondary | ICD-10-CM | POA: Insufficient documentation

## 2015-08-21 DIAGNOSIS — F17210 Nicotine dependence, cigarettes, uncomplicated: Secondary | ICD-10-CM | POA: Insufficient documentation

## 2015-08-21 DIAGNOSIS — Z8669 Personal history of other diseases of the nervous system and sense organs: Secondary | ICD-10-CM | POA: Insufficient documentation

## 2015-08-21 DIAGNOSIS — Z87442 Personal history of urinary calculi: Secondary | ICD-10-CM | POA: Diagnosis not present

## 2015-08-21 DIAGNOSIS — Z79899 Other long term (current) drug therapy: Secondary | ICD-10-CM | POA: Diagnosis not present

## 2015-08-21 DIAGNOSIS — S8991XA Unspecified injury of right lower leg, initial encounter: Secondary | ICD-10-CM | POA: Diagnosis present

## 2015-08-21 DIAGNOSIS — F419 Anxiety disorder, unspecified: Secondary | ICD-10-CM | POA: Insufficient documentation

## 2015-08-21 DIAGNOSIS — Z8679 Personal history of other diseases of the circulatory system: Secondary | ICD-10-CM | POA: Insufficient documentation

## 2015-08-21 DIAGNOSIS — S8001XA Contusion of right knee, initial encounter: Secondary | ICD-10-CM

## 2015-08-21 DIAGNOSIS — Y9389 Activity, other specified: Secondary | ICD-10-CM | POA: Insufficient documentation

## 2015-08-21 DIAGNOSIS — W01198A Fall on same level from slipping, tripping and stumbling with subsequent striking against other object, initial encounter: Secondary | ICD-10-CM | POA: Diagnosis not present

## 2015-08-21 DIAGNOSIS — Y9289 Other specified places as the place of occurrence of the external cause: Secondary | ICD-10-CM | POA: Insufficient documentation

## 2015-08-21 IMAGING — DX DG KNEE COMPLETE 4+V*R*
4 series · 4 of 4 positions shown · non-contrast
Comparison: None.

CLINICAL DATA: Pain after fall.

EXAM:
RIGHT KNEE - COMPLETE 4+ VIEW

[knee ap (1 of 2)]
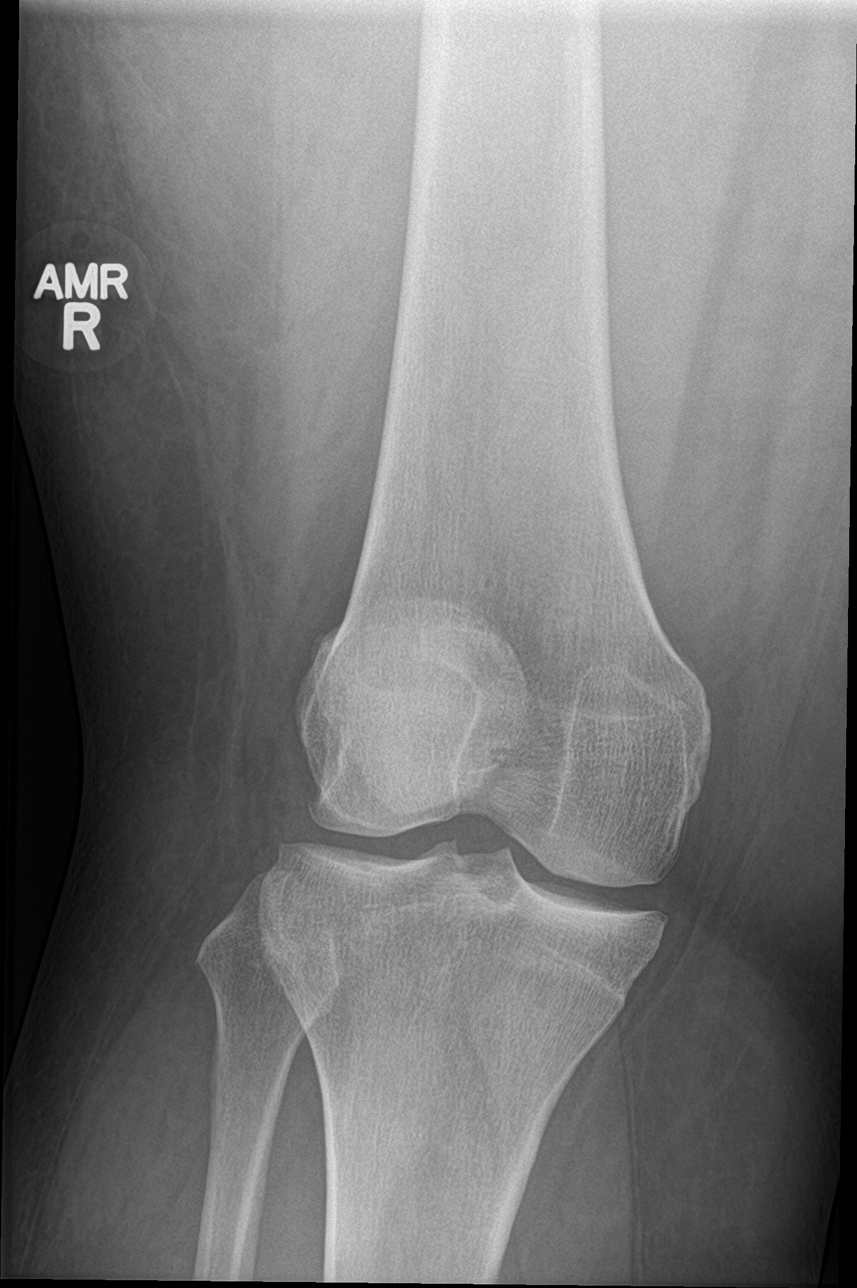

[tunnel]
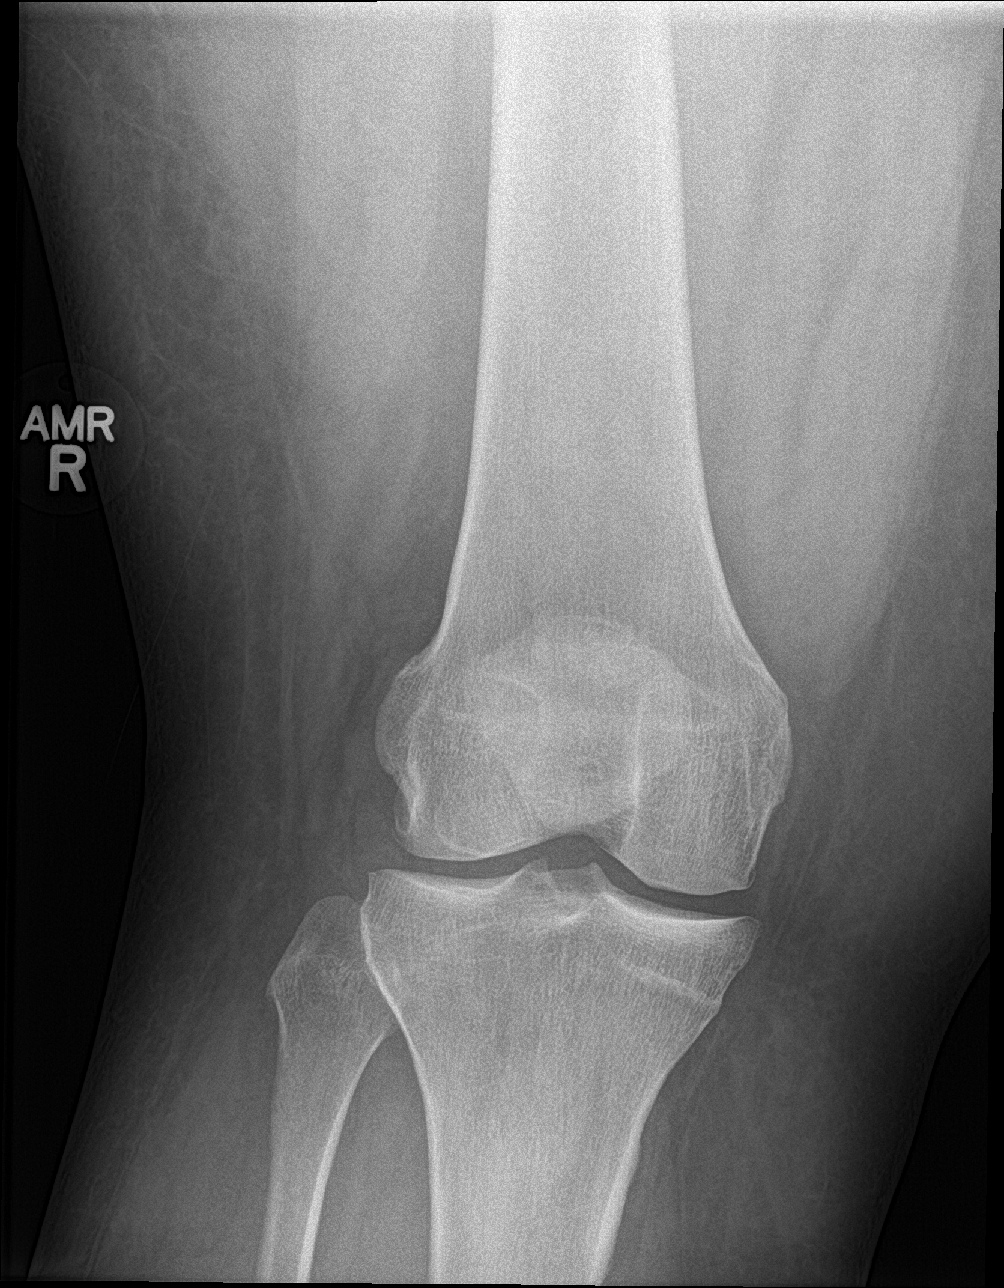

[knee lat]
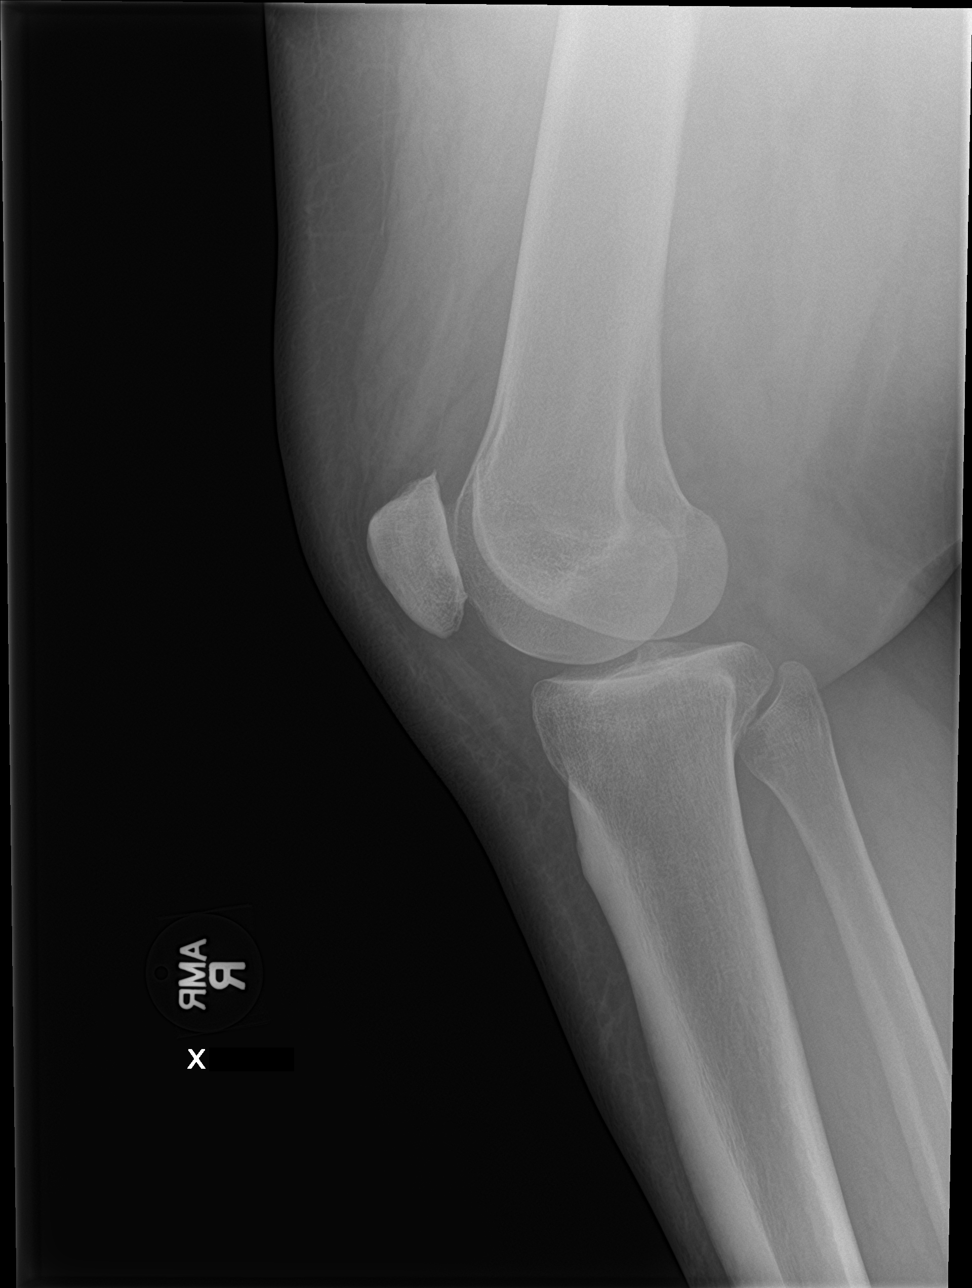

[knee ap (2 of 2)]
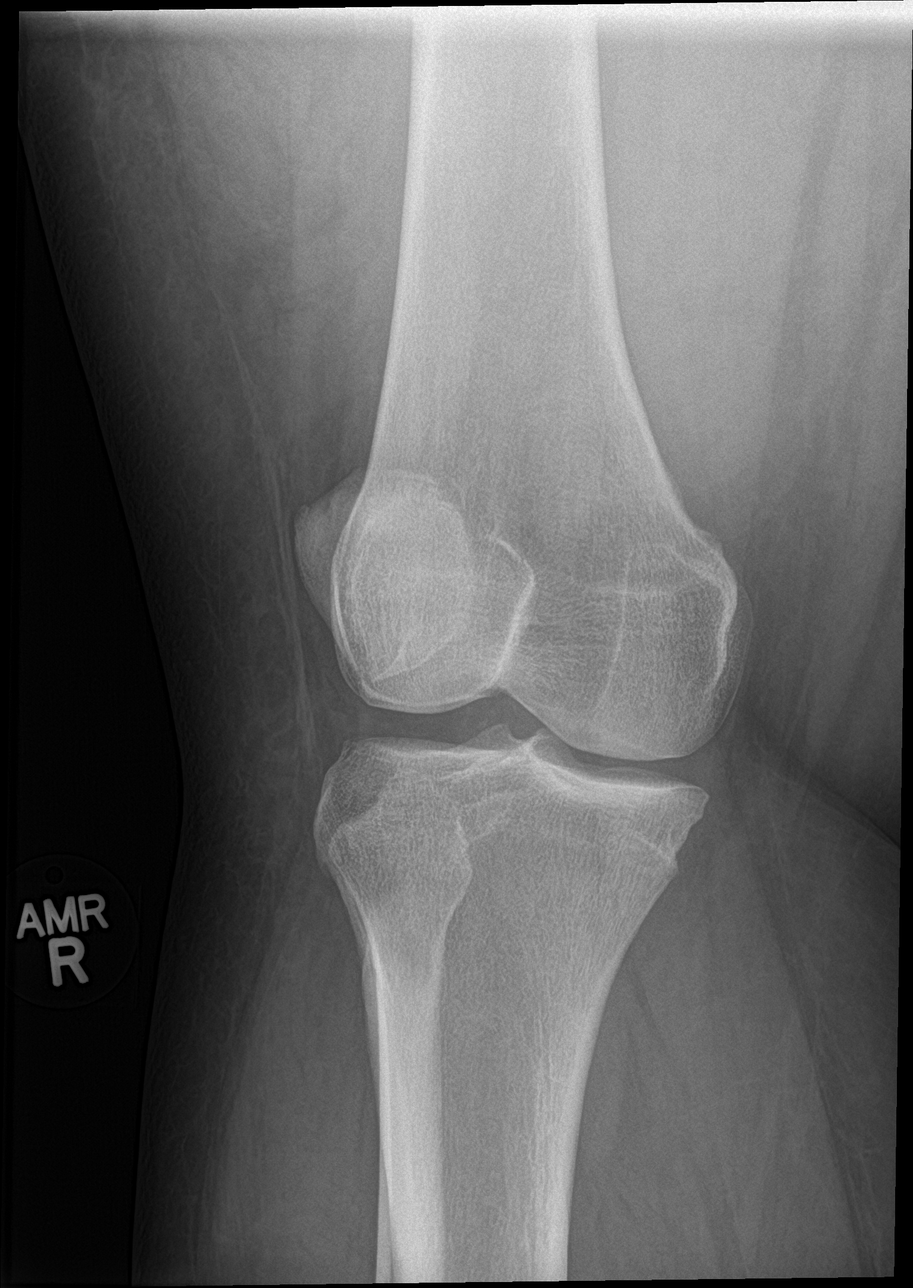

[4 of 4 positions shown; findings below may reference images not displayed]

FINDINGS: Minimal degenerative changes are seen in the patellofemoral
compartment. No fracture or dislocation. No joint effusion
identified.
IMPRESSION: No acute abnormality.

## 2015-08-21 MED ORDER — OXYCODONE-ACETAMINOPHEN 5-325 MG PO TABS
2.0000 | ORAL_TABLET | Freq: Once | ORAL | Status: AC
  Administered 2015-08-21: 2 via ORAL
  Filled 2015-08-21: qty 2

## 2015-08-21 MED ORDER — HYDROMORPHONE HCL 1 MG/ML IJ SOLN
1.0000 mg | Freq: Once | INTRAMUSCULAR | Status: AC
  Administered 2015-08-21: 1 mg via INTRAMUSCULAR
  Filled 2015-08-21: qty 1

## 2015-08-21 NOTE — ED Notes (Signed)
Pt c/o pain, EDP notified.  

## 2015-08-21 NOTE — ED Notes (Signed)
Pt states she feel off her carport. Complain of pain in her right leg from knee down

## 2015-08-21 NOTE — Discharge Instructions (Signed)
Your x-rays today do not show any concerning abnormalities.   Contusion A contusion is a deep bruise. Contusions happen when an injury causes bleeding under the skin. Symptoms of bruising include pain, swelling, and discolored skin. The skin may turn blue, purple, or yellow. HOME CARE   Rest the injured area.  If told, put ice on the injured area.  Put ice in a plastic bag.  Place a towel between your skin and the bag.  Leave the ice on for 20 minutes, 2-3 times per day.  If told, put light pressure (compression) on the injured area using an elastic bandage. Make sure the bandage is not too tight. Remove it and put it back on as told by your doctor.  If possible, raise (elevate) the injured area above the level of your heart while you are sitting or lying down.  Take over-the-counter and prescription medicines only as told by your doctor. GET HELP IF:  Your symptoms do not get better after several days of treatment.  Your symptoms get worse.  You have trouble moving the injured area. GET HELP RIGHT AWAY IF:   You have very bad pain.  You have a loss of feeling (numbness) in a hand or foot.  Your hand or foot turns pale or cold.   This information is not intended to replace advice given to you by your health care provider. Make sure you discuss any questions you have with your health care provider.   Document Released: 01/01/2008 Document Revised: 04/05/2015 Document Reviewed: 11/30/2014 Elsevier Interactive Patient Education [employer]

## 2015-08-21 NOTE — ED Provider Notes (Signed)
CSN: [id number]     Arrival date & time 08/21/15  1440 History   First MD Initiated Contact with Patient 08/21/15 1514     Chief Complaint  Patient presents with  . Knee Injury     (Consider location/radiation/quality/duration/timing/severity/associated sxs/prior Treatment) HPI   30 year old female with right knee pain. Mechanical fall this prior to arrival. Patient slipped in her carport and fell onto concrete service. She did strike her head but denies any significant headache, neck or back pain. She's had persistent knee pain since her fall. She cannot fully bear weight on it. No acute numbness or tingling. No intervention prior to arrival.  Past Medical History  Diagnosis Date  . Bipolar disorder (HCC)   . Tachycardia   . CIN I (cervical intraepithelial neoplasia I)   . OSA (obstructive sleep apnea)   . Anxiety   . Nephrolithiasis   . Enlarged heart    Past Surgical History  Procedure Laterality Date  . Bladder augmentation  2001    enlargement  . Dilitation & currettage/hystroscopy with thermachoice ablation N/A 07/06/2013    Procedure: DILATATION & CURETTAGE/HYSTEROSCOPY WITH THERMACHOICE ABLATION;  Surgeon: Tilda Burrow, MD;  Location: [facility];  Service: Gynecology;  Laterality: N/A;  total therapt time- 9 minutes 2 seconds; 87C  . Laparoscopic bilateral salpingectomy Bilateral 07/06/2013    Procedure: LAPAROSCOPIC BILATERAL SALPINGECTOMY;  Surgeon: Tilda Burrow, MD;  Location: [facility];  Service: Gynecology;  Laterality: Bilateral;   Family History  Problem Relation Age of Onset  . Hypertension Mother   . Depression Mother   . Diabetes Mother   . Cancer Mother     brain tumor  . Hypertension Father   . Diabetes Father   . Depression Father   . Hypertension Sister   . Diabetes Sister   . Depression Sister   . Depression Brother   . Diabetes Brother   . Hypertension Brother   . Cancer Maternal Aunt   . Cancer Paternal Aunt   . Hypertension Maternal  Grandmother   . Diabetes Maternal Grandmother   . Depression Maternal Grandmother   . Hypertension Maternal Grandfather   . Diabetes Maternal Grandfather   . Depression Maternal Grandfather   . Hypertension Paternal Grandmother   . Diabetes Paternal Grandmother   . Depression Paternal Grandmother   . Hypertension Paternal Grandfather   . Diabetes Paternal Grandfather   . Depression Paternal Grandfather    Social History  Substance Use Topics  . Smoking status: Current Every Day Smoker -- 0.50 packs/day    Types: Cigarettes  . Smokeless tobacco: Never Used  . Alcohol Use: Yes     Comment: occasionally   OB History    No data available     Review of Systems  All systems reviewed and negative, other than as noted in HPI.   Allergies  Asa buff (mag; Aspirin; and Tolmetin  Home Medications   Prior to Admission medications   Medication Sig Start Date End Date Taking? Authorizing Provider  acetaminophen (TYLENOL) 500 MG tablet Take 1,500 mg by mouth every 6 (six) hours as needed for mild pain.   Yes Historical Provider, MD  ALPRAZolam Prudy Feeler) 0.5 MG tablet Take 0.5 mg by mouth 3 (three) times daily.    Yes Historical Provider, MD  gabapentin (NEURONTIN) 100 MG capsule Take 2 capsules (200 mg total) by mouth 3 (three) times daily. For agitation 04/17/15  Yes Sanjuana Kava, NP  lamoTRIgine (LAMICTAL) 25 MG tablet Take 3 tablets (75  mg total) by mouth daily. For mood control Patient taking differently: Take 150 mg by mouth at bedtime. For mood control 04/17/15  Yes Sanjuana Kava, NP  lurasidone (LATUDA) 20 MG TABS tablet Take 20 mg by mouth at bedtime.   Yes Historical Provider, MD  paliperidone (INVEGA) 6 MG 24 hr tablet Take 6 mg by mouth at bedtime.   Yes Historical Provider, MD  prazosin (MINIPRESS) 1 MG capsule Take 1 capsule (1 mg total) by mouth at bedtime. For nightmares 04/17/15  Yes Sanjuana Kava, NP  topiramate (TOPAMAX) 25 MG tablet Take 2 tablets (50 mg) daily & 3 tablets  (75 mg) at bedtime: For mood stability Patient taking differently: Take 125 mg by mouth at bedtime.  04/17/15  Yes Sanjuana Kava, NP  traZODone (DESYREL) 50 MG tablet Take 1 tablet (50 mg total) by mouth at bedtime. For sleep 04/17/15  Yes Sanjuana Kava, NP  venlafaxine XR (EFFEXOR-XR) 150 MG 24 hr capsule Take 150 mg by mouth at bedtime.   Yes Historical Provider, MD  clonazePAM (KLONOPIN) 1 MG tablet Take 1 tablet (1 mg total) by mouth 3 (three) times daily as needed (anxiety). Patient not taking: Reported on 08/21/2015 04/17/15   Sanjuana Kava, NP  HYDROcodone-acetaminophen (NORCO/VICODIN) 5-325 MG tablet Take 1 tablet by mouth every 6 (six) hours as needed for moderate pain. Patient not taking: Reported on 08/21/2015 06/18/15   Bethann Berkshire, MD  nicotine (NICODERM CQ - DOSED IN MG/24 HOURS) 21 mg/24hr patch Place 1 patch (21 mg total) onto the skin daily. For smoking cesation Patient not taking: Reported on 08/21/2015 04/17/15   Sanjuana Kava, NP  ondansetron (ZOFRAN ODT) 4 MG disintegrating tablet  ODT q4 hours prn nausea/vomit Patient not taking: Reported on 08/21/2015 06/18/15   Bethann Berkshire, MD  pantoprazole (PROTONIX) 20 MG tablet Take 1 tablet (20 mg total) by mouth daily. Patient not taking: Reported on 08/21/2015 06/18/15   Bethann Berkshire, MD  venlafaxine XR (EFFEXOR-XR) 75 MG 24 hr capsule Take 1 capsule (75 mg total) by mouth daily with breakfast. For depression Patient not taking: Reported on 08/21/2015 04/17/15   Sanjuana Kava, NP   BP 142/85 mmHg  Pulse 93  Temp(Src) 98 F (36.7 C) (Oral)  Resp 18  Ht  (1.702 m)  Wt 360 lb (163.295 kg)  BMI 56.37 kg/m2  SpO2 99% Physical Exam  Constitutional: She appears well-developed and well-nourished. No distress.  HENT:  Head: Normocephalic and atraumatic.  Eyes: Conjunctivae are normal. Right eye exhibits no discharge. Left eye exhibits no discharge.  Neck: Neck supple.  Cardiovascular: Normal rate, regular rhythm and normal heart  sounds.  Exam reveals no gallop and no friction rub.   No murmur heard. Pulmonary/Chest: Effort normal and breath sounds normal. No respiratory distress.  Abdominal: Soft. She exhibits no distension. There is no tenderness.  Musculoskeletal: She exhibits no edema or tenderness.  Exam is limited secondary to body habitus. Right knee is fairly unremarkable in appearance and appears roughly symmetric as compared to the left. No obvious deformity. No effusion. Severe pain with both active and passive range of motion of the knee. Tenderness diffusely but worse on the lateral aspect. Neurovascular intact distally.  Neurological: She is alert.  Skin: Skin is warm and dry.  Psychiatric: She has a normal mood and affect. Her behavior is normal. Thought content normal.  Nursing note and vitals reviewed.   ED Course  Procedures (including critical care time) Labs  Review Labs Reviewed - No data to display  Imaging Review Dg Knee Complete 4 Views Right  08/21/2015  CLINICAL DATA:  Pain after fall. EXAM: RIGHT KNEE - COMPLETE 4+ VIEW COMPARISON:  None. FINDINGS: Minimal degenerative changes are seen in the patellofemoral compartment. No fracture or dislocation. No joint effusion identified. IMPRESSION: No acute abnormality. Electronically Signed   By: Gerome Sam III M.D   On: 08/21/2015 16:04   I have personally reviewed and evaluated these images and lab results as part of my medical decision-making.   EKG Interpretation None      MDM   Final diagnoses:  Knee contusion, right, initial encounter    30 year old female with right knee pain after mechanical fall. Per review of records, patient has a past history of intentional drug overdose and UDs positive for opiates.  No objective findings on imaging. Cannot justify prescribing further opiates at this time. RICE therapy. Crutches as needed for comfort. Outpt FU as needed.    Raeford Razor, MD 08/21/15 [phone]

## 2015-08-21 NOTE — ED Notes (Signed)
Orders for crutches d/c'd due to not having correct size here, pt. States she has a cane at home, EDP aware.

## 2016-08-13 ENCOUNTER — Emergency Department (HOSPITAL_COMMUNITY)
Admission: EM | Admit: 2016-08-13 | Discharge: 2016-08-13 | Disposition: A | Discharge status: Home / Self Care | Payer: Self-pay | Attending: Emergency Medicine | Admitting: Emergency Medicine

## 2016-08-13 ENCOUNTER — Encounter (HOSPITAL_COMMUNITY): Payer: Self-pay

## 2016-08-13 ENCOUNTER — Emergency Department (HOSPITAL_COMMUNITY): Payer: Self-pay

## 2016-08-13 DIAGNOSIS — R109 Unspecified abdominal pain: Secondary | ICD-10-CM

## 2016-08-13 DIAGNOSIS — F17210 Nicotine dependence, cigarettes, uncomplicated: Secondary | ICD-10-CM | POA: Insufficient documentation

## 2016-08-13 DIAGNOSIS — K5900 Constipation, unspecified: Secondary | ICD-10-CM | POA: Insufficient documentation

## 2016-08-13 DIAGNOSIS — R1012 Left upper quadrant pain: Secondary | ICD-10-CM | POA: Insufficient documentation

## 2016-08-13 DIAGNOSIS — Z79899 Other long term (current) drug therapy: Secondary | ICD-10-CM | POA: Insufficient documentation

## 2016-08-13 DIAGNOSIS — R110 Nausea: Secondary | ICD-10-CM | POA: Insufficient documentation

## 2016-08-13 LAB — I-STAT CHEM 8, ED
BUN: 4 mg/dL — AB (ref 6–20)
CALCIUM ION: 1.18 mmol/L (ref 1.15–1.40)
CREATININE: 0.5 mg/dL (ref 0.44–1.00)
Chloride: 103 mmol/L (ref 101–111)
Glucose, Bld: 161 mg/dL — ABNORMAL HIGH (ref 65–99)
HCT: 40 % (ref 36.0–46.0)
Hemoglobin: 13.6 g/dL (ref 12.0–15.0)
Potassium: 3.7 mmol/L (ref 3.5–5.1)
Sodium: 143 mmol/L (ref 135–145)
TCO2: 27 mmol/L (ref 0–100)

## 2016-08-13 IMAGING — DX DG ABDOMEN 1V
2 series · 2 of 2 positions shown · non-contrast
Comparison: None.

CLINICAL DATA: Pt. complaining of constipation for 2 weeks. Patient
reports associated lower abdominal pain.

EXAM:
ABDOMEN - 1 VIEW

[abdomen kub (1 of 2)]
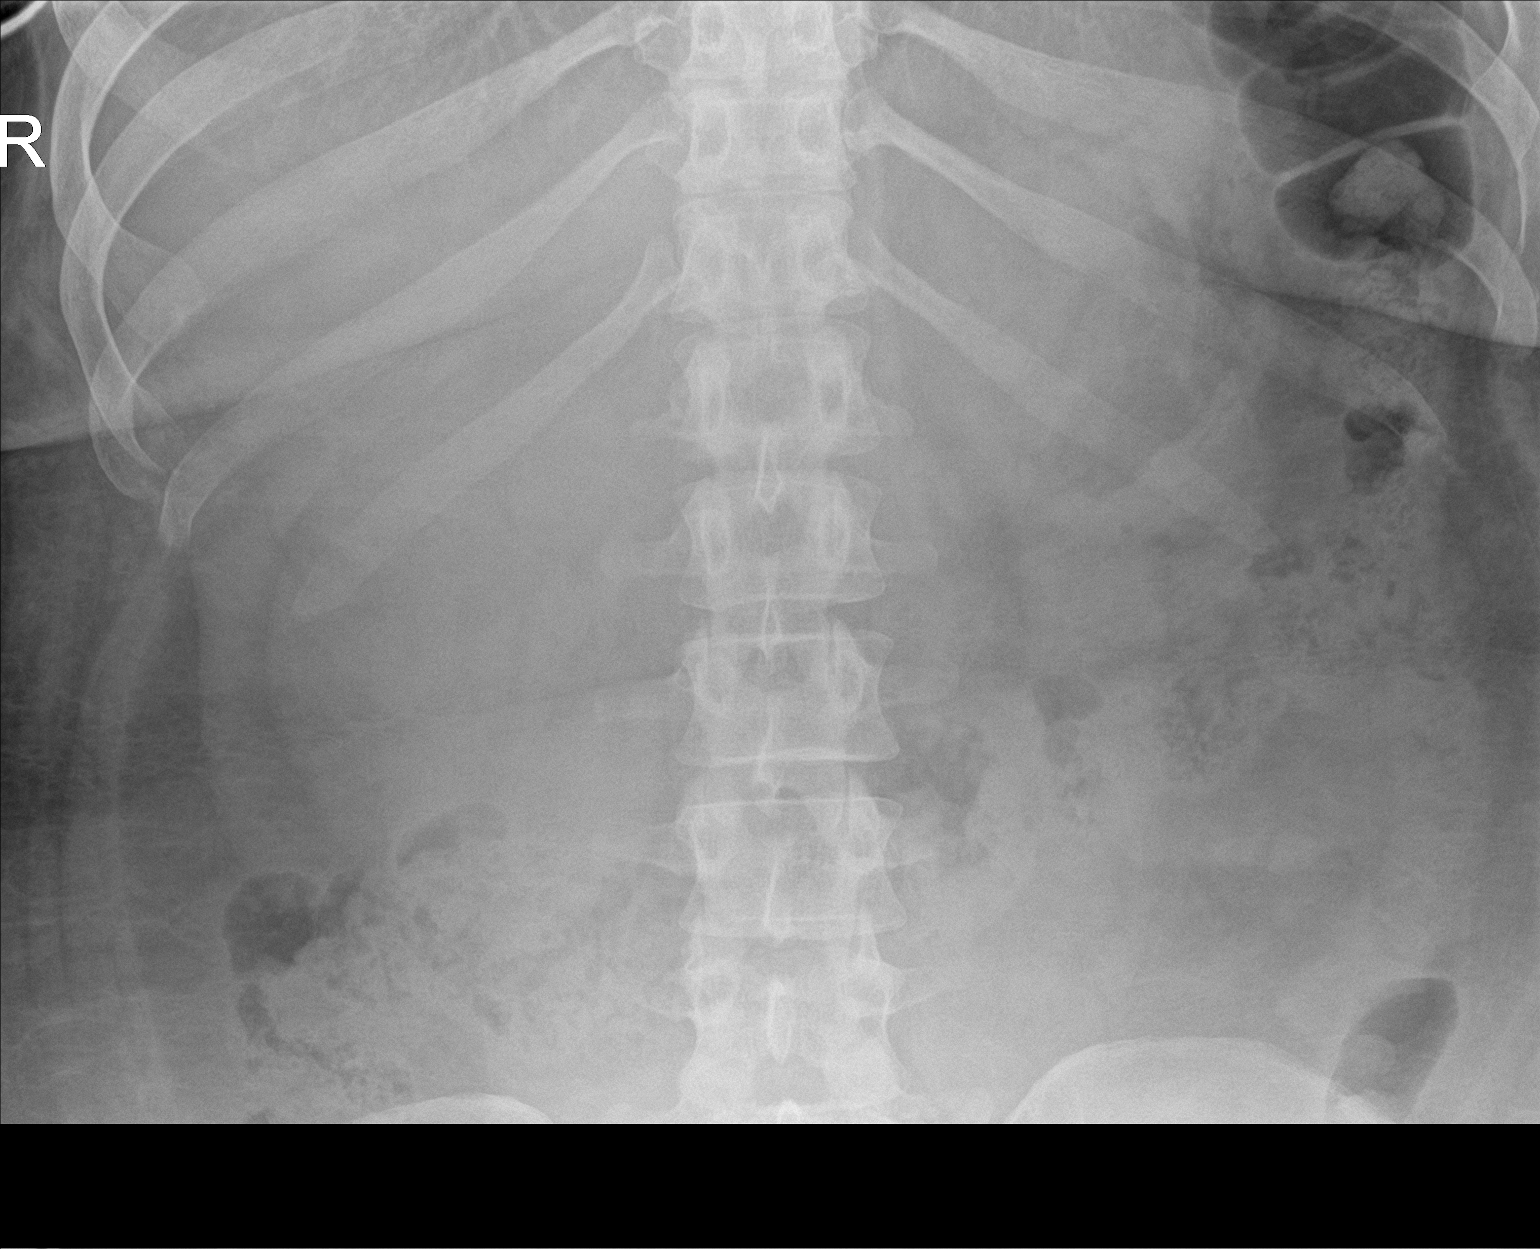

[abdomen kub (2 of 2)]
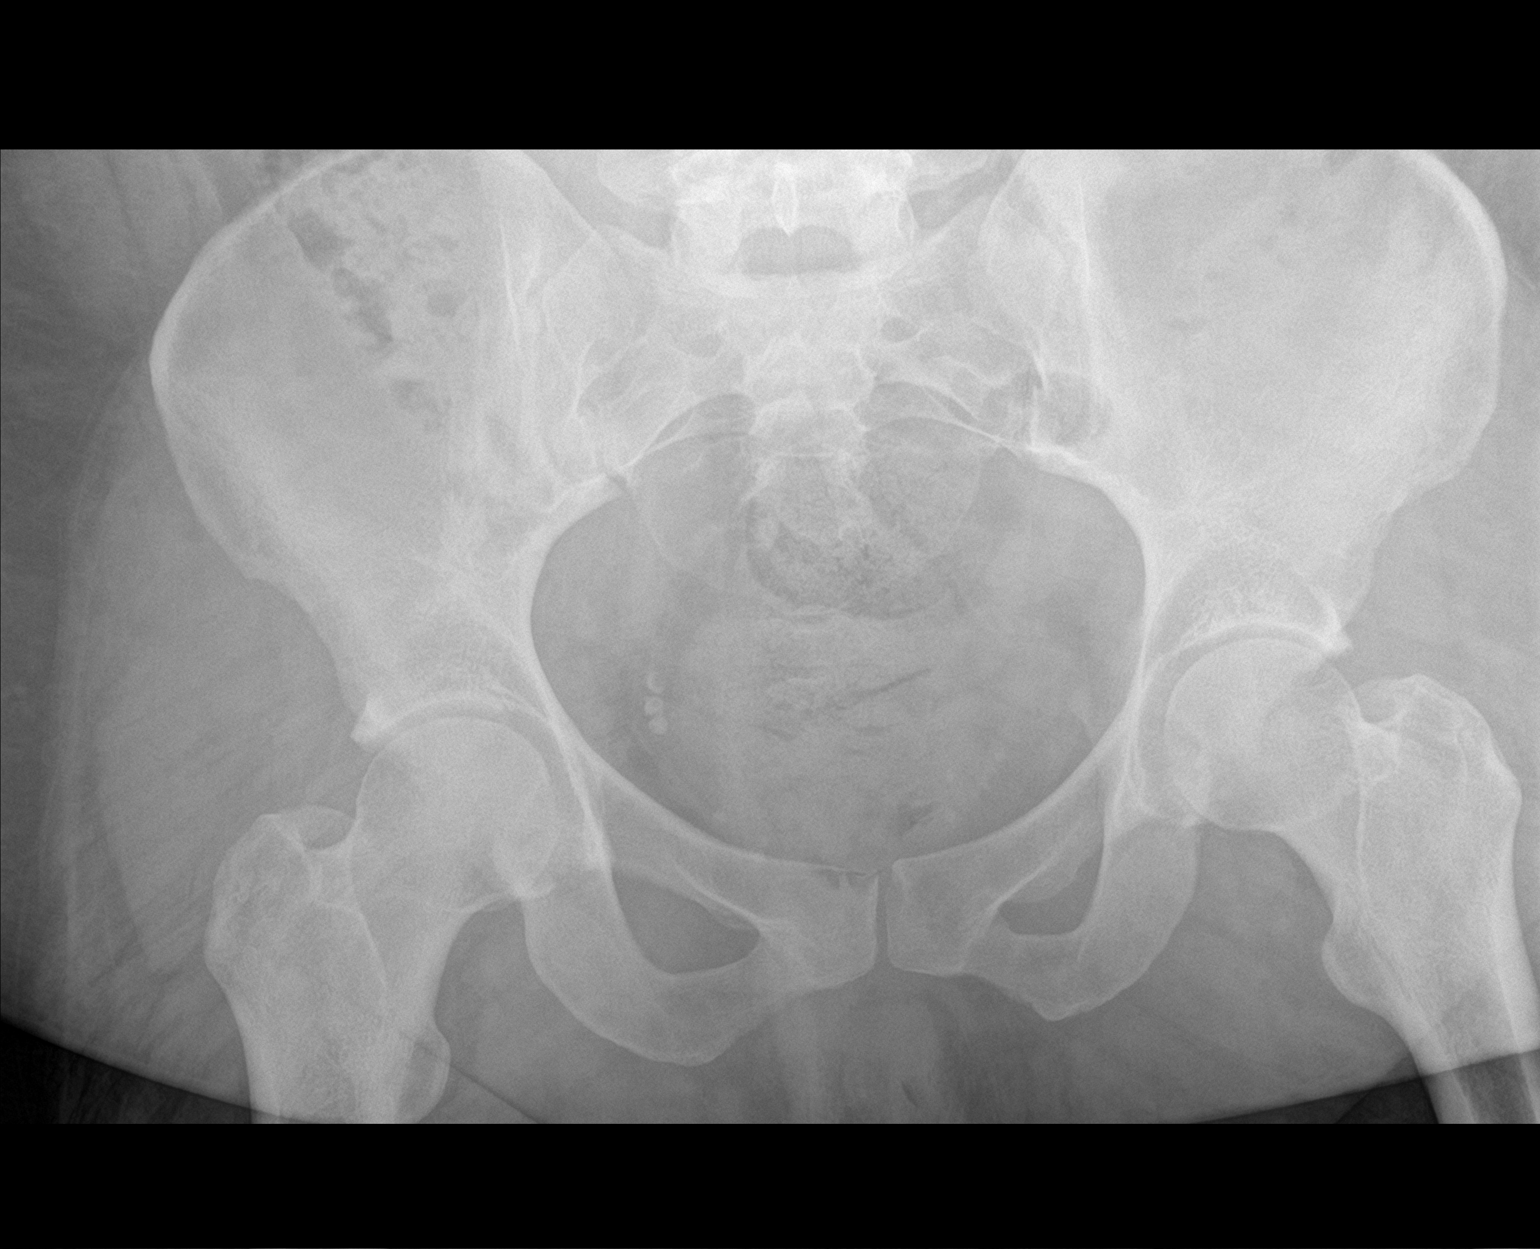

[2 of 2 positions shown; findings below may reference images not displayed]

FINDINGS: Visualized bowel gas pattern is nonobstructive. Fairly large amount
of stool throughout the nondistended colon.

No evidence of soft tissue mass or abnormal fluid collections seen.
No evidence of free intraperitoneal air seen, although the upper
portion of the abdomen is excluded. Visualized osseous structures
are unremarkable. No pathologic appearing calcifications seen, with
several benign phleboliths in the lower pelvis.
IMPRESSION: 1. Fairly large amount of stool throughout the nondistended colon,
compatible with the given history of constipation.
2. No other acute/significant findings. Nonobstructive bowel gas
pattern.

## 2016-08-13 MED ORDER — SORBITOL 70 % SOLN
960.0000 mL | TOPICAL_OIL | Freq: Once | ORAL | Status: AC
  Administered 2016-08-13: 960 mL via RECTAL
  Filled 2016-08-13: qty 240

## 2016-08-13 MED ORDER — POLYETHYLENE GLYCOL 3350 17 G PO PACK
17.0000 g | PACK | Freq: Once | ORAL | Status: AC
  Administered 2016-08-13: 17 g via ORAL
  Filled 2016-08-13: qty 1

## 2016-08-13 MED ORDER — POLYETHYLENE GLYCOL 3350 17 G PO PACK: 17.0000 g | PACK | Freq: Two times a day (BID) | ORAL | 0 refills | Status: AC

## 2016-08-13 NOTE — ED Triage Notes (Signed)
LLQ abdominal pain with nausea that started approx. 1700 this evening. Reports of last BM 2 weeks ago.

## 2016-08-13 NOTE — ED Provider Notes (Addendum)
[facility] Provider Note   CSN: [id number] Arrival date & time: 08/13/16  1946  By signing my name below, I, Bobbie Stack, attest that this documentation has been prepared under the direction and in the presence of Marily Memos, MD. Electronically Signed: Bobbie Stack, Scribe. 08/13/16. 8:05 PM. History   Chief Complaint Chief Complaint  Patient presents with  . Abdominal Pain    The history is provided by the patient. No language interpreter was used.   HPI Comments: Nancy [name] is a 31 y.o. female who presents to the Emergency Department complaining of constipation for 2 weeks. Patient reports associated lower abdominal pain. She has tried Clinical biochemist and all other solutions except for an enema with no relief. She states that she hasn't had any problems passing gas. She reports nausea but denies vomiting. Patient denies any back pain. She also denies dysuria, fever, and rash. She typically doesn't drink water. She has never had this problem in the past. She denies any known medical problems.   Past Medical History:  Diagnosis Date  . Anxiety   . Bipolar disorder (HCC)   . CIN I (cervical intraepithelial neoplasia I)   . Enlarged heart   . Nephrolithiasis   . OSA (obstructive sleep apnea)   . Tachycardia     Patient Active Problem List   Diagnosis Date Noted  . Opiate abuse, episodic 04/09/2015  . MDD (major depressive disorder), recurrent episode, severe (HCC) 04/09/2015  . Suicide attempt by other tranquilizer drug overdose (HCC) 04/09/2015  . 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 Dysmenorrhea 07/06/2013  . Dysmenorrhea 06/09/2013  . Menorrhagia with regular cycle 06/09/2013  . Leg edema 10/03/2011  . Edema leg 10/03/2011  . Bipolar I disorder, most recent episode depressed (HCC) 09/26/2011  . Morbid obesity (HCC) 09/26/2011  . Weakness of both legs 09/04/2011  . Difficulty in walking(719.7) 09/04/2011  .  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 BLADDER AUGMENTATION  2001   enlargement  . DILITATION & CURRETTAGE/HYSTROSCOPY WITH THERMACHOICE ABLATION N/A 07/06/2013   Procedure: DILATATION & CURETTAGE/HYSTEROSCOPY WITH THERMACHOICE ABLATION;  Surgeon: Tilda Burrow, MD;  Location: [facility];  Service: Gynecology;  Laterality: N/A;  total therapt time- 9 minutes 2 seconds; 87C  . LAPAROSCOPIC BILATERAL SALPINGECTOMY Bilateral 07/06/2013   Procedure: LAPAROSCOPIC BILATERAL SALPINGECTOMY;  Surgeon: Tilda Burrow, MD;  Location: [facility];  Service: Gynecology;  Laterality: Bilateral;    OB History    No data available       Home Medications    Prior to Admission medications   Medication Sig Start Date End Date Taking? Authorizing Provider  ondansetron (ZOFRAN ODT) 4 MG disintegrating tablet 4mg  ODT q4 hours prn nausea/vomit Patient taking differently: Take 4 mg by mouth every 4 (four) hours as needed for nausea or vomiting. 4mg  ODT q4 hours prn nausea/vomit 06/18/15  Yes Bethann Berkshire, MD  polyethylene glycol [facility] / GLYCOLAX) packet Take 17 g by mouth 2 (two) times daily. 08/13/16 08/15/16  Marily Memos, MD    Family History Family History  Problem Relation Age of Onset  . Hypertension Mother   . Depression Mother   . Diabetes Mother   . Cancer Mother     brain tumor  . Hypertension Father   . Diabetes Father   . Depression Father   . Hypertension Sister   . Diabetes Sister   . Depression Sister   . Depression Brother   .  Diabetes Brother   . Hypertension Brother   . Cancer Maternal Aunt   . Cancer Paternal Aunt   . Hypertension Maternal Grandmother   . Diabetes Maternal Grandmother   . Depression Maternal Grandmother   . Hypertension Maternal Grandfather   . Diabetes Maternal Grandfather   . Depression Maternal Grandfather   . Hypertension Paternal Grandmother   . Diabetes Paternal Grandmother   . Depression Paternal  Grandmother   . Hypertension Paternal Grandfather   . Diabetes Paternal Grandfather   . Depression Paternal Grandfather     Social History Social History  Substance Use Topics  . Smoking status: Current Every Day Smoker    Packs/day: 0.50    Types: Cigarettes  . Smokeless tobacco: Never Used  . Alcohol use Yes     Comment: occasionally     Allergies   Asa buff (mag [buffered aspirin]; Aspirin; and Tolmetin   Review of Systems Review of Systems  Constitutional: Negative for fever.  Gastrointestinal: Positive for abdominal pain and nausea. Negative for vomiting.  Genitourinary: Negative for dysuria.  Musculoskeletal: Negative for back pain.  Skin: Negative for rash.  All other systems reviewed and are negative.    Physical Exam Updated Vital Signs BP 139/76 (BP Location: Left Arm)   Pulse 80   Temp 98.7 F (37.1 C)   Resp 20   Ht 5\' 7"  (1.702 m)   Wt (!) 350 lb (158.8 kg)   SpO2 99%   BMI 54.82 kg/m   Physical Exam  Constitutional: She is oriented to person, place, and time. She appears well-developed and well-nourished.  HENT:  Head: Normocephalic and atraumatic.  Eyes: EOM are normal.  Neck: Normal range of motion.  Cardiovascular: Normal rate.   No murmur heard. Pulmonary/Chest: Effort normal.  Abdominal: Soft. She exhibits no mass. There is tenderness (diffuse). There is no guarding.  Left lower quadrant and upper quadrant tenderness. Morbidly obese but no obvious masses. No flank pain.   Neurological: She is alert and oriented to person, place, and time.  Skin: Skin is warm and dry.  Psychiatric: She has a normal mood and affect.  Nursing note and vitals reviewed.    ED Treatments / Results  DIAGNOSTIC STUDIES: Oxygen Saturation is 100% on RA, normal by my interpretation.    COORDINATION OF CARE: 8:10 PM Discussed treatment plan with pt at bedside and pt agreed to plan.  Labs (all labs ordered are listed, but only abnormal results are  displayed) Labs Reviewed  I-STAT CHEM 8, ED - Abnormal; Notable for the following:       Result Value   BUN 4 (*)    Glucose, Bld 161 (*)    All other components within normal limits    EKG  EKG Interpretation None       Radiology Dg Abdomen 1 View  Result Date: 08/13/2016 CLINICAL DATA:  Pt. complaining of constipation for 2 weeks. Patient reports associated lower abdominal pain. EXAM: ABDOMEN - 1 VIEW COMPARISON:  None. FINDINGS: Visualized bowel gas pattern is nonobstructive. Fairly large amount of stool throughout the nondistended colon. No evidence of soft tissue mass or abnormal fluid collections seen. No evidence of free intraperitoneal air seen, although the upper portion of the abdomen is excluded. Visualized osseous structures are unremarkable. No pathologic appearing calcifications seen, with several benign phleboliths in the lower pelvis. IMPRESSION: 1. Fairly large amount of stool throughout the nondistended colon, compatible with the given history of constipation. 2. No other acute/significant findings. Nonobstructive bowel  gas pattern. Electronically Signed   By: Bary Richard M.D.   On: 08/13/2016 21:35    Procedures Procedures (including critical care time)  Medications Ordered in ED Medications  sorbitol, milk of mag, mineral oil, glycerin (SMOG) enema (960 mLs Rectal Given 08/13/16 2136)  polyethylene glycol (MIRALAX / GLYCOLAX) packet 17 g (17 g Oral Given 08/13/16 2033)     Initial Impression / Assessment and Plan / ED Course  I have reviewed the triage vital signs and the nursing notes.  Pertinent labs & imaging results that were available during my care of the patient were reviewed by me and considered in my medical decision making (see chart for details).  Clinical Course     Constipation causing severe abdominal pain. Both relieved with miralax and SMOG enema. Stable abdominal exam. Will dc on miralax for next couple days and prn enemas.   Final  Clinical Impressions(s) / ED Diagnoses   Final diagnoses:  Constipation, unspecified constipation type  Abdominal pain, unspecified abdominal location    New Prescriptions Discharge Medication List as of 08/13/2016 10:35 PM    START taking these medications   Details  polyethylene glycol (MIRALAX / GLYCOLAX) packet Take 17 g by mouth 2 (two) times daily., Starting Tue 08/13/2016, Until Thu 08/15/2016, Print       I personally performed the services described in this documentation, which was scribed in my presence. The recorded information has been reviewed and is accurate.    Marily Memos, MD 08/14/16 1156    Marily Memos, MD 08/14/16 [phone]

## 2016-08-13 NOTE — ED Notes (Signed)
Pt reports she had large bowel movement and feels so much better.

## 2016-08-27 ENCOUNTER — Ambulatory Visit (HOSPITAL_COMMUNITY)
Admission: RE | Admit: 2016-08-27 | Discharge: 2016-08-27 | Disposition: A | Discharge status: Home / Self Care | Payer: No Typology Code available for payment source | Source: Home / Self Care | Attending: Psychiatry | Admitting: Psychiatry

## 2016-08-27 ENCOUNTER — Emergency Department (HOSPITAL_COMMUNITY)
Admission: EM | Admit: 2016-08-27 | Discharge: 2016-08-27 | Disposition: A | Discharge status: Other Acute Inpatient Hospital | Payer: Medicaid - Out of State | Attending: Emergency Medicine | Admitting: Emergency Medicine

## 2016-08-27 ENCOUNTER — Encounter (HOSPITAL_COMMUNITY): Payer: Self-pay | Admitting: Emergency Medicine

## 2016-08-27 ENCOUNTER — Inpatient Hospital Stay (HOSPITAL_COMMUNITY)
Admission: AD | Admit: 2016-08-27 | Discharge: 2016-08-30 | DRG: 885 | Disposition: A | Discharge status: Home / Self Care | Payer: No Typology Code available for payment source | Source: Intra-hospital | Attending: Psychiatry | Admitting: Psychiatry

## 2016-08-27 ENCOUNTER — Encounter (HOSPITAL_COMMUNITY): Payer: Self-pay

## 2016-08-27 DIAGNOSIS — Z62810 Personal history of physical and sexual abuse in childhood: Secondary | ICD-10-CM | POA: Diagnosis not present

## 2016-08-27 DIAGNOSIS — F17210 Nicotine dependence, cigarettes, uncomplicated: Secondary | ICD-10-CM | POA: Diagnosis present

## 2016-08-27 DIAGNOSIS — F4310 Post-traumatic stress disorder, unspecified: Secondary | ICD-10-CM | POA: Diagnosis not present

## 2016-08-27 DIAGNOSIS — F3130 Bipolar disorder, current episode depressed, mild or moderate severity, unspecified: Secondary | ICD-10-CM | POA: Diagnosis present

## 2016-08-27 DIAGNOSIS — F319 Bipolar disorder, unspecified: Secondary | ICD-10-CM | POA: Diagnosis not present

## 2016-08-27 DIAGNOSIS — Z833 Family history of diabetes mellitus: Secondary | ICD-10-CM | POA: Diagnosis not present

## 2016-08-27 DIAGNOSIS — Z915 Personal history of self-harm: Secondary | ICD-10-CM | POA: Diagnosis not present

## 2016-08-27 DIAGNOSIS — Z6841 Body Mass Index (BMI) 40.0 and over, adult: Secondary | ICD-10-CM | POA: Diagnosis not present

## 2016-08-27 DIAGNOSIS — Z79899 Other long term (current) drug therapy: Secondary | ICD-10-CM | POA: Diagnosis not present

## 2016-08-27 DIAGNOSIS — E118 Type 2 diabetes mellitus with unspecified complications: Secondary | ICD-10-CM | POA: Diagnosis present

## 2016-08-27 DIAGNOSIS — Z8249 Family history of ischemic heart disease and other diseases of the circulatory system: Secondary | ICD-10-CM | POA: Diagnosis not present

## 2016-08-27 DIAGNOSIS — Z87410 Personal history of cervical dysplasia: Secondary | ICD-10-CM

## 2016-08-27 DIAGNOSIS — R45851 Suicidal ideations: Secondary | ICD-10-CM

## 2016-08-27 DIAGNOSIS — G4700 Insomnia, unspecified: Secondary | ICD-10-CM | POA: Diagnosis present

## 2016-08-27 DIAGNOSIS — Z888 Allergy status to other drugs, medicaments and biological substances status: Secondary | ICD-10-CM | POA: Diagnosis not present

## 2016-08-27 DIAGNOSIS — F314 Bipolar disorder, current episode depressed, severe, without psychotic features: Principal | ICD-10-CM | POA: Diagnosis present

## 2016-08-27 DIAGNOSIS — Z886 Allergy status to analgesic agent status: Secondary | ICD-10-CM | POA: Diagnosis not present

## 2016-08-27 DIAGNOSIS — F419 Anxiety disorder, unspecified: Secondary | ICD-10-CM | POA: Diagnosis present

## 2016-08-27 DIAGNOSIS — Z818 Family history of other mental and behavioral disorders: Secondary | ICD-10-CM | POA: Diagnosis not present

## 2016-08-27 DIAGNOSIS — Z9114 Patient's other noncompliance with medication regimen: Secondary | ICD-10-CM | POA: Diagnosis not present

## 2016-08-27 DIAGNOSIS — G4733 Obstructive sleep apnea (adult) (pediatric): Secondary | ICD-10-CM | POA: Diagnosis present

## 2016-08-27 DIAGNOSIS — Z87442 Personal history of urinary calculi: Secondary | ICD-10-CM

## 2016-08-27 DIAGNOSIS — Z9889 Other specified postprocedural states: Secondary | ICD-10-CM | POA: Diagnosis not present

## 2016-08-27 LAB — RAPID URINE DRUG SCREEN, HOSP PERFORMED
AMPHETAMINES: NOT DETECTED
Barbiturates: NOT DETECTED
Benzodiazepines: NOT DETECTED
COCAINE: NOT DETECTED
OPIATES: POSITIVE — AB
TETRAHYDROCANNABINOL: NOT DETECTED

## 2016-08-27 LAB — COMPREHENSIVE METABOLIC PANEL
ALBUMIN: 3.8 g/dL (ref 3.5–5.0)
ALT: 24 U/L (ref 14–54)
ANION GAP: 8 (ref 5–15)
AST: 22 U/L (ref 15–41)
Alkaline Phosphatase: 64 U/L (ref 38–126)
BUN: 10 mg/dL (ref 6–20)
CHLORIDE: 104 mmol/L (ref 101–111)
CO2: 27 mmol/L (ref 22–32)
CREATININE: 0.7 mg/dL (ref 0.44–1.00)
Calcium: 9.2 mg/dL (ref 8.9–10.3)
GFR calc Af Amer: >60 mL/min (ref >60)
GFR calc non Af Amer: >60 mL/min (ref >60)
Glucose, Bld: 170 mg/dL — ABNORMAL HIGH (ref 65–99)
Potassium: 3.9 mmol/L (ref 3.5–5.1)
SODIUM: 139 mmol/L (ref 135–145)
Total Bilirubin: 0.6 mg/dL (ref 0.3–1.2)
Total Protein: 7.3 g/dL (ref 6.5–8.1)

## 2016-08-27 LAB — ETHANOL: Alcohol, Ethyl (B): <5 mg/dL (ref <5)

## 2016-08-27 LAB — CBC
HCT: 38.9 % (ref 36.0–46.0)
HEMOGLOBIN: 13 g/dL (ref 12.0–15.0)
MCH: 28.6 pg (ref 26.0–34.0)
MCHC: 33.4 g/dL (ref 30.0–36.0)
MCV: 85.7 fL (ref 78.0–100.0)
Platelets: 224 10*3/uL (ref 150–400)
RBC: 4.54 MIL/uL (ref 3.87–5.11)
RDW: 12.9 % (ref 11.5–15.5)
WBC: 7 10*3/uL (ref 4.0–10.5)

## 2016-08-27 LAB — SALICYLATE LEVEL: Salicylate Lvl: <7 mg/dL (ref 2.8–30.0)

## 2016-08-27 LAB — GLUCOSE, CAPILLARY: Glucose-Capillary: 151 mg/dL — ABNORMAL HIGH (ref 65–99)

## 2016-08-27 LAB — ACETAMINOPHEN LEVEL

## 2016-08-27 MED ORDER — LURASIDONE HCL 40 MG PO TABS
40.0000 mg | ORAL_TABLET | Freq: Every day | ORAL | Status: DC
  Filled 2016-08-27: qty 1

## 2016-08-27 MED ORDER — TRAZODONE HCL 100 MG PO TABS: 100.0000 mg | ORAL_TABLET | Freq: Every evening | ORAL | Status: DC | PRN

## 2016-08-27 MED ORDER — VENLAFAXINE HCL ER 75 MG PO CP24
75.0000 mg | ORAL_CAPSULE | Freq: Every day | ORAL | Status: DC
  Administered 2016-08-28 – 2016-08-30 (×3): 75 mg via ORAL
  Filled 2016-08-27 (×5): qty 1
  Filled 2016-08-27: qty 7

## 2016-08-27 MED ORDER — LAMOTRIGINE 25 MG PO TABS
25.0000 mg | ORAL_TABLET | Freq: Every day | ORAL | Status: DC
  Administered 2016-08-27: 25 mg via ORAL
  Filled 2016-08-27: qty 1

## 2016-08-27 MED ORDER — MIRTAZAPINE 15 MG PO TABS
15.0000 mg | ORAL_TABLET | Freq: Every evening | ORAL | Status: DC | PRN
  Administered 2016-08-27: 15 mg via ORAL
  Filled 2016-08-27 (×5): qty 1

## 2016-08-27 MED ORDER — INSULIN GLARGINE 100 UNIT/ML ~~LOC~~ SOLN
10.0000 [IU] | Freq: Every day | SUBCUTANEOUS | Status: DC
  Administered 2016-08-27 – 2016-08-29 (×3): 10 [IU] via SUBCUTANEOUS
  Filled 2016-08-27: qty 0.1

## 2016-08-27 MED ORDER — INSULIN GLARGINE 100 UNIT/ML ~~LOC~~ SOLN
10.0000 [IU] | Freq: Every day | SUBCUTANEOUS | Status: DC
  Filled 2016-08-27: qty 0.1

## 2016-08-27 MED ORDER — LAMOTRIGINE 25 MG PO TABS
25.0000 mg | ORAL_TABLET | Freq: Every day | ORAL | Status: DC
  Administered 2016-08-28 – 2016-08-30 (×3): 25 mg via ORAL
  Filled 2016-08-27: qty 1
  Filled 2016-08-27: qty 7
  Filled 2016-08-27 (×4): qty 1

## 2016-08-27 MED ORDER — ACETAMINOPHEN 325 MG PO TABS
650.0000 mg | ORAL_TABLET | Freq: Four times a day (QID) | ORAL | Status: DC | PRN
  Administered 2016-08-27: 650 mg via ORAL
  Filled 2016-08-27: qty 2

## 2016-08-27 MED ORDER — NICOTINE POLACRILEX 2 MG MT GUM
2.0000 mg | CHEWING_GUM | OROMUCOSAL | Status: DC | PRN
  Administered 2016-08-29: 2 mg via ORAL
  Filled 2016-08-27: qty 1

## 2016-08-27 MED ORDER — VENLAFAXINE HCL ER 75 MG PO CP24
75.0000 mg | ORAL_CAPSULE | Freq: Every day | ORAL | Status: DC
  Administered 2016-08-27: 75 mg via ORAL
  Filled 2016-08-27: qty 1

## 2016-08-27 MED ORDER — LURASIDONE HCL 40 MG PO TABS
40.0000 mg | ORAL_TABLET | Freq: Every day | ORAL | Status: DC
  Administered 2016-08-27 – 2016-08-29 (×3): 40 mg via ORAL
  Filled 2016-08-27: qty 1
  Filled 2016-08-27: qty 7
  Filled 2016-08-27 (×4): qty 1

## 2016-08-27 NOTE — Tx Team (Addendum)
Initial Treatment Plan 08/27/2016 8:03 PM Nancy PlumeMinnie Blackstock ZOX:[id number]    PATIENT STRESSORS: Financial difficulties Medication change or noncompliance   PATIENT STRENGTHS: Capable of independent living Wellsite geologistCommunication skills General fund of knowledge Motivation for treatment/growth Supportive family/friends   PATIENT IDENTIFIED PROBLEMS: Depression  Anxiety  Suicidal ideation  Psychosis "hearing chatter in my head"  "Learn some new coping skills"  "Manage my depression"           DISCHARGE CRITERIA:  Improved stabilization in mood, thinking, and/or behavior Verbal commitment to aftercare and medication compliance  PRELIMINARY DISCHARGE PLAN: Outpatient therapy Medication management  PATIENT/FAMILY INVOLVEMENT: This treatment plan has been presented to and reviewed with the patient, [employer] IncMinnie Blackstock.  The patient and family have been given the opportunity to ask questions and make suggestions.  Levin BaconHeather V Janaki Exley, RN 08/27/2016, 8:03 PM

## 2016-08-27 NOTE — Progress Notes (Signed)
Nancy [name] is a [age] year old female being admitted voluntarily to [facility]-2 [facility]  She was medically cleared at [facility].  She came in for increasing suicidal ideation, mania and not sleeping x 2 days.  She denies HI.  She does admit that she is hearing "chatter" in her head.  She does not appear to be responding to internal stimuli.  She has a history of suicide attempt in the past and [facility].  She is cooperative and pleasant.  She continues to voice depressive symptoms and stated that she was exhausted.  She c/o headache 7/10.  RN made aware.  She is diagnosed with Bipolar I disorder.  Oriented her to the unit.  Admission paperwork completed and signed.  Belongings searched and secured in locker # 25.  Skin assessment completed and rash area under abdominal skin fold("I use powder when I can").  Q 15 minute checks initiated for safety.  We will monitor the progress towards her goals.

## 2016-08-27 NOTE — ED Notes (Signed)
Patient is refusing to remove a ring on her left hand.

## 2016-08-27 NOTE — ED Notes (Signed)
Bed: WTR5 Expected date:  Expected time:  Means of arrival:  Comments: 

## 2016-08-27 NOTE — ED Notes (Signed)
[facility].  She has slept since arriving in the [facility].  She was started on her scheduled medications as ordered.  [facility], RN.  Patient left the unit ambulatory with [employer].  All belongings were given to the driver.

## 2016-08-27 NOTE — H&P (Signed)
Behavioral Health Medical Screening Exam  Nancy PlumeMinnie Blackstock is an 31 y.o. female, [facility], accompanied alone, endorsing suicidal ideations over the last 2 weeks caused by flashbacks of previous trauma. Patient has a hx of Bipolar Disorder but is denying any acute medical conditions or concerns.  Total Time spent with patient: 20 minutes  Psychiatric Specialty Exam: Physical Exam  Constitutional: She is oriented to person, place, and time. She appears well-developed and well-nourished. No distress.  HENT:  Head: Normocephalic.  Eyes: Pupils are equal, round, and reactive to light.  Respiratory: Effort normal and breath sounds normal.  Neurological: She is alert and oriented to person, place, and time. No cranial nerve deficit.  Skin: Skin is warm and dry. She is not diaphoretic.  Psychiatric: Judgment normal. Her speech is delayed. She is withdrawn. Cognition and memory are normal. She exhibits a depressed mood. She expresses suicidal ideation. She expresses suicidal plans.    Review of Systems  Psychiatric/Behavioral: Positive for depression and suicidal ideas. The patient has insomnia.   All other systems reviewed and are negative.   There were no vitals taken for this visit.There is no height or weight on file to calculate BMI.  General Appearance: Fairly Groomed  Eye Contact:  Fair  Speech:  Slow  Volume:  Normal  Mood:  Depressed  Affect:  Congruent  Thought Process:  Goal Directed  Orientation:  Full (Time, Place, and Person)  Thought Content:  Negative  Suicidal Thoughts:  Yes.  with intent/plan  Homicidal Thoughts:  No  Memory:  Immediate;   Good  Judgement:  Fair  Insight:  Fair  Psychomotor Activity:  Negative  Concentration: Concentration: Good  Recall:  Negative  Fund of Knowledge:Good  Language: Good  Akathisia:  Negative  Handed:  Right  AIMS (if indicated):     Assets:  Desire for Improvement  Sleep:       Musculoskeletal: Strength &  Muscle Tone: within normal limits Gait & Station: normal Patient leans: N/A  There were no vitals taken for this visit.  Recommendations:  Based on my evaluation the patient does not appear to have an emergency medical condition.  SIMON,SPENCER E, PA-C 08/27/2016, 5:52 AM

## 2016-08-27 NOTE — Progress Notes (Signed)
CSW spoke with patient at bedside regarding voluntary admission and consent for treatment. CSW obtained patient's signature on voluntary admission and consent for treatment form. [facility] and provided patient's RN with signed formed.

## 2016-08-27 NOTE — ED Triage Notes (Signed)
Pt reports having Suicidal ideation for the last several weeks with auditory hallucinations that consist of chatter. [facility] and meets inpatient admission criteria and needs medical clearance. Pt reports increased depression and not currently taking medications. Pt denies any plan for SI.

## 2016-08-27 NOTE — ED Provider Notes (Signed)
[facility] Provider Note   CSN: [id number] Arrival date & time: 08/27/16  0604     History   Chief Complaint Chief Complaint  Patient presents with  . Suicidal    HPI Nancy [name] is a 31 y.o. female with history of bipolar disorder, anxiety, depression who presents with increased depression with suicidal ideations that has been present for the past few days. Patient reports that she has experienced suicidal ideations before and has taken medication for it, however she has not been taking her medication lately. Patient states she did have a plan over the past few days and came to the hospital so she would not execute it. Patient denies hallucination at this time, however patient reported to the nurse that she has had auditory hallucinations that consist of chatter the last several weeks. Patient denies homicidal ideations. She denies drug or alcohol use. Patient does smoke cigarettes. Patient has already been evaluated at [facility] and meets inpatient criteria, but is here for medical clearance. Patient reports headache at this time, that she attributes to stress. Patient denies any other symptoms. She denies any chest pain, shortness of breath, abdominal pain, nausea, vomiting, urinary symptoms, or numbness or tingling. Patient reports that she takes insulin for her diabetes, but has not taken it in over 24 hours.  HPI  Past Medical History:  Diagnosis Date  . Anxiety   . Bipolar disorder (HCC)   . CIN I (cervical intraepithelial neoplasia I)   . Enlarged heart   . Nephrolithiasis   . OSA (obstructive sleep apnea)   . Tachycardia     Patient Active Problem List   Diagnosis Date Noted  . Opiate abuse, episodic 04/09/2015  . MDD (major depressive disorder), recurrent episode, severe (HCC) 04/09/2015  . Suicide attempt by other tranquilizer drug overdose (HCC) 04/09/2015  . 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 Dysmenorrhea 07/06/2013  .  Dysmenorrhea 06/09/2013  . Menorrhagia with regular cycle 06/09/2013  . Leg edema 10/03/2011  . Edema leg 10/03/2011  . Bipolar I disorder, most recent episode depressed (HCC) 09/26/2011  . Morbid obesity (HCC) 09/26/2011  . Weakness of both legs 09/04/2011  . Difficulty in walking(719.7) 09/04/2011  . 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 BLADDER AUGMENTATION  2001   enlargement  . DILITATION & CURRETTAGE/HYSTROSCOPY WITH THERMACHOICE ABLATION N/A 07/06/2013   Procedure: DILATATION & CURETTAGE/HYSTEROSCOPY WITH THERMACHOICE ABLATION;  Surgeon: Tilda Burrow, MD;  Location: [facility];  Service: Gynecology;  Laterality: N/A;  total therapt time- 9 minutes 2 seconds; 87C  . LAPAROSCOPIC BILATERAL SALPINGECTOMY Bilateral 07/06/2013   Procedure: LAPAROSCOPIC BILATERAL SALPINGECTOMY;  Surgeon: Tilda Burrow, MD;  Location: [facility];  Service: Gynecology;  Laterality: Bilateral;    OB History    No data available       Home Medications    Prior to Admission medications   Medication Sig Start Date End Date Taking? Authorizing Provider  ondansetron (ZOFRAN ODT) 4 MG disintegrating tablet 4mg  ODT q4 hours prn nausea/vomit Patient not taking: Reported on 08/27/2016 06/18/15   Bethann Berkshire, MD    Family History Family History  Problem Relation Age of Onset  . Hypertension Mother   . Depression Mother   . Diabetes Mother   . Cancer Mother     brain tumor  . Hypertension Father   . Diabetes Father   . Depression Father   . Hypertension Sister   .  Diabetes Sister   . Depression Sister   . Depression Brother   . Diabetes Brother   . Hypertension Brother   . Cancer Maternal Aunt   . Cancer Paternal Aunt   . Hypertension Maternal Grandmother   . Diabetes Maternal Grandmother   . Depression Maternal Grandmother   . Hypertension Maternal Grandfather   . Diabetes Maternal Grandfather   . Depression Maternal Grandfather   .  Hypertension Paternal Grandmother   . Diabetes Paternal Grandmother   . Depression Paternal Grandmother   . Hypertension Paternal Grandfather   . Diabetes Paternal Grandfather   . Depression Paternal Grandfather     Social History Social History  Substance Use Topics  . Smoking status: Current Every Day Smoker    Packs/day: 1.00    Types: Cigarettes  . Smokeless tobacco: Never Used  . Alcohol use Yes     Comment: occasionally     Allergies   Asa buff (mag [buffered aspirin]; Aspirin; and Tolmetin   Review of Systems Review of Systems  Constitutional: Negative for chills and fever.  HENT: Negative for facial swelling and sore throat.   Respiratory: Negative for shortness of breath.   Cardiovascular: Negative for chest pain.  Gastrointestinal: Negative for abdominal pain, nausea and vomiting.  Genitourinary: Negative for dysuria.  Musculoskeletal: Negative for back pain.  Skin: Negative for rash and wound.  Neurological: Positive for headaches.  Psychiatric/Behavioral: Positive for suicidal ideas.     Physical Exam Updated Vital Signs BP 140/85 (BP Location: Right Arm)   Pulse 92   Temp 98.4 F (36.9 C) (Oral)   Resp 17   Ht 5\' 6"  (1.676 m)   Wt (!) 158.8 kg   SpO2 98%   BMI 56.49 kg/m   Physical Exam  Constitutional: She appears well-developed and well-nourished. No distress.  HENT:  Head: Normocephalic and atraumatic.  Mouth/Throat: Oropharynx is clear and moist. No oropharyngeal exudate.  Eyes: Conjunctivae and EOM are normal. Pupils are equal, round, and reactive to light. Right eye exhibits no discharge. Left eye exhibits no discharge. No scleral icterus.  Neck: Normal range of motion. Neck supple. No thyromegaly present.  Cardiovascular: Normal rate, regular rhythm, normal heart sounds and intact distal pulses.  Exam reveals no gallop and no friction rub.   No murmur heard. Pulmonary/Chest: Effort normal and breath sounds normal. No stridor. No  respiratory distress. She has no wheezes. She has no rales.  Abdominal: Soft. Bowel sounds are normal. She exhibits no distension. There is no tenderness. There is no rebound and no guarding.  Musculoskeletal: She exhibits no edema.  Lymphadenopathy:    She has no cervical adenopathy.  Neurological: She is alert. Coordination normal.  CN 3-12 intact; normal sensation throughout; 5/5 strength in all 4 extremities; equal bilateral grip strength  Skin: Skin is warm and dry. No rash noted. She is not diaphoretic. No pallor.  Psychiatric: She has a normal mood and affect.  Nursing note and vitals reviewed.    ED Treatments / Results  Labs (all labs ordered are listed, but only abnormal results are displayed) Labs Reviewed  COMPREHENSIVE METABOLIC PANEL - Abnormal; Notable for the following:       Result Value   Glucose, Bld 170 (*)    All other components within normal limits  ACETAMINOPHEN LEVEL - Abnormal; Notable for the following:    Acetaminophen (Tylenol), Serum <10 (*)    All other components within normal limits  ETHANOL  SALICYLATE LEVEL  CBC  RAPID URINE  DRUG SCREEN, HOSP PERFORMED    EKG  EKG Interpretation None       Radiology No results found.  Procedures Procedures (including critical care time)  Medications Ordered in ED Medications - No data to display   Initial Impression / Assessment and Plan / ED Course  I have reviewed the triage vital signs and the nursing notes.  Pertinent labs & imaging results that were available during my care of the patient were reviewed by me and considered in my medical decision making (see chart for details).     Patient with suicidal ideations, already evaluated by TTS at [facility] today. CBC unremarkable. CMP shows glucose 170. Negative for ethanol, salicylates, acetaminophen. Recommend resuming insulin treatment for patient's diabetes. Patient is medically cleared for inpatient admission to [facility]. Will place in psych hold until  bed available at [facility].  Final Clinical Impressions(s) / ED Diagnoses   Final diagnoses:  Suicidal ideation    New Prescriptions New Prescriptions   No medications on file         Emi Holes, Cordelia Poche 08/27/16 1610    Arby Barrette, MD 08/27/16 1539

## 2016-08-27 NOTE — Progress Notes (Signed)
Adult Psychoeducational Group Note  Date:  08/27/2016 Time:  11:31 PM  Group Topic/Focus:  Wrap-Up Group:   The focus of this group is to help patients review their daily goal of treatment and discuss progress on daily workbooks.  Participation Level:  Active  Participation Quality:  Appropriate  Affect:  Appropriate  Cognitive:  Appropriate  Insight: Appropriate  Engagement in Group:  Engaged  Modes of Intervention:  Activity  Additional Comments:  Patient rated her day a 2. Goal is to get some much needed rest.  Natasha MeadKiara M Naylani Bradner 08/27/2016, 11:31 PM

## 2016-08-27 NOTE — BH Assessment (Signed)
[facility] Assessment Progress Note  Per Thedore MinsMojeed Akintayo, MD, this pt requires psychiatric hospitalization at this time.  Berneice Heinrichina Tate, RN, [facility] Rm [id number].  Celso SickleKimberly Long, LCSW agrees to discuss consent documents with pt.   Doylene Canninghomas Kodi Steil, MA Triage Specialist [phone]

## 2016-08-27 NOTE — Progress Notes (Signed)
08/27/16 1351:  LRT went to pt room to introduce self and offer activities, pt was sleep.  Caroll RancherMarjette Taura Lamarre, LRT/CTRS
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
## 2016-08-28 DIAGNOSIS — Z809 Family history of malignant neoplasm, unspecified: Secondary | ICD-10-CM

## 2016-08-28 DIAGNOSIS — Z811 Family history of alcohol abuse and dependence: Secondary | ICD-10-CM

## 2016-08-28 DIAGNOSIS — Z888 Allergy status to other drugs, medicaments and biological substances status: Secondary | ICD-10-CM

## 2016-08-28 DIAGNOSIS — Z8249 Family history of ischemic heart disease and other diseases of the circulatory system: Secondary | ICD-10-CM

## 2016-08-28 DIAGNOSIS — F314 Bipolar disorder, current episode depressed, severe, without psychotic features: Principal | ICD-10-CM

## 2016-08-28 DIAGNOSIS — Z818 Family history of other mental and behavioral disorders: Secondary | ICD-10-CM

## 2016-08-28 DIAGNOSIS — Z833 Family history of diabetes mellitus: Secondary | ICD-10-CM

## 2016-08-28 DIAGNOSIS — F4310 Post-traumatic stress disorder, unspecified: Secondary | ICD-10-CM

## 2016-08-28 DIAGNOSIS — Z79899 Other long term (current) drug therapy: Secondary | ICD-10-CM

## 2016-08-28 DIAGNOSIS — Z9889 Other specified postprocedural states: Secondary | ICD-10-CM

## 2016-08-28 LAB — PREGNANCY, URINE: Preg Test, Ur: NEGATIVE

## 2016-08-28 LAB — GLUCOSE, CAPILLARY: Glucose-Capillary: 156 mg/dL — ABNORMAL HIGH (ref 65–99)

## 2016-08-28 MED ORDER — ZOLPIDEM TARTRATE 5 MG PO TABS
5.0000 mg | ORAL_TABLET | Freq: Every evening | ORAL | Status: DC | PRN
  Administered 2016-08-28 – 2016-08-29 (×2): 5 mg via ORAL
  Filled 2016-08-28 (×2): qty 1

## 2016-08-28 NOTE — Tx Team (Signed)
Interdisciplinary Treatment and Diagnostic Plan Update  08/28/2016 Time of Session: 12:32 PM  Nancy [name] MRN: [id number]  Principal Diagnosis: Bipolar affective disorder, depressed, severe ([place])  Secondary Diagnoses: Principal Problem:   Bipolar affective disorder, depressed, severe ([place])   Current Medications:  Current Facility-Administered Medications  Medication Dose Route Frequency Provider Last Rate Last Dose  . acetaminophen (TYLENOL) tablet 650 mg  650 mg Oral Q6H PRN Patrecia Pour, NP   650 mg at 08/27/16 1905  . insulin glargine (LANTUS) injection 10 Units  10 Units Subcutaneous QHS Patrecia Pour, NP   10 Units at 08/27/16 2129  . lamoTRIgine (LAMICTAL) tablet 25 mg  25 mg Oral Daily Patrecia Pour, NP   25 mg at 08/28/16 0914  . lurasidone (LATUDA) tablet 40 mg  40 mg Oral QHS Patrecia Pour, NP   40 mg at 08/27/16 2127  . nicotine polacrilex (NICORETTE) gum 2 mg  2 mg Oral PRN Jenne Campus, MD      . venlafaxine XR (EFFEXOR-XR) 24 hr capsule 75 mg  75 mg Oral Q breakfast Patrecia Pour, NP   75 mg at 08/28/16 0915  . zolpidem (AMBIEN) tablet 5 mg  5 mg Oral QHS PRN Jenne Campus, MD        PTA Medications: Prescriptions Prior to Admission  Medication Sig Dispense Refill Last Dose  . ondansetron (ZOFRAN ODT) 4 MG disintegrating tablet 25m ODT q4 hours prn nausea/vomit (Patient not taking: Reported on 08/27/2016) 12 tablet 0 Not Taking at Unknown time    Treatment Modalities: Medication Management, Group therapy, Case management,  1 to 1 session with clinician, Psychoeducation, Recreational therapy.  Patient Stressors: Financial difficulties Medication change or noncompliance  Patient Strengths: Capable of independent living CCuratorfund of knowledge Motivation for treatment/growth Supportive family/friends  Physician Treatment Plan for Primary Diagnosis: Bipolar affective disorder, depressed, severe ([place] Long Term Goal(s):  Improvement in symptoms so as ready for discharge  Short Term Goals: Ability to verbalize feelings will improve Ability to disclose and discuss suicidal ideas Ability to demonstrate self-control will improve Ability to identify and develop effective coping behaviors will improve Ability to maintain clinical measurements within normal limits will improve Ability to verbalize feelings will improve Ability to disclose and discuss suicidal ideas Ability to demonstrate self-control will improve Ability to identify and develop effective coping behaviors will improve Ability to maintain clinical measurements within normal limits will improve  Medication Management: Evaluate patient's response, side effects, and tolerance of medication regimen.  Therapeutic Interventions: 1 to 1 sessions, Unit Group sessions and Medication administration.  Evaluation of Outcomes: Not Met  Physician Treatment Plan for Secondary Diagnosis: Principal Problem:   Bipolar affective disorder, depressed, severe ([place]   Long Term Goal(s): Improvement in symptoms so as ready for discharge  Short Term Goals: Ability to verbalize feelings will improve Ability to disclose and discuss suicidal ideas Ability to demonstrate self-control will improve Ability to identify and develop effective coping behaviors will improve Ability to maintain clinical measurements within normal limits will improve Ability to verbalize feelings will improve Ability to disclose and discuss suicidal ideas Ability to demonstrate self-control will improve Ability to identify and develop effective coping behaviors will improve Ability to maintain clinical measurements within normal limits will improve  Medication Management: Evaluate patient's response, side effects, and tolerance of medication regimen.  Therapeutic Interventions: 1 to 1 sessions, Unit Group sessions and Medication administration.  Evaluation of Outcomes: Not Met   RN  Treatment Plan for Primary Diagnosis: Bipolar affective disorder, depressed, severe ([place]) Long Term Goal(s): Knowledge of disease and therapeutic regimen to maintain health will improve  Short Term Goals: Ability to verbalize feelings will improve, Ability to disclose and discuss suicidal ideas and Ability to identify and develop effective coping behaviors will improve  Medication Management: RN will administer medications as ordered by provider, will assess and evaluate patient's response and provide education to patient for prescribed medication. RN will report any adverse and/or side effects to prescribing provider.  Therapeutic Interventions: 1 on 1 counseling sessions, Psychoeducation, Medication administration, Evaluate responses to treatment, Monitor vital signs and CBGs as ordered, Perform/monitor CIWA, COWS, AIMS and Fall Risk screenings as ordered, Perform wound care treatments as ordered.  Evaluation of Outcomes: Not Met   LCSW Treatment Plan for Primary Diagnosis: Bipolar affective disorder, depressed, severe ([place]) Long Term Goal(s): Safe transition to appropriate next level of care at discharge, Engage patient in therapeutic group addressing interpersonal concerns.  Short Term Goals: Engage patient in aftercare planning with referrals and resources, Identify triggers associated with mental health/substance abuse issues and Increase skills for wellness and recovery  Therapeutic Interventions: Assess for all discharge needs, 1 to 1 time with Social worker, Explore available resources and support systems, Assess for adequacy in community support network, Educate family and significant other(s) on suicide prevention, Complete Psychosocial Assessment, Interpersonal group therapy.  Evaluation of Outcomes: Not Met   Progress in Treatment: Attending groups: Pt is new to milieu, continuing to assess  Participating in groups: Pt is new to milieu, continuing to assess  Taking medication as  prescribed: Yes, MD continues to assess for medication changes as needed Toleration medication: Yes, no side effects reported at this time Family/Significant other contact made: No, Pt declines Patient understands diagnosis: Continuing to assess Discussing patient identified problems/goals with staff: Yes Medical problems stabilized or resolved: Yes Denies suicidal/homicidal ideation: No, recently admitted with SI Issues/concerns per patient self-inventory: None Other: N/A  New problem(s) identified: None identified at this time.   New Short Term/Long Term Goal(s): None identified at this time.   Discharge Plan or Barriers: Pt will return home and follow-up with outpatient services.   Reason for Continuation of Hospitalization: Anxiety Depression Hallucinations Medication stabilization Suicidal ideation  Estimated Length of Stay: 3-5 days  Attendees: Patient: 08/28/2016  12:32 PM  Physician: Dr. Parke Poisson 08/28/2016  12:32 PM  Nursing: Mayra Neer, RN; Desma Paganini, RN 08/28/2016  12:32 PM  RN Care Manager: Lars Pinks, RN 08/28/2016  12:32 PM  Social Worker: Adriana Reams, LCSW; [place], LCSW 08/28/2016  12:32 PM  Recreational Therapist:  08/28/2016  12:32 PM  Other: Lindell Spar, NP; Samuel Jester, NP 08/28/2016  12:32 PM  Other:  08/28/2016  12:32 PM  Other: 08/28/2016  12:32 PM    Scribe for Treatment Team: Gladstone Lighter, LCSW 08/28/2016 12:32 PM

## 2016-08-28 NOTE — Progress Notes (Signed)
Pt is a new admit to the unit this evening just before shift change.  Pt states she needs to get back on  meds as she has been having suicidal thoughts and difficulty sleeping.  She said she has been hearing noises as if someone is chattering in her head, but she can't make out what they are saying.  Pt is diabetic and on Lantus in the evenings.  She did not want to take the Lantus as she missed dinner [facility].  She contracts for safety.  She denies HI.  Pt has been pleasant and cooperative.  She was given a salad and beverage for dinner.  She was encouraged to make her needs known to staff.  Writer did medication teaching with her for the meds she was ordered for the night.  Support and encouragement offered.  Discharge plans are in process.  Safety maintained with q15 minute checks.
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
## 2016-08-28 NOTE — Progress Notes (Signed)
Pt was in the bed at the beginning of the shift.  When writer went to her room, she stated that she had not been able to sleep for a couple of days and has been trying to "catch up" on her sleep.  She apologized for being in the bed most of the day, but felt that it was more important this first day to do that so that she could be more alert in groups tomorrow.  She says she spoke to the doctor today and he made some med changes to her home meds.  Pt is pleasant and cooperative with Clinical research associatewriter.  She did get up for a short time before 9 o'clock and watch TV and eat a snack.  She took her medications about 2115 and went back to bed.  Pt makes her needs known to staff.  She voiced no needs or concerns this evening.  Support and encouragement offered.  Discharge plans are in process.  Safety maintained with q15 minute checks.

## 2016-08-28 NOTE — Progress Notes (Signed)
Psychoeducational Group Note  Date:  08/28/2016 Time:  2329  Group Topic/Focus:  Wrap-Up Group:   The focus of this group is to help patients review their daily goal of treatment and discuss progress on daily workbooks.  Participation Level: Did Not Attend  Participation Quality:  Not Applicable  Affect:  Not Applicable  Cognitive:  Not Applicable  Insight:  Not Applicable  Engagement in Group: Not Applicable  Additional Comments:  The patient did not attend group this evening since she remained in her bed.   Hazle CocaGOODMAN, Zebulen Simonis S 08/28/2016, 11:29 PM

## 2016-08-28 NOTE — Progress Notes (Signed)
D:Pt has a flat/sad affect with minimal interaction. Pt is resistant to care not going to group and taking a long time to get out of bed for her medications. Pt reports that she has auditory hallucinations of "chattering." A:Offered support, encouragement and 15 minute checks. R:Pt denies si and hi. Safety maintained on the unit.

## 2016-08-28 NOTE — [facility] Suicide Risk Assessment (Signed)
[facility] INPATIENT:  Family/Significant Other Suicide Prevention Education  Suicide Prevention Education:  Patient Refusal for Family/Significant Other Suicide Prevention Education: The patient Nancy [name] has refused to provide written consent for family/significant other to be provided Family/Significant Other Suicide Prevention Education during admission and/or prior to discharge.  Physician notified.  Verdene LennertLauren C Other Atienza 08/28/2016, 12:33 PM

## 2016-08-28 NOTE — Progress Notes (Signed)
Recreation Therapy Notes  Date: 08/28/16 Time: 0930 Location: 300 Hall Group Room  Group Topic: Stress Management  Goal Area(s) Addresses:  Patient will verbalize importance of using healthy stress management.  Patient will identify positive emotions associated with healthy stress management.   Intervention: Stress Management  Activity :  Guided Imagery.  LRT introduced the stress management technique of guided imagery.  LRT read a script to allow patients the opportunity to engage in the activity.  Patients were to follow along as LRT read script.  Education:  Stress Management, Discharge Planning.   Education Outcome: Acknowledges edcuation/In group clarification offered/Needs additional education  Clinical Observations/Feedback: Pt did not attend group.   Caroll RancherMarjette Bret Stamour, LRT/CTRS         Lillia AbedLindsay, Jeilyn Reznik A 08/28/2016 3:10 PM
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
## 2016-08-28 NOTE — [facility] Group Notes (Signed)
[facility] LCSW Group Therapy 08/28/2016 1:15 PM  Type of Therapy: Group Therapy- Emotion Regulation  Pt did not attend, declined invitation.   Vernie ShanksLauren Bronx Brogden, LCSW 08/28/2016 4:06 PM
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
## 2016-08-29 LAB — LIPID PANEL
CHOL/HDL RATIO: 4.7 ratio
CHOLESTEROL: 160 mg/dL (ref 0–200)
HDL: 34 mg/dL — ABNORMAL LOW (ref >40)
LDL Cholesterol: 89 mg/dL (ref 0–99)
Triglycerides: 187 mg/dL — ABNORMAL HIGH (ref <150)
VLDL: 37 mg/dL (ref 0–40)

## 2016-08-29 LAB — GLUCOSE, CAPILLARY
GLUCOSE-CAPILLARY: 146 mg/dL — AB (ref 65–99)
GLUCOSE-CAPILLARY: 162 mg/dL — AB (ref 65–99)

## 2016-08-29 MED ORDER — HYDROXYZINE HCL 25 MG PO TABS
25.0000 mg | ORAL_TABLET | Freq: Four times a day (QID) | ORAL | Status: DC | PRN
  Administered 2016-08-29: 25 mg via ORAL
  Filled 2016-08-29: qty 1

## 2016-08-29 MED ORDER — LORAZEPAM 0.5 MG PO TABS
0.5000 mg | ORAL_TABLET | Freq: Three times a day (TID) | ORAL | Status: DC | PRN
  Administered 2016-08-29 – 2016-08-30 (×2): 0.5 mg via ORAL
  Filled 2016-08-29 (×2): qty 1

## 2016-08-29 NOTE — Progress Notes (Signed)
Adult Psychoeducational Group Note  Date:  08/29/2016 Time:  8:39 PM  Group Topic/Focus:  Wrap-Up Group:   The focus of this group is to help patients review their daily goal of treatment and discuss progress on daily workbooks.  Participation Level:  Minimal  Participation Quality:  Appropriate  Affect:  Flat  Cognitive:  Appropriate  Insight: Lacking  Engagement in Group:  Engaged  Modes of Intervention:  Discussion  Additional Comments:  Pt stated "you can skip me" and did not want to share during group. Later pt stated she was upset over what to watch on TV. Pt was encouraged to make her needs known to staff.  Caswell CorwinOwen, Agueda Houpt C 08/29/2016, 8:39 PM

## 2016-08-29 NOTE — Progress Notes (Signed)
DAR NOTE: Patient presents with anxious affect and depressed mood.  Denies pain, auditory and visual hallucinations.  Rates depression at 8, hopelessness at 2, and anxiety at 8.  Maintained on routine safety checks.  Medications given as prescribed.  Support and encouragement offered as needed. Patient observed socializing with peers in the dayroom.  Offered no complaint.

## 2016-08-29 NOTE — [facility] Group Notes (Signed)
[facility] 08/29/2016 1:15pm  Type of Therapy: Mental [employer] Presentation  Participation Level: Active  Participation Quality: Attentive  Affect: Appropriate  Cognitive: Oriented  Insight: Developing/Improving  Engagement in Therapy: Engaged  Modes of Intervention: Discussion, Education and Socialization  Summary of Progress/Problems: Mental [employer] Association (MHA) Speaker came to talk about his personal journey with substance abuse and addiction. The pt processed ways by which to relate to the speaker. MHA speaker provided handouts and educational information pertaining to groups and services offered by the [facility]. Pt was engaged in speaker's presentation and was receptive to resources provided.    Vernie ShanksLauren Amonte Brookover, LCSW 08/29/2016 1:28 PM
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
## 2016-08-30 LAB — HEMOGLOBIN A1C
Hgb A1c MFr Bld: 8.2 % — ABNORMAL HIGH (ref 4.8–5.6)
Mean Plasma Glucose: 189 mg/dL

## 2016-08-30 LAB — GLUCOSE, CAPILLARY
GLUCOSE-CAPILLARY: 188 mg/dL — AB (ref 65–99)
GLUCOSE-CAPILLARY: 180 mg/dL — AB (ref 65–99)

## 2016-08-30 LAB — PROLACTIN: Prolactin: 12.7 ng/mL (ref 4.8–23.3)

## 2016-08-30 MED ORDER — ZOLPIDEM TARTRATE 5 MG PO TABS: 5.0000 mg | ORAL_TABLET | Freq: Every evening | ORAL | 0 refills | Status: DC | PRN

## 2016-08-30 MED ORDER — LURASIDONE HCL 40 MG PO TABS
40.0000 mg | ORAL_TABLET | Freq: Every day | ORAL | 0 refills | Status: DC
Start: 2016-08-30 — End: 2018-02-28

## 2016-08-30 MED ORDER — NICOTINE 21 MG/24HR TD PT24: 21.0000 mg | MEDICATED_PATCH | Freq: Every day | TRANSDERMAL | 0 refills | Status: DC

## 2016-08-30 MED ORDER — NICOTINE 21 MG/24HR TD PT24
21.0000 mg | MEDICATED_PATCH | Freq: Every day | TRANSDERMAL | Status: DC
  Administered 2016-08-30: 21 mg via TRANSDERMAL
  Filled 2016-08-30 (×4): qty 1

## 2016-08-30 MED ORDER — LAMOTRIGINE 25 MG PO TABS: 25.0000 mg | ORAL_TABLET | Freq: Every day | ORAL | 0 refills | Status: DC

## 2016-08-30 MED ORDER — VENLAFAXINE HCL ER 75 MG PO CP24: 75.0000 mg | ORAL_CAPSULE | Freq: Every day | ORAL | 0 refills | Status: DC

## 2016-08-30 MED ORDER — INSULIN GLARGINE 100 UNIT/ML ~~LOC~~ SOLN: 10.0000 [IU] | Freq: Every day | SUBCUTANEOUS | 11 refills | Status: DC

## 2016-08-30 NOTE — Tx Team (Signed)
Interdisciplinary Treatment and Diagnostic Plan Update  08/30/2016 Time of Session: 11:43 AM  Nancy [name] MRN: [id number]  Principal Diagnosis: Bipolar affective disorder, depressed, severe (HCC)  Secondary Diagnoses: Principal Problem:   Bipolar affective disorder, depressed, severe (HCC)   Current Medications:  Current Facility-Administered Medications  Medication Dose Route Frequency Provider Last Rate Last Dose  . acetaminophen (TYLENOL) tablet 650 mg  650 mg Oral Q6H PRN Charm Rings, NP   650 mg at 08/27/16 1905  . insulin glargine (LANTUS) injection 10 Units  10 Units Subcutaneous QHS Charm Rings, NP   10 Units at 08/29/16 2054  . lamoTRIgine (LAMICTAL) tablet 25 mg  25 mg Oral Daily Charm Rings, NP   25 mg at 08/30/16 0759  . LORazepam (ATIVAN) tablet 0.5 mg  0.5 mg Oral Q8H PRN Craige Cotta, MD   0.5 mg at 08/30/16 0759  . lurasidone (LATUDA) tablet 40 mg  40 mg Oral QHS Charm Rings, NP   40 mg at 08/29/16 2051  . nicotine (NICODERM CQ - dosed in mg/24 hours) patch 21 mg  21 mg Transdermal Daily Craige Cotta, MD   21 mg at 08/30/16 0911  . venlafaxine XR (EFFEXOR-XR) 24 hr capsule 75 mg  75 mg Oral Q breakfast Charm Rings, NP   75 mg at 08/30/16 0759  . zolpidem (AMBIEN) tablet 5 mg  5 mg Oral QHS PRN Craige Cotta, MD   5 mg at 08/29/16 2051    PTA Medications: Prescriptions Prior to Admission  Medication Sig Dispense Refill Last Dose  . ondansetron (ZOFRAN ODT) 4 MG disintegrating tablet 4mg  ODT q4 hours prn nausea/vomit (Patient not taking: Reported on 08/27/2016) 12 tablet 0 Not Taking at Unknown time    Treatment Modalities: Medication Management, Group therapy, Case management,  1 to 1 session with clinician, Psychoeducation, Recreational therapy.  Patient Stressors: Financial difficulties Medication change or noncompliance  Patient Strengths: Capable of independent living Wellsite geologist fund of knowledge Motivation for  treatment/growth Supportive family/friends  Physician Treatment Plan for Primary Diagnosis: Bipolar affective disorder, depressed, severe (HCC) Long Term Goal(s): Improvement in symptoms so as ready for discharge  Short Term Goals: Ability to verbalize feelings will improve Ability to disclose and discuss suicidal ideas Ability to demonstrate self-control will improve Ability to identify and develop effective coping behaviors will improve Ability to maintain clinical measurements within normal limits will improve Ability to verbalize feelings will improve Ability to disclose and discuss suicidal ideas Ability to demonstrate self-control will improve Ability to identify and develop effective coping behaviors will improve Ability to maintain clinical measurements within normal limits will improve  Medication Management: Evaluate patient's response, side effects, and tolerance of medication regimen.  Therapeutic Interventions: 1 to 1 sessions, Unit Group sessions and Medication administration.  Evaluation of Outcomes: Adequate for Discharge  Physician Treatment Plan for Secondary Diagnosis: Principal Problem:   Bipolar affective disorder, depressed, severe (HCC)   Long Term Goal(s): Improvement in symptoms so as ready for discharge  Short Term Goals: Ability to verbalize feelings will improve Ability to disclose and discuss suicidal ideas Ability to demonstrate self-control will improve Ability to identify and develop effective coping behaviors will improve Ability to maintain clinical measurements within normal limits will improve Ability to verbalize feelings will improve Ability to disclose and discuss suicidal ideas Ability to demonstrate self-control will improve Ability to identify and develop effective coping behaviors will improve Ability to maintain clinical measurements within normal limits will  improve  Medication Management: Evaluate patient's response, side effects,  and tolerance of medication regimen.  Therapeutic Interventions: 1 to 1 sessions, Unit Group sessions and Medication administration.  Evaluation of Outcomes: Adequate for Discharge   RN Treatment Plan for Primary Diagnosis: Bipolar affective disorder, depressed, severe (HCC) Long Term Goal(s): Knowledge of disease and therapeutic regimen to maintain health will improve  Short Term Goals: Ability to verbalize feelings will improve, Ability to disclose and discuss suicidal ideas and Ability to identify and develop effective coping behaviors will improve  Medication Management: RN will administer medications as ordered by provider, will assess and evaluate patient's response and provide education to patient for prescribed medication. RN will report any adverse and/or side effects to prescribing provider.  Therapeutic Interventions: 1 on 1 counseling sessions, Psychoeducation, Medication administration, Evaluate responses to treatment, Monitor vital signs and CBGs as ordered, Perform/monitor CIWA, COWS, AIMS and Fall Risk screenings as ordered, Perform wound care treatments as ordered.  Evaluation of Outcomes: Adequate for Discharge   LCSW Treatment Plan for Primary Diagnosis: Bipolar affective disorder, depressed, severe (HCC) Long Term Goal(s): Safe transition to appropriate next level of care at discharge, Engage patient in therapeutic group addressing interpersonal concerns.  Short Term Goals: Engage patient in aftercare planning with referrals and resources, Identify triggers associated with mental health/substance abuse issues and Increase skills for wellness and recovery  Therapeutic Interventions: Assess for all discharge needs, 1 to 1 time with Social worker, Explore available resources and support systems, Assess for adequacy in community support network, Educate family and significant other(s) on suicide prevention, Complete Psychosocial Assessment, Interpersonal group  therapy.  Evaluation of Outcomes: Adequate for Discharge   Progress in Treatment: Attending groups: Yes Participating in groups: Yes  Taking medication as prescribed: Yes, MD continues to assess for medication changes as needed Toleration medication: Yes, no side effects reported at this time Family/Significant other contact made: No, Pt declines Patient understands diagnosis: Yes AEB willingness to seek treatment Discussing patient identified problems/goals with staff: Yes Medical problems stabilized or resolved: Yes Denies suicidal/homicidal ideation: Yes Issues/concerns per patient self-inventory: None Other: N/A  New problem(s) identified: None identified at this time.   New Short Term/Long Term Goal(s): None identified at this time.   Discharge Plan or Barriers: Pt will return home and follow-up with outpatient services.   Reason for Continuation of Hospitalization: None identified at this time.   Estimated Length of Stay: 3-5 days  Attendees: Patient: 08/30/2016  11:43 AM  Physician: Dr. Jama Flavorsobos 08/30/2016  11:43 AM  Nursing: Harlin HeysPatricia Duke, Dan McCool, RN 08/30/2016  11:43 AM  RN Care Manager: Onnie BoerJennifer Clark, RN 08/30/2016  11:43 AM  Social Worker: Vernie ShanksLauren Hernando Reali, LCSW; Heather Smart, LCSW 08/30/2016  11:43 AM  Recreational Therapist:  08/30/2016  11:43 AM  Other: Armandina StammerAgnes Nwoko, NP; Gray BernhardtMay Augustin, NP 08/30/2016  11:43 AM  Other:  08/30/2016  11:43 AM  Other: 08/30/2016  11:43 AM    Scribe for Treatment Team: Verdene LennertLauren C Raylyn Carton, LCSW 08/30/2016 11:43 AM

## 2016-08-30 NOTE — Discharge Summary (Signed)
Physician Discharge Summary Note  Patient:  Georjean Toya is an 31 y.o., female MRN:  [id number] DOB:  01/01/1986 Patient phone:  [phone] (home)  Patient address:   [address] [place] [address],  Total Time spent with patient: 30 minutes  Date of Admission:  08/27/2016 Date of Discharge: 08/30/2016  Reason for Admission:  worsening depression, PTSD and developed SI  Principal Problem: Bipolar affective disorder, depressed, severe (HCC) Discharge Diagnoses: Patient Active Problem List   Diagnosis Date Noted  . Bipolar affective disorder, depressed, severe (HCC) [F31.4] 08/27/2016  . Opiate abuse, episodic [F11.10] 04/09/2015  . MDD (major depressive disorder), recurrent episode, severe (HCC) [F33.2] 04/09/2015  . Suicide attempt by other tranquilizer drug overdose (HCC) [T43.592A] 04/09/2015  . 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 Dysmenorrhea [N94.6] 07/06/2013  . Dysmenorrhea [N94.6] 06/09/2013  . Menorrhagia with regular cycle [N92.0] 06/09/2013  . Leg edema [R60.0] 10/03/2011  . Edema leg [R60.0] 10/03/2011  . Bipolar I disorder, most recent episode depressed (HCC) [F31.30] 09/26/2011  . Morbid obesity (HCC) [E66.01] 09/26/2011  . Weakness of both legs [R29.898] 09/04/2011  . Difficulty in walking(719.7) [R26.2] 09/04/2011  . 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 Anxiety   . Bipolar disorder (HCC)   . CIN I (cervical intraepithelial neoplasia I)   . Enlarged heart   . Nephrolithiasis   . OSA (obstructive sleep apnea)   . Tachycardia     Past Surgical History:  Procedure Laterality Date  . BLADDER AUGMENTATION  2001   enlargement  . DILITATION & CURRETTAGE/HYSTROSCOPY WITH THERMACHOICE ABLATION N/A 07/06/2013   Procedure: DILATATION & CURETTAGE/HYSTEROSCOPY WITH THERMACHOICE ABLATION;  Surgeon: Tilda Burrow, MD;  Location: [facility];  Service: Gynecology;  Laterality: N/A;  total therapt time- 9 minutes 2 seconds; 87C  . LAPAROSCOPIC BILATERAL SALPINGECTOMY Bilateral 07/06/2013   Procedure: LAPAROSCOPIC BILATERAL SALPINGECTOMY;  Surgeon: Tilda Burrow, MD;  Location: [facility];  Service: Gynecology;  Laterality: Bilateral;   Family History:  Family History  Problem Relation Age of Onset  . Hypertension Mother   . Depression Mother   . Diabetes Mother   . Cancer Mother     brain tumor  . Hypertension Father   . Diabetes Father   . Depression Father   . Hypertension Sister   . Diabetes Sister   . Depression Sister   . Depression Brother   . Diabetes Brother   . Hypertension Brother   . Cancer Maternal Aunt   . Cancer Paternal Aunt   . Hypertension Maternal Grandmother   . Diabetes Maternal Grandmother   . Depression Maternal Grandmother   . Hypertension Maternal Grandfather   . Diabetes Maternal Grandfather   . Depression Maternal Grandfather   . Hypertension Paternal Grandmother   . Diabetes Paternal Grandmother   . Depression Paternal Grandmother   . Hypertension Paternal Grandfather   . Diabetes Paternal Grandfather   . Depression Paternal Grandfather    Family Psychiatric  History: see HPI Social History:  History  Alcohol Use  . Yes    Comment: occasionally     History  Drug Use No    Social History   Social History  . Marital status: Single    Spouse name: N/A  . Number of children: N/A  . Years of education: N/A   Social History Main Topics  . Smoking  status: Current Every Day Smoker    Packs/day: 1.00    Types: Cigarettes  . Smokeless tobacco: Never Used  . Alcohol use Yes     Comment: occasionally  . Drug use: No  . Sexual activity: Not Currently   Other Topics Concern  . None   Social History Narrative  . None    Hospital Course:   Marko PlumeMinnie Blackstock was admitted for Bipolar affective disorder, depressed, severe (HCC) and crisis  management.  Patient was treated with medications with their indications listed below in detail under Medication List.  Medical problems were identified and treated as needed.  Home medications were restarted as appropriate.  Improvement was monitored by observation and Marko PlumeMinnie Blackstock daily report of symptom reduction.  Emotional and mental status was monitored by daily self inventory reports completed by Marko PlumeMinnie Blackstock and clinical staff.  Patient reported continued improvement, denied any new concerns.  Patient had been compliant on medications and denied side effects.  Support and encouragement was provided.         Garlan FairMinnie Blackstock was evaluated by the treatment team for stability and plans for continued recovery upon discharge.  Patient was offered further treatment options upon discharge including Residential, Intensive Outpatient and Outpatient treatment. Patient will follow up with agency listed below for medication management and counseling.  Encouraged patient to maintain satisfactory support network and home environment.  Advised to adhere to medication compliance and outpatient treatment follow up.  Prescriptions provided.       Textron IncMinnie Blackstock motivation was an integral factor for scheduling further treatment.  Employment, transportation, bed availability, health status, family support, and any pending legal issues were also considered during patient's hospital stay.  Upon completion of this admission the patient was both mentally and medically stable for discharge denying suicidal/homicidal ideation, auditory/visual/tactile hallucinations, delusional thoughts and paranoia.      Physical Findings: AIMS: Facial and Oral Movements Muscles of Facial Expression: None, normal Lips and Perioral Area: None, normal Jaw: None, normal Tongue: None, normal,Extremity Movements Upper (arms, wrists, hands, fingers): None, normal Lower (legs, knees, ankles, toes): None, normal, Trunk  Movements Neck, shoulders, hips: None, normal, Overall Severity Severity of abnormal movements (highest score from questions above): None, normal Incapacitation due to abnormal movements: None, normal Patient's awareness of abnormal movements (rate only patient's report): No Awareness, Dental Status Current problems with teeth and/or dentures?: No Does patient usually wear dentures?: No  CIWA:    COWS:     Musculoskeletal: Strength & Muscle Tone: within normal limits Gait & Station: normal Patient leans: N/A  Psychiatric Specialty Exam: Physical Exam  Nursing note and vitals reviewed. Psychiatric: She has a normal mood and affect. Her speech is normal and behavior is normal. Judgment and thought content normal. Cognition and memory are normal.    Review of Systems  All other systems reviewed and are negative.   Blood pressure (!) 152/107, pulse (!) 119, temperature 98.8 F (37.1 C), temperature source Oral, resp. rate 20, height 5\' 7"  (1.702 m), weight (!) 167.4 kg (369 lb), SpO2 99 %.Body mass index is 57.79 kg/m.    Have you used any form of tobacco in the last 30 days? (Cigarettes, Smokeless Tobacco, Cigars, and/or Pipes): Yes  Has this patient used any form of tobacco in the last 30 days? (Cigarettes, Smokeless Tobacco, Cigars, and/or Pipes) Yes, Rx given to patient  Blood Alcohol level:  Lab Results  Component Value Date   [facility] <5 08/27/2016   ETH <5 04/08/2015  Metabolic Disorder Labs:  Lab Results  Component Value Date   HGBA1C 8.2 (H) 08/29/2016   MPG 189 08/29/2016   Lab Results  Component Value Date   PROLACTIN 12.7 08/29/2016   Lab Results  Component Value Date   CHOL 160 08/29/2016   TRIG 187 (H) 08/29/2016   HDL 34 (L) 08/29/2016   CHOLHDL 4.7 08/29/2016   VLDL 37 08/29/2016   LDLCALC 89 08/29/2016   LDLCALC 42 11/04/2011    See Psychiatric Specialty Exam and Suicide Risk Assessment completed by Attending Physician prior to  discharge.  Discharge destination:  Home  Is patient on multiple antipsychotic therapies at discharge:  No   Has Patient had three or more failed trials of antipsychotic monotherapy by history:  No  Recommended Plan for Multiple Antipsychotic Therapies: NA   Allergies as of 08/30/2016      Reactions   Asa Buff (mag [buffered Aspirin] Shortness Of Breath   Aspirin Anaphylaxis, Shortness Of Breath   Tolmetin Shortness Of Breath   Prozac [fluoxetine Hcl]       Medication List    STOP taking these medications   ondansetron 4 MG disintegrating tablet Commonly known as:  ZOFRAN ODT     TAKE these medications     Indication  insulin glargine 100 UNIT/ML injection Commonly known as:  LANTUS Inject 0.1 mLs (10 Units total) into the skin at bedtime.  Indication:  Type 2 Diabetes   lamoTRIgine 25 MG tablet Commonly known as:  LAMICTAL Take 1 tablet (25 mg total) by mouth daily. Start taking on:  08/31/2016  Indication:  Manic-Depression   lurasidone 40 MG Tabs tablet Commonly known as:  LATUDA Take 1 tablet (40 mg total) by mouth at bedtime.  Indication:  mood stabilization   nicotine 21 mg/24hr patch Commonly known as:  NICODERM CQ - dosed in mg/24 hours Place 1 patch (21 mg total) onto the skin daily. Start taking on:  08/31/2016  Indication:  Nicotine Addiction   venlafaxine XR 75 MG 24 hr capsule Commonly known as:  EFFEXOR-XR Take 1 capsule (75 mg total) by mouth daily with breakfast. Start taking on:  08/31/2016  Indication:  Major Depressive Disorder   zolpidem 5 MG tablet Commonly known as:  AMBIEN Take 1 tablet (5 mg total) by mouth at bedtime as needed for sleep.  Indication:  Trouble Sleeping      Follow-up Information    [facility] Follow up.   Contact information: [address], [address]             [place], [place] [address] Phone:  [phone] Fax:  [phone]           Follow-up recommendations:  Activity:  as tol Diet:   as tol  Comments:  1.  Take all your medications as prescribed.   2.  Report any adverse side effects to outpatient provider. 3.  Patient instructed to not use alcohol or illegal drugs while on prescription medicines. 4.  In the event of worsening symptoms, instructed patient to call 911, the crisis hotline or go to nearest emergency room for evaluation of symptoms.  Signed: Lindwood Qua, NP [facility] 08/30/2016, 11:12 AM   Patient seen, Suicide Assessment Completed.  Disposition Plan Reviewed

## 2016-08-30 NOTE — Progress Notes (Signed)
Nursing Discharge Note 06/14/2016 [id number]  Data Reports sleeping poorly with PRN sleep med.  Rates depression 2/10, hopelessness 5/10, and anxiety 10/10. Affect appropriate mood "anxious."  Reports "I am anxious because I want to leave."  Denies HI, SI, AVH.  BP elevated in AM, rechecked lunch time and came down.  Denies physical concerns.  Received discharge orders.  Action  Spoke with patient 1:1, nurse offered support to patient throughout shift.  Did teaching with handout about assertive communication, and reviewed afterwards.  Reviewed medications, discharge instructions, and follow up appointments with patient. Medication samples and scripts handed to patient.  Paperwork, AVS, SRA, and transition record handed to patient.   Escorted off of unit at 1327. Belongings returned per belongings form.  Discharged to lobby, states her car is in the parking lot.    Response Verbalized understanding of assertive behavior handout- patient states "I needed it" states she admits to passive aggressive behavior based on reading the education.  Agrees to contact crisis line or 911 with thoughts/intent to harm self or others.    To follow up per AVS.

## 2016-08-30 NOTE — Progress Notes (Signed)
At the beginning of the shift, pt was in the dayroom watching TV.  She stated her day was pretty good and that she had been up all day.  She was hopeful to sleep better tonight as she says she awoke around 0300 and was not able to fully go back to sleep.  She denies SI/HI/AVH.  She seemed in a good mood at that time.  Later, during the evening group, pt got into a verbal altercation with other patients about the TV and what they would watch after group.  Pt had wanted to watch a particular program at 8 o'clock, and had spoken to the others about it earlier in the afternoon, but just before group started, the others, who had been coloring most of the afternoon, decided they wanted to watch a movie.  Pt became upset and spoke her feelings while the MHT was in the room.  The situation escalated to the point that the MHT closed the dayroom for a time.  Pt then got on the phone and talked with someone for about 15 minutes.  She then asked writer if she could take her HS meds which were given to her.  Writer apologized for the incident, and asked if there was anything else Clinical research associatewriter could do.  Pt said she may not bed able to go to sleep, and may need something later.  Pt was encouraged to let the MHT know if she needed anything and writer would bring it to her.  Pt voiced understanding.  She went to her room.  Later, the other patients apologized to her.  Support and encouragement offered.  Discharge plans are in process.  Meds are being adjusted.  Pt makes her needs known to staff.  Safety maintained with q15 minute checks.

## 2016-08-30 NOTE — Progress Notes (Signed)
Recreation Therapy Notes  Date:08/30/16 ZOXW:[id number]:0930 Location: 300 [address] Dayroom  Group Topic: Stress Management  Goal Area(s) Addresses:  Patient will verbalize importance of using healthy stress management.  Patient will identify positive emotions associated with healthy stress management.   Intervention: Stress Management  Activity: Guided Imagery. LRT introduced the stress management techniques of guided imagery. LRT read a script for patients to engage in the technique. Patients were to follow along as LRT read the script.  Education:Stress Management, Discharge Planning.   Education Outcome:Acknowledges edcuation/In group clarification offered/Needs additional education  Clinical Observations/Feedback:Pt did not attend group.    Caroll RancherMarjette Eyana Stolze, LRT/CTRS         Caroll RancherLindsay, Sharissa Brierley A 08/30/2016 12:58 PM

## 2016-08-30 NOTE — Progress Notes (Signed)
  [facility] :  Will you be returning to the same living situation after discharge:  Yes,  Pt returning home At discharge, do you have transportation home?: Yes,  Pt family to pick up Do you have the ability to pay for your medications: Yes,  Pt provided with samples and prescriptions  Release of information consent forms completed and in the chart;  Patient's signature needed at discharge.  Patient to Follow up at: Follow-up Information    [facility] Follow up on 09/09/2016.   Why:  at 2:00pm for your initial assessment for services with this provider that you requested. It is important that you arrive at 1:45pm.  Contact information: [address], [address]             [place], [place] [address] Phone:  [phone] Fax:  [phone]           Next level of care provider [facility] Link:no  Safety Planning and Suicide Prevention discussed: Yes,  with Pt; declined family contact  Have you used any form of tobacco in the last 30 days? (Cigarettes, Smokeless Tobacco, Cigars, and/or Pipes): Yes  Has patient been referred to the Quitline?: Patient refused referral  Patient has been referred for addiction treatment: Yes  Verdene LennertLauren C Kaleea Penner 08/30/2016, 11:46 AM

## 2016-08-30 NOTE — [facility] Suicide Risk Assessment (Signed)
[facility] Discharge Suicide Risk Assessment   Principal Problem: Bipolar affective disorder, depressed, severe (HCC) Discharge Diagnoses:  Patient Active Problem List   Diagnosis Date Noted  . Bipolar affective disorder, depressed, severe (HCC) [F31.4] 08/27/2016  . Opiate abuse, episodic [F11.10] 04/09/2015  . MDD (major depressive disorder), recurrent episode, severe (HCC) [F33.2] 04/09/2015  . Suicide attempt by other tranquilizer drug overdose (HCC) [T43.592A] 04/09/2015  . 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 Dysmenorrhea [N94.6] 07/06/2013  . Dysmenorrhea [N94.6] 06/09/2013  . Menorrhagia with regular cycle [N92.0] 06/09/2013  . Leg edema [R60.0] 10/03/2011  . Edema leg [R60.0] 10/03/2011  . Bipolar I disorder, most recent episode depressed (HCC) [F31.30] 09/26/2011  . Morbid obesity (HCC) [E66.01] 09/26/2011  . Weakness of both legs [R29.898] 09/04/2011  . Difficulty in walking(719.7) [R26.2] 09/04/2011  . 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 rate 20, height 5\' 7"  (1.702 m), weight (!) 167.4 kg (369 lb), SpO2 99 %.Body mass index is 57.79 kg/m.  General Appearance: improved grooming   Eye Contact::  Good  Speech:  Normal Rate409  Volume:  Normal  Mood:  improved , states she is feeling better , describes mood as a " 7/10".   Affect:  Appropriate and more reactive, smiles appropriately during session  Thought Process:  Linear  Orientation:  Full (Time, Place, and Person)  Thought Content:  no hallucinations, no  delusions, not internally preoccupied   Suicidal Thoughts:  No denies any suicidal or self injurious ideations, no homicidal or violent ideations   Homicidal Thoughts:  No  Memory:  recent and remote grossly intact   Judgement:  Other:  improved   Insight:  improved  Psychomotor Activity:  Normal  Concentration:  Good  Recall:  Good  Fund of Knowledge:Good  Language: Good  Akathisia:  Negative  Handed:  Right  AIMS (if indicated):     Assets:  Communication Skills Desire for Improvement Resilience  Sleep:  Number of Hours: 6.75  Cognition: WNL  ADL's:  Intact   Mental Status Per Nursing Assessment::   On Admission:  Suicidal ideation indicated by patient  Demographic Factors:  31 year old female, lives with BF, no children, currently unemployed   Loss Factors: Relationship issues- states, however, that she has had good conversations with BF since her admission and that " things are a lot better between [place] now".   Historical Factors: Prior suicidal attempt in 2016, has been diagnosed with Bipolar Disorder in the past, history of vague hallucinations during periods of severe depression.   Risk Reduction Factors:   Sense of responsibility to family, Living with another person, especially a relative and Positive coping skills or problem solving skills  Continued Clinical Symptoms:  At this time patient is alert, attentive, well related,grooming is improved, mood is significantly improved, and presents with a fuller, more reactive affect, no thought disorder, no suicidal or self injurious ideations, no homicidal or violent ideations, states auditory hallucinations, which she described as a " chatter" has significantly improved.No currently symptoms or presentation of psychosis. She is future oriented, and  states she is going to start applying for jobs, because " I really want to start working again". She is tolerating medications well , denies side effects.  Cognitive Features  That Contribute To Risk:  No gross cognitive deficits noted upon discharge. Is alert , attentive, and oriented x 3   Suicide Risk:  Mild:  Suicidal ideation of limited frequency, intensity, duration, and specificity.  There are no identifiable plans, no associated intent, mild dysphoria and related symptoms, good self-control (both objective and subjective assessment), few other risk factors, and identifiable protective factors, including available and accessible social support.  Follow-up Information    [facility] Follow up on 09/09/2016.   Why:  at 2:00pm for your initial assessment for services with this provider that you requested. It is important that you arrive at 1:45pm.  Contact information: [address]             [place], [place] [address] Phone:  [phone] Fax:  [phone]           Plan Of Care/Follow-up recommendations:  Activity:  as tolerated Diet:  heart healthy, diabetic diet Tests:  NA Other:  See below  Patient is requesting discharge and there are currently no grounds for involuntary commitment  She plans to return to live with her BF Follow up as above  Has an established PCP , Dr. Eloise LevelsBurdyne , in [place] - plans to follow up for diabetic management, other medical health issues .  Nehemiah MassedOBOS, Mera Gunkel, MD 08/30/2016, 12:09 PM

## 2016-09-24 ENCOUNTER — Emergency Department (HOSPITAL_COMMUNITY)
Admission: EM | Admit: 2016-09-24 | Discharge: 2016-09-25 | Disposition: A | Discharge status: Home / Self Care | Payer: Medicaid - Out of State | Attending: Emergency Medicine | Admitting: Emergency Medicine

## 2016-09-24 ENCOUNTER — Encounter (HOSPITAL_COMMUNITY): Payer: Self-pay | Admitting: Emergency Medicine

## 2016-09-24 ENCOUNTER — Emergency Department (HOSPITAL_COMMUNITY): Payer: Medicaid - Out of State

## 2016-09-24 DIAGNOSIS — R52 Pain, unspecified: Secondary | ICD-10-CM

## 2016-09-24 DIAGNOSIS — F17210 Nicotine dependence, cigarettes, uncomplicated: Secondary | ICD-10-CM | POA: Insufficient documentation

## 2016-09-24 DIAGNOSIS — R102 Pelvic and perineal pain: Secondary | ICD-10-CM

## 2016-09-24 DIAGNOSIS — Z794 Long term (current) use of insulin: Secondary | ICD-10-CM | POA: Insufficient documentation

## 2016-09-24 DIAGNOSIS — R1032 Left lower quadrant pain: Secondary | ICD-10-CM

## 2016-09-24 DIAGNOSIS — R112 Nausea with vomiting, unspecified: Secondary | ICD-10-CM | POA: Insufficient documentation

## 2016-09-24 LAB — I-STAT CHEM 8, ED
BUN: 9 mg/dL (ref 6–20)
CREATININE: 0.6 mg/dL (ref 0.44–1.00)
Calcium, Ion: 1.2 mmol/L (ref 1.15–1.40)
Chloride: 105 mmol/L (ref 101–111)
Glucose, Bld: 179 mg/dL — ABNORMAL HIGH (ref 65–99)
HEMATOCRIT: 39 % (ref 36.0–46.0)
HEMOGLOBIN: 13.3 g/dL (ref 12.0–15.0)
POTASSIUM: 3.7 mmol/L (ref 3.5–5.1)
SODIUM: 142 mmol/L (ref 135–145)
TCO2: 25 mmol/L (ref 0–100)

## 2016-09-24 LAB — URINALYSIS, ROUTINE W REFLEX MICROSCOPIC
Bilirubin Urine: NEGATIVE
GLUCOSE, UA: NEGATIVE mg/dL
HGB URINE DIPSTICK: NEGATIVE
Ketones, ur: NEGATIVE mg/dL
Nitrite: NEGATIVE
Protein, ur: NEGATIVE mg/dL
SPECIFIC GRAVITY, URINE: 1.024 (ref 1.005–1.030)
pH: 5 (ref 5.0–8.0)

## 2016-09-24 LAB — CBC WITH DIFFERENTIAL/PLATELET
BASOS ABS: 0 10*3/uL (ref 0.0–0.1)
BASOS PCT: 0 %
Eosinophils Absolute: 0.1 10*3/uL (ref 0.0–0.7)
Eosinophils Relative: 2 %
HEMATOCRIT: 38.9 % (ref 36.0–46.0)
HEMOGLOBIN: 13.2 g/dL (ref 12.0–15.0)
LYMPHS PCT: 51 %
Lymphs Abs: 3.8 10*3/uL (ref 0.7–4.0)
MCH: 28.9 pg (ref 26.0–34.0)
MCHC: 33.9 g/dL (ref 30.0–36.0)
MCV: 85.1 fL (ref 78.0–100.0)
MONO ABS: 0.3 10*3/uL (ref 0.1–1.0)
Monocytes Relative: 5 %
NEUTROS ABS: 3.2 10*3/uL (ref 1.7–7.7)
NEUTROS PCT: 42 %
Platelets: 244 10*3/uL (ref 150–400)
RBC: 4.57 MIL/uL (ref 3.87–5.11)
RDW: 12.6 % (ref 11.5–15.5)
WBC: 7.5 10*3/uL (ref 4.0–10.5)

## 2016-09-24 LAB — I-STAT BETA HCG BLOOD, ED (MC, WL, AP ONLY)

## 2016-09-24 IMAGING — CT CT RENAL STONE PROTOCOL
2 of 4 series · 16 of 46 positions shown, 18 images · non-contrast
Comparison: CT of the abdomen and pelvis from [DATE]

CLINICAL DATA: Acute onset of left lower quadrant abdominal pain,
radiating to the back. Vomiting. Initial encounter.

EXAM:
CT ABDOMEN AND PELVIS WITHOUT CONTRAST
TECHNIQUE: Multidetector CT imaging of the abdomen and pelvis was performed
following the standard protocol without IV contrast.

[Series 2: axial st · axial · 0.98mm/px · z∈[-612,-172]mm · 13 of 98 slices shown, 15 images]
[im 5/98  soft-tissue]
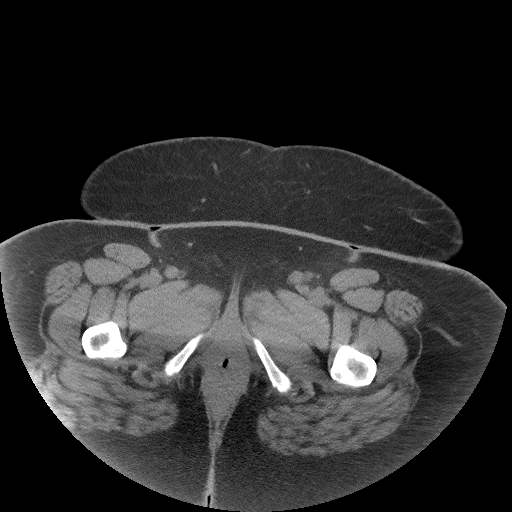
[im 5/98  bone]
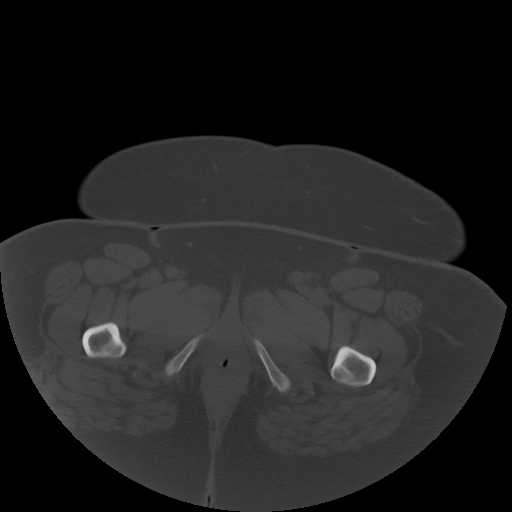
[im 14/98  soft-tissue]
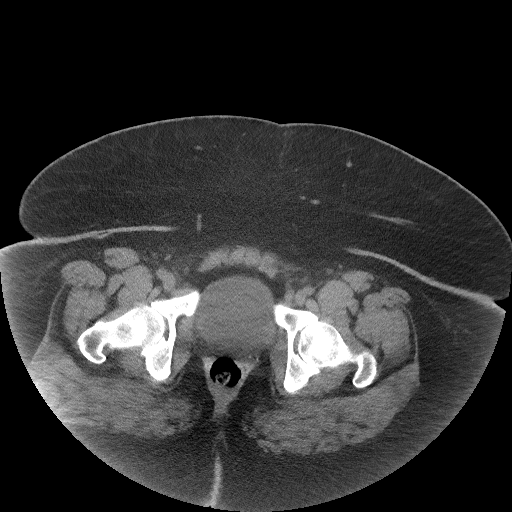
[im 19/98  soft-tissue]
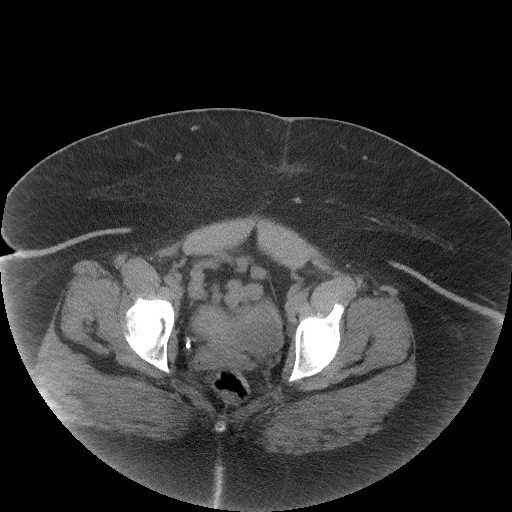
[im 28/98  soft-tissue]
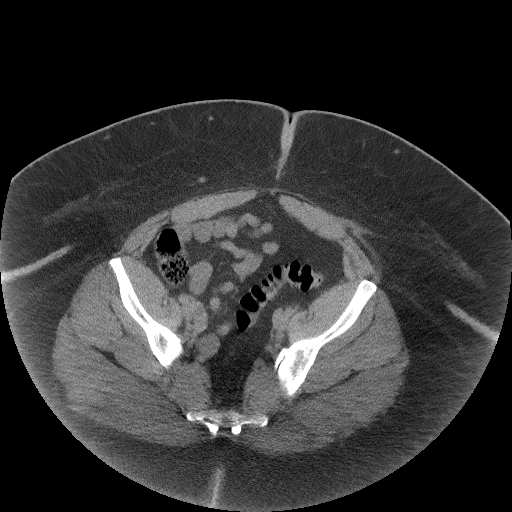
[im 33/98  soft-tissue]
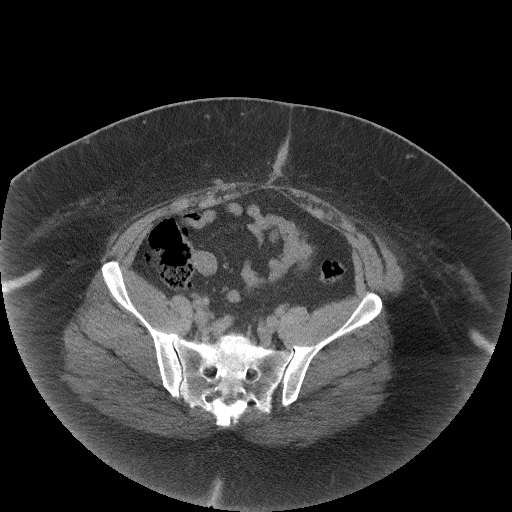
[im 42/98  soft-tissue]
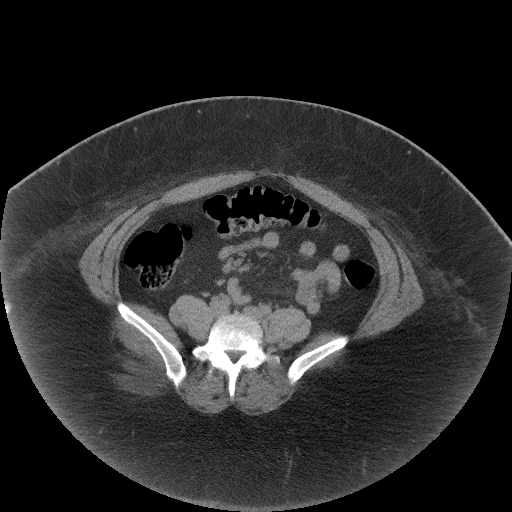
[im 51/98  soft-tissue]
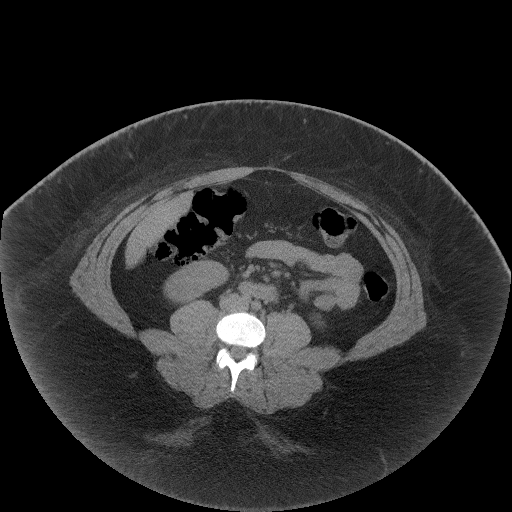
[im 56/98  soft-tissue]
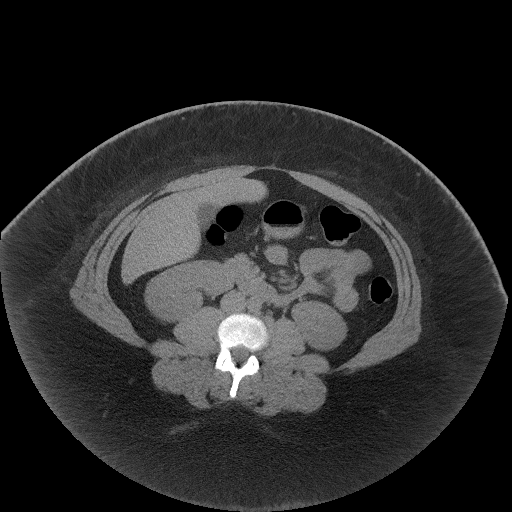
[im 65/98  soft-tissue]
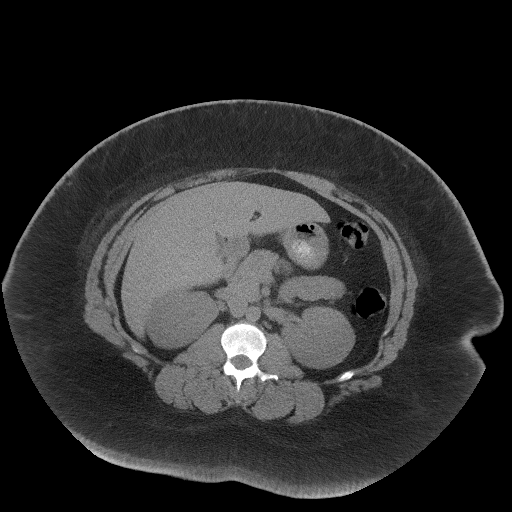
[im 65/98  bone]
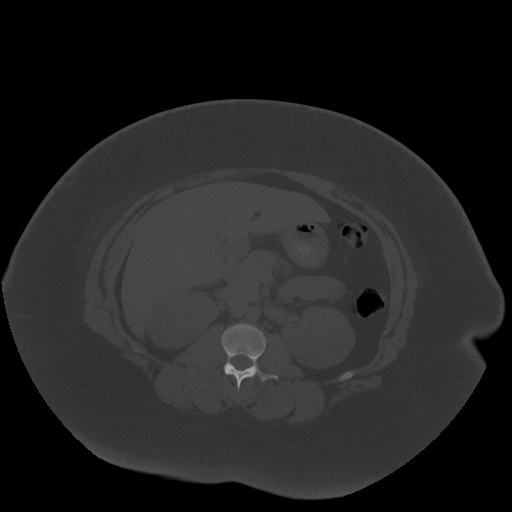
[im 70/98  soft-tissue]
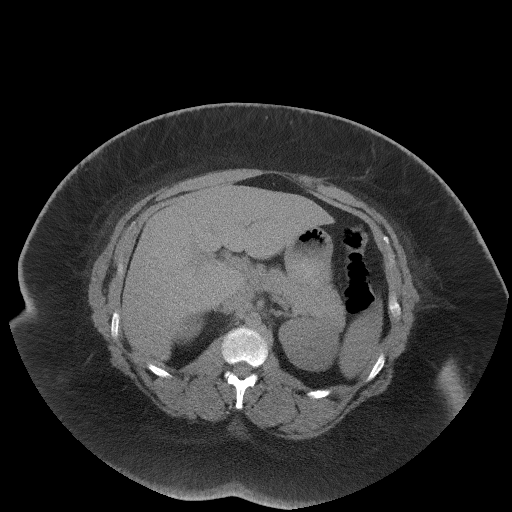
[im 79/98  soft-tissue]
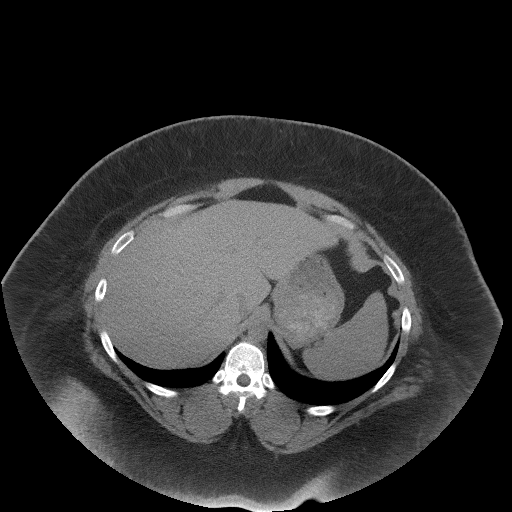
[im 84/98  soft-tissue]
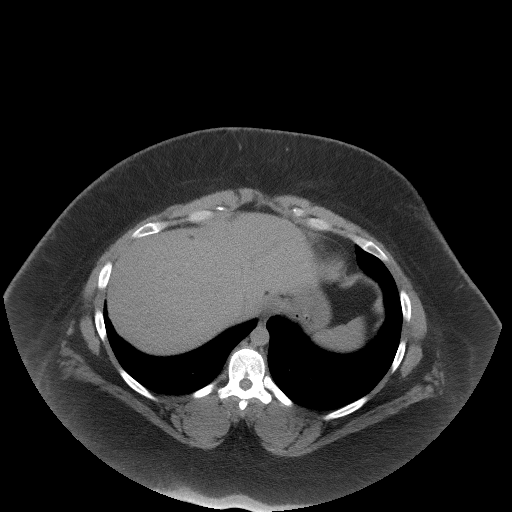
[im 93/98  soft-tissue]
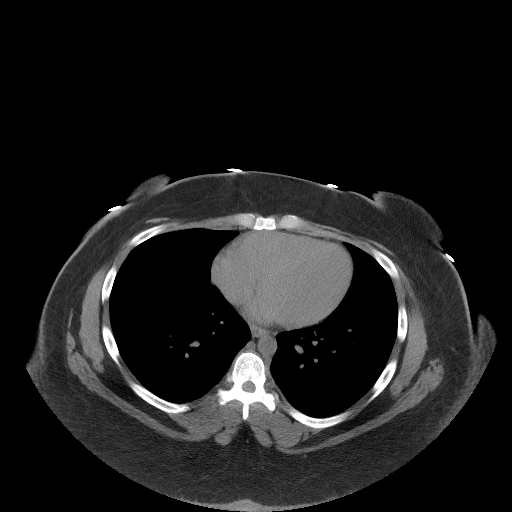

[Series 5: coronal st · coronal · 0.82mm/px · 3 of 121 slices shown]
[im 41/121  soft-tissue]
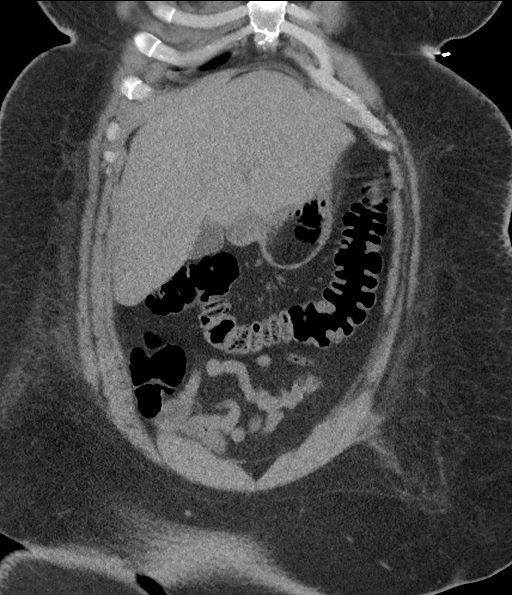
[im 54/121  soft-tissue]
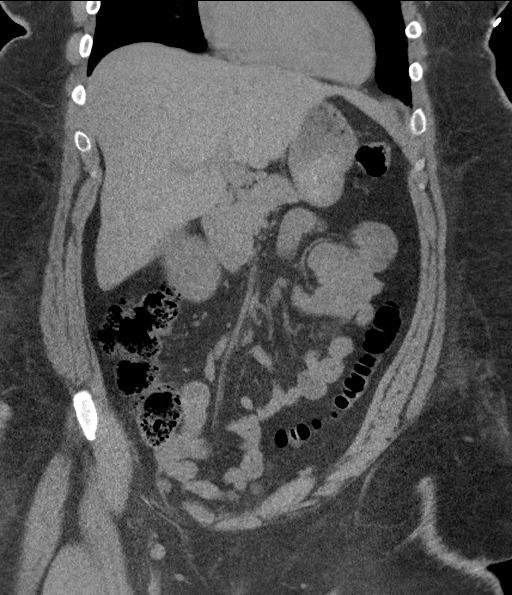
[im 67/121  soft-tissue]
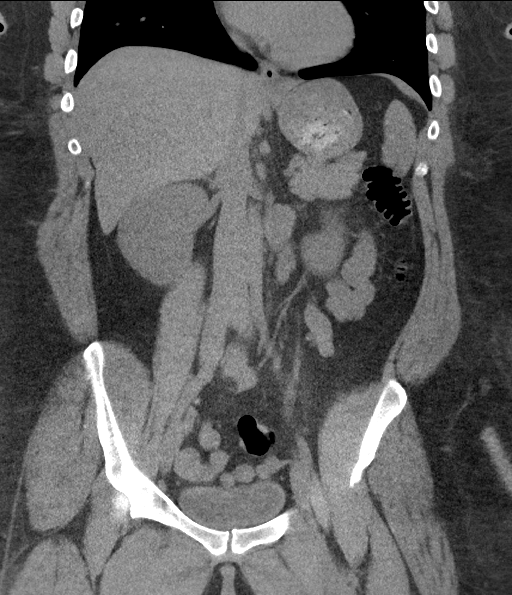

[16 of 46 positions shown; findings below may reference images not displayed]

FINDINGS: Lower chest: The visualized lung bases are grossly clear. The
visualized portions of the mediastinum are unremarkable.

Hepatobiliary: The liver is unremarkable in appearance. The
gallbladder is unremarkable in appearance. The common bile duct
remains normal in caliber.

Pancreas: The pancreas is within normal limits.

Spleen: The spleen is unremarkable in appearance.

Adrenals/Urinary Tract: The adrenal glands are unremarkable in
appearance. The kidneys are within normal limits. There is no
evidence of hydronephrosis. No renal or ureteral stones are
identified. No perinephric stranding is seen.

Stomach/Bowel: The stomach is unremarkable in appearance. The small
bowel is within normal limits. The appendix is normal in caliber,
without evidence of appendicitis. The colon is unremarkable in
appearance.

Vascular/Lymphatic: The abdominal aorta is unremarkable in
appearance. The inferior vena cava is grossly unremarkable. No
retroperitoneal lymphadenopathy is seen. No pelvic sidewall
lymphadenopathy is identified.

Scattered mildly prominent mesenteric nodes are nonspecific.

Reproductive: The bladder is mildly distended and within normal
limits. The uterus is grossly unremarkable in appearance. The
ovaries are relatively symmetric. No suspicious adnexal masses are
seen.

Other: No additional soft tissue abnormalities are seen.

Musculoskeletal: No acute osseous abnormalities are identified. The
visualized musculature is unremarkable in appearance.
IMPRESSION: 1. No acute abnormality seen within the abdomen or pelvis.
2. Mildly prominent mesenteric nodes are relatively stable from the
prior study and may reflect the patient's baseline.

## 2016-09-24 MED ORDER — HYDROMORPHONE HCL 1 MG/ML IJ SOLN
1.0000 mg | Freq: Once | INTRAMUSCULAR | Status: AC
  Administered 2016-09-24: 1 mg via INTRAVENOUS
  Filled 2016-09-24: qty 1

## 2016-09-24 MED ORDER — ONDANSETRON HCL 4 MG/2ML IJ SOLN
4.0000 mg | Freq: Once | INTRAMUSCULAR | Status: AC
  Administered 2016-09-24: 4 mg via INTRAVENOUS
  Filled 2016-09-24: qty 2

## 2016-09-24 NOTE — ED Provider Notes (Signed)
[facility]-EMERG[facility]Y DEPT Provider Note   CSN: [id number] Arrival date & time: 09/24/16  2119   By signing my name below, I, Bobbie Stackhristopher Reid, attest that this documentation has been prepared under the direction and in the presence of Bethann BerkshireJoseph Nevayah Faust, MD. Electronically Signed: Bobbie Stackhristopher Reid, Scribe. 09/24/16. 10:27 PM. History   Chief Complaint Chief Complaint  Patient presents with  . Abdominal Pain     HPI Comments: Nancy [name] is a 31 y.o. female who presents to the Emergency Department complaining of sudden onset of LLQ abdominal pain that began around 2 hours ago. She states that this pain radiates to her back. The patient also reports associated vomiting that began an hour ago. She reports no other symptoms. She reports no alleviating factors.   The history is provided by the patient. No language interpreter was used.  Abdominal Pain   This is a new problem. The current episode started 1 to 2 hours ago. The problem occurs constantly. The problem has been gradually worsening. The pain is associated with an unknown factor. The pain is located in the LLQ. The quality of the pain is sharp. The pain is at a severity of 8/10. The pain is severe. Associated symptoms include nausea and vomiting. Pertinent negatives include diarrhea, frequency, hematuria and headaches. Nothing aggravates the symptoms. Nothing relieves the symptoms.    Past Medical History:  Diagnosis Date  . Anxiety   . Bipolar disorder (HCC)   . CIN I (cervical intraepithelial neoplasia I)   . Enlarged heart   . Nephrolithiasis   . OSA (obstructive sleep apnea)   . Tachycardia     Patient Active Problem List   Diagnosis Date Noted  . Bipolar affective disorder, depressed, severe (HCC) 08/27/2016  . Opiate abuse, episodic 04/09/2015  . MDD (major depressive disorder), recurrent episode, severe (HCC) 04/09/2015  . Suicide attempt by other tranquilizer drug overdose (HCC) 04/09/2015  . 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 Dysmenorrhea 07/06/2013  . Dysmenorrhea 06/09/2013  . Menorrhagia with regular cycle 06/09/2013  . Leg edema 10/03/2011  . Edema leg 10/03/2011  . Bipolar I disorder, most recent episode depressed (HCC) 09/26/2011  . Morbid obesity (HCC) 09/26/2011  . Weakness of both legs 09/04/2011  . Difficulty in walking(719.7) 09/04/2011  . 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 BLADDER AUGMENTATION  2001   enlargement  . DILITATION & CURRETTAGE/HYSTROSCOPY WITH THERMACHOICE ABLATION N/A 07/06/2013   Procedure: DILATATION & CURETTAGE/HYSTEROSCOPY WITH THERMACHOICE ABLATION;  Surgeon: Tilda BurrowJohn V Ferguson, MD;  Location: [facility];  Service: Gynecology;  Laterality: N/A;  total therapt time- 9 minutes 2 seconds; 87C  . LAPAROSCOPIC BILATERAL SALPINGECTOMY Bilateral 07/06/2013   Procedure: LAPAROSCOPIC BILATERAL SALPINGECTOMY;  Surgeon: Tilda BurrowJohn V Ferguson, MD;  Location: [facility];  Service: Gynecology;  Laterality: Bilateral;    OB History    No data available       Home Medications    Prior to Admission medications   Medication Sig Start Date End Date Taking? Authorizing Provider  insulin glargine (LANTUS) 100 UNIT/ML injection Inject 0.1 mLs (10 Units total) into the skin at bedtime. Patient taking differently: Inject 20 Units into the skin at bedtime.  08/30/16  Yes Adonis BrookSheila Agustin, NP  lamoTRIgine (LAMICTAL) 25 MG tablet Take 1 tablet (25 mg total) by mouth daily. Patient not taking: Reported on 09/24/2016 08/31/16   Adonis BrookSheila Agustin, NP  lurasidone (LATUDA) 40 MG TABS tablet Take 1 tablet (  40 mg total) by mouth at bedtime. Patient not taking: Reported on 09/24/2016 08/30/16   Adonis Brook, NP  nicotine (NICODERM CQ - DOSED IN MG/24 HOURS) 21 mg/24hr patch Place 1 patch (21 mg total) onto the skin daily. Patient not taking: Reported on 09/24/2016 08/31/16   Adonis Brook, NP  venlafaxine XR (EFFEXOR-XR) 75 MG 24 hr  capsule Take 1 capsule (75 mg total) by mouth daily with breakfast. Patient not taking: Reported on 09/24/2016 08/31/16   Adonis Brook, NP  zolpidem (AMBIEN) 5 MG tablet Take 1 tablet (5 mg total) by mouth at bedtime as needed for sleep. Patient not taking: Reported on 09/24/2016 08/30/16   Adonis Brook, NP    Family History Family History  Problem Relation Age of Onset  . Hypertension Mother   . Depression Mother   . Diabetes Mother   . Cancer Mother     brain tumor  . Hypertension Father   . Diabetes Father   . Depression Father   . Hypertension Sister   . Diabetes Sister   . Depression Sister   . Depression Brother   . Diabetes Brother   . Hypertension Brother   . Cancer Maternal Aunt   . Cancer Paternal Aunt   . Hypertension Maternal Grandmother   . Diabetes Maternal Grandmother   . Depression Maternal Grandmother   . Hypertension Maternal Grandfather   . Diabetes Maternal Grandfather   . Depression Maternal Grandfather   . Hypertension Paternal Grandmother   . Diabetes Paternal Grandmother   . Depression Paternal Grandmother   . Hypertension Paternal Grandfather   . Diabetes Paternal Grandfather   . Depression Paternal Grandfather     Social History Social History  Substance Use Topics  . Smoking status: Current Every Day Smoker    Packs/day: 1.00    Types: Cigarettes  . Smokeless tobacco: Never Used  . Alcohol use Yes     Comment: occasionally     Allergies   Asa buff (mag [buffered aspirin]; Aspirin; Tolmetin; and Prozac [fluoxetine hcl]   Review of Systems Review of Systems  Constitutional: Negative for appetite change and fatigue.  HENT: Negative for congestion, ear discharge and sinus pressure.   Eyes: Negative for discharge.  Respiratory: Negative for cough.   Cardiovascular: Negative for chest pain.  Gastrointestinal: Positive for abdominal pain (LLQ), nausea and vomiting. Negative for diarrhea.  Genitourinary: Negative for frequency and  hematuria.  Musculoskeletal: Negative for back pain.  Skin: Negative for rash.  Neurological: Negative for seizures and headaches.  Psychiatric/Behavioral: Negative for hallucinations.  All other systems reviewed and are negative.    Physical Exam Updated Vital Signs BP (!) 161/117 (BP Location: Right Arm)   Pulse 108   Temp 98.4 F (36.9 C) (Oral)   Resp 20   Ht 5\' 6"  (1.676 m)   Wt (!) 350 lb (158.8 kg)   SpO2 100%   BMI 56.49 kg/m   Physical Exam  Constitutional: She is oriented to person, place, and time. She appears well-developed.  HENT:  Head: Normocephalic.  Eyes: Conjunctivae and EOM are normal. No scleral icterus.  Neck: Neck supple. No thyromegaly present.  Cardiovascular: Normal rate and regular rhythm.  Exam reveals no gallop and no friction rub.   No murmur heard. Pulmonary/Chest: No stridor. She has no wheezes. She has no rales. She exhibits no tenderness.  Abdominal: She exhibits no distension. There is tenderness. There is no rebound.  Moderate LLQ tenderness.  Musculoskeletal: Normal range of motion. She exhibits  no edema.  Lymphadenopathy:    She has no cervical adenopathy.  Neurological: She is oriented to person, place, and time. She exhibits normal muscle tone. Coordination normal.  Skin: No rash noted. No erythema.  Psychiatric: She has a normal mood and affect. Her behavior is normal.  Nursing note and vitals reviewed.   ED Treatments / Results  DIAGNOSTIC STUDIES: Oxygen Saturation is 100% on RA, normal by my interpretation.    COORDINATION OF CARE: 9:55 PM Discussed treatment plan with pt at bedside and pt agreed to plan. I will start the patient on pain medication and Zofran.  Labs (all labs ordered are listed, but only abnormal results are displayed) Labs Reviewed  I-STAT CHEM 8, ED - Abnormal; Notable for the following:       Result Value   Glucose, Bld 179 (*)    All other components within normal limits  URINALYSIS, ROUTINE W  REFLEX MICROSCOPIC  I-STAT BETA HCG BLOOD, ED ([facility], [facility], [facility] ONLY)    EKG  EKG Interpretation None       Radiology No results found.  Procedures Procedures (including critical care time)  Medications Ordered in ED Medications  HYDROmorphone (DILAUDID) injection 1 mg (1 mg Intravenous Given 09/24/16 2207)  ondansetron (ZOFRAN) injection 4 mg (4 mg Intravenous Given 09/24/16 2207)     Initial Impression / Assessment and Plan / ED Course  I have reviewed the triage vital signs and the nursing notes.  Pertinent labs & imaging results that were available during my care of the patient were reviewed by me and considered in my medical decision making (see chart for details).     abd pain,  Ct neg.  Pt referred to gyn  Final Clinical Impressions(s) / ED Diagnoses   Final diagnoses:  Pain    New Prescriptions New Prescriptions   No medications on file   The chart was scribed for me under my direct supervision.  I personally performed the history, physical, and medical decision making and all procedures in the evaluation of this patient.Bethann Berkshire, MD 10/08/16 1524

## 2016-09-24 NOTE — ED Triage Notes (Signed)
Patient complaining of lower left quadrant abdominal pain with vomiting starting approximately 2 hours prior to arrival to ER.

## 2016-09-25 ENCOUNTER — Emergency Department (HOSPITAL_COMMUNITY): Payer: Medicaid - Out of State

## 2016-09-25 MED ORDER — ONDANSETRON HCL 4 MG PO TABS: 4.0000 mg | ORAL_TABLET | Freq: Three times a day (TID) | ORAL | 0 refills | Status: DC | PRN

## 2016-09-25 NOTE — ED Provider Notes (Signed)
Care assumed from Dr. Estell HarpinZammit.  Patient awaiting pelvic US to evaluate for LLQ pain.  Ultrasound shows no evidence of left ovarian torsion. Patient resting comfortably. Continues to have mild left-sided abdominal tenderness.Exam is limited by her obesity.  Explained to patient her CT scan and ultrasound are both reassuring. Urinalysis is negative.  Discussed with patient that she may have potentially passed a kidney stone. No surgical or emergent cause of abdominal pain identified. Patient will be referred to gynecology and PCP for follow-up. Return precautions discussed. BP 127/94   Pulse 100   Temp 98.4 F (36.9 C) (Oral)   Resp 18   Ht 5\' 6"  (1.676 m)   Wt (!) 350 lb (158.8 kg)   SpO2 100%   BMI 56.49 kg/m     Nancy [name] Nancy Hall, MD 09/25/16 [phone]

## 2016-09-25 NOTE — Discharge Instructions (Signed)
Your CT and US are reassuring. Follow up with the gynecologist. Return to the ED if you develop new or worsening symptoms.

## 2016-09-25 NOTE — ED Notes (Signed)
Pt given ginger ale.

## 2016-09-26 LAB — GC/CHLAMYDIA PROBE AMP (~~LOC~~) NOT AT ARMC
Chlamydia: NEGATIVE
NEISSERIA GONORRHEA: NEGATIVE

## 2016-11-26 IMAGING — US US ART/VEN ABD/PELV/SCROTUM DOPPLER LTD
1 series · 13 of 25 positions shown · non-contrast
Comparison: CT of the abdomen and pelvis from [DATE]

CLINICAL DATA: Acute onset of left lower quadrant abdominal pain,
radiating to the back. Vomiting. Assess for ovarian torsion. Initial
encounter.

EXAM:
TRANSABDOMINAL AND TRANSVAGINAL ULTRASOUND OF PELVIS
DOPPLER ULTRASOUND OF OVARIES
TECHNIQUE: Both transabdominal and transvaginal ultrasound examinations of the
pelvis were performed. Transabdominal technique was performed for
global imaging of the pelvis including uterus, ovaries, adnexal
regions, and pelvic cul-de-sac.
It was necessary to proceed with endovaginal exam following the
transabdominal exam to visualize the uterus and ovaries in greater
detail. Color and duplex Doppler ultrasound was utilized to evaluate
blood flow to the ovaries.

[Series 1: us art/ven abd/pelv/scrotum doppler ltd · 0.28mm/px · 13 of 52 slices shown]
[im 1/52]
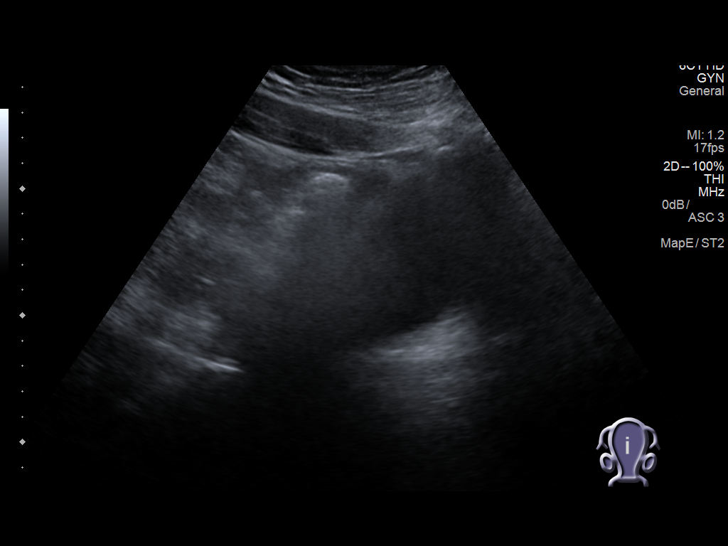
[im 5/52]
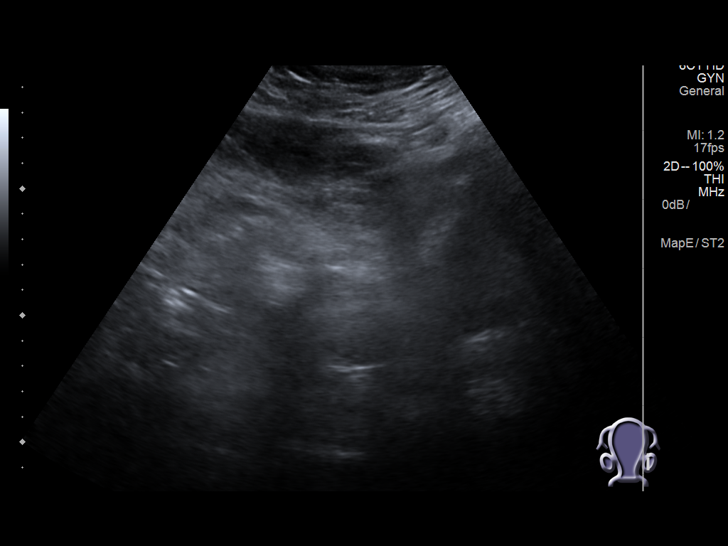
[im 9/52]
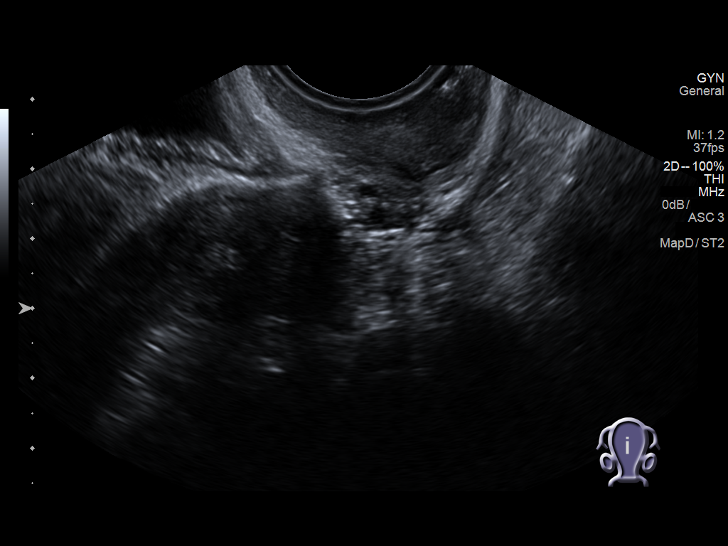
[im 13/52]
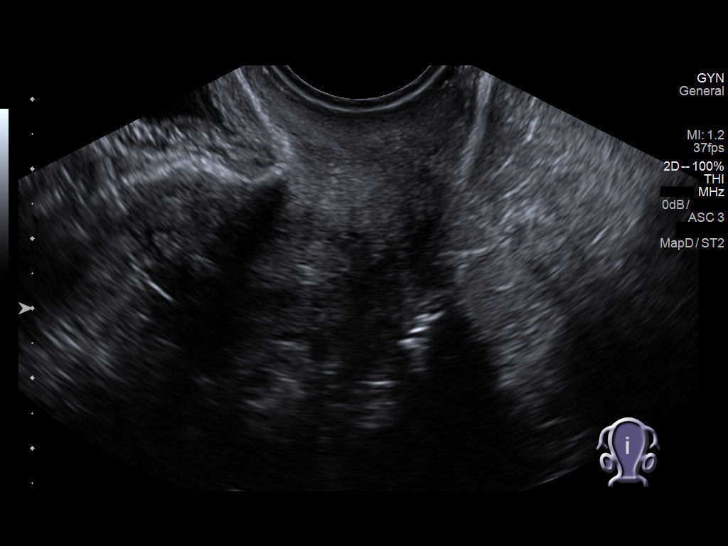
[im 18/52]
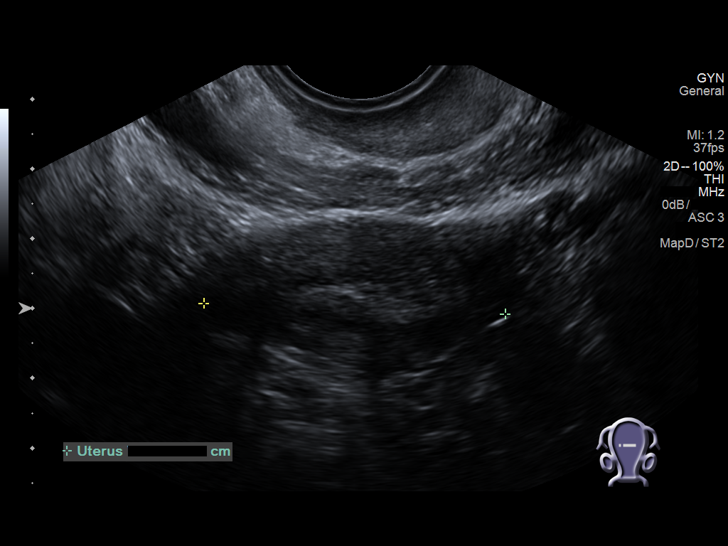
[im 22/52]
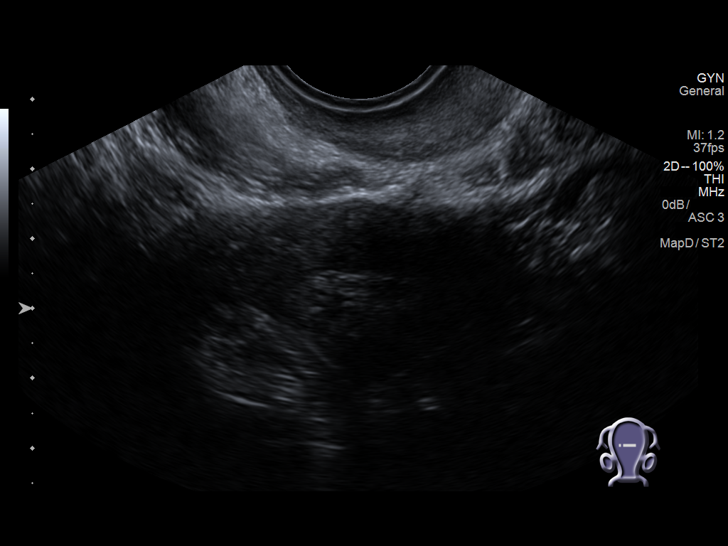
[im 26/52]
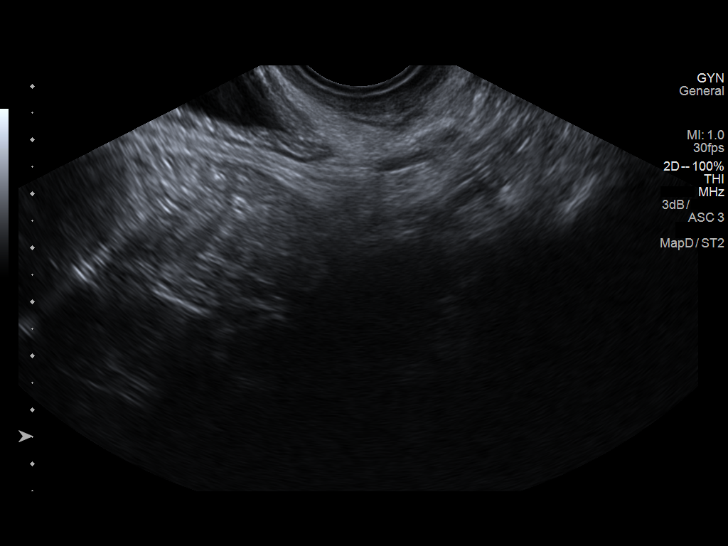
[im 30/52]
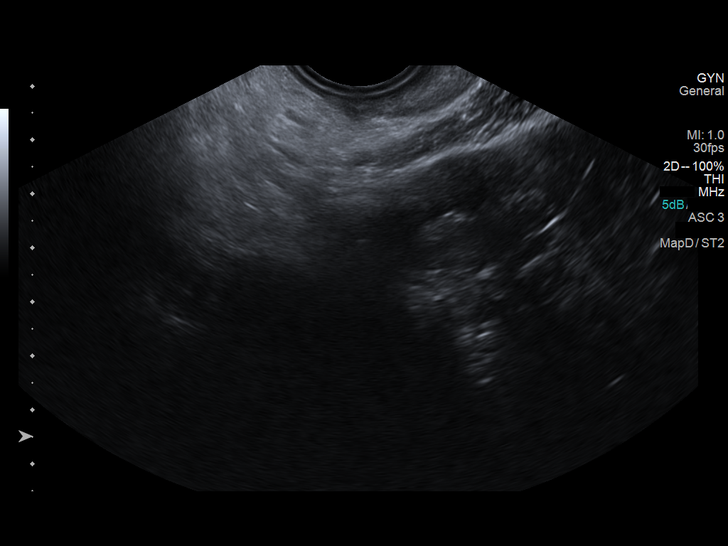
[im 35/52]
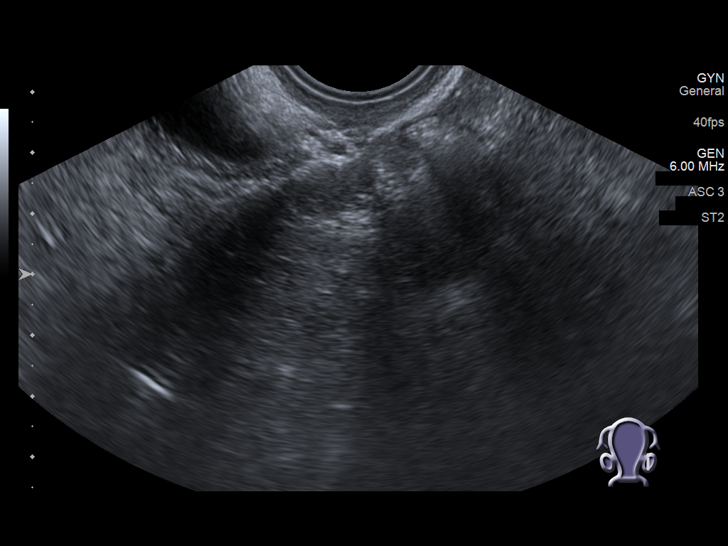
[im 39/52]
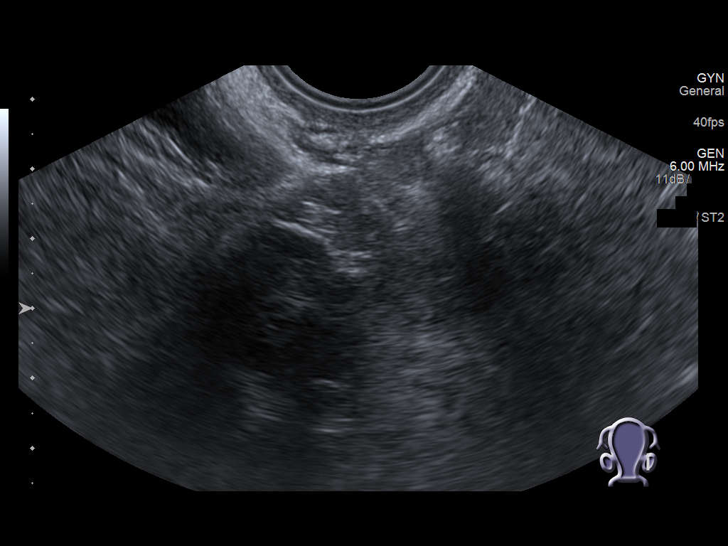
[im 43/52]
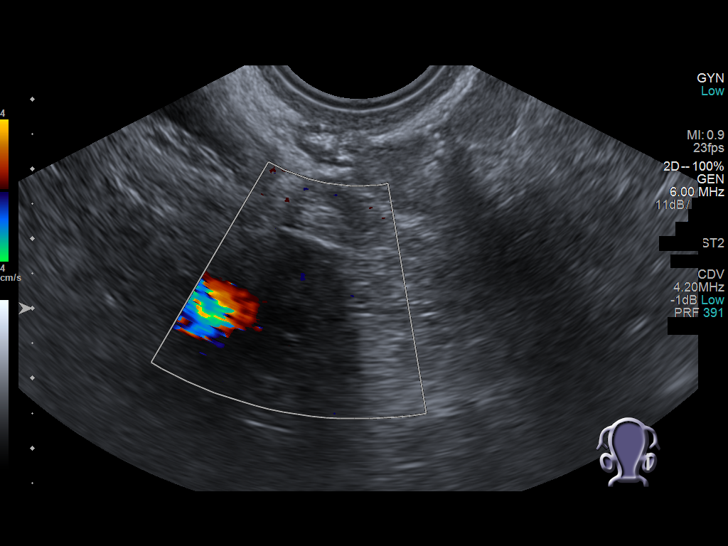
[im 47/52]
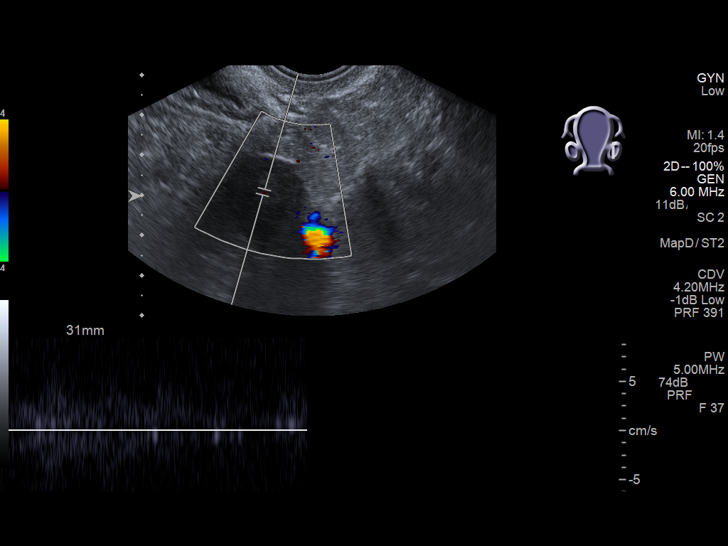
[im 52/52]
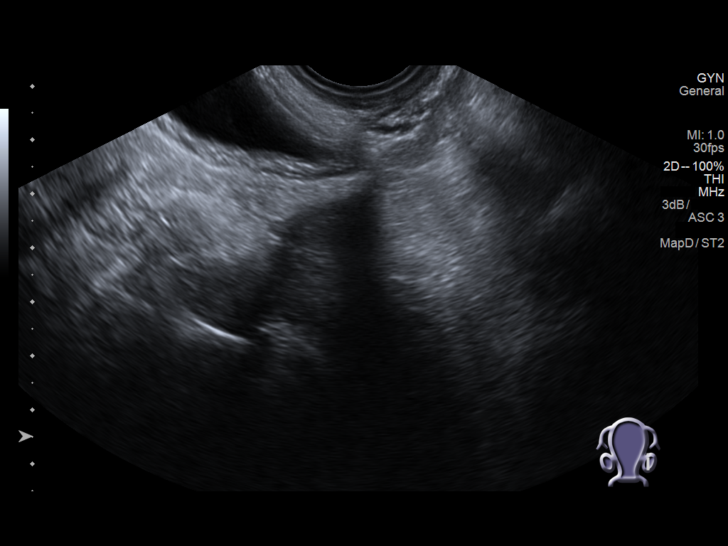

[13 of 25 positions shown; findings below may reference images not displayed]

FINDINGS: Uterus

Measurements: 5.7 x 2.8 x 4.3 cm. No fibroids or other mass
visualized. A nabothian cyst is seen at the cervix.

Endometrium

Thickness: 0.7 cm.  No focal abnormality visualized.

Right ovary

Not visualized.

Left ovary

Measurements: 2.6 x 1.8 x 2.1 cm. Normal appearance/no adnexal mass.

Pulsed Doppler evaluation of the left ovary demonstrates normal
low-resistance arterial and venous waveforms.

Other findings

No abnormal free fluid.
IMPRESSION: 1. No evidence for ovarian torsion on the left side. The right ovary
is not visualized.
2. Uterus unremarkable in appearance.

## 2017-08-20 ENCOUNTER — Other Ambulatory Visit: Payer: Self-pay

## 2017-08-20 ENCOUNTER — Emergency Department (HOSPITAL_COMMUNITY)
Admission: EM | Admit: 2017-08-20 | Discharge: 2017-08-21 | Disposition: A | Discharge status: Home / Self Care | Payer: Self-pay | Attending: Emergency Medicine | Admitting: Emergency Medicine

## 2017-08-20 ENCOUNTER — Encounter (HOSPITAL_COMMUNITY): Payer: Self-pay | Admitting: Emergency Medicine

## 2017-08-20 ENCOUNTER — Emergency Department (HOSPITAL_COMMUNITY): Payer: Self-pay

## 2017-08-20 DIAGNOSIS — K6289 Other specified diseases of anus and rectum: Secondary | ICD-10-CM | POA: Insufficient documentation

## 2017-08-20 DIAGNOSIS — F17210 Nicotine dependence, cigarettes, uncomplicated: Secondary | ICD-10-CM | POA: Insufficient documentation

## 2017-08-20 DIAGNOSIS — R1084 Generalized abdominal pain: Secondary | ICD-10-CM

## 2017-08-20 DIAGNOSIS — Z86001 Personal history of in-situ neoplasm of cervix uteri: Secondary | ICD-10-CM | POA: Insufficient documentation

## 2017-08-20 DIAGNOSIS — R61 Generalized hyperhidrosis: Secondary | ICD-10-CM | POA: Insufficient documentation

## 2017-08-20 DIAGNOSIS — K5900 Constipation, unspecified: Secondary | ICD-10-CM | POA: Insufficient documentation

## 2017-08-20 LAB — COMPREHENSIVE METABOLIC PANEL
ALT: 25 U/L (ref 14–54)
AST: 28 U/L (ref 15–41)
Albumin: 4 g/dL (ref 3.5–5.0)
Alkaline Phosphatase: 65 U/L (ref 38–126)
Anion gap: 12 (ref 5–15)
BUN: 9 mg/dL (ref 6–20)
CHLORIDE: 104 mmol/L (ref 101–111)
CO2: 24 mmol/L (ref 22–32)
CREATININE: 0.55 mg/dL (ref 0.44–1.00)
Calcium: 9.5 mg/dL (ref 8.9–10.3)
GFR calc Af Amer: >60 mL/min (ref >60)
Glucose, Bld: 241 mg/dL — ABNORMAL HIGH (ref 65–99)
Potassium: 3.9 mmol/L (ref 3.5–5.1)
Sodium: 140 mmol/L (ref 135–145)
Total Bilirubin: 1.3 mg/dL — ABNORMAL HIGH (ref 0.3–1.2)
Total Protein: 8 g/dL (ref 6.5–8.1)

## 2017-08-20 LAB — CBC WITH DIFFERENTIAL/PLATELET
Basophils Absolute: 0 10*3/uL (ref 0.0–0.1)
Basophils Relative: 0 %
EOS PCT: 1 %
Eosinophils Absolute: 0.1 10*3/uL (ref 0.0–0.7)
HCT: 41.3 % (ref 36.0–46.0)
HEMOGLOBIN: 13.5 g/dL (ref 12.0–15.0)
LYMPHS ABS: 2 10*3/uL (ref 0.7–4.0)
Lymphocytes Relative: 26 %
MCH: 28.4 pg (ref 26.0–34.0)
MCHC: 32.7 g/dL (ref 30.0–36.0)
MCV: 86.9 fL (ref 78.0–100.0)
MONOS PCT: 5 %
Monocytes Absolute: 0.4 10*3/uL (ref 0.1–1.0)
NEUTROS PCT: 68 %
Neutro Abs: 5.4 10*3/uL (ref 1.7–7.7)
Platelets: 230 10*3/uL (ref 150–400)
RBC: 4.75 MIL/uL (ref 3.87–5.11)
RDW: 12.2 % (ref 11.5–15.5)
WBC: 7.9 10*3/uL (ref 4.0–10.5)

## 2017-08-20 LAB — CBG MONITORING, ED: Glucose-Capillary: 222 mg/dL — ABNORMAL HIGH (ref 65–99)

## 2017-08-20 LAB — LIPASE, BLOOD: LIPASE: 23 U/L (ref 11–51)

## 2017-08-20 MED ORDER — SODIUM CHLORIDE 0.9 % IV BOLUS (SEPSIS)
1000.0000 mL | Freq: Once | INTRAVENOUS | Status: AC
  Administered 2017-08-20: 1000 mL via INTRAVENOUS

## 2017-08-20 MED ORDER — ONDANSETRON HCL 4 MG/2ML IJ SOLN
4.0000 mg | Freq: Once | INTRAMUSCULAR | Status: AC
Start: 2017-08-20 — End: 2017-08-20
  Administered 2017-08-20: 4 mg via INTRAVENOUS
  Filled 2017-08-20: qty 2

## 2017-08-20 MED ORDER — BISACODYL 10 MG RE SUPP
10.0000 mg | Freq: Once | RECTAL | Status: AC
  Administered 2017-08-20: 10 mg via RECTAL
  Filled 2017-08-20: qty 1

## 2017-08-20 NOTE — ED Triage Notes (Signed)
Pt reports abd pain and V/ X a few days, states she has not had a BM in two weeks despite OTC meds. V/ X5 today, has not ate in 48 hours.

## 2017-08-20 NOTE — ED Provider Notes (Signed)
[facility] EMERGENCY DEPARTMENT Provider Note   CSN: [id number] Arrival date & time: 08/20/17  2221     History   Chief Complaint Chief Complaint  Patient presents with  . Abdominal Pain    HPI Nancy [name] is a [age] y.o. female with a history of chronic constipation presenting with acute constipation and severe rectal pain believing she is impacted.  Her last bowel movement was approximately 2 weeks ago.  She endorses nausea and vomiting, approximately 5 episodes of nonbloody emesis today.  She denies abdominal pain.  She has been passing flatus.  She has tried Dulcolax suppository but was unable to retain it.  She does report taking hydrocodone for musculoskeletal pain the past several weeks.  She stopped 3 days ago over concerns this was causing constipation.  She denies fevers, but presents with diaphoresis, stating this is secondary to pain.    The history is provided by the patient.    Past Medical History:  Diagnosis Date  . Anxiety   . Bipolar disorder (HCC)   . CIN I (cervical intraepithelial neoplasia I)   . Enlarged heart   . Nephrolithiasis   . OSA (obstructive sleep apnea)   . Tachycardia     Patient Active Problem List   Diagnosis Date Noted  . Bipolar affective disorder, depressed, severe (HCC) 08/27/2016  . Opiate abuse, episodic (HCC) 04/09/2015  . MDD (major depressive disorder), recurrent episode, severe (HCC) 04/09/2015  . Suicide attempt by other tranquilizer drug overdose (HCC) 04/09/2015  . 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 Dysmenorrhea 07/06/2013  . Dysmenorrhea 06/09/2013  . Menorrhagia with regular cycle 06/09/2013  . Leg edema 10/03/2011  . Edema leg 10/03/2011  . Bipolar I disorder, most recent episode depressed (HCC) 09/26/2011  . Morbid obesity (HCC) 09/26/2011  . Weakness of both legs 09/04/2011  . Difficulty in walking(719.7) 09/04/2011  . 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 BLADDER AUGMENTATION  2001   enlargement  . DILITATION & CURRETTAGE/HYSTROSCOPY WITH THERMACHOICE ABLATION N/A 07/06/2013   Procedure: DILATATION & CURETTAGE/HYSTEROSCOPY WITH THERMACHOICE ABLATION;  Surgeon: Tilda BurrowJohn V Ferguson, MD;  Location: [facility];  Service: Gynecology;  Laterality: N/A;  total therapt time- 9 minutes 2 seconds; 87C  . LAPAROSCOPIC BILATERAL SALPINGECTOMY Bilateral 07/06/2013   Procedure: LAPAROSCOPIC BILATERAL SALPINGECTOMY;  Surgeon: Tilda BurrowJohn V Ferguson, MD;  Location: [facility];  Service: Gynecology;  Laterality: Bilateral;    OB History    No data available       Home Medications    Prior to Admission medications   Medication Sig Start Date End Date Taking? Authorizing Provider  insulin glargine (LANTUS) 100 UNIT/ML injection Inject 0.1 mLs (10 Units total) into the skin at bedtime. Patient taking differently: Inject 20 Units into the skin at bedtime.  08/30/16   Adonis BrookAgustin, Sheila, NP  lamoTRIgine (LAMICTAL) 25 MG tablet Take 1 tablet (25 mg total) by mouth daily. Patient not taking: Reported on 09/24/2016 08/31/16   Adonis BrookAgustin, Sheila, NP  lurasidone (LATUDA) 40 MG TABS tablet Take 1 tablet (40 mg total) by mouth at bedtime. Patient not taking: Reported on 09/24/2016 08/30/16   Adonis BrookAgustin, Sheila, NP  nicotine (NICODERM CQ - DOSED IN MG/24 HOURS) 21 mg/24hr patch Place 1 patch (21 mg total) onto the skin daily. Patient not taking: Reported on 09/24/2016 08/31/16   Adonis BrookAgustin, Sheila, NP  ondansetron (ZOFRAN) 4 MG tablet Take 1 tablet (4 mg total) by mouth  every 8 (eight) hours as needed for nausea or vomiting. 09/25/16   Rancour, Jeannett Senior, MD  venlafaxine XR (EFFEXOR-XR) 75 MG 24 hr capsule Take 1 capsule (75 mg total) by mouth daily with breakfast. Patient not taking: Reported on 09/24/2016 08/31/16   Adonis Brook, NP  zolpidem (AMBIEN) 5 MG tablet Take 1 tablet (5 mg total) by mouth at bedtime as needed for sleep. Patient not taking: Reported on 09/24/2016  08/30/16   Adonis Brook, NP    Family History Family History  Problem Relation Age of Onset  . Hypertension Mother   . Depression Mother   . Diabetes Mother   . Cancer Mother        brain tumor  . Hypertension Father   . Diabetes Father   . Depression Father   . Hypertension Sister   . Diabetes Sister   . Depression Sister   . Depression Brother   . Diabetes Brother   . Hypertension Brother   . Cancer Maternal Aunt   . Cancer Paternal Aunt   . Hypertension Maternal Grandmother   . Diabetes Maternal Grandmother   . Depression Maternal Grandmother   . Hypertension Maternal Grandfather   . Diabetes Maternal Grandfather   . Depression Maternal Grandfather   . Hypertension Paternal Grandmother   . Diabetes Paternal Grandmother   . Depression Paternal Grandmother   . Hypertension Paternal Grandfather   . Diabetes Paternal Grandfather   . Depression Paternal Grandfather     Social History Social History   Tobacco Use  . Smoking status: Current Every Day Smoker    Packs/day: 1.50    Types: Cigarettes  . Smokeless tobacco: Never Used  Substance Use Topics  . Alcohol use: Yes    Comment: occasionally  . Drug use: No     Allergies   Asa buff (mag [buffered aspirin]; Aspirin; Tolmetin; and Prozac [fluoxetine hcl]   Review of Systems Review of Systems  Constitutional: Positive for diaphoresis. Negative for fever.  HENT: Negative for congestion and sore throat.   Eyes: Negative.   Respiratory: Negative for chest tightness and shortness of breath.   Cardiovascular: Negative for chest pain.  Gastrointestinal: Positive for constipation, nausea, rectal pain and vomiting. Negative for abdominal pain.  Genitourinary: Negative.   Musculoskeletal: Negative for arthralgias, joint swelling and neck pain.  Skin: Negative.  Negative for rash and wound.  Neurological: Negative for dizziness, weakness, light-headedness, numbness and headaches.  Psychiatric/Behavioral:  Negative.      Physical Exam Updated Vital Signs BP (!) 115/47 (BP Location: Right Arm)   Pulse 99   Temp 99.1 F (37.3 C) (Oral)   Resp 20   Ht 5\' 7"  (1.702 m)   Wt (!) 163.7 kg (361 lb)   SpO2 97%   BMI 56.54 kg/m   Physical Exam  Constitutional: She appears well-developed and well-nourished.  HENT:  Head: Normocephalic and atraumatic.  Eyes: Conjunctivae are normal.  Neck: Normal range of motion.  Cardiovascular: Normal rate, regular rhythm, normal heart sounds and intact distal pulses.  Pulmonary/Chest: Effort normal and breath sounds normal. She has no wheezes.  Abdominal: Soft. Bowel sounds are normal. There is no tenderness.  Genitourinary: Rectal exam shows no external hemorrhoid, no internal hemorrhoid and anal tone normal.  Genitourinary Comments: No rectal impaction. No stool in rectum with digital exam.  Musculoskeletal: Normal range of motion.  Neurological: She is alert.  Skin: Skin is warm and dry.  Psychiatric: She has a normal mood and affect.  Nursing note  and vitals reviewed.    ED Treatments / Results  Labs (all labs ordered are listed, but only abnormal results are displayed) Results for orders placed or performed during the hospital encounter of 08/20/17  Lipase, blood  Result Value Ref Range   Lipase 23 11 - 51 U/L  Comprehensive metabolic panel  Result Value Ref Range   Sodium 140 135 - 145 mmol/L   Potassium 3.9 3.5 - 5.1 mmol/L   Chloride 104 101 - 111 mmol/L   CO2 24 22 - 32 mmol/L   Glucose, Bld 241 (H) 65 - 99 mg/dL   BUN 9 6 - 20 mg/dL   Creatinine, Ser 1.61 0.44 - 1.00 mg/dL   Calcium 9.5 8.9 - 09.6 mg/dL   Total Protein 8.0 6.5 - 8.1 g/dL   Albumin 4.0 3.5 - 5.0 g/dL   AST 28 15 - 41 U/L   ALT 25 14 - 54 U/L   Alkaline Phosphatase 65 38 - 126 U/L   Total Bilirubin 1.3 (H) 0.3 - 1.2 mg/dL   GFR calc non Af Amer >60 >60 mL/min   GFR calc Af Amer >60 >60 mL/min   Anion gap 12 5 - 15  CBC with Differential  Result Value Ref  Range   WBC 7.9 4.0 - 10.5 K/uL   RBC 4.75 3.87 - 5.11 MIL/uL   Hemoglobin 13.5 12.0 - 15.0 g/dL   HCT 04.5 40.9 - 81.1 %   MCV 86.9 78.0 - 100.0 fL   MCH 28.4 26.0 - 34.0 pg   MCHC 32.7 30.0 - 36.0 g/dL   RDW 91.4 78.2 - 95.6 %   Platelets 230 150 - 400 K/uL   Neutrophils Relative % 68 %   Neutro Abs 5.4 1.7 - 7.7 K/uL   Lymphocytes Relative 26 %   Lymphs Abs 2.0 0.7 - 4.0 K/uL   Monocytes Relative 5 %   Monocytes Absolute 0.4 0.1 - 1.0 K/uL   Eosinophils Relative 1 %   Eosinophils Absolute 0.1 0.0 - 0.7 K/uL   Basophils Relative 0 %   Basophils Absolute 0.0 0.0 - 0.1 K/uL  POC CBG, ED  Result Value Ref Range   Glucose-Capillary 222 (H) 65 - 99 mg/dL   No results found.   EKG  EKG Interpretation None       Radiology No results found.  Procedures Procedures (including critical care time)  Medications Ordered in ED Medications  sodium chloride 0.9 % bolus 1,000 mL (1,000 mLs Intravenous New Bag/Given 08/20/17 2249)  ondansetron (ZOFRAN) injection 4 mg (4 mg Intravenous Given 08/20/17 2249)  bisacodyl (DULCOLAX) suppository 10 mg (10 mg Rectal Given 08/20/17 2337)     Initial Impression / Assessment and Plan / ED Course  I have reviewed the triage vital signs and the nursing notes.  Pertinent labs & imaging results that were available during my care of the patient were reviewed by me and considered in my medical decision making (see chart for details).     Labs reviewed and stable, no rectal impaction upon digital rectal exam.  Pt pending 2 view abd series to assess for suspected constipation and/or higher impaction/obstipation.    Discussed with Dr Judd Lien who assumes care of patient.  Final Clinical Impressions(s) / ED Diagnoses   Final diagnoses:  Rectal pain    ED Discharge Orders    None      Victoriano Lain 08/21/17 2130  Geoffery Lyons, MD 08/21/17 [phone]

## 2017-08-21 ENCOUNTER — Emergency Department (HOSPITAL_COMMUNITY): Payer: Self-pay

## 2017-08-21 LAB — HCG, QUANTITATIVE, PREGNANCY: hCG, Beta Chain, Quant, S: <1 m[IU]/mL (ref <5)

## 2017-08-21 IMAGING — DX DG ABDOMEN 2V
4 series · 4 of 4 positions shown · non-contrast
Comparison: Prior CT from [DATE].

CLINICAL DATA: Initial evaluation for acute right lower abdominal
pain with vomiting. Constipation.

EXAM:
ABDOMEN - 2 VIEW

[abdomen erect]
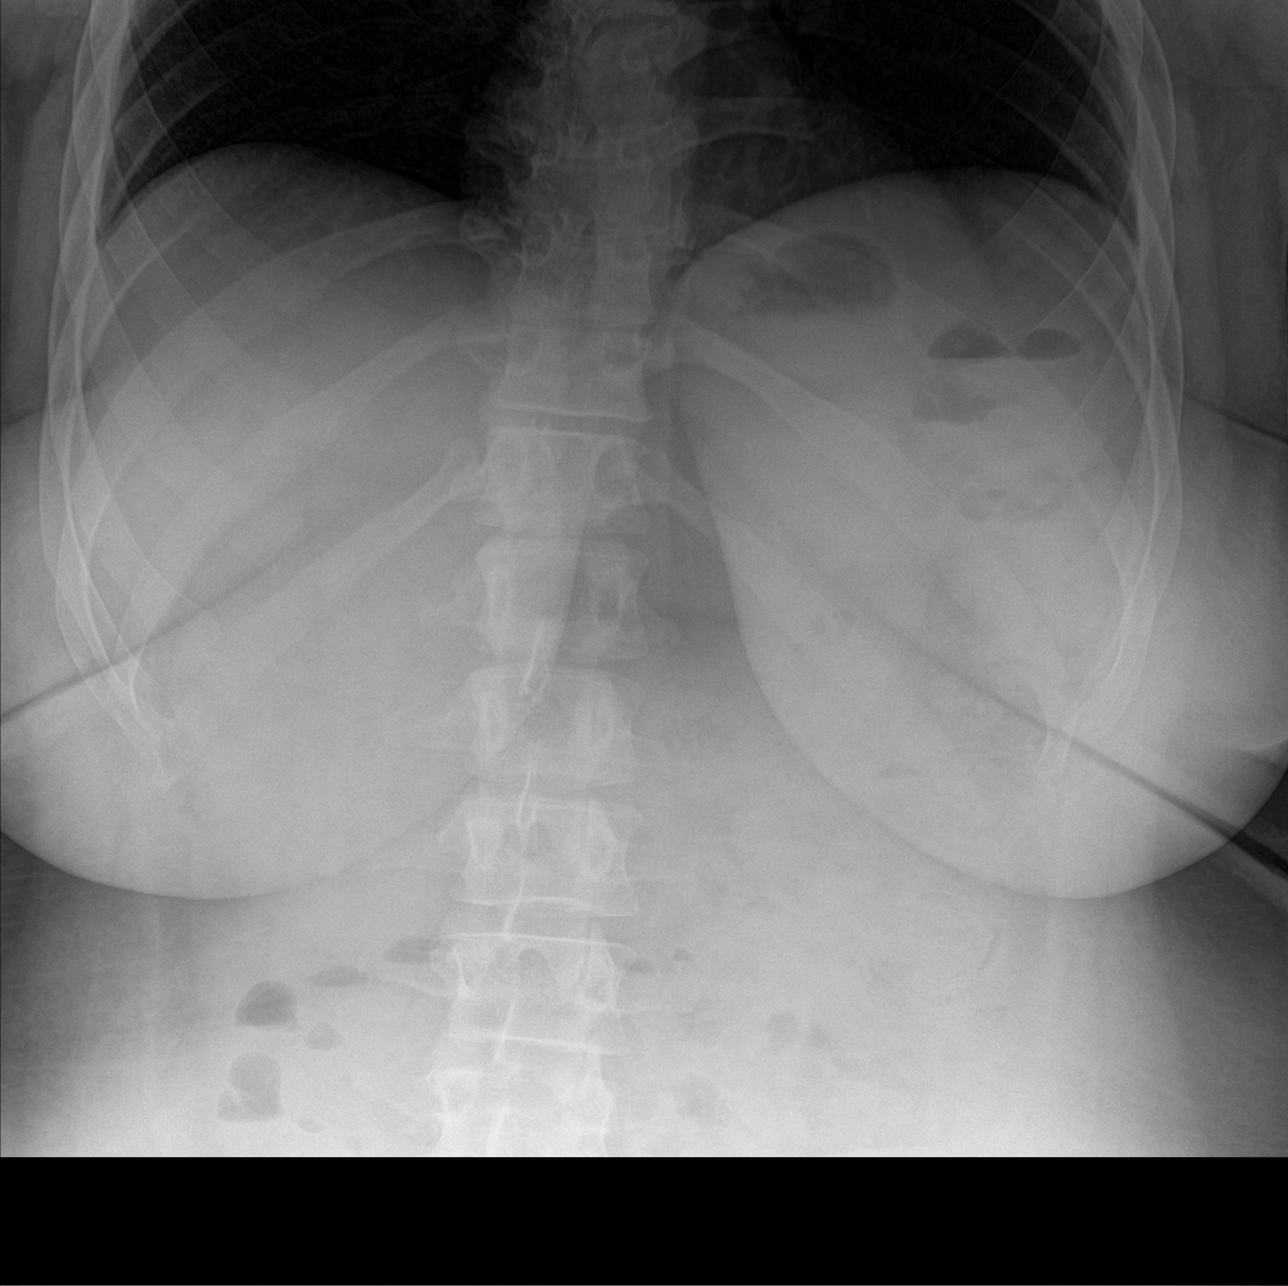

[abdomen supine (1 of 3)]
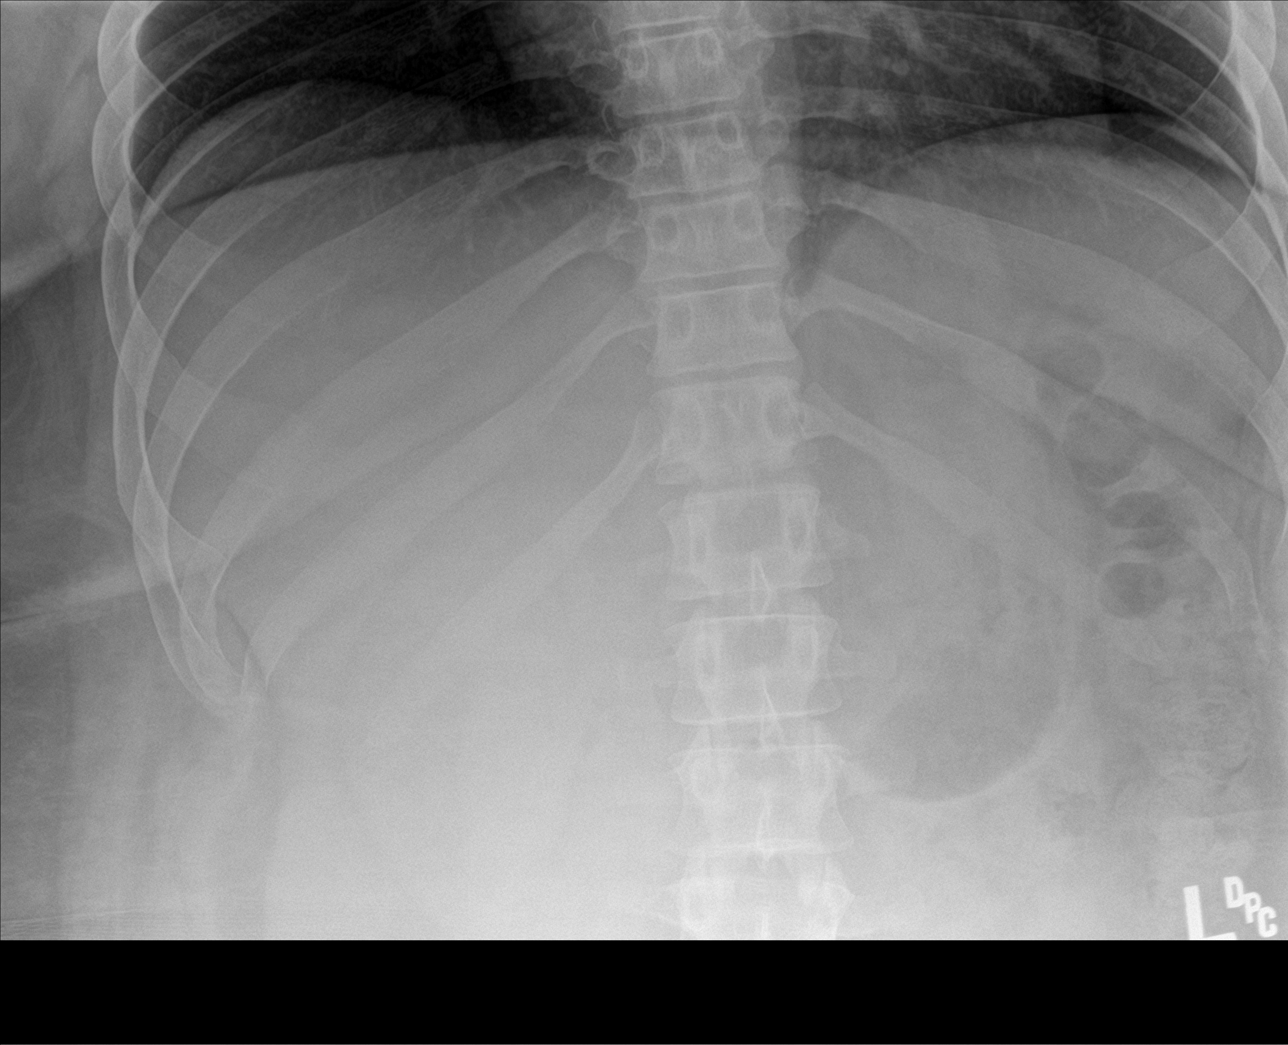

[abdomen supine (2 of 3)]
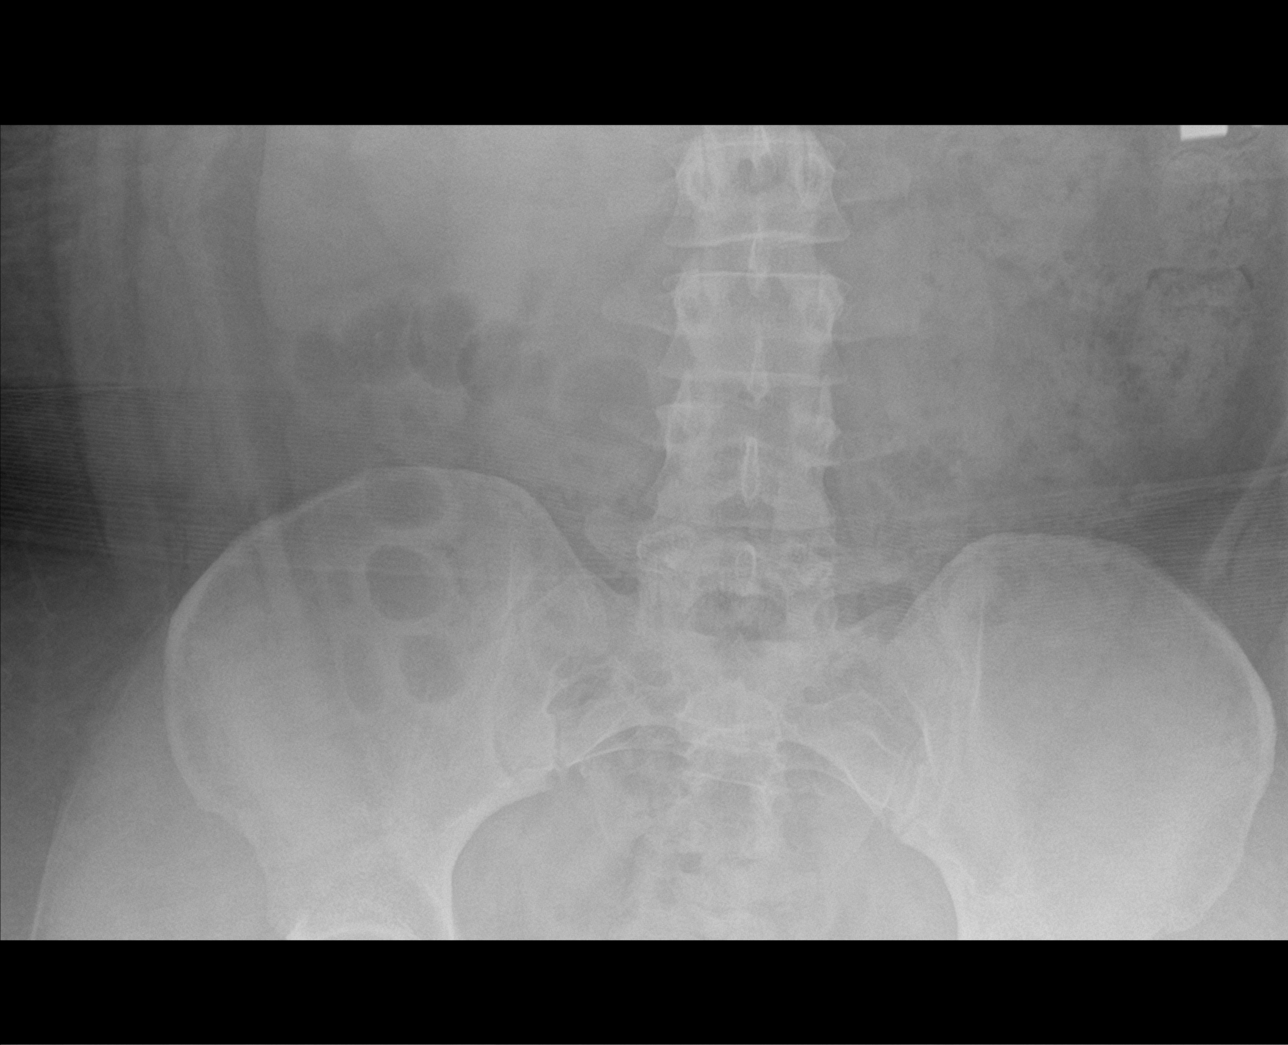

[abdomen supine (3 of 3)]
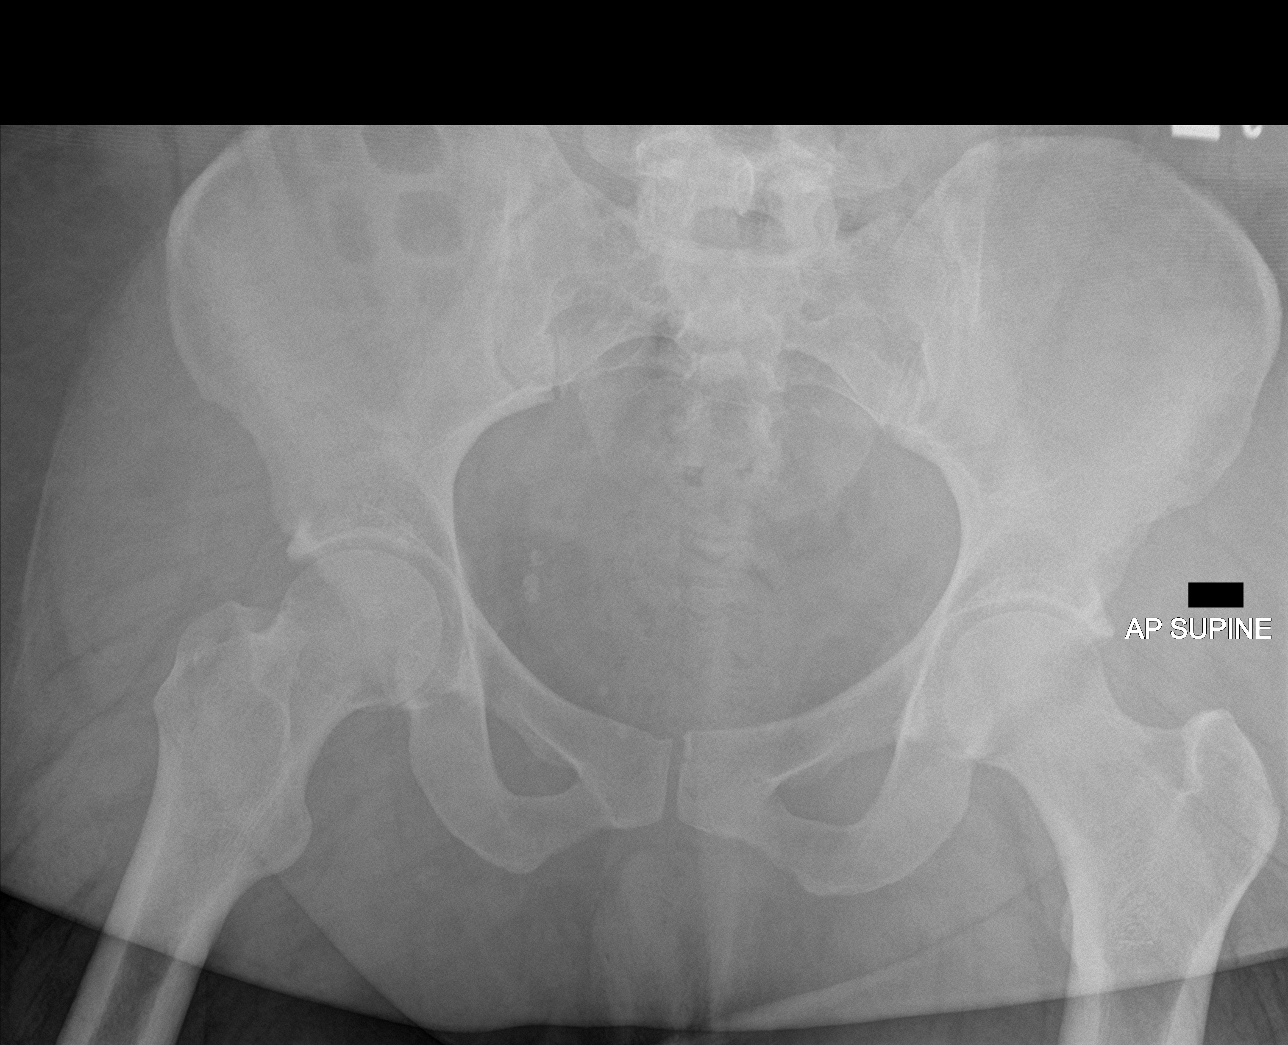

[4 of 4 positions shown; findings below may reference images not displayed]

FINDINGS: Bowel gas pattern within normal limits without obstruction or ileus.
Overall stool burden is moderate in nature. No abnormal bowel wall
thickening. No free air. No soft tissue mass or abnormal
calcification.

Visualized lung bases are clear.

Visualized osseous structures within normal limits. Mild
degenerative changes about the hips.
IMPRESSION: Nonobstructive bowel gas pattern. Overall stool burden is moderate
in nature. No other acute abnormality within the abdomen.

## 2017-08-21 IMAGING — CT CT ABD-PELV W/ CM
2 of 4 series · 16 of 46 positions shown, 18 images · IV contrast (iopamidol)
Comparison: [DATE]

CLINICAL DATA: Chronic constipation. Now with acute constipation
and severe rectal pain. Last bowel movement 2 weeks ago. Nausea and
vomiting.

EXAM:
CT ABDOMEN AND PELVIS WITH CONTRAST
TECHNIQUE: Multidetector CT imaging of the abdomen and pelvis was performed
using the standard protocol following bolus administration of
intravenous contrast.
CONTRAST:  100mL [NP] IOPAMIDOL ([NP]) INJECTION 61%

[Series 2: axial st · axial · 0.90mm/px · z∈[+976,+1441]mm · 13 of 103 slices shown, 15 images]
[im 5/103  soft-tissue]
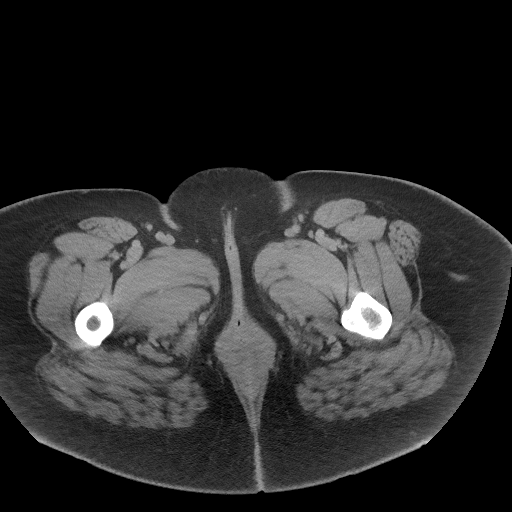
[im 5/103  bone]
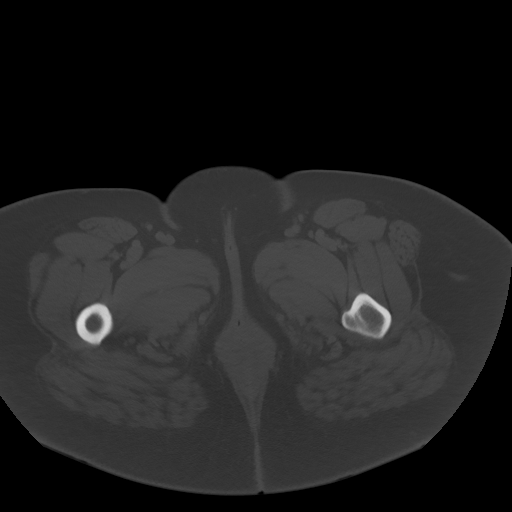
[im 13/103  soft-tissue]
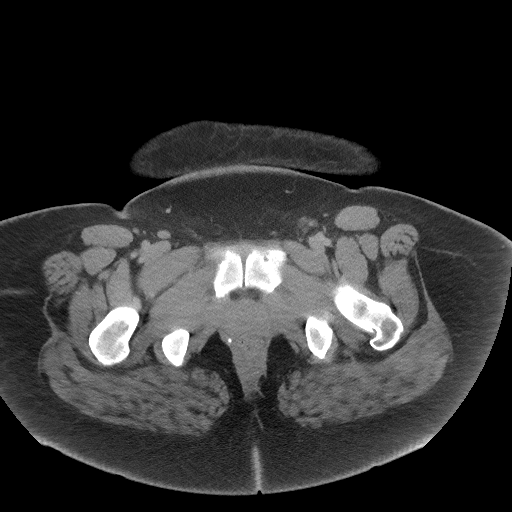
[im 22/103  soft-tissue]
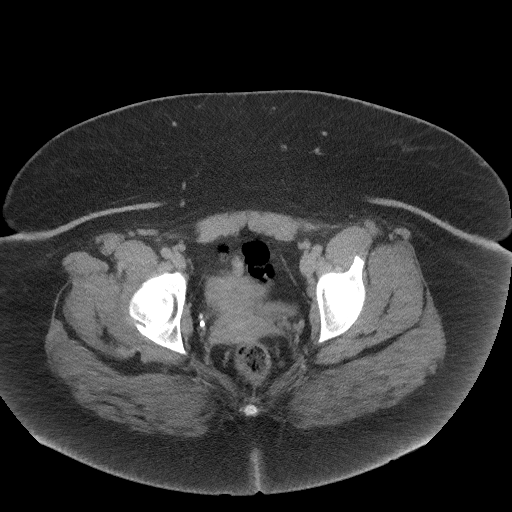
[im 30/103  soft-tissue]
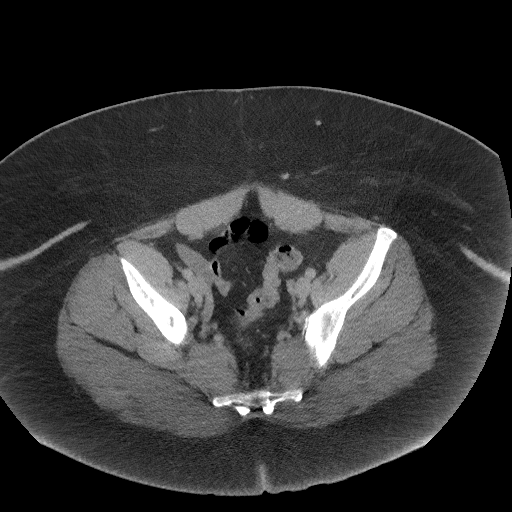
[im 35/103  soft-tissue]
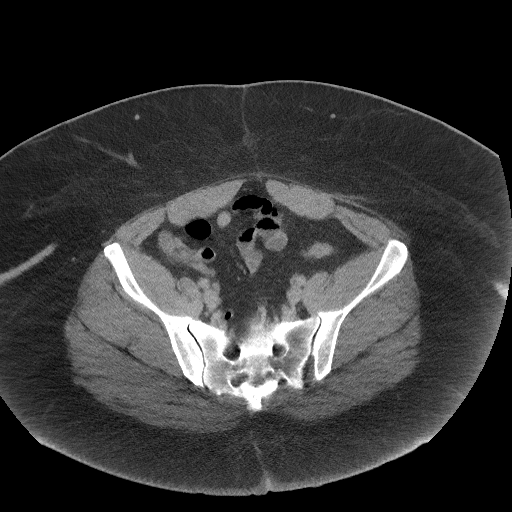
[im 43/103  soft-tissue]
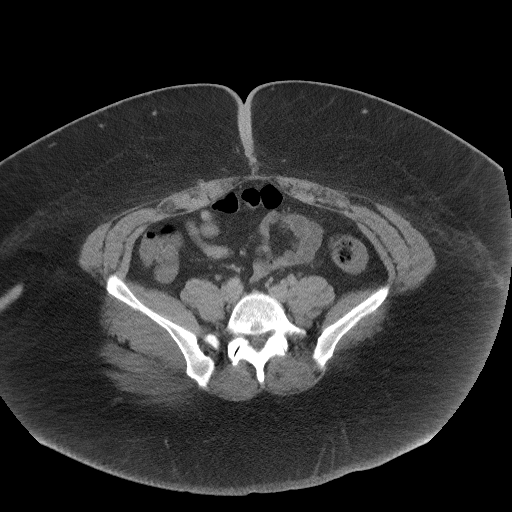
[im 52/103  soft-tissue]
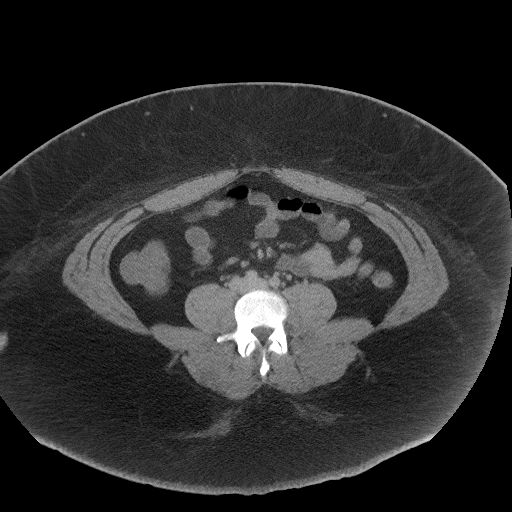
[im 60/103  soft-tissue]
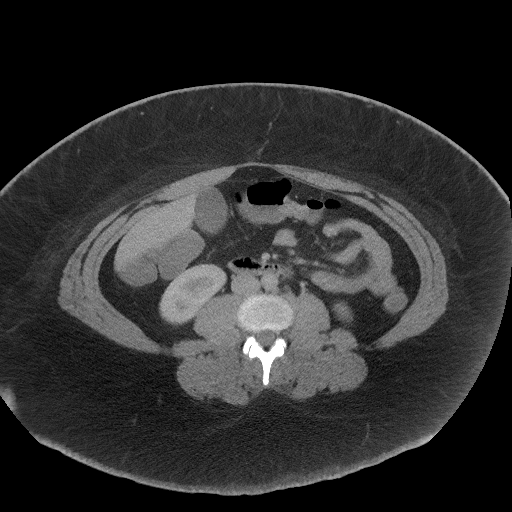
[im 69/103  soft-tissue]
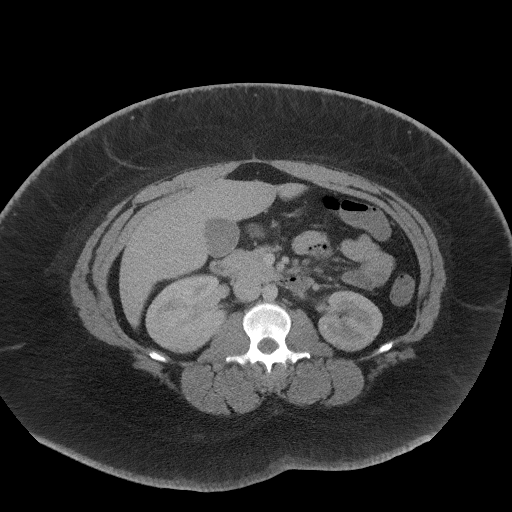
[im 69/103  bone]
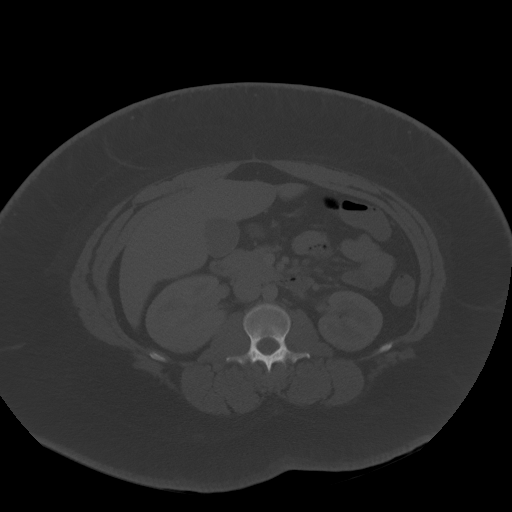
[im 73/103  soft-tissue]
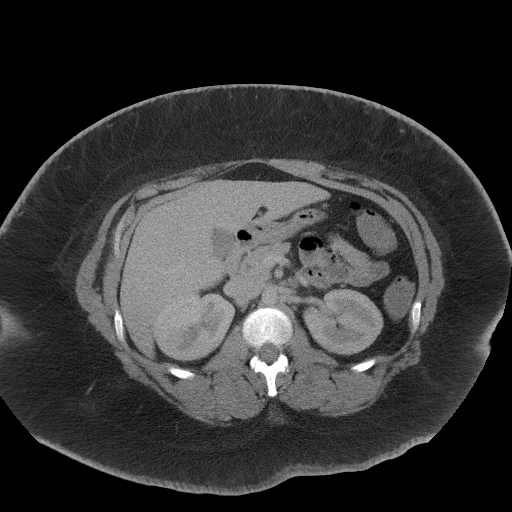
[im 81/103  soft-tissue]
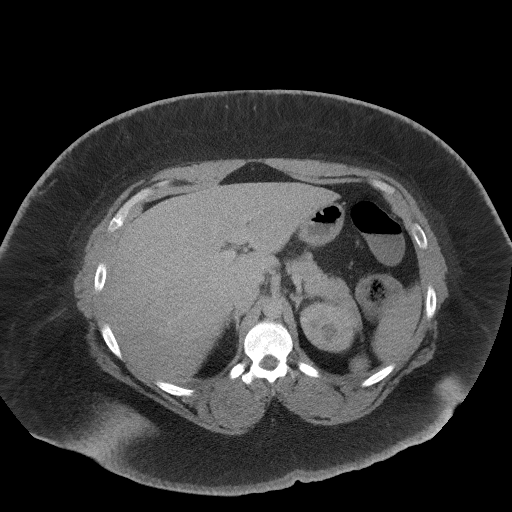
[im 90/103  soft-tissue]
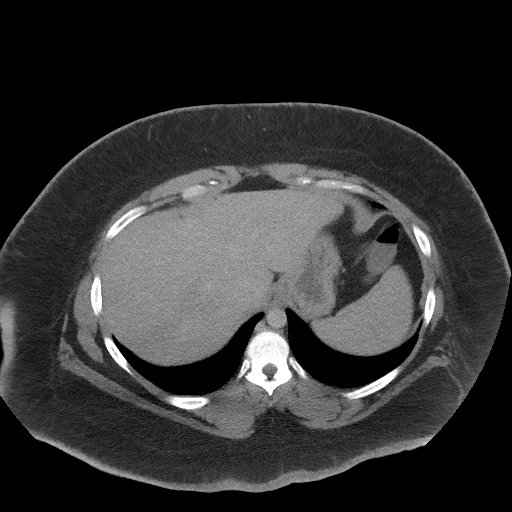
[im 98/103  soft-tissue]
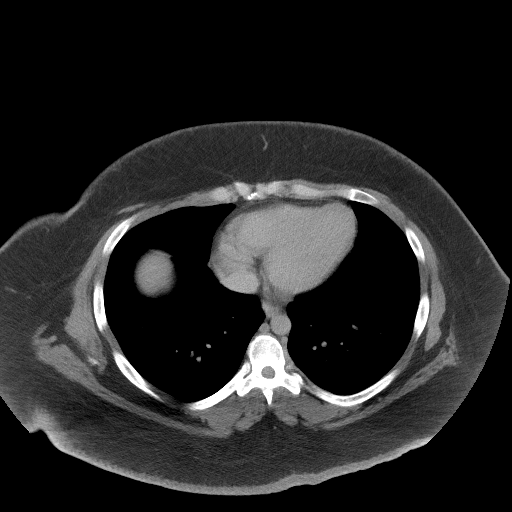

[Series 5: coronal st · coronal · 0.89mm/px · 3 of 117 slices shown]
[im 39/117  soft-tissue]
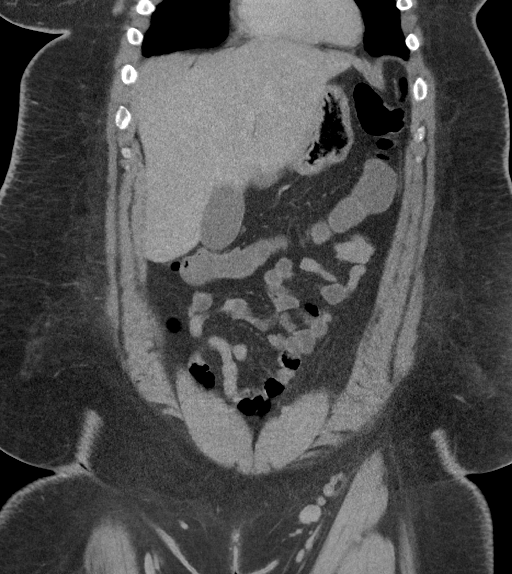
[im 52/117  soft-tissue]
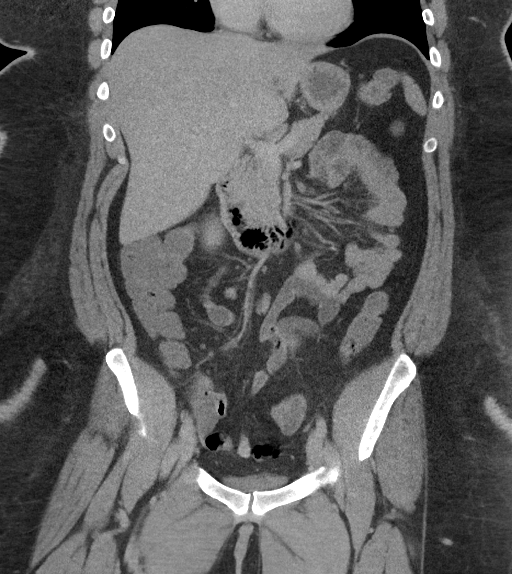
[im 65/117  soft-tissue]
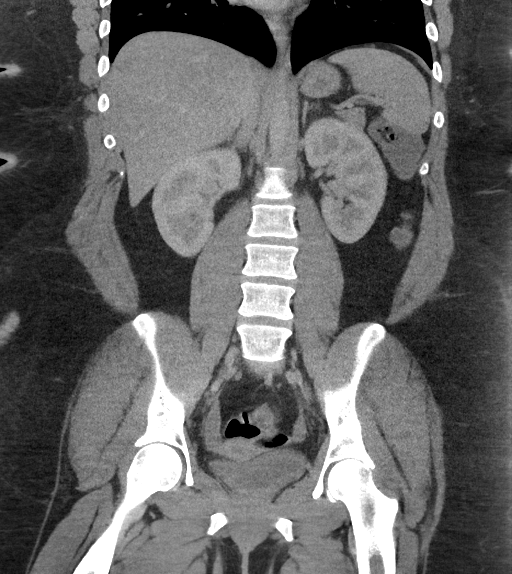

[16 of 46 positions shown; findings below may reference images not displayed]

FINDINGS: Lower chest: Lung bases are clear.

Hepatobiliary: Mild diffuse fatty infiltration of the liver. No
focal lesions. Gallbladder and bile ducts are unremarkable.

Pancreas: Unremarkable. No pancreatic ductal dilatation or
surrounding inflammatory changes.

Spleen: Normal in size without focal abnormality.

Adrenals/Urinary Tract: Adrenal glands are unremarkable. Kidneys are
normal, without renal calculi, focal lesion, or hydronephrosis.
Bladder is decompressed.

Stomach/Bowel: Scattered stool and fluid throughout the colon. No
colonic distention. No wall thickening is appreciated. Appendix is
not identified. Stomach and small bowel are mostly decompressed.

Vascular/Lymphatic: No significant vascular findings are present. No
enlarged abdominal or pelvic lymph nodes.

Reproductive: Status post hysterectomy. No adnexal masses.

Other: No abdominal wall hernia or abnormality. No abdominopelvic
ascites.

Musculoskeletal: No acute or significant osseous findings.
IMPRESSION: 1. No acute process demonstrated in the abdomen or pelvis. No
evidence of bowel obstruction or inflammation.
2. Stool and fluid throughout the colon without distention.
3. Mild diffuse fatty infiltration of the liver.

## 2017-08-21 MED ORDER — PROMETHAZINE HCL 25 MG/ML IJ SOLN
12.5000 mg | Freq: Once | INTRAMUSCULAR | Status: AC
  Administered 2017-08-21: 12.5 mg via INTRAVENOUS
  Filled 2017-08-21: qty 1

## 2017-08-21 MED ORDER — PEG 3350-KCL-NABCB-NACL-NASULF 236 G PO SOLR: 4000.0000 mL | Freq: Once | ORAL | 0 refills | Status: AC

## 2017-08-21 MED ORDER — ONDANSETRON HCL 4 MG/2ML IJ SOLN
4.0000 mg | Freq: Once | INTRAMUSCULAR | Status: AC
  Administered 2017-08-21: 4 mg via INTRAVENOUS
  Filled 2017-08-21: qty 2

## 2017-08-21 MED ORDER — MORPHINE SULFATE (PF) 4 MG/ML IV SOLN
4.0000 mg | Freq: Once | INTRAVENOUS | Status: AC
  Administered 2017-08-21: 4 mg via INTRAVENOUS
  Filled 2017-08-21: qty 1

## 2017-08-21 MED ORDER — SORBITOL 70 % SOLN
960.0000 mL | TOPICAL_OIL | Freq: Once | ORAL | Status: AC
  Administered 2017-08-21: 960 mL via RECTAL
  Filled 2017-08-21: qty 473

## 2017-08-21 MED ORDER — IOPAMIDOL (ISOVUE-300) INJECTION 61%
100.0000 mL | Freq: Once | INTRAVENOUS | Status: AC | PRN
  Administered 2017-08-21: 100 mL via INTRAVENOUS

## 2017-08-21 NOTE — Discharge Instructions (Signed)
GoLYTELY as prescribed.  Return to the emergency department for worsening pain, high fever, bloody stool, or other new and concerning symptoms.

## 2018-02-28 ENCOUNTER — Other Ambulatory Visit: Payer: Self-pay

## 2018-02-28 ENCOUNTER — Emergency Department (HOSPITAL_COMMUNITY)
Admission: EM | Admit: 2018-02-28 | Discharge: 2018-02-28 | Disposition: A | Discharge status: Home / Self Care | Attending: Emergency Medicine | Admitting: Emergency Medicine

## 2018-02-28 ENCOUNTER — Encounter (HOSPITAL_COMMUNITY): Payer: Self-pay | Admitting: Emergency Medicine

## 2018-02-28 DIAGNOSIS — I509 Heart failure, unspecified: Secondary | ICD-10-CM | POA: Diagnosis not present

## 2018-02-28 DIAGNOSIS — R52 Pain, unspecified: Secondary | ICD-10-CM

## 2018-02-28 DIAGNOSIS — Z79899 Other long term (current) drug therapy: Secondary | ICD-10-CM | POA: Insufficient documentation

## 2018-02-28 DIAGNOSIS — E119 Type 2 diabetes mellitus without complications: Secondary | ICD-10-CM | POA: Insufficient documentation

## 2018-02-28 DIAGNOSIS — Z794 Long term (current) use of insulin: Secondary | ICD-10-CM | POA: Diagnosis not present

## 2018-02-28 DIAGNOSIS — R2243 Localized swelling, mass and lump, lower limb, bilateral: Secondary | ICD-10-CM | POA: Diagnosis not present

## 2018-02-28 DIAGNOSIS — F17210 Nicotine dependence, cigarettes, uncomplicated: Secondary | ICD-10-CM | POA: Diagnosis not present

## 2018-02-28 DIAGNOSIS — R600 Localized edema: Secondary | ICD-10-CM

## 2018-02-28 HISTORY — DX: Heart failure, unspecified: I50.9

## 2018-02-28 HISTORY — DX: Type 2 diabetes mellitus without complications: E11.9

## 2018-02-28 LAB — COMPREHENSIVE METABOLIC PANEL
ALBUMIN: 3.6 g/dL (ref 3.5–5.0)
ALT: 21 U/L (ref 0–44)
AST: 20 U/L (ref 15–41)
Alkaline Phosphatase: 78 U/L (ref 38–126)
Anion gap: 7 (ref 5–15)
BILIRUBIN TOTAL: 0.5 mg/dL (ref 0.3–1.2)
BUN: 8 mg/dL (ref 6–20)
CO2: 26 mmol/L (ref 22–32)
Calcium: 8.7 mg/dL — ABNORMAL LOW (ref 8.9–10.3)
Chloride: 102 mmol/L (ref 98–111)
Creatinine, Ser: 0.5 mg/dL (ref 0.44–1.00)
GFR calc Af Amer: >60 mL/min (ref >60)
GFR calc non Af Amer: >60 mL/min (ref >60)
GLUCOSE: 204 mg/dL — AB (ref 70–99)
POTASSIUM: 3.7 mmol/L (ref 3.5–5.1)
SODIUM: 135 mmol/L (ref 135–145)
TOTAL PROTEIN: 7.2 g/dL (ref 6.5–8.1)

## 2018-02-28 LAB — CBC
HEMATOCRIT: 37.9 % (ref 36.0–46.0)
Hemoglobin: 12.9 g/dL (ref 12.0–15.0)
MCH: 29.2 pg (ref 26.0–34.0)
MCHC: 34 g/dL (ref 30.0–36.0)
MCV: 85.7 fL (ref 78.0–100.0)
Platelets: 220 10*3/uL (ref 150–400)
RBC: 4.42 MIL/uL (ref 3.87–5.11)
RDW: 12.3 % (ref 11.5–15.5)
WBC: 5.9 10*3/uL (ref 4.0–10.5)

## 2018-02-28 LAB — BRAIN NATRIURETIC PEPTIDE: B Natriuretic Peptide: 24 pg/mL (ref 0.0–100.0)

## 2018-02-28 MED ORDER — ENOXAPARIN SODIUM 300 MG/3ML IJ SOLN
1.0000 mg/kg | Freq: Once | INTRAMUSCULAR | Status: AC
  Administered 2018-02-28: 160 mg via SUBCUTANEOUS
  Filled 2018-02-28: qty 1.6

## 2018-02-28 MED ORDER — ACETAMINOPHEN 500 MG PO TABS
1000.0000 mg | ORAL_TABLET | Freq: Once | ORAL | Status: AC
  Administered 2018-02-28: 1000 mg via ORAL
  Filled 2018-02-28: qty 2

## 2018-02-28 NOTE — ED Provider Notes (Signed)
[facility] EMERGENCY DEPARTMENT Provider Note   CSN: [id number] Arrival date & time: 02/28/18  1247     History   Chief Complaint Chief Complaint  Patient presents with  . Leg Swelling    HPI Nancy [name] is a 32 y.o. female.  Patient presenting with lower extremity edema beginning today at 8:30 AM.  Patient describes edema bilaterally but left worse than right lower extremity.  Patient says she normally retains fluid but legs are significantly more swollen today.  Says that her left lower extremity is very painful, rates as 6 out of 10 pain.  Patient says leg is constantly throbbing and after palpation become sharp pain.  Denies any trauma to the area.  Patient leads a very sedentary lifestyle so has not had increased physical activity.  Patient does report falling twice a few weeks ago when she lost balance.  Patient has a history of right leg DVT in 2013, patient not on any anticoagulation.  Patient also has a history of congestive heart failure she said she was diagnosed in 2012 but is not on any medications. Care received in [place], [place] so no records available. Patient says the only medication she takes his Lantus and metformin for her diabetes.  Patient denies any shortness of breath or chest pain.     Past Medical History:  Diagnosis Date  . Anxiety   . Bipolar disorder (HCC)   . CHF (congestive heart failure) (HCC)   . CIN I (cervical intraepithelial neoplasia I)   . Diabetes mellitus without complication (HCC)   . Enlarged heart   . Nephrolithiasis   . OSA (obstructive sleep apnea)   . Tachycardia     Patient Active Problem List   Diagnosis Date Noted  . Bipolar affective disorder, depressed, severe (HCC) 08/27/2016  . Opiate abuse, episodic (HCC) 04/09/2015  . MDD (major depressive disorder), recurrent episode, severe (HCC) 04/09/2015  . Suicide attempt by other tranquilizer drug overdose (HCC) 04/09/2015  . 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 Dysmenorrhea 07/06/2013  . Dysmenorrhea 06/09/2013  . Menorrhagia with regular cycle 06/09/2013  . Leg edema 10/03/2011  . Edema leg 10/03/2011  . Bipolar I disorder, most recent episode depressed (HCC) 09/26/2011  . Morbid obesity (HCC) 09/26/2011  . Weakness of both legs 09/04/2011  . Difficulty in walking(719.7) 09/04/2011  . 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 BLADDER AUGMENTATION  2001   enlargement  . DILITATION & CURRETTAGE/HYSTROSCOPY WITH THERMACHOICE ABLATION N/A 07/06/2013   Procedure: DILATATION & CURETTAGE/HYSTEROSCOPY WITH THERMACHOICE ABLATION;  Surgeon: Tilda Burrow, MD;  Location: [facility];  Service: Gynecology;  Laterality: N/A;  total therapt time- 9 minutes 2 seconds; 87C  . LAPAROSCOPIC BILATERAL SALPINGECTOMY Bilateral 07/06/2013   Procedure: LAPAROSCOPIC BILATERAL SALPINGECTOMY;  Surgeon: Tilda Burrow, MD;  Location: [facility];  Service: Gynecology;  Laterality: Bilateral;     OB History   None      Home Medications    Prior to Admission medications   Medication Sig Start Date End Date Taking? Authorizing Provider  EPINEPHrine 0.3 mg/0.3 mL IJ SOAJ injection Inject 0.3 mg into the muscle once.   Yes [provider]  insulin glargine (LANTUS) 100 UNIT/ML injection Inject 0.1 mLs (10 Units total) into the skin at bedtime. Patient taking differently: Inject 20 Units into the skin at bedtime.  08/30/16  Yes Adonis Brook, NP  metFORMIN (GLUCOPHAGE) 500 MG tablet Take  500 mg by mouth 2 (two) times daily with a meal.   Yes [provider]    Family History Family History  Problem Relation Age of Onset  . Hypertension Mother   . Depression Mother   . Diabetes Mother   . Cancer Mother        brain tumor  . Hypertension Father   . Diabetes Father   . Depression Father   . Hypertension Sister   . Diabetes Sister   . Depression Sister   . Depression Brother   .  Diabetes Brother   . Hypertension Brother   . Cancer Maternal Aunt   . Cancer Paternal Aunt   . Hypertension Maternal Grandmother   . Diabetes Maternal Grandmother   . Depression Maternal Grandmother   . Hypertension Maternal Grandfather   . Diabetes Maternal Grandfather   . Depression Maternal Grandfather   . Hypertension Paternal Grandmother   . Diabetes Paternal Grandmother   . Depression Paternal Grandmother   . Hypertension Paternal Grandfather   . Diabetes Paternal Grandfather   . Depression Paternal Grandfather     Social History Social History   Tobacco Use  . Smoking status: Current Every Day Smoker    Packs/day: 1.50    Types: Cigarettes  . Smokeless tobacco: Never Used  Substance Use Topics  . Alcohol use: Yes    Comment: occasionally  . Drug use: No     Allergies   Asa buff (mag [buffered aspirin]; Aspirin; Tolmetin; and Prozac [fluoxetine hcl]   Review of Systems Review of Systems  Constitutional: Negative for activity change.  Respiratory: Negative for shortness of breath.   Cardiovascular: Positive for leg swelling. Negative for chest pain.     Physical Exam Updated Vital Signs BP (!) 137/108   Pulse (!) 103   Temp 98.3 F (36.8 C) (Oral)   Resp 19   Ht 5\' 7"  (1.702 m)   Wt (!) 159.7 kg (352 lb)   SpO2 99%   BMI 55.13 kg/m   Physical Exam  Constitutional: No distress.  HENT:  Head: Normocephalic.  Mouth/Throat: Oropharynx is clear and moist.  Eyes: Pupils are equal, round, and reactive to light. Conjunctivae and EOM are normal. Right eye exhibits no discharge. Left eye exhibits no discharge.  Neck: Neck supple.  Cardiovascular: Normal rate, regular rhythm and normal heart sounds.  Difficult exam 2/2 body habitus  Pulmonary/Chest: Effort normal and breath sounds normal. No respiratory distress. She has no wheezes. She has no rales.  Difficult exam 2/2 body habitus  Abdominal: Soft. Bowel sounds are normal. She exhibits no mass. There  is no tenderness.  Musculoskeletal: She exhibits edema.  Trace pitting edema is lower extremities bilaterally, L>R. Tenderness to palpation of left calf. Pain with Homans exam of left side however describes pain as throughout LE. Full ankle ROM. Left lower extremity slightly warm  Neurological: She is alert.  Skin: Skin is warm. No rash noted.  Psychiatric: She has a normal mood and affect.     ED Treatments / Results  Labs (all labs ordered are listed, but only abnormal results are displayed) Labs Reviewed  COMPREHENSIVE METABOLIC PANEL - Abnormal; Notable for the following components:      Result Value   Glucose, Bld 204 (*)    Calcium 8.7 (*)    All other components within normal limits  CBC  BRAIN NATRIURETIC PEPTIDE    EKG None  Radiology No results found.  Procedures Procedures (including critical care time)  Medications Ordered in ED Medications  enoxaparin (LOVENOX) injection 160 mg (has no administration in time range)  acetaminophen (TYLENOL) tablet 1,000 mg (1,000 mg Oral Given 02/28/18 1405)     Initial Impression / Assessment and Plan / ED Course  I have reviewed the triage vital signs and the nursing notes.  Pertinent labs & imaging results that were available during my care of the patient were reviewed by me and considered in my medical decision making (see chart for details).   Patient presenting today with bilateral lower extremity edema, left greater than right.  Given sedentary lifestyle as well as history of DVT not on any anticoagulation will obtain venous Dopplers to rule out DVT.  Patient also with past medical history of congestive heart failure.  Unable to review records as patient was diagnosed in [place].  Unclear what patient's ejection fraction is.  Will obtain BNP to rule out CHF exacerbation.  Will obtain CMP to rule out liver abnormalities albumin abnormalities that could lead to edema.  2:54PM: Unable to obtain doppler as no US  available after 12PM. Will need to schedule outpatient doppler studies. CMP showing elevated glucose to 204 and decreased Ca to 8.7, however LFTs and albumin wnl. CBC unremarkable. BNP wnl to 24.0. Given that there is a high pretest probability for DVT will give one time dose of 1mg /kg lovenox (confirmed dose on UpToDate) and plan to discharge with outpatient dopplers.   Discussed plan with Dr. Jodi MourningZavitz who independently examined patient and agreed with plan   Final Clinical Impressions(s) / ED Diagnoses   Final diagnoses:  Leg edema    ED Discharge Orders        Ordered    VAS US LOWER EXTREMITY VENOUS (DVT)     02/28/18 1459       Oralia ManisAbraham, Norton Bivins, DO 02/28/18 1502    Blane OharaZavitz, Joshua, MD 02/28/18 1544

## 2018-02-28 NOTE — ED Triage Notes (Signed)
Edema and pain to LLE since this morning.  Denies injury

## 2018-02-28 NOTE — Discharge Instructions (Signed)
Please come back tomorrow morning when you wake up to have your lower extremity dopplers.  If you feel short of breath come to the emergency room immediately.

## 2018-03-01 ENCOUNTER — Ambulatory Visit (HOSPITAL_COMMUNITY): Admission: RE | Admit: 2018-03-01 | Source: Ambulatory Visit

## 2018-03-01 ENCOUNTER — Inpatient Hospital Stay (HOSPITAL_COMMUNITY): Admission: RE | Admit: 2018-03-01 | Payer: Self-pay | Source: Ambulatory Visit

## 2018-03-01 ENCOUNTER — Other Ambulatory Visit (HOSPITAL_COMMUNITY): Payer: Self-pay

## 2018-03-01 ENCOUNTER — Other Ambulatory Visit: Payer: Self-pay | Admitting: Family Medicine

## 2018-03-01 DIAGNOSIS — R600 Localized edema: Secondary | ICD-10-CM

## 2018-09-04 ENCOUNTER — Emergency Department (HOSPITAL_COMMUNITY)
Admission: EM | Admit: 2018-09-04 | Discharge: 2018-09-04 | Disposition: A | Discharge status: Home / Self Care | Attending: Emergency Medicine | Admitting: Emergency Medicine

## 2018-09-04 ENCOUNTER — Emergency Department (HOSPITAL_COMMUNITY)
Admission: EM | Admit: 2018-09-04 | Discharge: 2018-09-05 | Disposition: A | Discharge status: Home / Self Care | Source: Home / Self Care | Attending: Emergency Medicine | Admitting: Emergency Medicine

## 2018-09-04 ENCOUNTER — Emergency Department (HOSPITAL_COMMUNITY)

## 2018-09-04 ENCOUNTER — Encounter (HOSPITAL_COMMUNITY): Payer: Self-pay | Admitting: Emergency Medicine

## 2018-09-04 ENCOUNTER — Other Ambulatory Visit: Payer: Self-pay

## 2018-09-04 DIAGNOSIS — R739 Hyperglycemia, unspecified: Secondary | ICD-10-CM | POA: Diagnosis present

## 2018-09-04 DIAGNOSIS — Z79899 Other long term (current) drug therapy: Secondary | ICD-10-CM | POA: Insufficient documentation

## 2018-09-04 DIAGNOSIS — I509 Heart failure, unspecified: Secondary | ICD-10-CM | POA: Insufficient documentation

## 2018-09-04 DIAGNOSIS — R1011 Right upper quadrant pain: Secondary | ICD-10-CM | POA: Insufficient documentation

## 2018-09-04 DIAGNOSIS — F419 Anxiety disorder, unspecified: Secondary | ICD-10-CM | POA: Diagnosis not present

## 2018-09-04 DIAGNOSIS — F319 Bipolar disorder, unspecified: Secondary | ICD-10-CM | POA: Diagnosis not present

## 2018-09-04 DIAGNOSIS — I110 Hypertensive heart disease with heart failure: Secondary | ICD-10-CM | POA: Diagnosis not present

## 2018-09-04 DIAGNOSIS — Z794 Long term (current) use of insulin: Secondary | ICD-10-CM | POA: Diagnosis not present

## 2018-09-04 DIAGNOSIS — F17210 Nicotine dependence, cigarettes, uncomplicated: Secondary | ICD-10-CM | POA: Insufficient documentation

## 2018-09-04 DIAGNOSIS — E1165 Type 2 diabetes mellitus with hyperglycemia: Secondary | ICD-10-CM

## 2018-09-04 LAB — URINALYSIS, ROUTINE W REFLEX MICROSCOPIC
Bacteria, UA: NONE SEEN
Bacteria, UA: NONE SEEN
Bilirubin Urine: NEGATIVE
Bilirubin Urine: NEGATIVE
Hgb urine dipstick: NEGATIVE
Hgb urine dipstick: NEGATIVE
KETONES UR: 5 mg/dL — AB
Ketones, ur: NEGATIVE mg/dL
LEUKOCYTES UA: NEGATIVE
Leukocytes, UA: NEGATIVE
Nitrite: NEGATIVE
Nitrite: NEGATIVE
PH: 6 (ref 5.0–8.0)
Protein, ur: NEGATIVE mg/dL
Protein, ur: NEGATIVE mg/dL
SPECIFIC GRAVITY, URINE: 1.028 (ref 1.005–1.030)
Specific Gravity, Urine: 1.032 — ABNORMAL HIGH (ref 1.005–1.030)
pH: 6 (ref 5.0–8.0)

## 2018-09-04 LAB — COMPREHENSIVE METABOLIC PANEL
ALT: 26 U/L (ref 0–44)
ALT: 21 U/L (ref 0–44)
AST: 26 U/L (ref 15–41)
AST: 19 U/L (ref 15–41)
Albumin: 4.2 g/dL (ref 3.5–5.0)
Albumin: 3.6 g/dL (ref 3.5–5.0)
Alkaline Phosphatase: 92 U/L (ref 38–126)
Alkaline Phosphatase: 86 U/L (ref 38–126)
Anion gap: 14 (ref 5–15)
Anion gap: 12 (ref 5–15)
BUN: 10 mg/dL (ref 6–20)
BUN: 8 mg/dL (ref 6–20)
CO2: 20 mmol/L — ABNORMAL LOW (ref 22–32)
CO2: 22 mmol/L (ref 22–32)
Calcium: 9.9 mg/dL (ref 8.9–10.3)
Calcium: 9.4 mg/dL (ref 8.9–10.3)
Chloride: 100 mmol/L (ref 98–111)
Chloride: 104 mmol/L (ref 98–111)
Creatinine, Ser: 0.75 mg/dL (ref 0.44–1.00)
Creatinine, Ser: 0.76 mg/dL (ref 0.44–1.00)
GFR calc Af Amer: >60 mL/min (ref >60)
GFR calc non Af Amer: >60 mL/min (ref >60)
Glucose, Bld: 459 mg/dL — ABNORMAL HIGH (ref 70–99)
Glucose, Bld: 427 mg/dL — ABNORMAL HIGH (ref 70–99)
Potassium: 4.1 mmol/L (ref 3.5–5.1)
Potassium: 4.1 mmol/L (ref 3.5–5.1)
SODIUM: 138 mmol/L (ref 135–145)
Sodium: 134 mmol/L — ABNORMAL LOW (ref 135–145)
Total Bilirubin: 0.7 mg/dL (ref 0.3–1.2)
Total Bilirubin: 0.6 mg/dL (ref 0.3–1.2)
Total Protein: 8.5 g/dL — ABNORMAL HIGH (ref 6.5–8.1)
Total Protein: 7.4 g/dL (ref 6.5–8.1)

## 2018-09-04 LAB — CBC
HCT: 44.7 % (ref 36.0–46.0)
HCT: 39.6 % (ref 36.0–46.0)
Hemoglobin: 14.4 g/dL (ref 12.0–15.0)
Hemoglobin: 13.1 g/dL (ref 12.0–15.0)
MCH: 27.5 pg (ref 26.0–34.0)
MCH: 28.5 pg (ref 26.0–34.0)
MCHC: 32.2 g/dL (ref 30.0–36.0)
MCHC: 33.1 g/dL (ref 30.0–36.0)
MCV: 85.5 fL (ref 80.0–100.0)
MCV: 86.1 fL (ref 80.0–100.0)
NRBC: 0 % (ref 0.0–0.2)
PLATELETS: 245 10*3/uL (ref 150–400)
Platelets: 227 10*3/uL (ref 150–400)
RBC: 5.23 MIL/uL — AB (ref 3.87–5.11)
RBC: 4.6 MIL/uL (ref 3.87–5.11)
RDW: 12.3 % (ref 11.5–15.5)
RDW: 12.4 % (ref 11.5–15.5)
WBC: 11.6 10*3/uL — ABNORMAL HIGH (ref 4.0–10.5)
WBC: 13.5 10*3/uL — ABNORMAL HIGH (ref 4.0–10.5)
nRBC: 0 % (ref 0.0–0.2)

## 2018-09-04 LAB — I-STAT BETA HCG BLOOD, ED (MC, WL, AP ONLY): I-stat hCG, quantitative: <5 m[IU]/mL (ref <5)

## 2018-09-04 LAB — CBG MONITORING, ED
GLUCOSE-CAPILLARY: 346 mg/dL — AB (ref 70–99)
Glucose-Capillary: 427 mg/dL — ABNORMAL HIGH (ref 70–99)
Glucose-Capillary: 398 mg/dL — ABNORMAL HIGH (ref 70–99)

## 2018-09-04 LAB — LIPASE, BLOOD: Lipase: 28 U/L (ref 11–51)

## 2018-09-04 LAB — I-STAT TROPONIN, ED: Troponin i, poc: 0 ng/mL (ref 0.00–0.08)

## 2018-09-04 IMAGING — DX DG CHEST 2V
2 series · 2 of 2 positions shown · non-contrast
Comparison: [DATE]

CLINICAL DATA: Short of breath

EXAM:
CHEST - 2 VIEW

[chest pa]
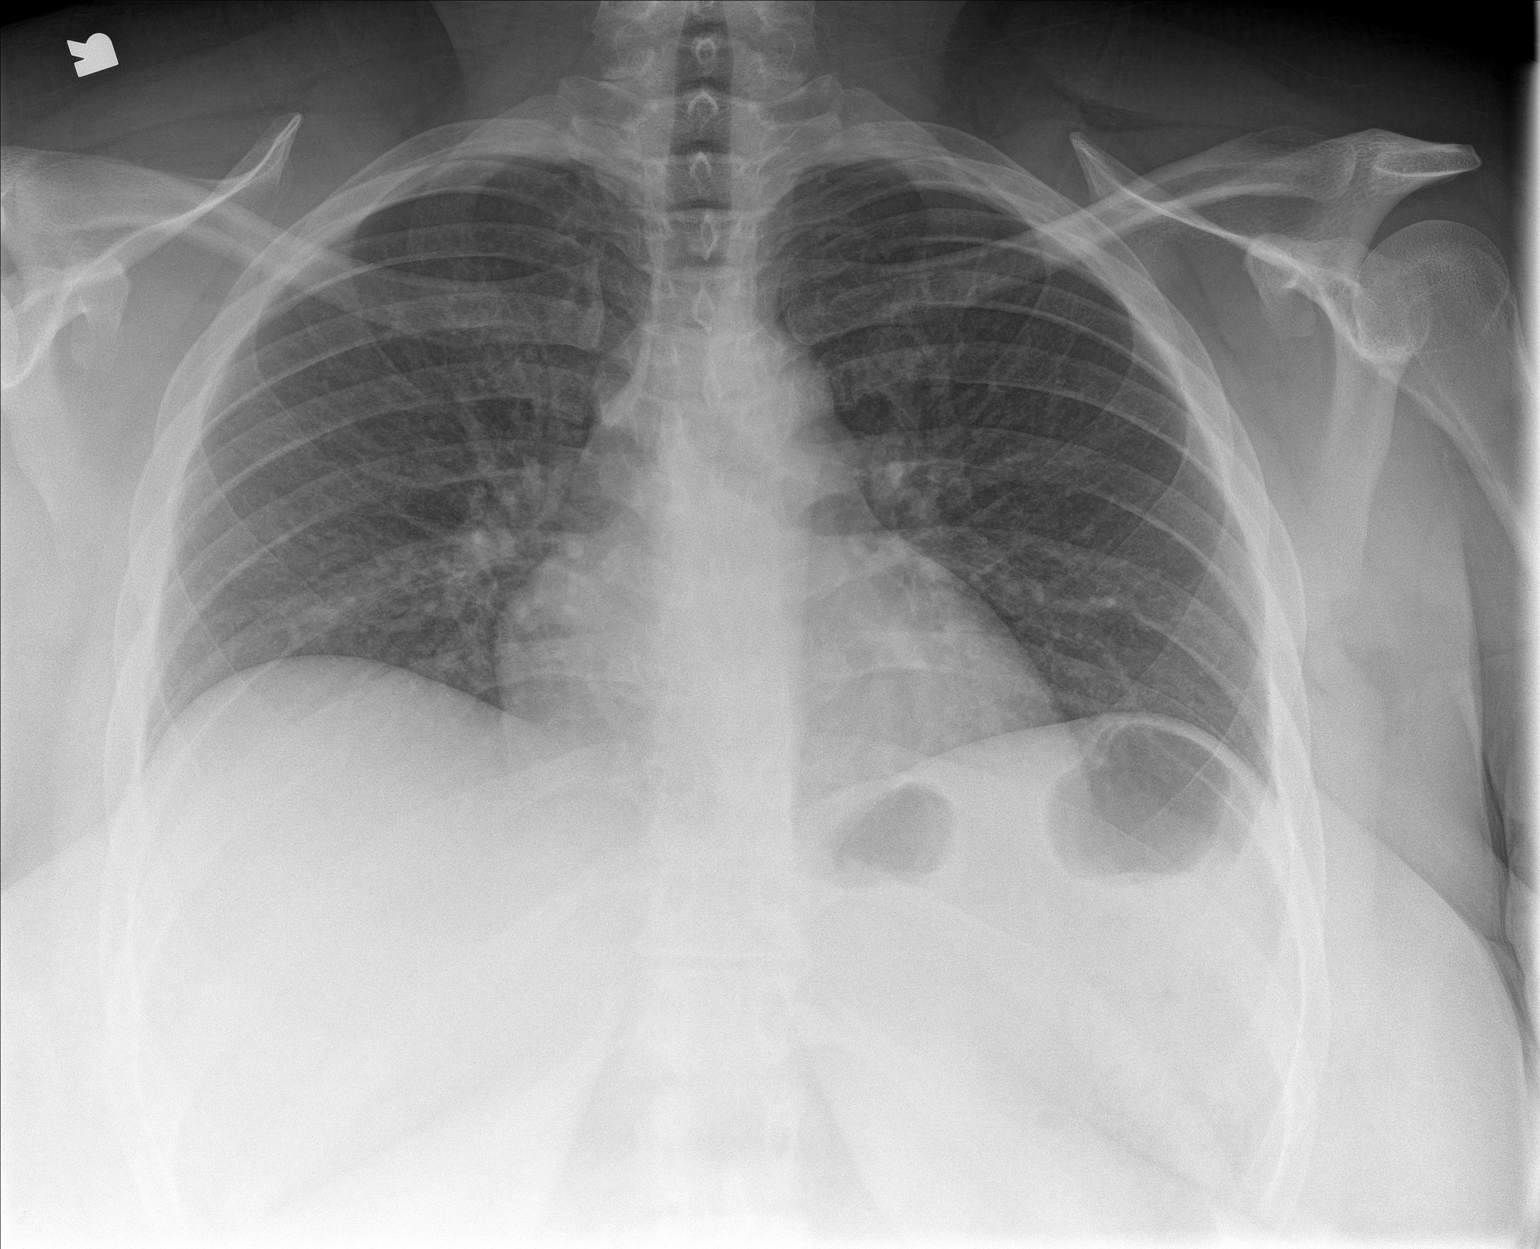

[chest lat]
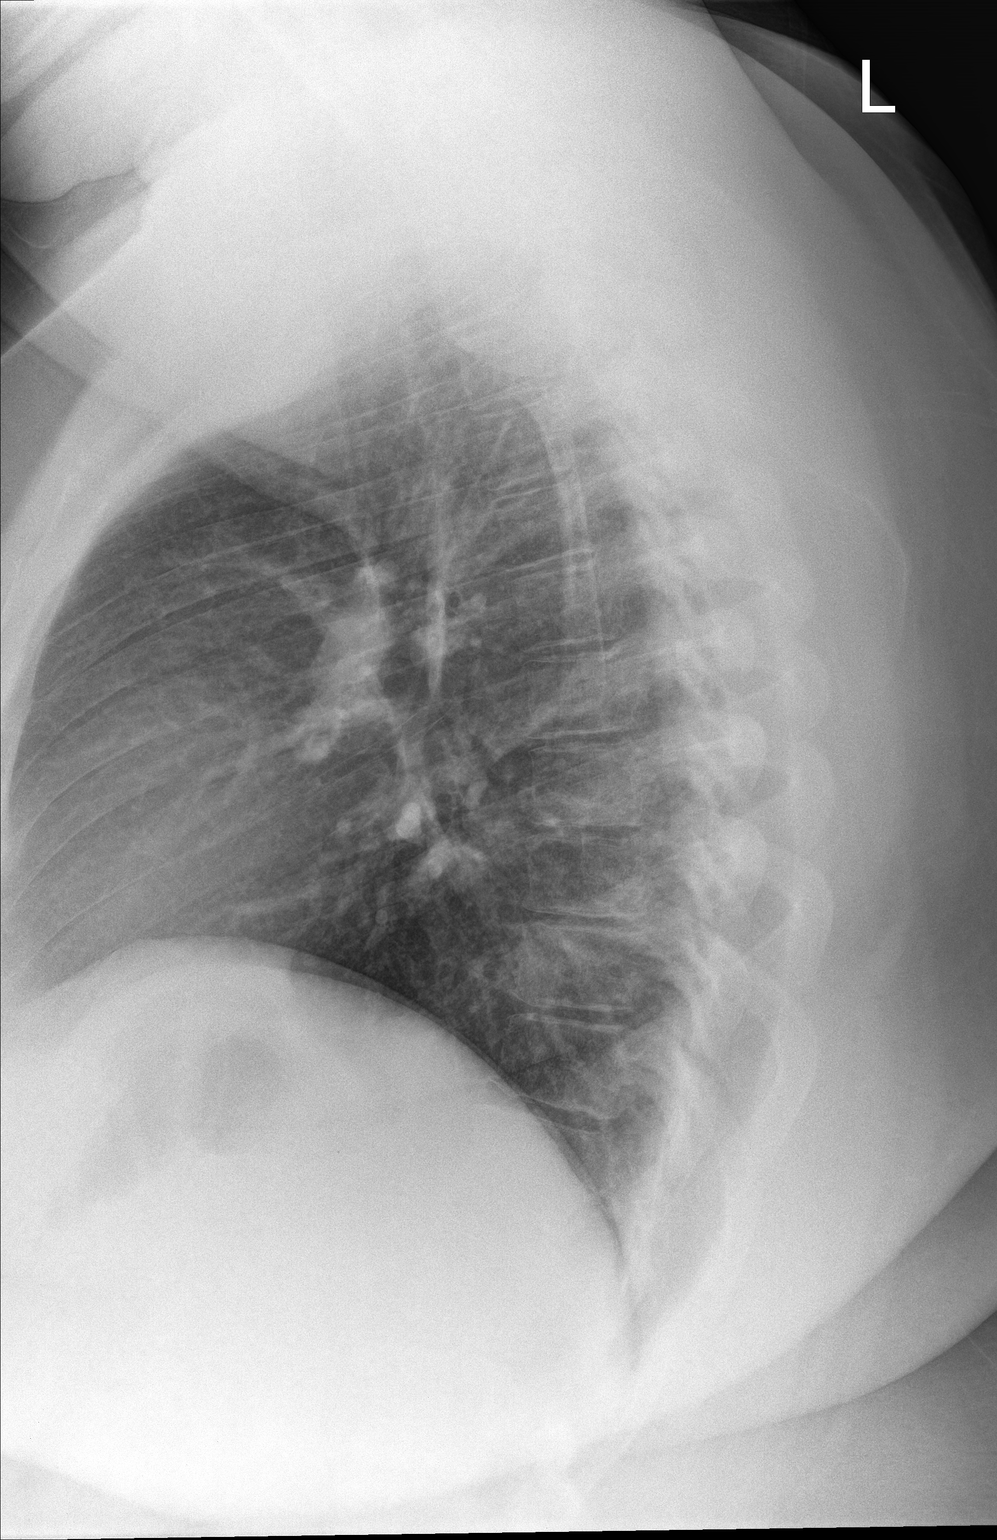

[2 of 2 positions shown; findings below may reference images not displayed]

FINDINGS: Normal heart size. Lungs are under aerated and grossly clear. No
pneumothorax. No pleural effusion.
IMPRESSION: No active cardiopulmonary disease.

## 2018-09-04 MED ORDER — SODIUM CHLORIDE 0.9 % IV BOLUS (SEPSIS): 1000.0000 mL | Freq: Once | INTRAVENOUS | Status: DC

## 2018-09-04 MED ORDER — SODIUM CHLORIDE 0.9 % IV BOLUS
1000.0000 mL | Freq: Once | INTRAVENOUS | Status: AC
  Administered 2018-09-04: 1000 mL via INTRAVENOUS

## 2018-09-04 MED ORDER — INSULIN ASPART 100 UNIT/ML ~~LOC~~ SOLN
10.0000 [IU] | Freq: Once | SUBCUTANEOUS | Status: AC
  Administered 2018-09-04: 10 [IU] via SUBCUTANEOUS

## 2018-09-04 MED ORDER — SODIUM CHLORIDE 0.9% FLUSH: 3.0000 mL | Freq: Once | INTRAVENOUS | Status: DC

## 2018-09-04 MED ORDER — INSULIN ASPART 100 UNIT/ML ~~LOC~~ SOLN
16.0000 [IU] | Freq: Once | SUBCUTANEOUS | Status: AC
  Administered 2018-09-04: 16 [IU] via SUBCUTANEOUS
  Filled 2018-09-04: qty 1

## 2018-09-04 MED ORDER — INSULIN ASPART 100 UNIT/ML ~~LOC~~ SOLN: 8.0000 [IU] | Freq: Once | SUBCUTANEOUS | Status: DC

## 2018-09-04 NOTE — ED Triage Notes (Signed)
PT states her blood sugar was 580 earlier today.  Seen at [facility] and Blood sugar down to 300s.  States when she got home it was back up to 510.  Also c/o R sided abd pain, dizziness, and vomiting.

## 2018-09-04 NOTE — ED Notes (Signed)
Pt ambulatory to the bathroom 

## 2018-09-04 NOTE — Discharge Instructions (Addendum)
It was our pleasure to provide your ER care today - we hope that you feel better.  Drink plenty of water. Follow diabetic meal plan. Continue your diabetic medication. Check sugars 4x/day and record values.   Follow up with primary care doctor in the next 1-2 days for recheck - bring log of your blood sugars to that visit.   Return to ER if worse, new symptoms, fevers, new or severe pain, persistent vomiting, other concern.

## 2018-09-04 NOTE — ED Notes (Signed)
Patient given water

## 2018-09-04 NOTE — ED Notes (Signed)
Pt states she cannot breathe. O2 sats 96% on RA. NAD. Equal rise and chest fall with no distress.

## 2018-09-04 NOTE — ED Provider Notes (Signed)
TIME SEEN: 11:03 PM  CHIEF COMPLAINT: Hyperglycemia  HPI: Patient is a 33 year old female with history of insulin-dependent diabetes who presents to the emergency department for hyperglycemia.  Patient reports that her blood sugars have been elevated over the past couple of days.  She states that she used to be on metformin but it caused too much GI upset so she stopped this medication.  She is on Lantus 20 units at bedtime but only takes this if her blood sugars are above 200.  States her blood sugars are normally in the 150s.  Was just at [facility] for hyperglycemia.  States that she has felt "swimmy headed" and had polyuria.  States her blood glucose was in the 500s.  Given subcutaneous insulin IV fluids in the ED and her blood sugar improved.  She is not in DKA and was discharged home.  States that her blood sugar was elevated again tonight and she contacted her primary care physician who instructed she come to the emergency department.  Patient states that she has had some right upper quadrant abdominal pain today.  She has known history of gallstones but states that this is not as severe.  No fevers, nausea, vomiting, diarrhea.  She is status post partial hysterectomy.  She did take her Lantus at 5 PM tonight.  ROS: See HPI Constitutional: no fever  Eyes: no drainage  ENT: no runny nose   Cardiovascular:  no chest pain  Resp: no SOB  GI: no vomiting GU: no dysuria Integumentary: no rash  Allergy: no hives  Musculoskeletal: no leg swelling  Neurological: no slurred speech ROS otherwise negative  PAST MEDICAL HISTORY/PAST SURGICAL HISTORY:  Past Medical History:  Diagnosis Date  . Anxiety   . Bipolar disorder (HCC)   . CHF (congestive heart failure) (HCC)   . CIN I (cervical intraepithelial neoplasia I)   . Diabetes mellitus without complication (HCC)   . Enlarged heart   . Nephrolithiasis   . OSA (obstructive sleep apnea)   . Tachycardia     MEDICATIONS:  Prior to  Admission medications   Medication Sig Start Date End Date Taking? Authorizing Provider  EPINEPHrine 0.3 mg/0.3 mL IJ SOAJ injection Inject 0.3 mg into the muscle once.    [provider]  insulin glargine (LANTUS) 100 UNIT/ML injection Inject 0.1 mLs (10 Units total) into the skin at bedtime. Patient taking differently: Inject 40 Units into the skin at bedtime.  08/30/16   Adonis Brook, NP    ALLERGIES:  Allergies  Allergen Reactions  . Asa Buff (Mag [Buffered Aspirin] Shortness Of Breath  . Aspirin Anaphylaxis and Shortness Of Breath  . Tolmetin Shortness Of Breath  . Prozac [Fluoxetine Hcl]     SOCIAL HISTORY:  Social History   Tobacco Use  . Smoking status: Current Every Day Smoker    Packs/day: 1.50    Types: Cigarettes  . Smokeless tobacco: Never Used  Substance Use Topics  . Alcohol use: Yes    Comment: occasionally    FAMILY HISTORY: Family History  Problem Relation Age of Onset  . Hypertension Mother   . Depression Mother   . Diabetes Mother   . Cancer Mother        brain tumor  . Hypertension Father   . Diabetes Father   . Depression Father   . Hypertension Sister   . Diabetes Sister   . Depression Sister   . Depression Brother   . Diabetes Brother   . Hypertension Brother   .  Cancer Maternal Aunt   . Cancer Paternal Aunt   . Hypertension Maternal Grandmother   . Diabetes Maternal Grandmother   . Depression Maternal Grandmother   . Hypertension Maternal Grandfather   . Diabetes Maternal Grandfather   . Depression Maternal Grandfather   . Hypertension Paternal Grandmother   . Diabetes Paternal Grandmother   . Depression Paternal Grandmother   . Hypertension Paternal Grandfather   . Diabetes Paternal Grandfather   . Depression Paternal Grandfather     EXAM: BP (!) 144/109 (BP Location: Right Arm)   Pulse (!) 107   Temp 98.6 F (37 C) (Oral)   Resp 20   SpO2 98%  CONSTITUTIONAL: Alert and oriented and responds appropriately to  questions. Well-appearing; well-nourished, obese, afebrile, nontoxic HEAD: Normocephalic EYES: Conjunctivae clear, pupils appear equal, EOMI ENT: normal nose; moist mucous membranes NECK: Supple, no meningismus, no nuchal rigidity, no LAD  CARD: RRR; S1 and S2 appreciated; no murmurs, no clicks, no rubs, no gallops RESP: Normal chest excursion without splinting or tachypnea; breath sounds clear and equal bilaterally; no wheezes, no rhonchi, no rales, no hypoxia or respiratory distress, speaking full sentences ABD/GI: Normal bowel sounds; non-distended; soft, minimally tender in the right upper quadrant, no rebound, no guarding, no peritoneal signs, no hepatosplenomegaly BACK:  The back appears normal and is non-tender to palpation, there is no CVA tenderness EXT: Normal ROM in all joints; non-tender to palpation; no edema; normal capillary refill; no cyanosis, no calf tenderness or swelling    SKIN: Normal color for age and race; warm; no rash NEURO: Moves all extremities equally PSYCH: The patient's mood and manner are appropriate. Grooming and personal hygiene are appropriate.  MEDICAL DECISION MAKING: Patient here with hyperglycemia.  Labs repeated and again show that patient is not in DKA.  Her blood glucose is 398.  I have offered her fluids and IV insulin but this time she declines.  She agrees to a dose of subcutaneous insulin and will monitor her blood sugar closely at home.  She states that she has a prescription for metformin at home and will restart this over the weekend as well as make sure to take her Lantus every day.  States that she will watch her diet.  She told nursing staff that she did eat fast food after leaving the emergency department [facility].  I have also recommended very close follow-up with her primary care physician as she may need to be on other agents to manage her blood sugar and that this is not something normally we would do from the emergency department.     Discussed with patient that we can perform a right upper quadrant abdominal ultrasound at this time for evaluation of her abdominal pain.  She does have a mild leukocytosis here.  She states that her pain is significantly improved and she declines further work-up at this time.  Her LFTs and lipase are normal.  He states that this does not feel as severe as her biliary colic has in the past.  I have offered her pain and nausea medicine but she declines.  She states that she would prefer discharge home with close follow-up with her outpatient provider.  I feel this is reasonable.  Have discussed at length return precautions.  Patient and husband verbalized understanding and are comfortable with plan.  At this time, I do not feel there is any life-threatening condition present. I have reviewed and discussed all results (EKG, imaging, lab, urine as appropriate) and  exam findings with patient/family. I have reviewed nursing notes and appropriate previous records.  I feel the patient is safe to be discharged home without further emergent workup and can continue workup as an outpatient as needed. Discussed usual and customary return precautions. Patient/family verbalize understanding and are comfortable with this plan.  Outpatient follow-up has been provided as needed. All questions have been answered.      Rondale Nies, Layla MawKristen N, DO 09/05/18 [phone]

## 2018-09-04 NOTE — ED Triage Notes (Signed)
Pt reports elevated CBGs >500 that began yesterday. Pt states she took 40 units of Lantus last night. CBG 499 PTA. Also endorses generalized weakness, difficulty "getting words out," and SOB.

## 2018-09-04 NOTE — ED Provider Notes (Signed)
[facility] EMERGENCY DEPARTMENT Provider Note   CSN: [id number] Arrival date & time: 09/04/18  0092     History   Chief Complaint Chief Complaint  Patient presents with  . Hyperglycemia    HPI Nancy [name] is a 33 y.o. female.  Patient with hx iddm, indicates in past couple days blood sugars high, in 500 range. Symptoms gradual onset, persistent, worse today.  Indicates compliant w insulin. +polyuria. No dysuria. No vomiting or diarrhea. No abd pain. No fever or chills. +generally feels weak. No focal or unilateral numbness or weakness. No change in speech/vision. No chest pain or discomfort.   The history is provided by the patient.  Hyperglycemia  Associated symptoms: polyuria   Associated symptoms: no abdominal pain, no chest pain, no confusion, no fever, no shortness of breath and no vomiting     Past Medical History:  Diagnosis Date  . Anxiety   . Bipolar disorder (HCC)   . CHF (congestive heart failure) (HCC)   . CIN I (cervical intraepithelial neoplasia I)   . Diabetes mellitus without complication (HCC)   . Enlarged heart   . Nephrolithiasis   . OSA (obstructive sleep apnea)   . Tachycardia     Patient Active Problem List   Diagnosis Date Noted  . Bipolar affective disorder, depressed, severe (HCC) 08/27/2016  . Opiate abuse, episodic (HCC) 04/09/2015  . MDD (major depressive disorder), recurrent episode, severe (HCC) 04/09/2015  . Suicide attempt by other tranquilizer drug overdose (HCC) 04/09/2015  . 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 Dysmenorrhea 07/06/2013  . Dysmenorrhea 06/09/2013  . Menorrhagia with regular cycle 06/09/2013  . Leg edema 10/03/2011  . Edema leg 10/03/2011  . Bipolar I disorder, most recent episode depressed (HCC) 09/26/2011  . Morbid obesity (HCC) 09/26/2011  . Weakness of both legs 09/04/2011  . Difficulty in walking(719.7) 09/04/2011  . 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 BLADDER AUGMENTATION  2001   enlargement  . DILITATION & CURRETTAGE/HYSTROSCOPY WITH THERMACHOICE ABLATION N/A 07/06/2013   Procedure: DILATATION & CURETTAGE/HYSTEROSCOPY WITH THERMACHOICE ABLATION;  Surgeon: Tilda Burrow, MD;  Location: [facility];  Service: Gynecology;  Laterality: N/A;  total therapt time- 9 minutes 2 seconds; 87C  . LAPAROSCOPIC BILATERAL SALPINGECTOMY Bilateral 07/06/2013   Procedure: LAPAROSCOPIC BILATERAL SALPINGECTOMY;  Surgeon: Tilda Burrow, MD;  Location: [facility];  Service: Gynecology;  Laterality: Bilateral;     OB History   No obstetric history on file.      Home Medications    Prior to Admission medications   Medication Sig Start Date End Date Taking? Authorizing Provider  EPINEPHrine 0.3 mg/0.3 mL IJ SOAJ injection Inject 0.3 mg into the muscle once.    [provider]  insulin glargine (LANTUS) 100 UNIT/ML injection Inject 0.1 mLs (10 Units total) into the skin at bedtime. Patient taking differently: Inject 20 Units into the skin at bedtime.  08/30/16   Adonis Brook, NP  metFORMIN (GLUCOPHAGE) 500 MG tablet Take 500 mg by mouth 2 (two) times daily with a meal.    [provider]    Family History Family History  Problem Relation Age of Onset  . Hypertension Mother   . Depression Mother   . Diabetes Mother   . Cancer Mother        brain tumor  . Hypertension Father   . Diabetes Father   . Depression Father   . Hypertension  Sister   . Diabetes Sister   . Depression Sister   . Depression Brother   . Diabetes Brother   . Hypertension Brother   . Cancer Maternal Aunt   . Cancer Paternal Aunt   . Hypertension Maternal Grandmother   . Diabetes Maternal Grandmother   . Depression Maternal Grandmother   . Hypertension Maternal Grandfather   . Diabetes Maternal Grandfather   . Depression Maternal Grandfather   . Hypertension Paternal Grandmother   . Diabetes Paternal Grandmother   .  Depression Paternal Grandmother   . Hypertension Paternal Grandfather   . Diabetes Paternal Grandfather   . Depression Paternal Grandfather     Social History Social History   Tobacco Use  . Smoking status: Current Every Day Smoker    Packs/day: 1.50    Types: Cigarettes  . Smokeless tobacco: Never Used  Substance Use Topics  . Alcohol use: Yes    Comment: occasionally  . Drug use: No     Allergies   Asa buff (mag [buffered aspirin]; Aspirin; Tolmetin; and Prozac [fluoxetine hcl]   Review of Systems Review of Systems  Constitutional: Negative for fever.  HENT: Negative for sore throat.   Eyes: Negative for redness.  Respiratory: Negative for cough and shortness of breath.   Cardiovascular: Negative for chest pain.  Gastrointestinal: Negative for abdominal pain and vomiting.  Endocrine: Positive for polyuria.  Genitourinary: Negative for flank pain.  Musculoskeletal: Negative for back pain, neck pain and neck stiffness.  Skin: Negative for rash.  Neurological: Negative for headaches.  Hematological: Does not bruise/bleed easily.  Psychiatric/Behavioral: Negative for confusion.     Physical Exam Updated Vital Signs BP (!) 156/98 (BP Location: Right Arm)   Pulse (!) 104   Temp 98.6 F (37 C) (Oral)   Resp 20   Ht 1.702 m (5\' 7" )   Wt (!) 163.3 kg   SpO2 97%   BMI 56.38 kg/m   Physical Exam Vitals signs and nursing note reviewed.  Constitutional:      Appearance: Normal appearance. She is well-developed.  HENT:     Head: Atraumatic.     Nose: Nose normal.     Mouth/Throat:     Mouth: Mucous membranes are moist.  Eyes:     General: No scleral icterus.    Conjunctiva/sclera: Conjunctivae normal.     Pupils: Pupils are equal, round, and reactive to light.  Neck:     Musculoskeletal: Normal range of motion and neck supple. No neck rigidity or muscular tenderness.     Trachea: No tracheal deviation.     Comments: No TM. Supple, no stiffness or rigidity.    Cardiovascular:     Rate and Rhythm: Normal rate and regular rhythm.     Pulses: Normal pulses.     Heart sounds: Normal heart sounds. No murmur. No friction rub. No gallop.   Pulmonary:     Effort: Pulmonary effort is normal. No respiratory distress.     Breath sounds: Normal breath sounds.  Abdominal:     General: Bowel sounds are normal. There is no distension.     Palpations: Abdomen is soft.     Tenderness: There is no abdominal tenderness. There is no guarding.  Genitourinary:    Comments: No cva tenderness.  Musculoskeletal:        General: No swelling or tenderness.  Skin:    General: Skin is warm and dry.     Findings: No rash.  Neurological:     Mental  Status: She is alert.     Comments: Alert, speech clear/fluent. Motor intact bil, stre 5/5. sens grossly intact. Steady gait.   Psychiatric:        Mood and Affect: Mood normal.      ED Treatments / Results  Labs (all labs ordered are listed, but only abnormal results are displayed) Results for orders placed or performed during the hospital encounter of 09/04/18  CBC  Result Value Ref Range   WBC 11.6 (H) 4.0 - 10.5 K/uL   RBC 5.23 (H) 3.87 - 5.11 MIL/uL   Hemoglobin 14.4 12.0 - 15.0 g/dL   HCT 16.1 09.6 - 04.5 %   MCV 85.5 80.0 - 100.0 fL   MCH 27.5 26.0 - 34.0 pg   MCHC 32.2 30.0 - 36.0 g/dL   RDW 40.9 81.1 - 91.4 %   Platelets 245 150 - 400 K/uL   nRBC 0.0 0.0 - 0.2 %  Urinalysis, Routine w reflex microscopic  Result Value Ref Range   Color, Urine STRAW (A) YELLOW   APPearance CLEAR CLEAR   Specific Gravity, Urine 1.028 1.005 - 1.030   pH 6.0 5.0 - 8.0   Glucose, UA >=500 (A) NEGATIVE mg/dL   Hgb urine dipstick NEGATIVE NEGATIVE   Bilirubin Urine NEGATIVE NEGATIVE   Ketones, ur 5 (A) NEGATIVE mg/dL   Protein, ur NEGATIVE NEGATIVE mg/dL   Nitrite NEGATIVE NEGATIVE   Leukocytes, UA NEGATIVE NEGATIVE   WBC, UA 0-5 0 - 5 WBC/hpf   Bacteria, UA NONE SEEN NONE SEEN   Squamous Epithelial / LPF 0-5 0 - 5   Comprehensive metabolic panel  Result Value Ref Range   Sodium 134 (L) 135 - 145 mmol/L   Potassium 4.1 3.5 - 5.1 mmol/L   Chloride 100 98 - 111 mmol/L   CO2 20 (L) 22 - 32 mmol/L   Glucose, Bld 459 (H) 70 - 99 mg/dL   BUN 10 6 - 20 mg/dL   Creatinine, Ser 7.82 0.44 - 1.00 mg/dL   Calcium 9.9 8.9 - 95.6 mg/dL   Total Protein 8.5 (H) 6.5 - 8.1 g/dL   Albumin 4.2 3.5 - 5.0 g/dL   AST 26 15 - 41 U/L   ALT 26 0 - 44 U/L   Alkaline Phosphatase 92 38 - 126 U/L   Total Bilirubin 0.7 0.3 - 1.2 mg/dL   GFR calc non Af Amer >60 >60 mL/min   GFR calc Af Amer >60 >60 mL/min   Anion gap 14 5 - 15  CBG monitoring, ED  Result Value Ref Range   Glucose-Capillary 427 (H) 70 - 99 mg/dL  CBG monitoring, ED  Result Value Ref Range   Glucose-Capillary 346 (H) 70 - 99 mg/dL  I-stat troponin, ED  Result Value Ref Range   Troponin i, poc 0.00 0.00 - 0.08 ng/mL   Comment 3            EKG EKG Interpretation  Date/Time:  Friday September 04 2018 09:46:16 EST Ventricular Rate:  94 PR Interval:    QRS Duration: 81 QT Interval:  369 QTC Calculation: 462 R Axis:   42 Text Interpretation:  Sinus rhythm Baseline wander Poor data quality Confirmed by Cathren Laine ([id number]) on 09/04/2018 9:58:11 AM   Radiology Dg Chest 2 View  Result Date: 09/04/2018 CLINICAL DATA:  Short of breath EXAM: CHEST - 2 VIEW COMPARISON:  09/07/2017 FINDINGS: Normal heart size. Lungs are under aerated and grossly clear. No pneumothorax. No pleural effusion. IMPRESSION: No active  cardiopulmonary disease. Electronically Signed   By: Jolaine Click M.D.   On: 09/04/2018 12:31    Procedures Procedures (including critical care time)  Medications Ordered in ED Medications  sodium chloride 0.9 % bolus 1,000 mL (has no administration in time range)     Initial Impression / Assessment and Plan / ED Course  I have reviewed the triage vital signs and the nursing notes.  Pertinent labs & imaging results that were available during  my care of the patient were reviewed by me and considered in my medical decision making (see chart for details).  Iv ns bolus. Labs sent.   Initial 1 liter bolus ns.   Reviewed nursing notes and prior charts for additional history.   Labs reviewed - glucose elev. hc03 normal. novoog sq.  Trop is normal/0.   Recheck, vital signs normal.  Tolerating po. No vomiting. Communicating on phone.   cxr reviewed - no pna.   Additional 1 liter ns bolus.  Recheck, blood sugar improved.   Pt requests d/c.   Pt currently appears stable for d/c.    Final Clinical Impressions(s) / ED Diagnoses   Final diagnoses:  None    ED Discharge Orders    None       Cathren Laine, MD 09/04/18 1246

## 2018-09-04 NOTE — ED Notes (Signed)
Pt stated she felt like she had an elephant on her chest. EKG done and given to Dr. Denton Lank

## 2018-09-05 NOTE — Discharge Instructions (Addendum)
Please follow-up closely with your primary care physician as they need to be the ones to adjust your insulin regimen.  I recommend that you take your Lantus 20 units every night.  You may start with metformin 500 mg once daily and then increase to twice daily in 1 week.  If you have worsening abdominal pain, fevers of 100.4 or higher, vomiting and cannot stop, blood sugars over 500 that do not improve, please return to the emergency department.   Steps to find a Primary Care Provider (PCP):  Call [phone] or [phone] to access "[place] Find a Doctor Service."  2.  You may also go on the [facility] website at [email or web address]  3.  [facility] also frequently accepts new patients.  [facility]  [address] [place] [id number] [phone]  4.  There are also multiple [facility], [facility], [place] and [facility] practices throughout the [place] that are frequently accepting new patients. You may find a clinic that is close to your home and contact them.  [email or web address] [phone]  [facility] [email or web address]  [email or web address] [phone]  [email or web address] [phone]  5.  Local Health Departments also can provide primary care services.  [facility]  [address] [place] [id number] [phone]  [facility] [address] [place] [address] [phone]  [facility] [address] [place] [address]  [place] [phone]

## 2019-06-02 ENCOUNTER — Other Ambulatory Visit: Payer: Self-pay | Admitting: Nurse Practitioner

## 2019-06-02 DIAGNOSIS — R202 Paresthesia of skin: Secondary | ICD-10-CM

## 2019-06-02 DIAGNOSIS — R200 Anesthesia of skin: Secondary | ICD-10-CM

## 2019-06-08 ENCOUNTER — Other Ambulatory Visit

## 2019-06-17 ENCOUNTER — Other Ambulatory Visit

## 2019-09-14 ENCOUNTER — Other Ambulatory Visit: Payer: Self-pay | Admitting: Nurse Practitioner

## 2019-09-14 DIAGNOSIS — G8929 Other chronic pain: Secondary | ICD-10-CM

## 2019-09-14 DIAGNOSIS — M545 Low back pain, unspecified: Secondary | ICD-10-CM

## 2019-10-05 ENCOUNTER — Other Ambulatory Visit

## 2019-11-27 ENCOUNTER — Other Ambulatory Visit: Payer: Self-pay

## 2019-11-27 ENCOUNTER — Emergency Department (HOSPITAL_COMMUNITY): Payer: BC Managed Care – PPO

## 2019-11-27 ENCOUNTER — Encounter (HOSPITAL_COMMUNITY): Payer: Self-pay | Admitting: Emergency Medicine

## 2019-11-27 ENCOUNTER — Emergency Department (HOSPITAL_COMMUNITY)
Admission: EM | Admit: 2019-11-27 | Discharge: 2019-11-27 | Disposition: A | Discharge status: Home / Self Care | Payer: BC Managed Care – PPO | Attending: Emergency Medicine | Admitting: Emergency Medicine

## 2019-11-27 DIAGNOSIS — R600 Localized edema: Secondary | ICD-10-CM | POA: Insufficient documentation

## 2019-11-27 DIAGNOSIS — R0789 Other chest pain: Secondary | ICD-10-CM | POA: Insufficient documentation

## 2019-11-27 DIAGNOSIS — Z5321 Procedure and treatment not carried out due to patient leaving prior to being seen by health care provider: Secondary | ICD-10-CM | POA: Insufficient documentation

## 2019-11-27 DIAGNOSIS — R0602 Shortness of breath: Secondary | ICD-10-CM | POA: Insufficient documentation

## 2019-11-27 LAB — BASIC METABOLIC PANEL
Anion gap: 11 (ref 5–15)
BUN: 5 mg/dL — ABNORMAL LOW (ref 6–20)
CO2: 23 mmol/L (ref 22–32)
Calcium: 8.8 mg/dL — ABNORMAL LOW (ref 8.9–10.3)
Chloride: 103 mmol/L (ref 98–111)
Creatinine, Ser: 0.59 mg/dL (ref 0.44–1.00)
GFR calc Af Amer: >60 mL/min (ref >60)
GFR calc non Af Amer: >60 mL/min (ref >60)
Glucose, Bld: 298 mg/dL — ABNORMAL HIGH (ref 70–99)
Potassium: 3.8 mmol/L (ref 3.5–5.1)
Sodium: 137 mmol/L (ref 135–145)

## 2019-11-27 LAB — CBC
HCT: 40.9 % (ref 36.0–46.0)
Hemoglobin: 13.5 g/dL (ref 12.0–15.0)
MCH: 28.1 pg (ref 26.0–34.0)
MCHC: 33 g/dL (ref 30.0–36.0)
MCV: 85.2 fL (ref 80.0–100.0)
Platelets: 223 10*3/uL (ref 150–400)
RBC: 4.8 MIL/uL (ref 3.87–5.11)
RDW: 12.5 % (ref 11.5–15.5)
WBC: 6 10*3/uL (ref 4.0–10.5)
nRBC: 0 % (ref 0.0–0.2)

## 2019-11-27 LAB — I-STAT BETA HCG BLOOD, ED (MC, WL, AP ONLY): I-stat hCG, quantitative: <5 m[IU]/mL (ref <5)

## 2019-11-27 LAB — PROTIME-INR
INR: 1 (ref 0.8–1.2)
Prothrombin Time: 12.5 seconds (ref 11.4–15.2)

## 2019-11-27 LAB — TROPONIN I (HIGH SENSITIVITY): Troponin I (High Sensitivity): 3 ng/L (ref <18)

## 2019-11-27 IMAGING — DX DG CHEST 2V
2 series · 2 of 2 positions shown · non-contrast
Comparison: [DATE]

CLINICAL DATA: Chest pain

EXAM:
CHEST - 2 VIEW

[chest lat]
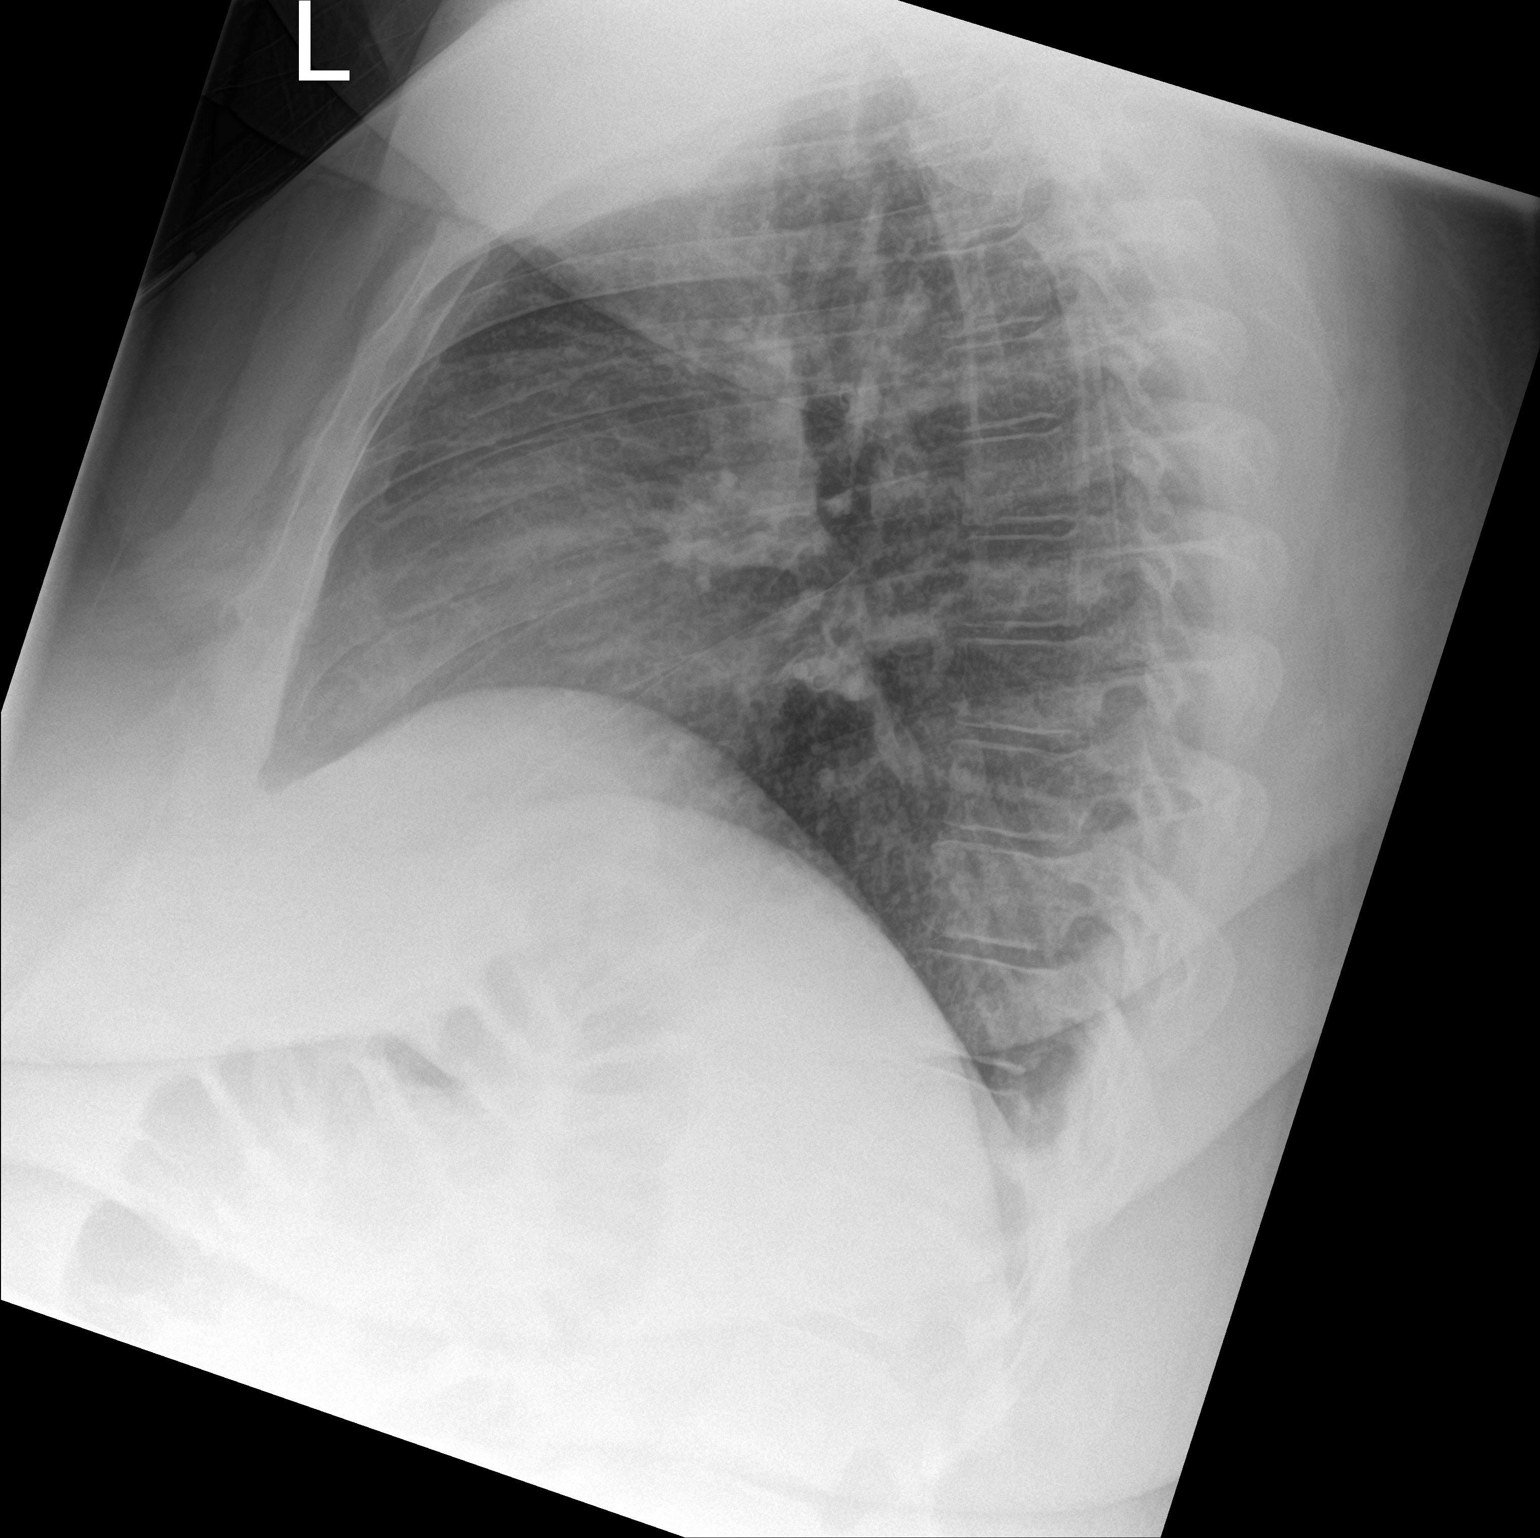

[chest ap]
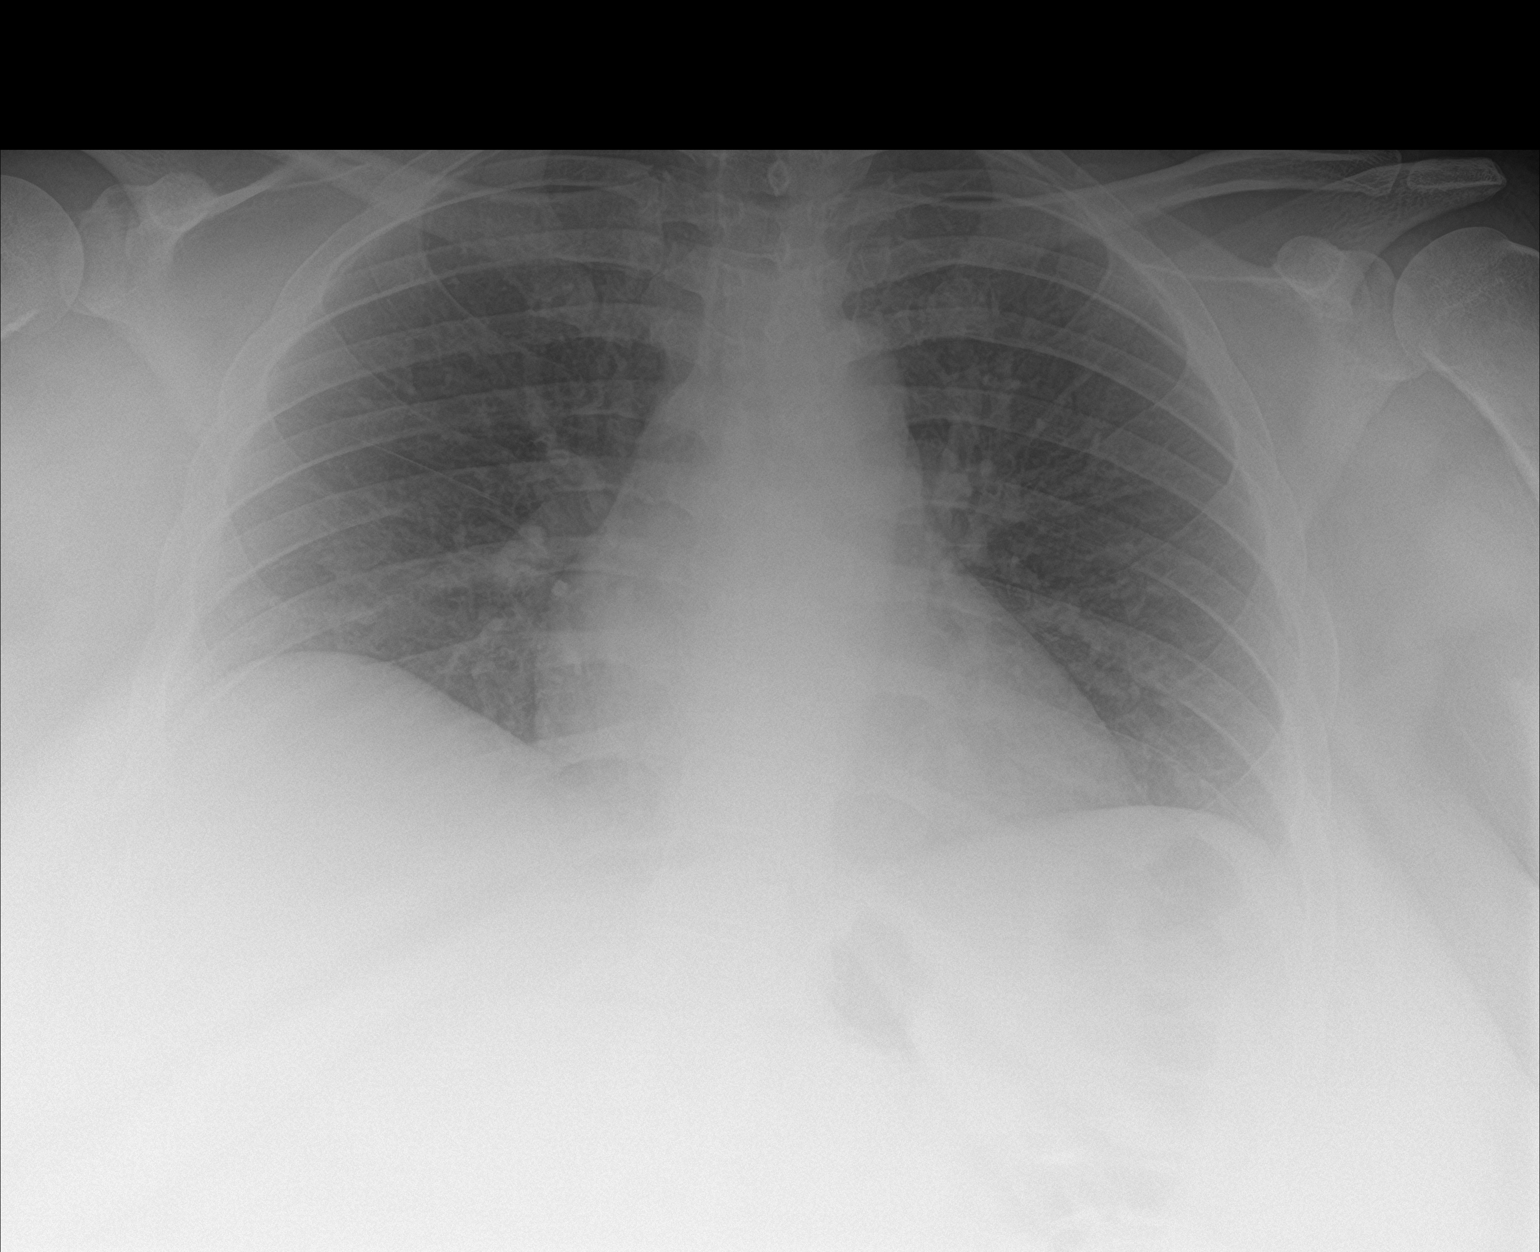

[2 of 2 positions shown; findings below may reference images not displayed]

FINDINGS: The heart size and mediastinal contours are within normal limits.
Both lungs are clear. The visualized skeletal structures are
unremarkable.
IMPRESSION: No active cardiopulmonary disease.

## 2019-11-27 MED ORDER — SODIUM CHLORIDE 0.9% FLUSH: 3.0000 mL | Freq: Once | INTRAVENOUS | Status: DC

## 2019-11-27 NOTE — ED Triage Notes (Signed)
Patient reports left chest pain with SOB onset this evening , denies emesis or diaphoresis , patient added bilateral lower legs edema and she fell today at home , denies injury from the fall .

## 2019-11-27 NOTE — ED Notes (Signed)
Pt approached this NT and spoke of her decision to leave the hospital due to the amount of time she had spent & anticipated to spend in the waiting room. This NT encouraged her to stay and also reminded the pt that her time in the waiting room would restart upon her exit and re-admittance. Pt acknowledged this and left the facility.

## 2020-03-12 ENCOUNTER — Other Ambulatory Visit: Payer: Self-pay

## 2020-03-12 ENCOUNTER — Emergency Department (HOSPITAL_COMMUNITY)
Admission: EM | Admit: 2020-03-12 | Discharge: 2020-03-12 | Disposition: A | Discharge status: Home / Self Care | Attending: Emergency Medicine | Admitting: Emergency Medicine

## 2020-03-12 ENCOUNTER — Encounter (HOSPITAL_COMMUNITY): Payer: Self-pay | Admitting: Emergency Medicine

## 2020-03-12 ENCOUNTER — Emergency Department (HOSPITAL_COMMUNITY)

## 2020-03-12 DIAGNOSIS — R739 Hyperglycemia, unspecified: Secondary | ICD-10-CM

## 2020-03-12 DIAGNOSIS — E119 Type 2 diabetes mellitus without complications: Secondary | ICD-10-CM | POA: Insufficient documentation

## 2020-03-12 DIAGNOSIS — R531 Weakness: Secondary | ICD-10-CM | POA: Diagnosis not present

## 2020-03-12 DIAGNOSIS — R200 Anesthesia of skin: Secondary | ICD-10-CM | POA: Diagnosis not present

## 2020-03-12 DIAGNOSIS — E669 Obesity, unspecified: Secondary | ICD-10-CM | POA: Insufficient documentation

## 2020-03-12 DIAGNOSIS — F17210 Nicotine dependence, cigarettes, uncomplicated: Secondary | ICD-10-CM | POA: Insufficient documentation

## 2020-03-12 DIAGNOSIS — R55 Syncope and collapse: Secondary | ICD-10-CM | POA: Diagnosis present

## 2020-03-12 DIAGNOSIS — Z79899 Other long term (current) drug therapy: Secondary | ICD-10-CM | POA: Diagnosis not present

## 2020-03-12 LAB — URINALYSIS, ROUTINE W REFLEX MICROSCOPIC
Bacteria, UA: NONE SEEN
Bilirubin Urine: NEGATIVE
Glucose, UA: >=500 mg/dL — AB
Ketones, ur: NEGATIVE mg/dL
Nitrite: NEGATIVE
Protein, ur: NEGATIVE mg/dL
Specific Gravity, Urine: 1.032 — ABNORMAL HIGH (ref 1.005–1.030)
pH: 5 (ref 5.0–8.0)

## 2020-03-12 LAB — CBC WITH DIFFERENTIAL/PLATELET
Abs Immature Granulocytes: 0.01 10*3/uL (ref 0.00–0.07)
Basophils Absolute: 0.1 10*3/uL (ref 0.0–0.1)
Basophils Relative: 1 %
Eosinophils Absolute: 0.2 10*3/uL (ref 0.0–0.5)
Eosinophils Relative: 2 %
HCT: 44.1 % (ref 36.0–46.0)
Hemoglobin: 14.3 g/dL (ref 12.0–15.0)
Immature Granulocytes: 0 %
Lymphocytes Relative: 56 %
Lymphs Abs: 3.6 10*3/uL (ref 0.7–4.0)
MCH: 28.3 pg (ref 26.0–34.0)
MCHC: 32.4 g/dL (ref 30.0–36.0)
MCV: 87.3 fL (ref 80.0–100.0)
Monocytes Absolute: 0.4 10*3/uL (ref 0.1–1.0)
Monocytes Relative: 6 %
Neutro Abs: 2.3 10*3/uL (ref 1.7–7.7)
Neutrophils Relative %: 35 %
Platelets: 274 10*3/uL (ref 150–400)
RBC: 5.05 MIL/uL (ref 3.87–5.11)
RDW: 11.9 % (ref 11.5–15.5)
WBC: 6.5 10*3/uL (ref 4.0–10.5)
nRBC: 0 % (ref 0.0–0.2)

## 2020-03-12 LAB — I-STAT VENOUS BLOOD GAS, ED
Acid-Base Excess: 4 mmol/L — ABNORMAL HIGH (ref 0.0–2.0)
Bicarbonate: 28.5 mmol/L — ABNORMAL HIGH (ref 20.0–28.0)
Calcium, Ion: 1.04 mmol/L — ABNORMAL LOW (ref 1.15–1.40)
HCT: 43 % (ref 36.0–46.0)
Hemoglobin: 14.6 g/dL (ref 12.0–15.0)
O2 Saturation: 98 %
Potassium: 5.1 mmol/L (ref 3.5–5.1)
Sodium: 132 mmol/L — ABNORMAL LOW (ref 135–145)
TCO2: 30 mmol/L (ref 22–32)
pCO2, Ven: 40.7 mmHg — ABNORMAL LOW (ref 44.0–60.0)
pH, Ven: 7.454 — ABNORMAL HIGH (ref 7.250–7.430)
pO2, Ven: 97 mmHg — ABNORMAL HIGH (ref 32.0–45.0)

## 2020-03-12 LAB — ETHANOL: Alcohol, Ethyl (B): <10 mg/dL (ref <10)

## 2020-03-12 LAB — COMPREHENSIVE METABOLIC PANEL
ALT: 26 U/L (ref 0–44)
AST: 35 U/L (ref 15–41)
Albumin: 3.3 g/dL — ABNORMAL LOW (ref 3.5–5.0)
Alkaline Phosphatase: 113 U/L (ref 38–126)
Anion gap: 12 (ref 5–15)
BUN: 11 mg/dL (ref 6–20)
CO2: 22 mmol/L (ref 22–32)
Calcium: 9.2 mg/dL (ref 8.9–10.3)
Chloride: 96 mmol/L — ABNORMAL LOW (ref 98–111)
Creatinine, Ser: 0.67 mg/dL (ref 0.44–1.00)
GFR calc Af Amer: >60 mL/min (ref >60)
GFR calc non Af Amer: >60 mL/min (ref >60)
Glucose, Bld: 511 mg/dL (ref 70–99)
Potassium: 5.6 mmol/L — ABNORMAL HIGH (ref 3.5–5.1)
Sodium: 130 mmol/L — ABNORMAL LOW (ref 135–145)
Total Bilirubin: 1.4 mg/dL — ABNORMAL HIGH (ref 0.3–1.2)
Total Protein: 7 g/dL (ref 6.5–8.1)

## 2020-03-12 LAB — CBG MONITORING, ED
Glucose-Capillary: 449 mg/dL — ABNORMAL HIGH (ref 70–99)
Glucose-Capillary: 366 mg/dL — ABNORMAL HIGH (ref 70–99)

## 2020-03-12 LAB — I-STAT BETA HCG BLOOD, ED (MC, WL, AP ONLY): I-stat hCG, quantitative: <5 m[IU]/mL (ref <5)

## 2020-03-12 LAB — RAPID URINE DRUG SCREEN, HOSP PERFORMED
Amphetamines: NOT DETECTED
Barbiturates: NOT DETECTED
Benzodiazepines: NOT DETECTED
Cocaine: NOT DETECTED
Opiates: NOT DETECTED
Tetrahydrocannabinol: NOT DETECTED

## 2020-03-12 LAB — TROPONIN I (HIGH SENSITIVITY)
Troponin I (High Sensitivity): 3 ng/L (ref <18)
Troponin I (High Sensitivity): 3 ng/L (ref <18)

## 2020-03-12 LAB — POTASSIUM
Potassium: 6.2 mmol/L — ABNORMAL HIGH (ref 3.5–5.1)
Potassium: 5.1 mmol/L (ref 3.5–5.1)

## 2020-03-12 IMAGING — CT CT HEAD W/O CM
4 series · 17 of 47 positions shown, 19 images · non-contrast
Comparison: CT head dated [DATE].

CLINICAL DATA: Left arm and leg weakness.

EXAM:
CT HEAD WITHOUT CONTRAST
TECHNIQUE: Contiguous axial images were obtained from the base of the skull
through the vertex without intravenous contrast.

[Series 3: head wo · axial · 0.41mm/px · z∈[-144,-34]mm · 7 of 30 slices shown, 9 images]
[im 4/30  brain]
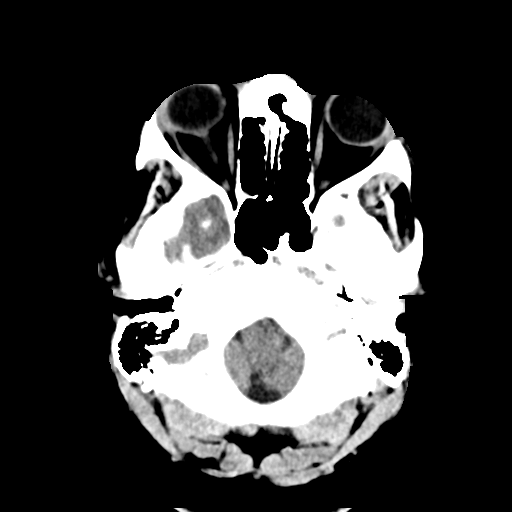
[im 4/30  bone]
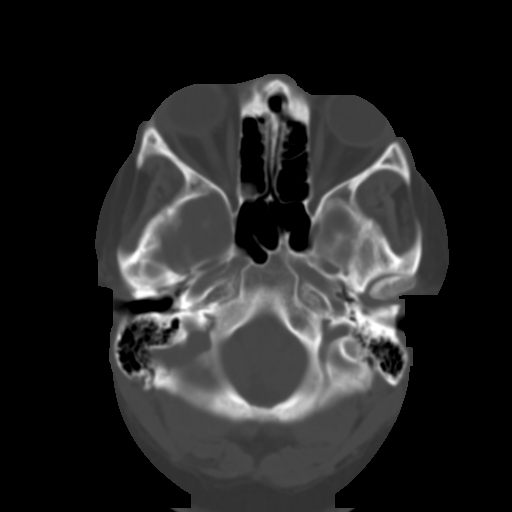
[im 8/30  brain]
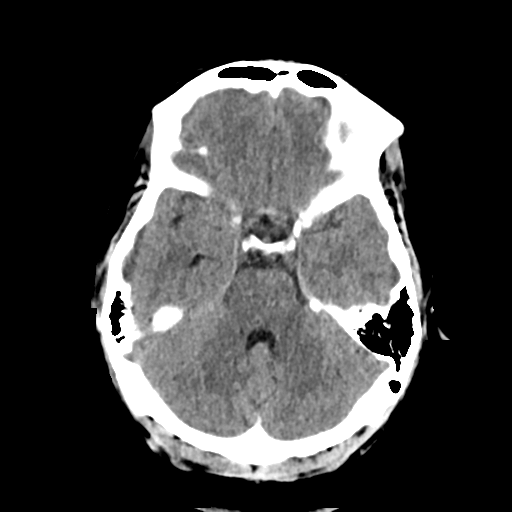
[im 11/30  brain]
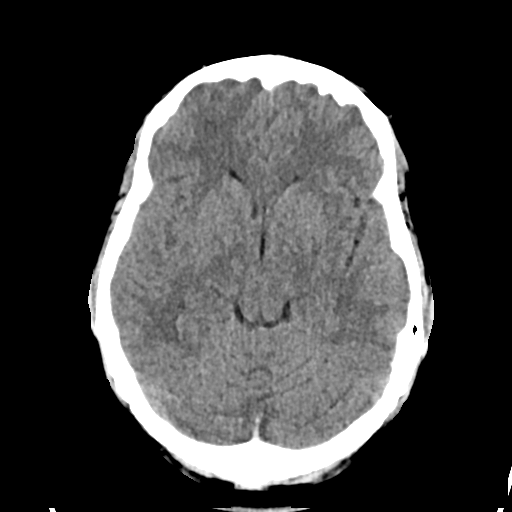
[im 15/30  brain]
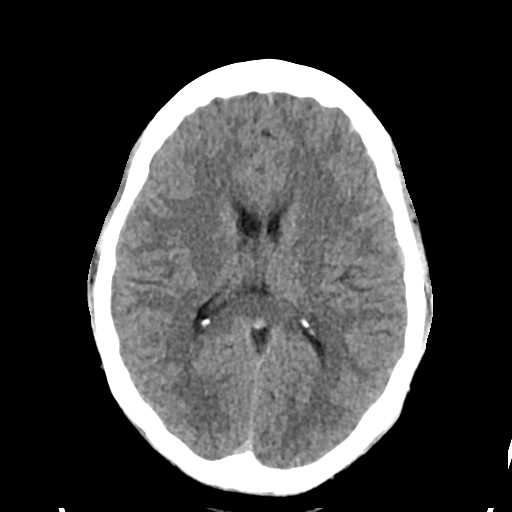
[im 19/30  brain]
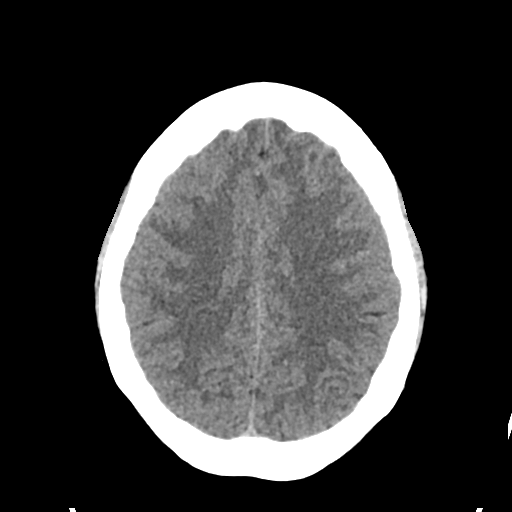
[im 19/30  bone]
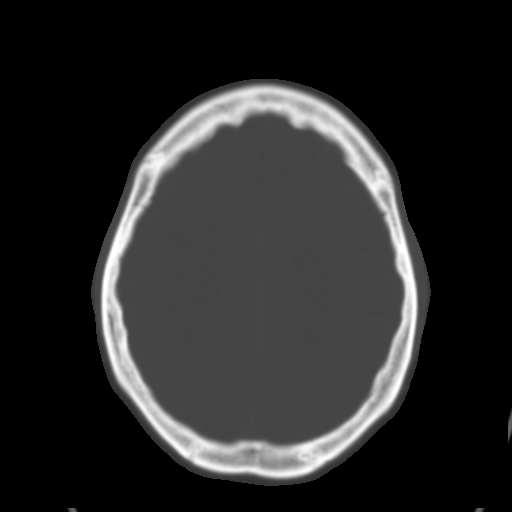
[im 22/30  brain]
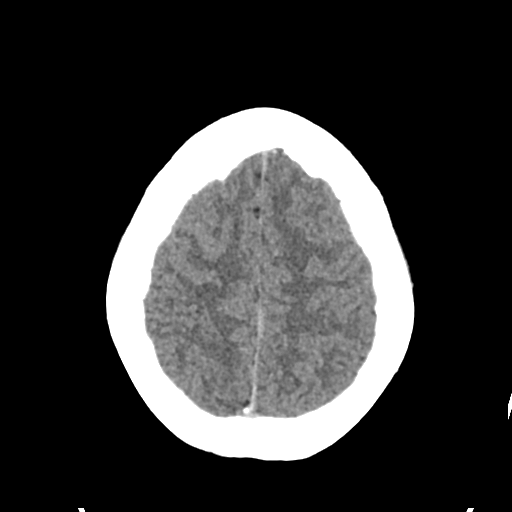
[im 26/30  brain]
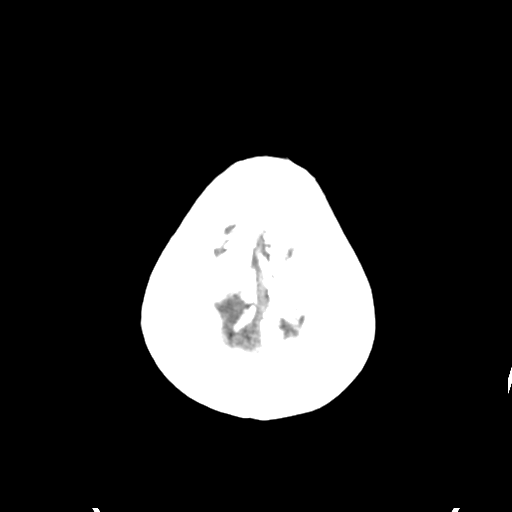

[Series 4: head bone · axial · 0.41mm/px · z∈[-144,-92]mm · 4 of 75 slices shown]
[im 8/75  bone]
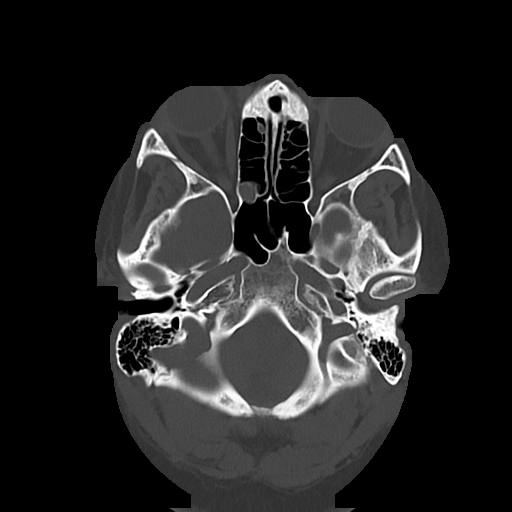
[im 15/75  bone]
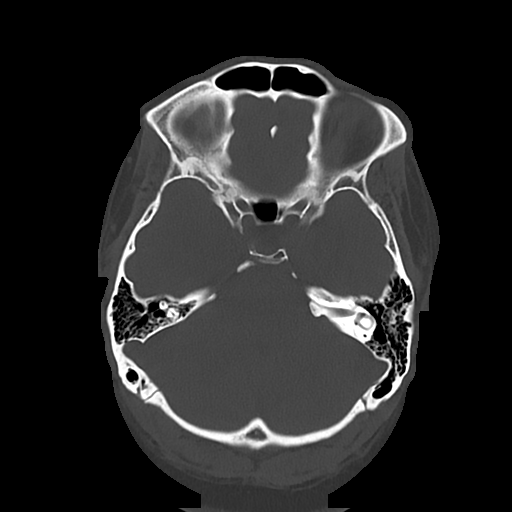
[im 23/75  bone]
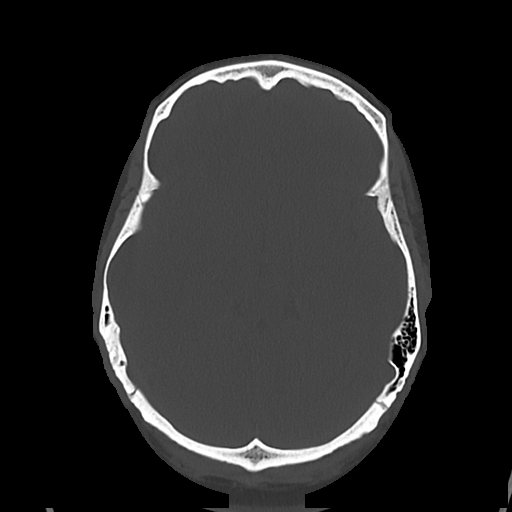
[im 34/75  bone]
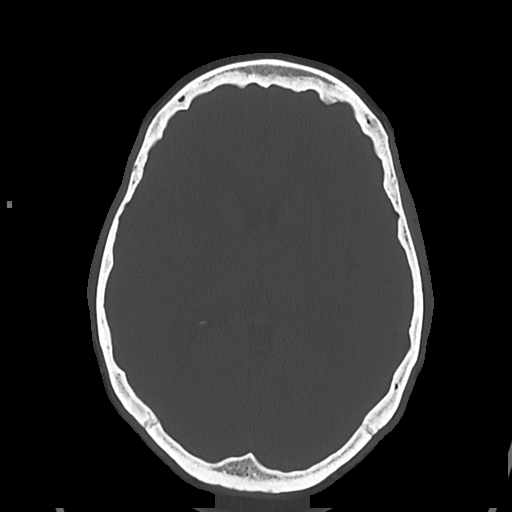

[Series 5: cor soft · coronal · 0.32mm/px · 3 of 68 slices shown]
[im 23/68  brain]
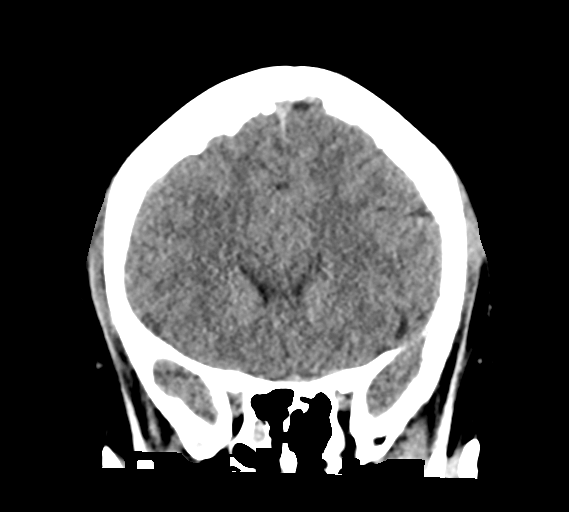
[im 30/68  brain]
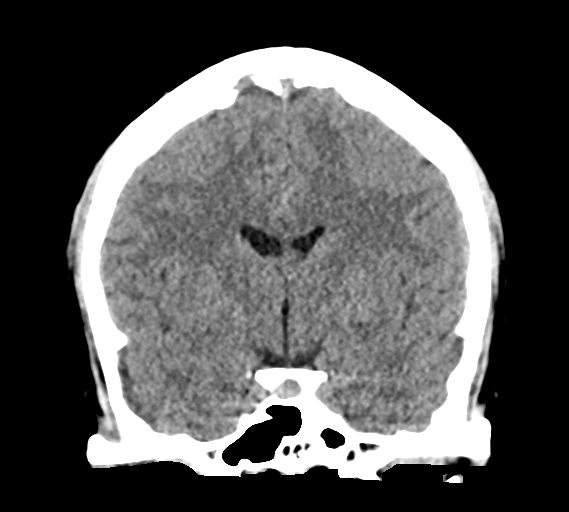
[im 38/68  brain]
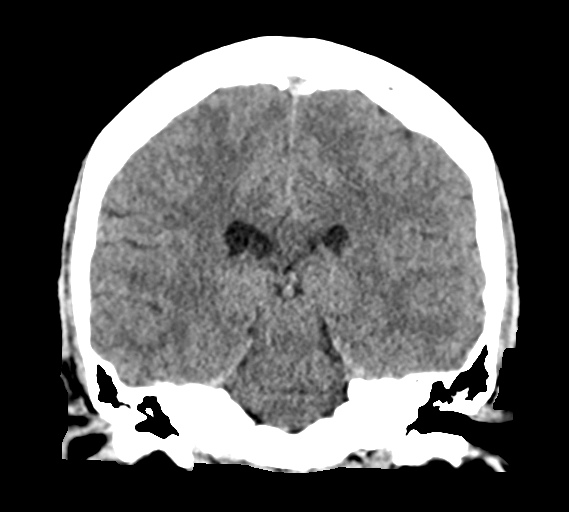

[Series 6: sag soft · sagittal · 0.32mm/px · 3 of 60 slices shown]
[im 20/60  brain]
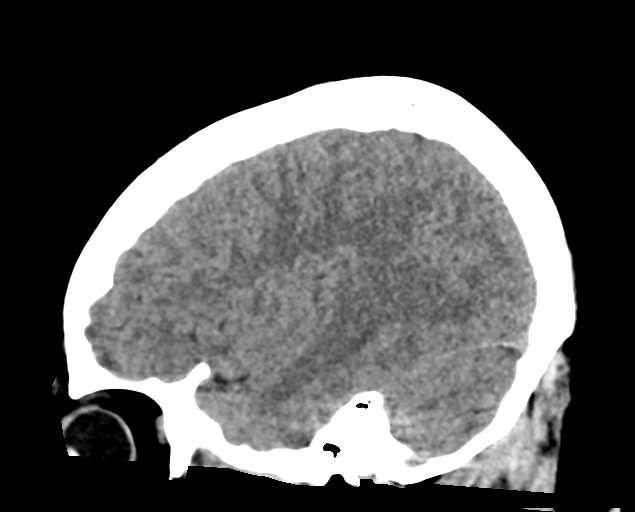
[im 30/60  brain]
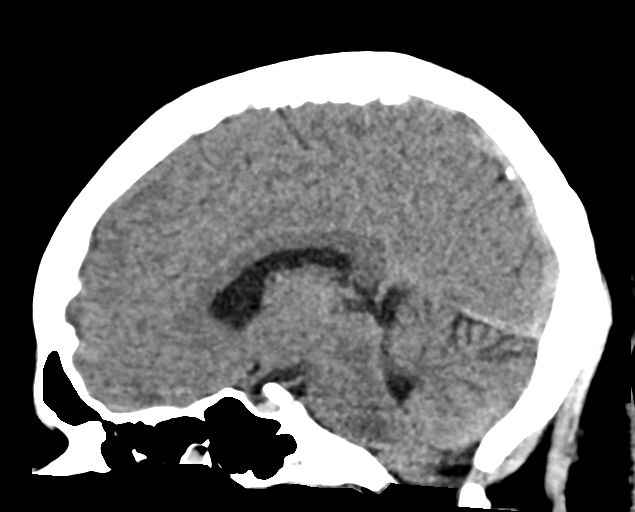
[im 40/60  brain]
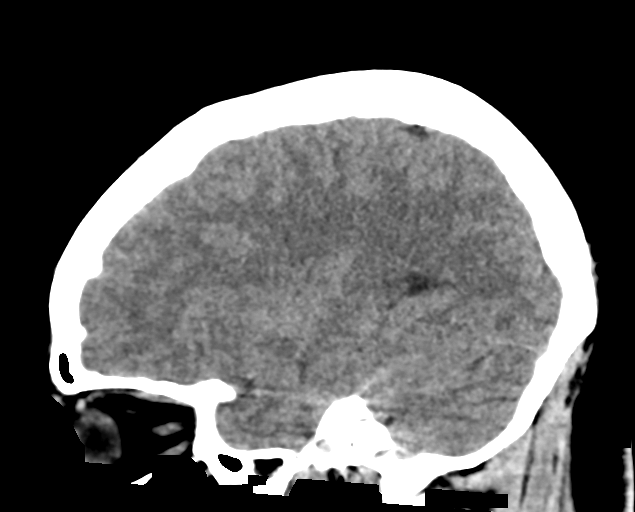

[17 of 47 positions shown; findings below may reference images not displayed]

FINDINGS: Brain: No evidence of acute infarction, hemorrhage, hydrocephalus,
extra-axial collection or mass lesion/mass effect.

Vascular: No hyperdense vessel or unexpected calcification.

Skull: Normal. Negative for fracture or focal lesion.

Sinuses/Orbits: There is right ethmoid sinus disease.

Other: None.
IMPRESSION: 1. No acute intracranial process.

## 2020-03-12 MED ORDER — INSULIN ASPART 100 UNIT/ML ~~LOC~~ SOLN
10.0000 [IU] | Freq: Once | SUBCUTANEOUS | Status: AC
  Administered 2020-03-12: 10 [IU] via SUBCUTANEOUS

## 2020-03-12 MED ORDER — LACTATED RINGERS IV BOLUS
1000.0000 mL | Freq: Once | INTRAVENOUS | Status: AC
  Administered 2020-03-12: 1000 mL via INTRAVENOUS

## 2020-03-12 NOTE — ED Provider Notes (Signed)
Transfer of Care Note I assumed care of Nancy [name] on 03/12/2020 at 1600.  Briefly, Nancy [name] is a 34 y.o. female who:  Presents with left arm weakness and numbness and hyperglycemia.  Glucose improved to 366 with fluids.  No signs of DKA or HHS.  Left upper extremity weakness is not new and has had previous CT and MRI to evaluate which have been negative.  Plan follow potassium and reevaluate patient with probable discharge.  The plan includes:  F/u labs   Please refer to the original provider's note for additional information regarding the care of Nancy [name].  Reassessment: Vital Signs:  The most current vitals were  Today's Vitals   03/12/20 1334  BP: 128/83  Pulse: (!) 104  Resp: 14  Temp: 98 F (36.7 C)  TempSrc: Oral  SpO2: 100%   There is no height or weight on file to calculate BMI.  Hemodynamics:  The patient is hemodynamically stable. Mental Status:  The patient is Alert  Additional MDM: Repeat potassium 6.2 however hemolysis was present.  Second sample sent with improvement of 5.1.  Spoke with patient regarding her labs and stated that she still felt globally weak however had improved since she had been in the emergency department and stated that she would like to go home.  Feel that this is reasonable given the patient's left upper extremity weakness is not new and has had previous imaging of her head to evaluate.  Ambulatory referral was sent for neurology and patient was given phone number for neurology clinic.  She was also encouraged to follow-up with her primary care physician as there may need to be adjustments to her diabetes medications.  Patient voiced agreement and understanding of this overall plan.  Patient was discharged in stable condition without further events.       Rickey Primus, MD 03/12/20 Garnette Scheuermann    Alvira Monday, MD 03/14/20 [phone]

## 2020-03-12 NOTE — Discharge Instructions (Signed)
  Your blood sugar was higher than normal today.  Please follow-up with your primary care provider soon as possible on this matter.  It is very important that you follow-up with neurology regarding your weakness.  A referral has been made for you.  If you have not heard from the neurology office by tomorrow afternoon, just give them a call and set up an appointment.

## 2020-03-12 NOTE — ED Provider Notes (Signed)
[facility] EMERGENCY DEPARTMENT Provider Note   CSN: [id number] Arrival date & time: 03/12/20  1229     History No chief complaint on file.   Nancy [name] is a 34 y.o. female.  HPI      Nancy [name] is a 34 y.o. female, with a history of anxiety, bipolar, DM, CHF, presenting to the ED following reported syncopal episode that occurred shortly prior to arrival. Patient states she was walking around [place], started to feel generally weak, sank to the ground.  In some accounts of the story, she states she woke up on the ground and in others she states she woke up in the ambulance.  Patient's husband, who was later available for interview, states patient did not hit her head and was able to be lowered to the ground.  He does not think she lost consciousness completely.  No seizure activity noted.  She also notes left arm weakness that she states has been present for the last several days.  She mentions lower extremity numbness bilaterally.  She denies any recent changes in her medications. Denies use of alcohol, tobacco, or illicit drugs.  She denies fever/chills, chest pain, shortness of breath, neck/back pain, urinary symptoms, changes in bowel or bladder function, headaches, dizziness, N/V/D, or any other complaints.   Past Medical History:  Diagnosis Date  . Anxiety   . Bipolar disorder (HCC)   . CHF (congestive heart failure) (HCC)   . CIN I (cervical intraepithelial neoplasia I)   . Diabetes mellitus without complication (HCC)   . Enlarged heart   . Nephrolithiasis   . OSA (obstructive sleep apnea)   . Tachycardia     Patient Active Problem List   Diagnosis Date Noted  . Bipolar affective disorder, depressed, severe (HCC) 08/27/2016  . Opiate abuse, episodic (HCC) 04/09/2015  . MDD (major depressive disorder), recurrent episode, severe (HCC) 04/09/2015  . Suicide attempt by other tranquilizer drug overdose (HCC) 04/09/2015  . 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 Dysmenorrhea 07/06/2013  . Dysmenorrhea 06/09/2013  . Menorrhagia with regular cycle 06/09/2013  . Leg edema 10/03/2011  . Edema leg 10/03/2011  . Bipolar I disorder, most recent episode depressed (HCC) 09/26/2011  . Morbid obesity (HCC) 09/26/2011  . Weakness of both legs 09/04/2011  . Difficulty in walking(719.7) 09/04/2011  . 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 BLADDER AUGMENTATION  2001   enlargement  . DILITATION & CURRETTAGE/HYSTROSCOPY WITH THERMACHOICE ABLATION N/A 07/06/2013   Procedure: DILATATION & CURETTAGE/HYSTEROSCOPY WITH THERMACHOICE ABLATION;  Surgeon: Tilda Burrow, MD;  Location: [facility];  Service: Gynecology;  Laterality: N/A;  total therapt time- 9 minutes 2 seconds; 87C  . LAPAROSCOPIC BILATERAL SALPINGECTOMY Bilateral 07/06/2013   Procedure: LAPAROSCOPIC BILATERAL SALPINGECTOMY;  Surgeon: Tilda Burrow, MD;  Location: [facility];  Service: Gynecology;  Laterality: Bilateral;     OB History   No obstetric history on file.     Family History  Problem Relation Age of Onset  . Hypertension Mother   . Depression Mother   . Diabetes Mother   . Cancer Mother        brain tumor  . Hypertension Father   . Diabetes Father   . Depression Father   . Hypertension Sister   . Diabetes Sister   . Depression Sister   . Depression Brother   . Diabetes Brother   . Hypertension Brother   .  Cancer Maternal Aunt   . Cancer Paternal Aunt   . Hypertension Maternal Grandmother   . Diabetes Maternal Grandmother   . Depression Maternal Grandmother   . Hypertension Maternal Grandfather   . Diabetes Maternal Grandfather   . Depression Maternal Grandfather   . Hypertension Paternal Grandmother   . Diabetes Paternal Grandmother   . Depression Paternal Grandmother   . Hypertension Paternal Grandfather   . Diabetes Paternal Grandfather   . Depression Paternal Grandfather      Social History   Tobacco Use  . Smoking status: Current Every Day Smoker    Packs/day: 1.50    Types: Cigarettes  . Smokeless tobacco: Never Used  Vaping Use  . Vaping Use: Never used  Substance Use Topics  . Alcohol use: Yes    Comment: occasionally  . Drug use: No    Home Medications Prior to Admission medications   Medication Sig Start Date End Date Taking? Authorizing Provider  chlorthalidone (HYGROTON) 25 MG tablet Take 25 mg by mouth in the morning and at bedtime. 11/19/19  Yes [provider]  cyclobenzaprine (FLEXERIL) 10 MG tablet Take 10 mg by mouth 3 (three) times daily as needed for muscle spasms.   Yes [provider]  EPINEPHrine 0.3 mg/0.3 mL IJ SOAJ injection Inject 0.3 mg into the muscle as needed for anaphylaxis.    Yes [provider]  etodolac (LODINE XL) 600 MG 24 hr tablet Take 600 mg by mouth daily. 02/23/20  Yes [provider]  furosemide (LASIX) 40 MG tablet Take 40 mg by mouth daily. 02/22/20  Yes [provider]  glipiZIDE (GLUCOTROL) 5 MG tablet Take 5 mg by mouth daily. 12/01/19  Yes [provider]  insulin glargine (LANTUS) 100 UNIT/ML injection Inject 0.1 mLs (10 Units total) into the skin at bedtime. Patient taking differently: Inject 40 Units into the skin at bedtime.  08/30/16  Yes Adonis BrookAgustin, Sheila, NP  lisinopril (ZESTRIL) 2.5 MG tablet Take 2.5 mg by mouth daily. 12/01/19  Yes [provider]  oxyCODONE (ROXICODONE) 15 MG immediate release tablet Take 15 mg by mouth every 6 (six) hours as needed for pain. 02/25/20  Yes [provider]  potassium chloride (KLOR-CON) 10 MEQ tablet Take 10 mEq by mouth daily.   Yes [provider]  pregabalin (LYRICA) 300 MG capsule Take 300 mg by mouth 2 (two) times daily. 02/24/20  Yes [provider]  topiramate (TOPAMAX) 100 MG tablet Take 100 mg by mouth daily. 02/22/20  Yes [provider]    Allergies    Asa buff  (mag [buffered aspirin], Aspirin, Tolmetin, and Prozac [fluoxetine hcl]  Review of Systems   Review of Systems  Constitutional: Negative for chills, diaphoresis and fever.  HENT: Negative for trouble swallowing and voice change.   Respiratory: Negative for cough and shortness of breath.   Cardiovascular: Negative for chest pain and leg swelling.  Gastrointestinal: Negative for abdominal pain, diarrhea, nausea and vomiting.  Genitourinary: Negative for difficulty urinating and dysuria.  Musculoskeletal: Negative for back pain and neck pain.  Neurological: Positive for weakness and numbness. Negative for dizziness and headaches.  All other systems reviewed and are negative.   Physical Exam Updated Vital Signs BP 128/83 (BP Location: Right Arm)   Pulse (!) 104   Temp 98 F (36.7 C) (Oral)   Resp 14   SpO2 100%   Physical Exam Vitals and nursing note reviewed.  Constitutional:      General: She is not  in acute distress.    Appearance: She is well-developed. She is obese. She is not diaphoretic.  HENT:     Head: Normocephalic and atraumatic.     Mouth/Throat:     Mouth: Mucous membranes are moist.     Pharynx: Oropharynx is clear.  Eyes:     Conjunctiva/sclera: Conjunctivae normal.  Cardiovascular:     Rate and Rhythm: Normal rate and regular rhythm.     Pulses: Normal pulses.          Radial pulses are 2+ on the right side and 2+ on the left side.       Posterior tibial pulses are 2+ on the right side and 2+ on the left side.     Heart sounds: Normal heart sounds.     Comments: Tactile temperature in the extremities appropriate and equal bilaterally. Pulmonary:     Effort: Pulmonary effort is normal. No respiratory distress.     Breath sounds: Normal breath sounds.  Abdominal:     Palpations: Abdomen is soft.     Tenderness: There is no abdominal tenderness. There is no guarding.  Musculoskeletal:     Cervical back: Neck supple.     Right lower leg: No edema.     Left  lower leg: No edema.     Comments: No apparent injuries upon examination of the patient's extremities, neck, back.  No midline spinal tenderness.   Lymphadenopathy:     Cervical: No cervical adenopathy.  Skin:    General: Skin is warm and dry.  Neurological:     Mental Status: She is alert and oriented to person, place, and time.     Comments: No noted acute cognitive deficit. Sensation grossly intact to light touch in the right upper and lower extremity without deficit.  Patient endorses decreased sensation to both pain and light touch mostly in the left hand and fingers. Grip strength weaker on the left. Strength 5/5 in the right upper and lower extremity. Strength testing in the left upper extremity revealed some interesting results. For instance, she stated she had difficulty with raising her arm out in front of her, but when I lifted her arm and put it in position for her and asked her to hold it in place, she was able to hold the arm in the air, just stating that it felt heavy. She is also able to flex and extend at the elbow, though this is weaker than on the right. Of all the actions, she seems to have the most trouble with wrist extension, only able to demonstrate 2/5 strength. Strength 4/5 at the left hip, knee, and ankle. Coordination intact.  No upright ataxia while sitting in the bed. Cranial nerves III-XII grossly intact.  Handles oral secretions without noted difficulty.  No noted phonation or speech deficit. No facial droop.   Psychiatric:        Mood and Affect: Mood and affect normal.        Speech: Speech normal.        Behavior: Behavior normal.     ED Results / Procedures / Treatments   Labs (all labs ordered are listed, but only abnormal results are displayed) Labs Reviewed  COMPREHENSIVE METABOLIC PANEL - Abnormal; Notable for the following components:      Result Value   Sodium 130 (*)    Potassium 5.6 (*)    Chloride 96 (*)    Glucose, Bld 511 (*)     Albumin 3.3 (*)  Total Bilirubin 1.4 (*)    All other components within normal limits  URINALYSIS, ROUTINE W REFLEX MICROSCOPIC - Abnormal; Notable for the following components:   Color, Urine STRAW (*)    Specific Gravity, Urine 1.032 (*)    Glucose, UA >=500 (*)    Hgb urine dipstick SMALL (*)    Leukocytes,Ua TRACE (*)    All other components within normal limits  CBG MONITORING, ED - Abnormal; Notable for the following components:   Glucose-Capillary 449 (*)    All other components within normal limits  I-STAT VENOUS BLOOD GAS, ED - Abnormal; Notable for the following components:   pH, Ven 7.454 (*)    pCO2, Ven 40.7 (*)    pO2, Ven 97.0 (*)    Bicarbonate 28.5 (*)    Acid-Base Excess 4.0 (*)    Sodium 132 (*)    Calcium, Ion 1.04 (*)    All other components within normal limits  CBG MONITORING, ED - Abnormal; Notable for the following components:   Glucose-Capillary 366 (*)    All other components within normal limits  ETHANOL  CBC WITH DIFFERENTIAL/PLATELET  RAPID URINE DRUG SCREEN, HOSP PERFORMED  I-STAT BETA HCG BLOOD, ED ([facility], [facility], [facility] ONLY)  TROPONIN I (HIGH SENSITIVITY)  TROPONIN I (HIGH SENSITIVITY)    EKG EKG Interpretation  Date/Time:  Sunday March 12 2020 12:49:53 EDT Ventricular Rate:  104 PR Interval:  114 QRS Duration: 86 QT Interval:  340 QTC Calculation: 447 R Axis:   42 Text Interpretation: ** Poor data quality, interpretation may be adversely affected Sinus tachycardia Nonspecific T wave abnormality Confirmed by Gwyneth Sprout ([id number]) on 03/12/2020 1:02:26 PM   Radiology CT Head Wo Contrast  Result Date: 03/12/2020 CLINICAL DATA:  Left arm and leg weakness. EXAM: CT HEAD WITHOUT CONTRAST TECHNIQUE: Contiguous axial images were obtained from the base of the skull through the vertex without intravenous contrast. COMPARISON:  CT head dated 04/08/2015. FINDINGS: Brain: No evidence of acute infarction, hemorrhage, hydrocephalus, extra-axial  collection or mass lesion/mass effect. Vascular: No hyperdense vessel or unexpected calcification. Skull: Normal. Negative for fracture or focal lesion. Sinuses/Orbits: There is right ethmoid sinus disease. Other: None. IMPRESSION: 1. No acute intracranial process. Electronically Signed   By: Romona Curls M.D.   On: 03/12/2020 14:07    Procedures Procedures (including critical care time)  Medications Ordered in ED Medications  lactated ringers bolus 1,000 mL (0 mLs Intravenous Stopped 03/12/20 1545)  insulin aspart (novoLOG) injection 10 Units (10 Units Subcutaneous Given 03/12/20 1510)    ED Course  I have reviewed the triage vital signs and the nursing notes.  Pertinent labs & imaging results that were available during my care of the patient were reviewed by me and considered in my medical decision making (see chart for details).    MDM Rules/Calculators/A&P                          Patient presents following a reported syncopal episode. Patient is nontoxic appearing, afebrile, not tachycardic on my exam, not tachypneic, not hypotensive, maintains excellent SPO2 on room air, and is in no apparent distress.   I have reviewed the patient's chart to obtain more information.  Patient presented to a [facility] ED February 28, 2020 with a complaint of acute left-sided weakness and numbness.  Noncontrast CT, CTA of the head/neck, and MRI brain were all unremarkable.  When I asked the patient about this visit and how her symptoms  then compare with her current symptoms of left-sided weakness, she states during the previous visit she was only experiencing left arm numbness and so therefore her symptoms today are worse than they were during the previous visit.  However, it should be noted that the exam reported during her August 2 visit indicated left-sided weakness and is actually remarkably similar to my exam today.  We discussed referral to neurology.  She requested referral to [facility].  I  reviewed and interpreted the patient's labs and radiological studies. She is hyperglycemic, but with no elevated anion gap or signs of acidosis.  No DKA. Potassium 5.6 was reported, however, possible hemolysis.  Retest of potassium ordered.  Findings and plan of care discussed with Gwyneth Sprout, MD.   End of shift patient care handoff report given to Rickey Primus, EM resident. Plan: Repeat EKG.  Review repeat potassium.  Assure improvement in patient's blood sugar. Ambulate patient.   Final Clinical Impression(s) / ED Diagnoses Final diagnoses:  None    Rx / DC Orders ED Discharge Orders    None       Concepcion Living 03/12/20 1636    Gwyneth Sprout, MD 03/13/20 1140

## 2020-03-12 NOTE — ED Triage Notes (Addendum)
Pt BIB [facility] from [facility]. Pt reported sudden weakness while shopping. Pt's husband lowered pt to ground, no trauma noted during near syncope. Pt CBG 554, did take her insulin today. Hx of seizures. Numbness in L arm for several days.  HR 100 ST BP 120/80 98% RA RR16

## 2020-03-12 NOTE — ED Notes (Signed)
Patient verbalizes understanding of discharge instructions. Opportunity for questioning and answers were provided. Armband removed by staff, pt discharged from ED ambulatory.   

## 2020-03-12 NOTE — ED Notes (Signed)
Patient transported to CT 

## 2020-03-12 NOTE — ED Notes (Signed)
Date and time results received: 03/12/20 2:15 PM  Test: glucose Critical Value: 511  Primary RN notified

## 2020-03-31 ENCOUNTER — Other Ambulatory Visit: Payer: Self-pay

## 2020-03-31 ENCOUNTER — Encounter (HOSPITAL_COMMUNITY): Payer: Self-pay

## 2020-03-31 ENCOUNTER — Emergency Department (HOSPITAL_COMMUNITY)
Admission: EM | Admit: 2020-03-31 | Discharge: 2020-03-31 | Disposition: A | Discharge status: Home / Self Care | Source: Home / Self Care

## 2020-03-31 DIAGNOSIS — E1165 Type 2 diabetes mellitus with hyperglycemia: Secondary | ICD-10-CM | POA: Diagnosis not present

## 2020-03-31 DIAGNOSIS — R531 Weakness: Secondary | ICD-10-CM | POA: Insufficient documentation

## 2020-03-31 DIAGNOSIS — E871 Hypo-osmolality and hyponatremia: Secondary | ICD-10-CM | POA: Diagnosis not present

## 2020-03-31 DIAGNOSIS — I110 Hypertensive heart disease with heart failure: Secondary | ICD-10-CM | POA: Diagnosis not present

## 2020-03-31 DIAGNOSIS — F17210 Nicotine dependence, cigarettes, uncomplicated: Secondary | ICD-10-CM | POA: Diagnosis not present

## 2020-03-31 DIAGNOSIS — Z79899 Other long term (current) drug therapy: Secondary | ICD-10-CM | POA: Diagnosis not present

## 2020-03-31 DIAGNOSIS — N3941 Urge incontinence: Secondary | ICD-10-CM | POA: Diagnosis not present

## 2020-03-31 DIAGNOSIS — Z833 Family history of diabetes mellitus: Secondary | ICD-10-CM | POA: Diagnosis not present

## 2020-03-31 DIAGNOSIS — Z79891 Long term (current) use of opiate analgesic: Secondary | ICD-10-CM | POA: Diagnosis not present

## 2020-03-31 DIAGNOSIS — I5032 Chronic diastolic (congestive) heart failure: Secondary | ICD-10-CM | POA: Diagnosis not present

## 2020-03-31 DIAGNOSIS — Z8249 Family history of ischemic heart disease and other diseases of the circulatory system: Secondary | ICD-10-CM | POA: Diagnosis not present

## 2020-03-31 DIAGNOSIS — Z5321 Procedure and treatment not carried out due to patient leaving prior to being seen by health care provider: Secondary | ICD-10-CM | POA: Insufficient documentation

## 2020-03-31 DIAGNOSIS — F319 Bipolar disorder, unspecified: Secondary | ICD-10-CM | POA: Diagnosis not present

## 2020-03-31 DIAGNOSIS — G894 Chronic pain syndrome: Secondary | ICD-10-CM | POA: Diagnosis not present

## 2020-03-31 DIAGNOSIS — Z20822 Contact with and (suspected) exposure to covid-19: Secondary | ICD-10-CM | POA: Diagnosis not present

## 2020-03-31 DIAGNOSIS — Z794 Long term (current) use of insulin: Secondary | ICD-10-CM | POA: Diagnosis not present

## 2020-03-31 DIAGNOSIS — Z6841 Body Mass Index (BMI) 40.0 and over, adult: Secondary | ICD-10-CM | POA: Diagnosis not present

## 2020-03-31 DIAGNOSIS — G610 Guillain-Barre syndrome: Secondary | ICD-10-CM | POA: Diagnosis not present

## 2020-03-31 DIAGNOSIS — E1142 Type 2 diabetes mellitus with diabetic polyneuropathy: Secondary | ICD-10-CM | POA: Diagnosis not present

## 2020-03-31 MED ORDER — SODIUM CHLORIDE 0.9% FLUSH
3.0000 mL | Freq: Once | INTRAVENOUS | Status: DC
Start: 2020-03-31 — End: 2020-04-01

## 2020-03-31 NOTE — ED Triage Notes (Signed)
Pt refusing to stay, refusing to have blood drawn d/t long wait. Pt will stay if she can get back sooner.  No neuro deficits noted in triage.

## 2020-03-31 NOTE — ED Triage Notes (Signed)
Pt c/o BLE pain with the weakness and numbness

## 2020-03-31 NOTE — ED Triage Notes (Signed)
Pt reports her PCP sent her here for new onset BLE numbness and weakness starting today, Left leg weakness yesterday, Left arm weakness for several months. PCP requesting an MRI

## 2020-03-31 NOTE — ED Notes (Signed)
Pt informed registration that her doctor called and was arranged care for her and that she was leaving.

## 2020-03-31 NOTE — H&P (Signed)
CC: left arm weakness, leg weakness  HPI:     This is a 34 year old woman with obesity and history of smoking who presents for evaluation of neck pain, left arm weakness and leg weakness. One month ago, she woke up with severe weakness in her left arm as well as some accompanying neck pain. She went to [facility] for a stroke workup and her MRI brain was negative for stroke. Her symptoms continued at home, but a few days ago, her leg started going numb with weakness in her left leg more than her right to the point where she could no longer walk. She has the feeling that her legs are asleep. She has also had urgent incontinence that she feels has been worse the last week or so. No fevers, chills, weight loss or weight gain recently. No history of neurologic disorders in her family. No vision changes.    Patient Active Problem List   Diagnosis Date Noted  . Bipolar affective disorder, depressed, severe (HCC) 08/27/2016  . Opiate abuse, episodic (HCC) 04/09/2015  . MDD (major depressive disorder), recurrent episode, severe (HCC) 04/09/2015  . Suicide attempt by other tranquilizer drug overdose (HCC) 04/09/2015  . 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 Dysmenorrhea 07/06/2013  . Dysmenorrhea 06/09/2013  . Menorrhagia with regular cycle 06/09/2013  . Leg edema 10/03/2011  . Edema leg 10/03/2011  . Bipolar I disorder, most recent episode depressed (HCC) 09/26/2011  . Morbid obesity (HCC) 09/26/2011  . Weakness of both legs 09/04/2011  . Difficulty in walking(719.7) 09/04/2011  . 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 Anxiety   . Bipolar disorder (HCC)   . CHF (congestive heart failure) (HCC)   . CIN I (cervical intraepithelial neoplasia I)   . Diabetes mellitus without complication (HCC)   . Enlarged heart   . Nephrolithiasis   . OSA (obstructive sleep apnea)   . Tachycardia     Past Surgical History:   Procedure Laterality Date  . BLADDER AUGMENTATION  2001   enlargement  . DILITATION & CURRETTAGE/HYSTROSCOPY WITH THERMACHOICE ABLATION N/A 07/06/2013   Procedure: DILATATION & CURETTAGE/HYSTEROSCOPY WITH THERMACHOICE ABLATION;  Surgeon: Tilda Burrow, MD;  Location: [facility];  Service: Gynecology;  Laterality: N/A;  total therapt time- 9 minutes 2 seconds; 87C  . LAPAROSCOPIC BILATERAL SALPINGECTOMY Bilateral 07/06/2013   Procedure: LAPAROSCOPIC BILATERAL SALPINGECTOMY;  Surgeon: Tilda Burrow, MD;  Location: [facility];  Service: Gynecology;  Laterality: Bilateral;    No medications prior to admission.   Allergies  Allergen Reactions  . Asa Buff (Mag [Buffered Aspirin] Shortness Of Breath  . Aspirin Anaphylaxis and Shortness Of Breath  . Tolmetin Shortness Of Breath  . Prozac [Fluoxetine Hcl]     Social History   Tobacco Use  . Smoking status: Current Every Day Smoker    Packs/day: 1.50    Types: Cigarettes  . Smokeless tobacco: Never Used  Substance Use Topics  . Alcohol use: Yes    Comment: occasionally    Family History  Problem Relation Age of Onset  . Hypertension Mother   . Depression Mother   . Diabetes Mother   . Cancer Mother        brain tumor  . Hypertension Father   . Diabetes Father   . Depression Father   . Hypertension Sister   . Diabetes Sister   . Depression Sister   . Depression Brother   . Diabetes  Brother   . Hypertension Brother   . Cancer Maternal Aunt   . Cancer Paternal Aunt   . Hypertension Maternal Grandmother   . Diabetes Maternal Grandmother   . Depression Maternal Grandmother   . Hypertension Maternal Grandfather   . Diabetes Maternal Grandfather   . Depression Maternal Grandfather   . Hypertension Paternal Grandmother   . Diabetes Paternal Grandmother   . Depression Paternal Grandmother   . Hypertension Paternal Grandfather   . Diabetes Paternal Grandfather   . Depression Paternal Grandfather        Objective:   No data  found. No intake/output data recorded. No intake/output data recorded.  BP 130/86 Pulse 99 Ht In 67 Left Wt Lb BMI BSA Pain Score 7/10  PHYSICAL EXAM Details General Level of Distress: Overall Appearance: Head and Face Fundoscopic Exam: Cardiovascular Peripheral Pulses: Carotid Pulses: Respiratory Lungs: Musculoskeletal Gait and Station: Upper Extremity Muscle Lower Extremity Muscle no acute distress Normal Right unable to visualize Right normal non-labored normal unable to visualize   Motor Strength . Any abnormal findings will be noted below. Deltoid: Biceps: Triceps: Wrist Extensor: Grip: Finger Extensor: Hip Flexor: Knee Extensor: Knee Flexor: Tib Anterior: EHL: Medial Gastroc: Gaze Normal Right Left 5/5 3/5 5/5 3/5 5/5 2/5 5/5 0/5 5/5 1/5 5/5 1/5 4/5 2/5 4/5 2/5 4/5 2/5 4/5 3/5 4/5 3/5 4/5 3/5 normal Sensation was tested at C2 to T1 and L1 to S1. Horizontal Gaze Stability: Sensory Cranial Nerves II. Optic Nerve/Visual Fields: III. Oculomotor: IV. Trochlear: V. Trigeminal: normal EOMI EOMI sensory intact EOMI no facial droop hearing intact palate elevation symmetric no hoarseness shoulder shrug full tongue protrusion midline Left absent negative VI. Abducens: VII. Facial: VIII. Acoustic/Vestibular: IX. Glossopharyngeal: X. Vagus: XI. Spinal Accessory: XII. Hypoglossal: Motor and other Tests Hoffman's: SLR: Additional Findings: Right absent negative 3+ biceps, brachioradialis reflex on left, 2+ on the right. 1+ patellar and Achilles bilaterally. No clonus or Hoffman's    Assessment:  This is a 34 year old woman with severe left arm weakness over the past month and recent leg weakness worse on the left. She has crossed pain/temperature sensation loss on the right. The primary concern is cervical myelopathy from transverse myelitis or spondylitic myelopathy, with a neuromuscular disorder also being possible. PLAN: For purposes of  expediency of workup, I am direct admitting her so that she can obtain MRI of her cervical and thoracic spine and further neurologic workup on an urgent basis. I explained this in detail to the patient and she verbalized understanding and agreement with the plan. I have notified my colleague who is on

## 2020-04-01 ENCOUNTER — Observation Stay (HOSPITAL_COMMUNITY)

## 2020-04-01 ENCOUNTER — Other Ambulatory Visit: Payer: Self-pay

## 2020-04-01 ENCOUNTER — Inpatient Hospital Stay (HOSPITAL_COMMUNITY)
Admission: AD | Admit: 2020-04-01 | Discharge: 2020-04-12 | DRG: 095 | Disposition: A | Discharge status: Rehabilitation Facility | Source: Ambulatory Visit | Attending: Internal Medicine | Admitting: Internal Medicine

## 2020-04-01 ENCOUNTER — Encounter (HOSPITAL_COMMUNITY): Payer: Self-pay | Admitting: Emergency Medicine

## 2020-04-01 ENCOUNTER — Emergency Department (HOSPITAL_COMMUNITY)
Admission: EM | Admit: 2020-04-01 | Discharge: 2020-04-01 | Disposition: A | Discharge status: Home / Self Care | Source: Home / Self Care

## 2020-04-01 DIAGNOSIS — I10 Essential (primary) hypertension: Secondary | ICD-10-CM | POA: Diagnosis present

## 2020-04-01 DIAGNOSIS — E1165 Type 2 diabetes mellitus with hyperglycemia: Secondary | ICD-10-CM | POA: Diagnosis present

## 2020-04-01 DIAGNOSIS — Z8249 Family history of ischemic heart disease and other diseases of the circulatory system: Secondary | ICD-10-CM

## 2020-04-01 DIAGNOSIS — F3130 Bipolar disorder, current episode depressed, mild or moderate severity, unspecified: Secondary | ICD-10-CM | POA: Diagnosis present

## 2020-04-01 DIAGNOSIS — Z79891 Long term (current) use of opiate analgesic: Secondary | ICD-10-CM

## 2020-04-01 DIAGNOSIS — F319 Bipolar disorder, unspecified: Secondary | ICD-10-CM | POA: Diagnosis present

## 2020-04-01 DIAGNOSIS — I509 Heart failure, unspecified: Secondary | ICD-10-CM

## 2020-04-01 DIAGNOSIS — N3941 Urge incontinence: Secondary | ICD-10-CM | POA: Diagnosis present

## 2020-04-01 DIAGNOSIS — I110 Hypertensive heart disease with heart failure: Secondary | ICD-10-CM | POA: Diagnosis present

## 2020-04-01 DIAGNOSIS — Z833 Family history of diabetes mellitus: Secondary | ICD-10-CM

## 2020-04-01 DIAGNOSIS — F17210 Nicotine dependence, cigarettes, uncomplicated: Secondary | ICD-10-CM | POA: Diagnosis present

## 2020-04-01 DIAGNOSIS — E119 Type 2 diabetes mellitus without complications: Secondary | ICD-10-CM

## 2020-04-01 DIAGNOSIS — G610 Guillain-Barre syndrome: Principal | ICD-10-CM | POA: Diagnosis present

## 2020-04-01 DIAGNOSIS — E1142 Type 2 diabetes mellitus with diabetic polyneuropathy: Secondary | ICD-10-CM | POA: Diagnosis present

## 2020-04-01 DIAGNOSIS — I5032 Chronic diastolic (congestive) heart failure: Secondary | ICD-10-CM | POA: Diagnosis present

## 2020-04-01 DIAGNOSIS — Z79899 Other long term (current) drug therapy: Secondary | ICD-10-CM

## 2020-04-01 DIAGNOSIS — R29898 Other symptoms and signs involving the musculoskeletal system: Secondary | ICD-10-CM

## 2020-04-01 DIAGNOSIS — Z794 Long term (current) use of insulin: Secondary | ICD-10-CM

## 2020-04-01 DIAGNOSIS — E871 Hypo-osmolality and hyponatremia: Secondary | ICD-10-CM | POA: Diagnosis present

## 2020-04-01 DIAGNOSIS — G8220 Paraplegia, unspecified: Secondary | ICD-10-CM

## 2020-04-01 DIAGNOSIS — Z6841 Body Mass Index (BMI) 40.0 and over, adult: Secondary | ICD-10-CM

## 2020-04-01 DIAGNOSIS — R531 Weakness: Secondary | ICD-10-CM

## 2020-04-01 DIAGNOSIS — Z5321 Procedure and treatment not carried out due to patient leaving prior to being seen by health care provider: Secondary | ICD-10-CM | POA: Insufficient documentation

## 2020-04-01 DIAGNOSIS — Z20822 Contact with and (suspected) exposure to covid-19: Secondary | ICD-10-CM | POA: Diagnosis present

## 2020-04-01 DIAGNOSIS — M6281 Muscle weakness (generalized): Secondary | ICD-10-CM | POA: Insufficient documentation

## 2020-04-01 DIAGNOSIS — G894 Chronic pain syndrome: Secondary | ICD-10-CM | POA: Diagnosis present

## 2020-04-01 LAB — CBC
HCT: 42.1 % (ref 36.0–46.0)
Hemoglobin: 13.5 g/dL (ref 12.0–15.0)
MCH: 28.2 pg (ref 26.0–34.0)
MCHC: 32.1 g/dL (ref 30.0–36.0)
MCV: 88.1 fL (ref 80.0–100.0)
Platelets: 225 10*3/uL (ref 150–400)
RBC: 4.78 MIL/uL (ref 3.87–5.11)
RDW: 12.4 % (ref 11.5–15.5)
WBC: 6.9 10*3/uL (ref 4.0–10.5)
nRBC: 0 % (ref 0.0–0.2)

## 2020-04-01 LAB — BASIC METABOLIC PANEL
Anion gap: 10 (ref 5–15)
BUN: 10 mg/dL (ref 6–20)
CO2: 25 mmol/L (ref 22–32)
Calcium: 9.3 mg/dL (ref 8.9–10.3)
Chloride: 99 mmol/L (ref 98–111)
Creatinine, Ser: 0.67 mg/dL (ref 0.44–1.00)
GFR calc Af Amer: >60 mL/min (ref >60)
GFR calc non Af Amer: >60 mL/min (ref >60)
Glucose, Bld: 372 mg/dL — ABNORMAL HIGH (ref 70–99)
Potassium: 4.3 mmol/L (ref 3.5–5.1)
Sodium: 134 mmol/L — ABNORMAL LOW (ref 135–145)

## 2020-04-01 LAB — GLUCOSE, CAPILLARY
Glucose-Capillary: 407 mg/dL — ABNORMAL HIGH (ref 70–99)
Glucose-Capillary: 353 mg/dL — ABNORMAL HIGH (ref 70–99)

## 2020-04-01 LAB — URINALYSIS, ROUTINE W REFLEX MICROSCOPIC
Bacteria, UA: NONE SEEN
Bilirubin Urine: NEGATIVE
Glucose, UA: >=500 mg/dL — AB
Ketones, ur: NEGATIVE mg/dL
Leukocytes,Ua: NEGATIVE
Nitrite: NEGATIVE
Protein, ur: NEGATIVE mg/dL
Specific Gravity, Urine: 1.035 — ABNORMAL HIGH (ref 1.005–1.030)
pH: 5 (ref 5.0–8.0)

## 2020-04-01 LAB — CBC WITH DIFFERENTIAL/PLATELET
Abs Immature Granulocytes: 0.02 10*3/uL (ref 0.00–0.07)
Basophils Absolute: 0 10*3/uL (ref 0.0–0.1)
Basophils Relative: 0 %
Eosinophils Absolute: 0.1 10*3/uL (ref 0.0–0.5)
Eosinophils Relative: 2 %
HCT: 41.3 % (ref 36.0–46.0)
Hemoglobin: 13.2 g/dL (ref 12.0–15.0)
Immature Granulocytes: 0 %
Lymphocytes Relative: 56 %
Lymphs Abs: 4.2 10*3/uL — ABNORMAL HIGH (ref 0.7–4.0)
MCH: 27.5 pg (ref 26.0–34.0)
MCHC: 32 g/dL (ref 30.0–36.0)
MCV: 86 fL (ref 80.0–100.0)
Monocytes Absolute: 0.4 10*3/uL (ref 0.1–1.0)
Monocytes Relative: 6 %
Neutro Abs: 2.7 10*3/uL (ref 1.7–7.7)
Neutrophils Relative %: 36 %
Platelets: 227 10*3/uL (ref 150–400)
RBC: 4.8 MIL/uL (ref 3.87–5.11)
RDW: 12.3 % (ref 11.5–15.5)
WBC: 7.4 10*3/uL (ref 4.0–10.5)
nRBC: 0 % (ref 0.0–0.2)

## 2020-04-01 LAB — COMPREHENSIVE METABOLIC PANEL
ALT: 19 U/L (ref 0–44)
AST: 20 U/L (ref 15–41)
Albumin: 3.2 g/dL — ABNORMAL LOW (ref 3.5–5.0)
Alkaline Phosphatase: 82 U/L (ref 38–126)
Anion gap: 9 (ref 5–15)
BUN: 10 mg/dL (ref 6–20)
CO2: 25 mmol/L (ref 22–32)
Calcium: 9.1 mg/dL (ref 8.9–10.3)
Chloride: 99 mmol/L (ref 98–111)
Creatinine, Ser: 0.73 mg/dL (ref 0.44–1.00)
GFR calc Af Amer: >60 mL/min (ref >60)
GFR calc non Af Amer: >60 mL/min (ref >60)
Glucose, Bld: 453 mg/dL — ABNORMAL HIGH (ref 70–99)
Potassium: 4.4 mmol/L (ref 3.5–5.1)
Sodium: 133 mmol/L — ABNORMAL LOW (ref 135–145)
Total Bilirubin: 0.4 mg/dL (ref 0.3–1.2)
Total Protein: 7 g/dL (ref 6.5–8.1)

## 2020-04-01 LAB — APTT: aPTT: 28 seconds (ref 24–36)

## 2020-04-01 LAB — TYPE AND SCREEN
ABO/RH(D): O POS
Antibody Screen: NEGATIVE

## 2020-04-01 LAB — PROTIME-INR
INR: 1 (ref 0.8–1.2)
Prothrombin Time: 12.8 seconds (ref 11.4–15.2)

## 2020-04-01 LAB — I-STAT BETA HCG BLOOD, ED (MC, WL, AP ONLY): I-stat hCG, quantitative: <5 m[IU]/mL (ref <5)

## 2020-04-01 LAB — CBG MONITORING, ED: Glucose-Capillary: 358 mg/dL — ABNORMAL HIGH (ref 70–99)

## 2020-04-01 LAB — HIV ANTIBODY (ROUTINE TESTING W REFLEX): HIV Screen 4th Generation wRfx: NONREACTIVE

## 2020-04-01 LAB — ABO/RH: ABO/RH(D): O POS

## 2020-04-01 LAB — PREGNANCY, URINE: Preg Test, Ur: NEGATIVE

## 2020-04-01 IMAGING — MR MR CERVICAL SPINE W/O CM
7 of 8 series · 30 of 48 positions shown · non-contrast
Comparison: None.

CLINICAL DATA: Bilateral lower extremity weakness

EXAM:
MRI CERVICAL SPINE WITHOUT CONTRAST
TECHNIQUE: Multiplanar, multisequence MR imaging of the cervical spine was
performed. No intravenous contrast was administered.

[Series 6: T2 · sagittal · 3.0mm · 0.62mm/px · 2 of 15 slices shown (1 of 3)]
[im 1/15]
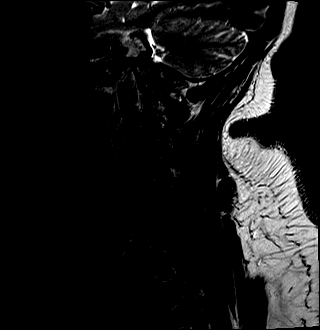
[im 15/15]
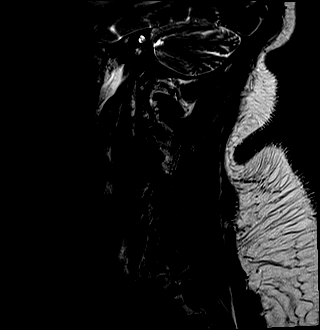

[Series 7: T1 · sagittal · 3.0mm · 0.62mm/px · 2 of 15 slices shown (1 of 2)]
[im 1/15]
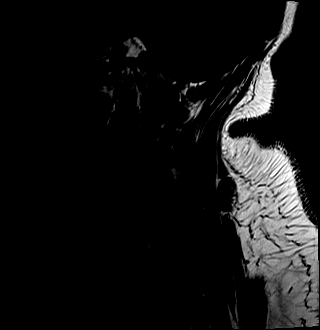
[im 15/15]
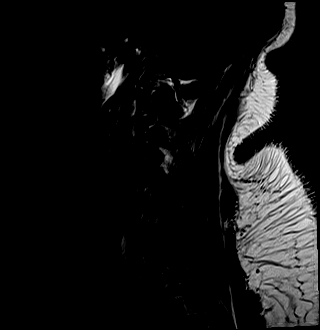

[Series 8: STIR · sagittal · 3.0mm · 0.78mm/px · 2 of 15 slices shown]
[im 1/15]
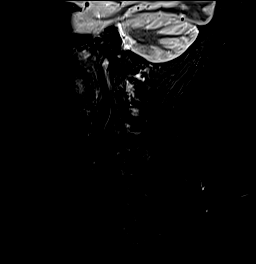
[im 15/15]
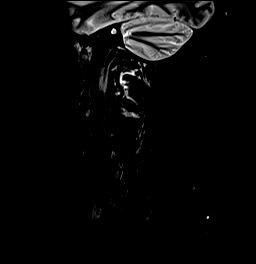

[Series 9: T2 · axial · 3.0mm · 0.66mm/px · z∈[-263,-137]mm · 6 of 40 slices shown (2 of 3)]
[im 1/40]
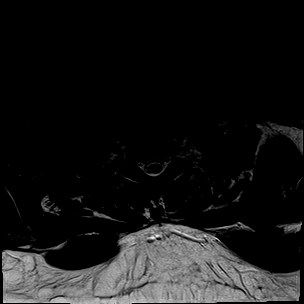
[im 8/40]
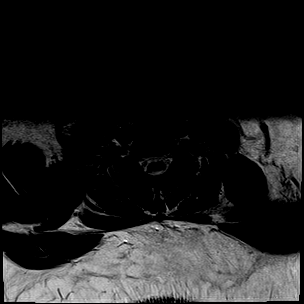
[im 16/40]
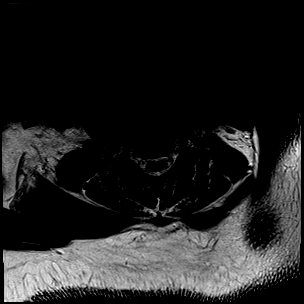
[im 24/40]
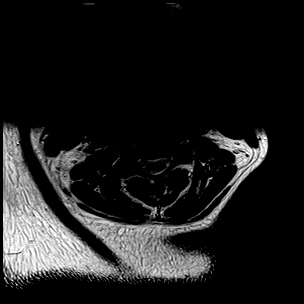
[im 32/40]
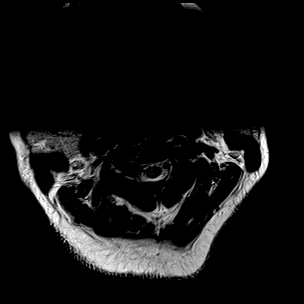
[im 40/40]
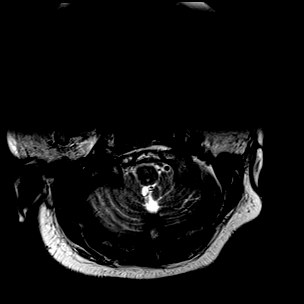

[Series 10: GRE · axial · 3.0mm · 0.39mm/px · z∈[-261,-213]mm · 3 of 40 slices shown]
[im 1/40]
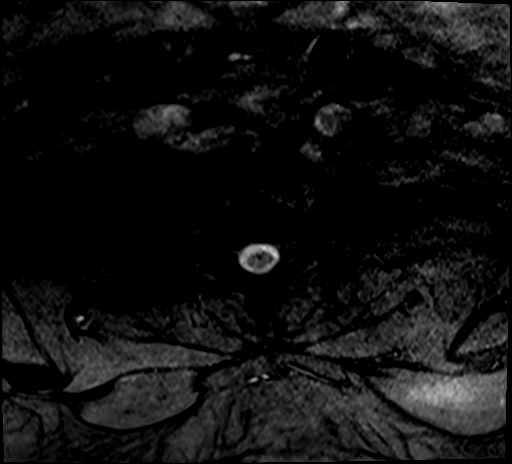
[im 8/40]
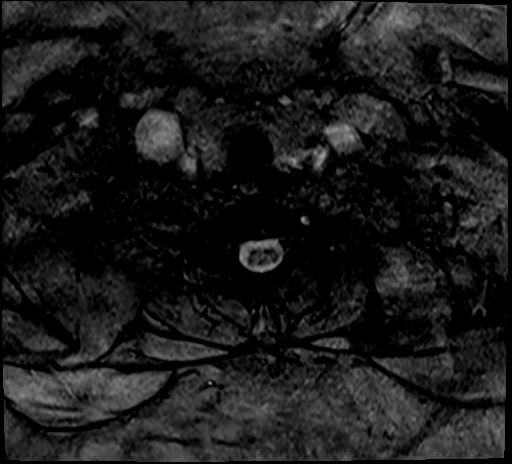
[im 16/40]
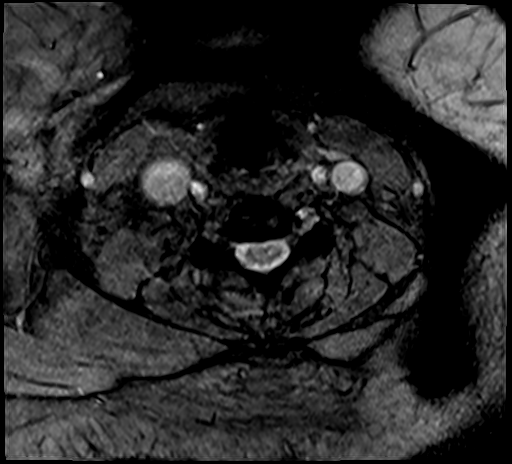

[Series 11: T1 · axial · 3.0mm · 0.39mm/px · z∈[-263,-137]mm · 6 of 40 slices shown (2 of 2)]
[im 1/40]
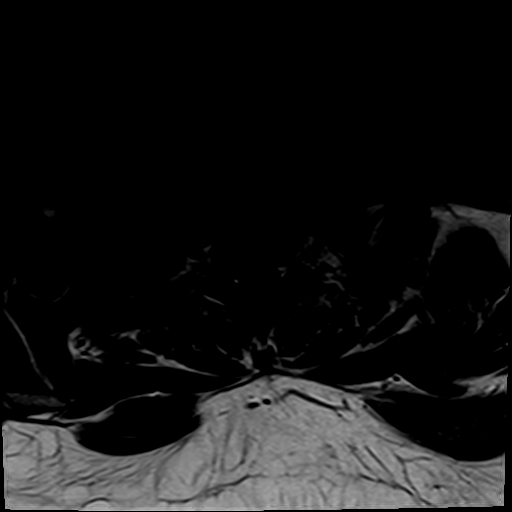
[im 8/40]
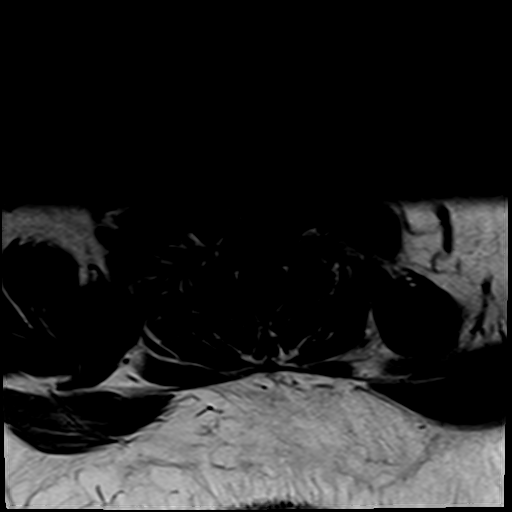
[im 16/40]
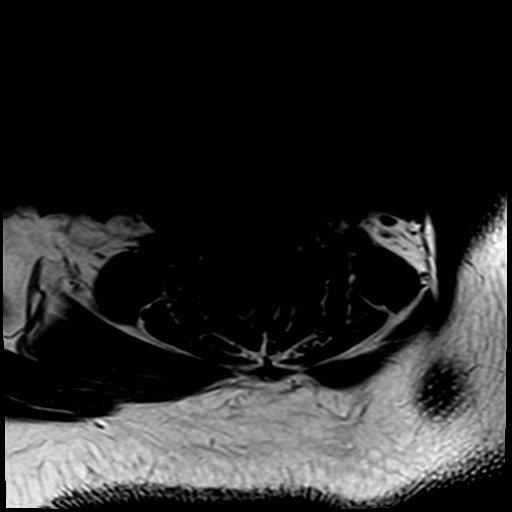
[im 24/40]
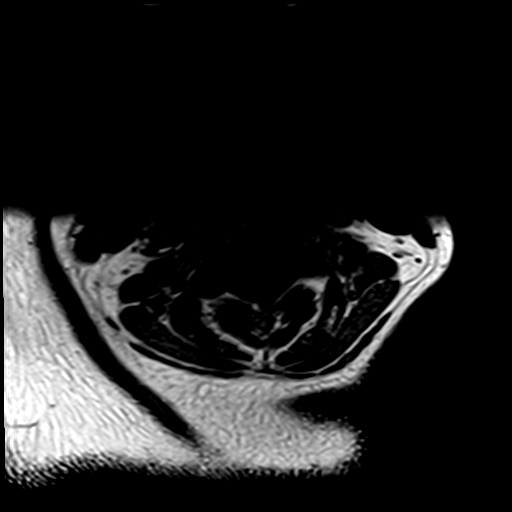
[im 32/40]
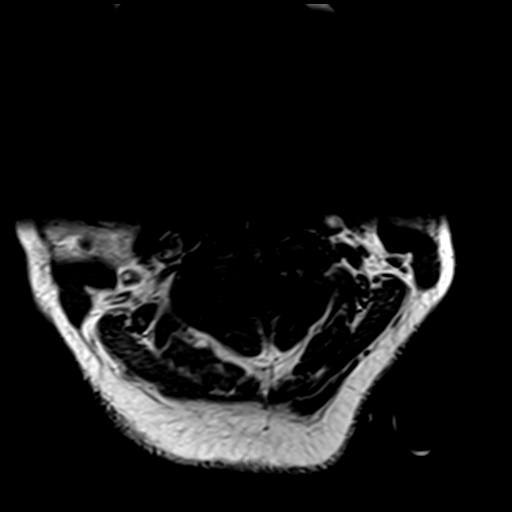
[im 40/40]
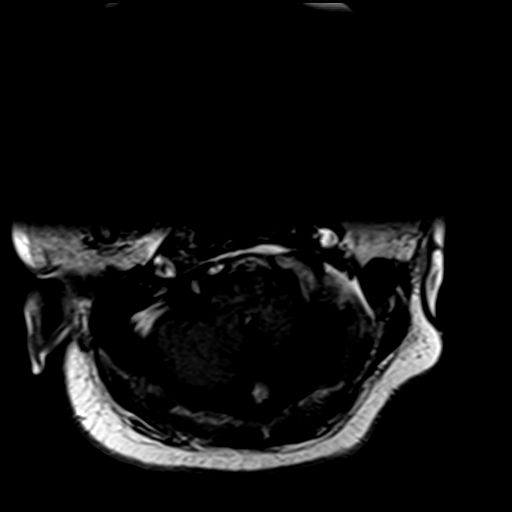

[Series 1001: T2 · axial · 2.0mm · 0.25mm/px · z∈[-288,-74]mm · 9 of 100 slices shown (3 of 3)]
[im 1/100]
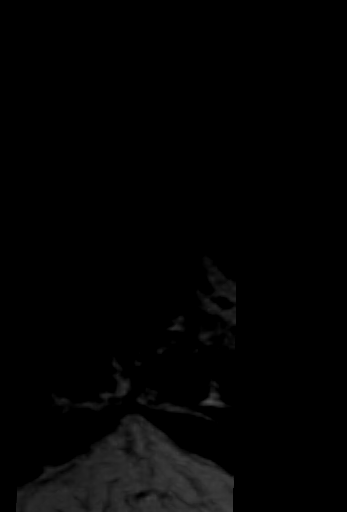
[im 15/100]
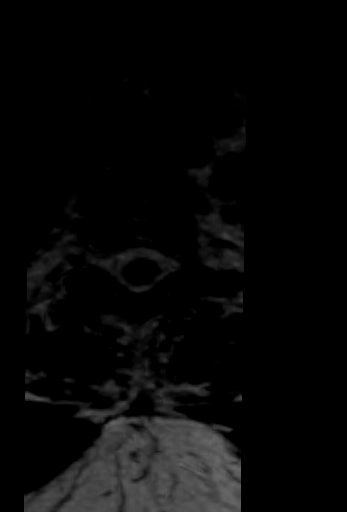
[im 29/100]
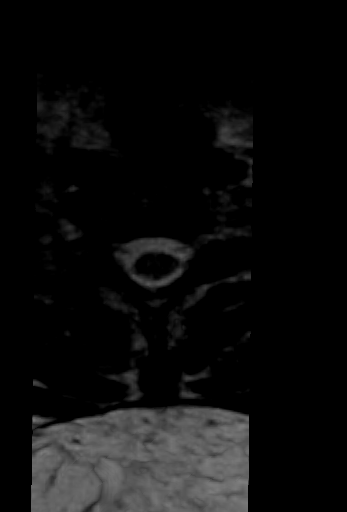
[im 43/100]
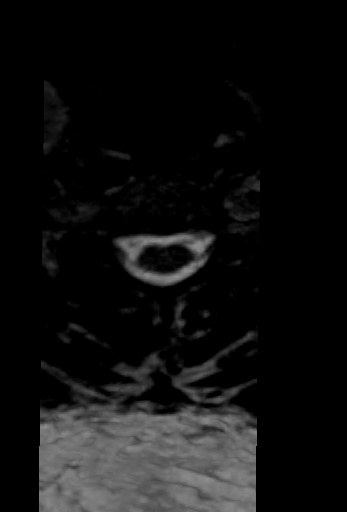
[im 50/100]
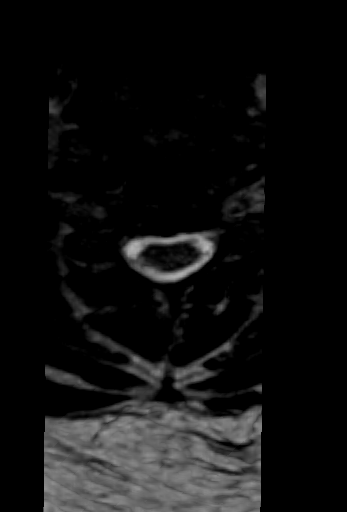
[im 57/100]
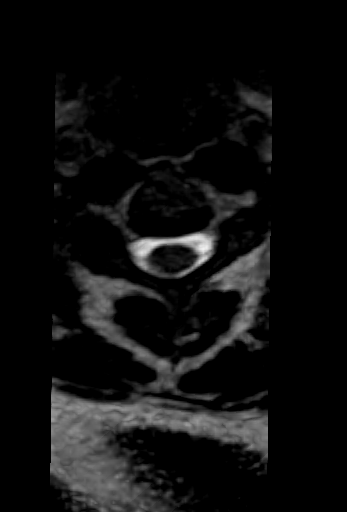
[im 71/100]
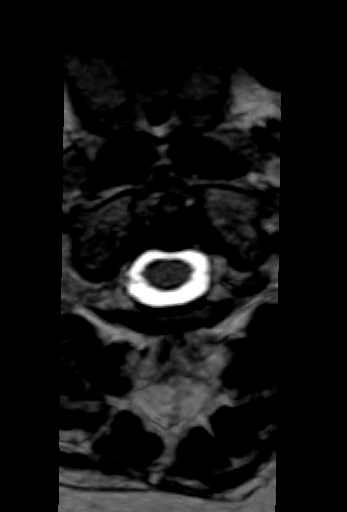
[im 85/100]
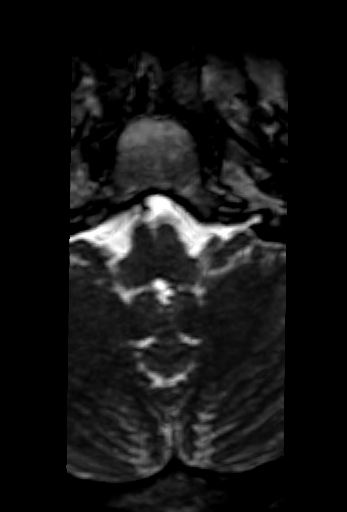
[im 100/100]
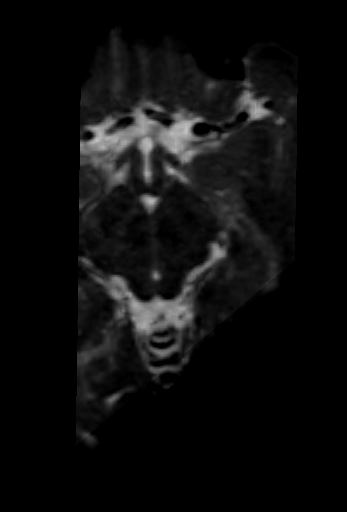

[30 of 48 positions shown; findings below may reference images not displayed]

FINDINGS: Alignment: Physiologic.  Straightening of normal cervical lordosis.

Vertebrae: No fracture, evidence of discitis, or bone lesion.

Cord: Normal signal and morphology.

Posterior Fossa, vertebral arteries, paraspinal tissues: Negative.

Disc levels:

No disc herniation or stenosis.
IMPRESSION: Straightening of normal cervical lordosis, which may be positional
or due to muscle spasm. Otherwise normal cervical spine MRI.

## 2020-04-01 IMAGING — MR MR LUMBAR SPINE WO/W CM
4 of 7 series · 25 of 48 positions shown · IV contrast (Gadavist)
Comparison: None.

CLINICAL DATA: Encephalopathy and difficulty walking

EXAM:
MRI LUMBAR SPINE WITHOUT AND WITH CONTRAST
TECHNIQUE: Multiplanar and multiecho pulse sequences of the lumbar spine were
obtained without and with intravenous contrast.
CONTRAST:  10mL GADAVIST GADOBUTROL 1 MMOL/ML IV SOLN

[Series 5: T2 · sagittal · 4.0mm · 0.73mm/px · 4 of 17 slices shown (1 of 2)]
[im 1/17]
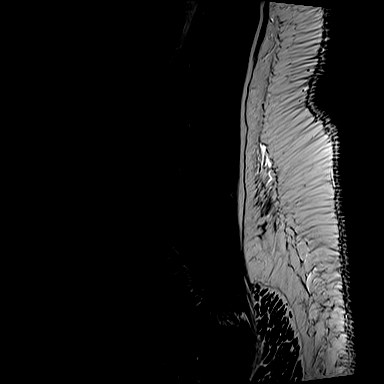
[im 6/17]
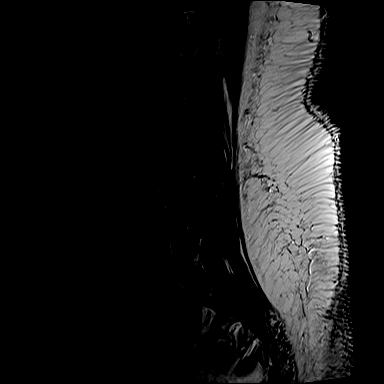
[im 11/17]
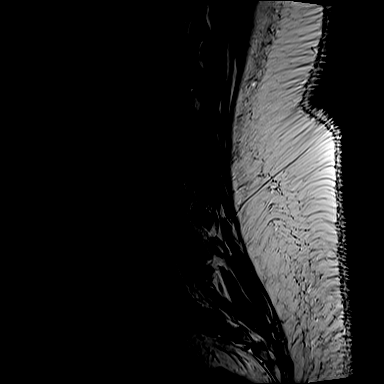
[im 17/17]
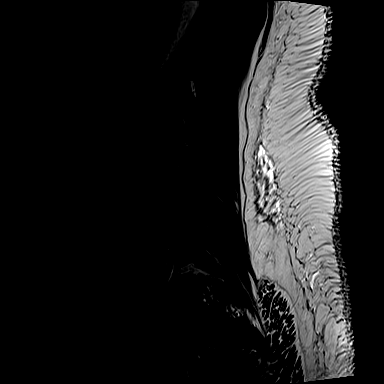

[Series 7: T1 · sagittal · 4.0mm · 0.88mm/px · 5 of 17 slices shown (1 of 2)]
[im 1/17]
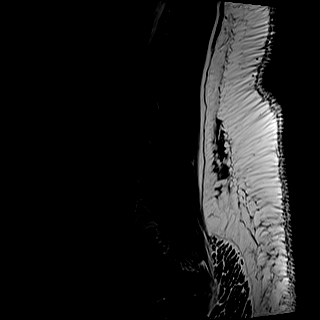
[im 5/17]
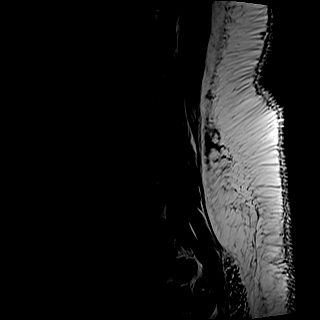
[im 9/17]
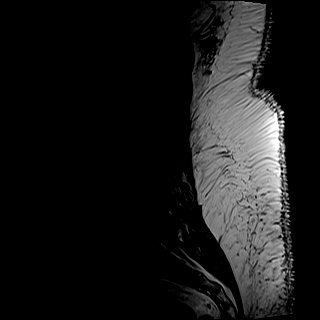
[im 13/17]
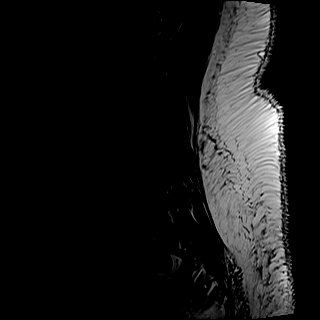
[im 17/17]
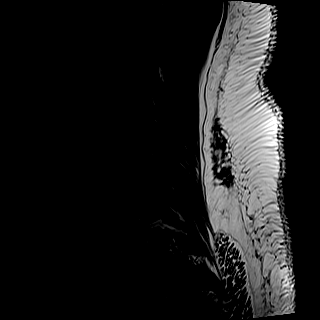

[Series 8: T2 · axial · 4.0mm · 0.57mm/px · z∈[-707,-490]mm · 8 of 36 slices shown (2 of 2)]
[im 1/36]
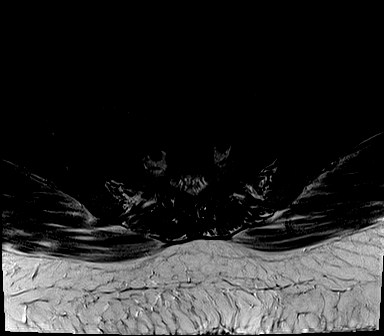
[im 4/36]
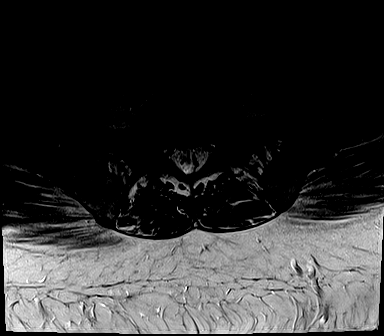
[im 12/36]
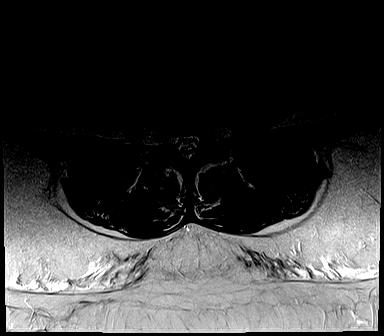
[im 16/36]
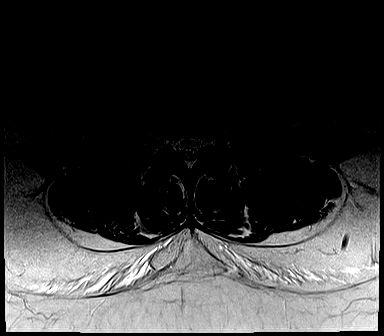
[im 20/36]
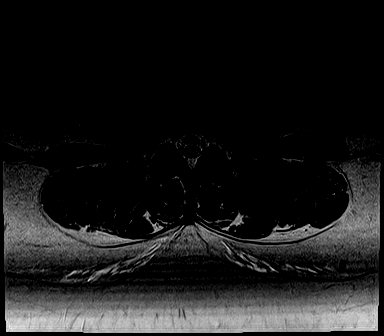
[im 24/36]
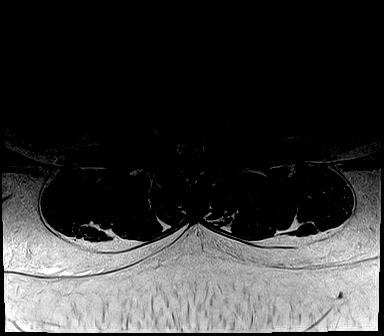
[im 32/36]
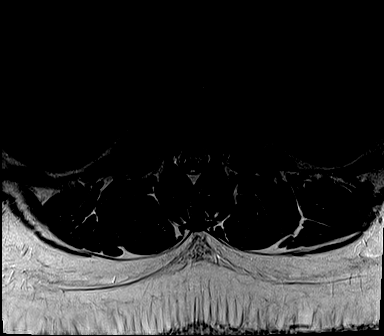
[im 36/36]
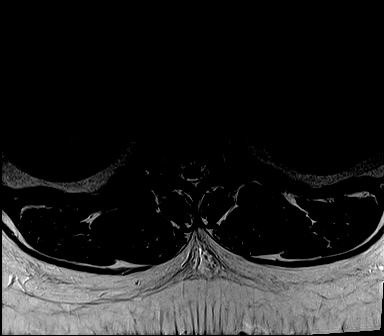

[Series 9: T1 · axial · 4.0mm · 0.34mm/px · z∈[-710,-489]mm · 8 of 36 slices shown (2 of 2)]
[im 1/36]
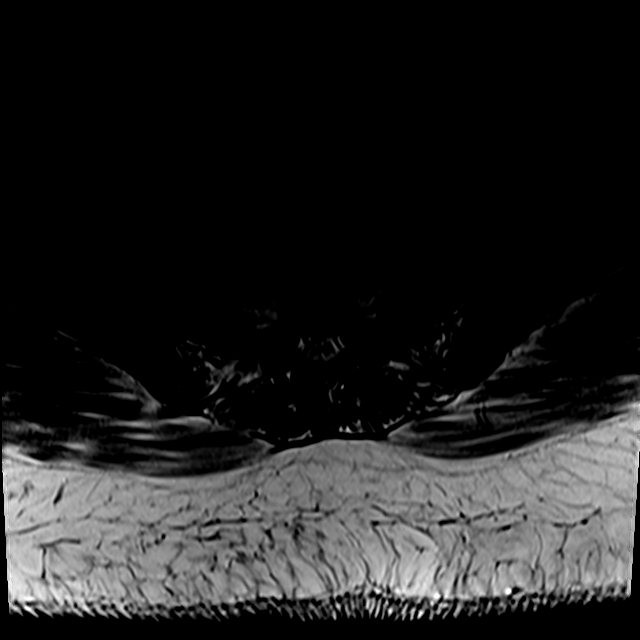
[im 4/36]
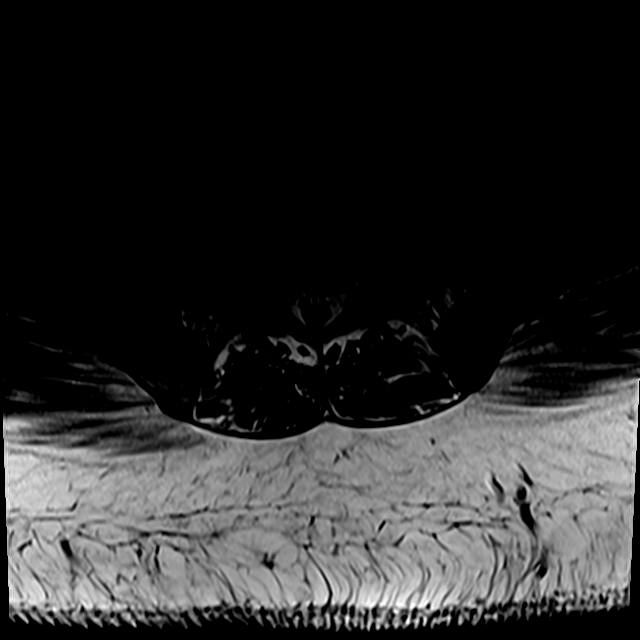
[im 12/36]
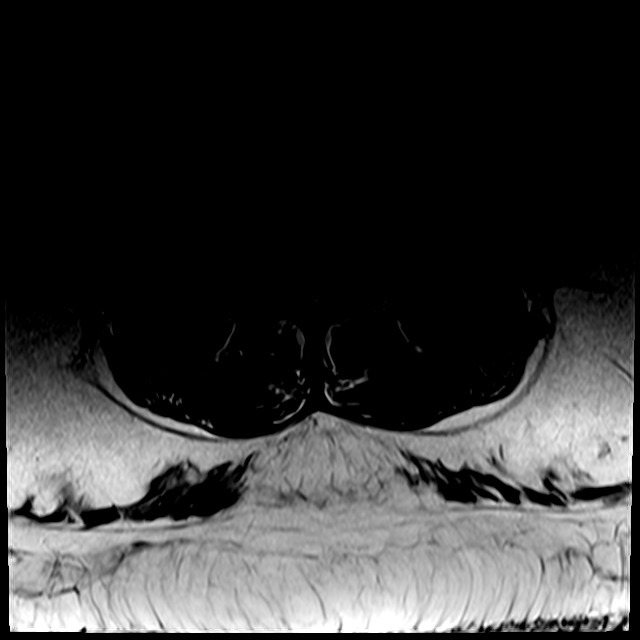
[im 16/36]
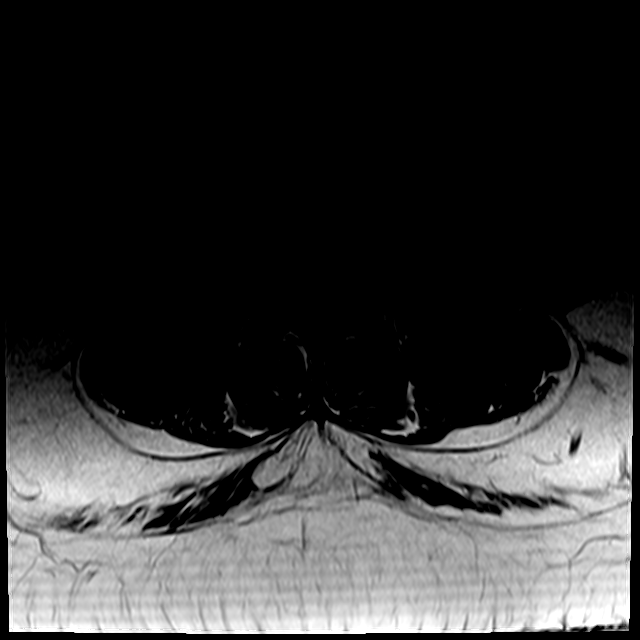
[im 20/36]
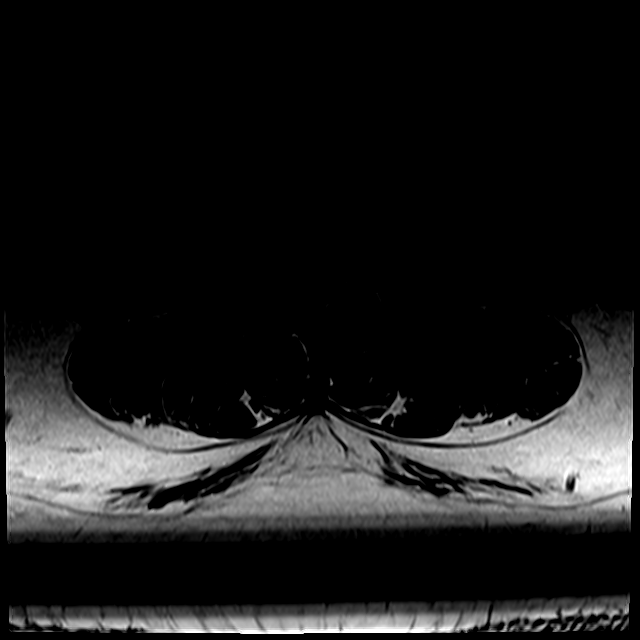
[im 24/36]
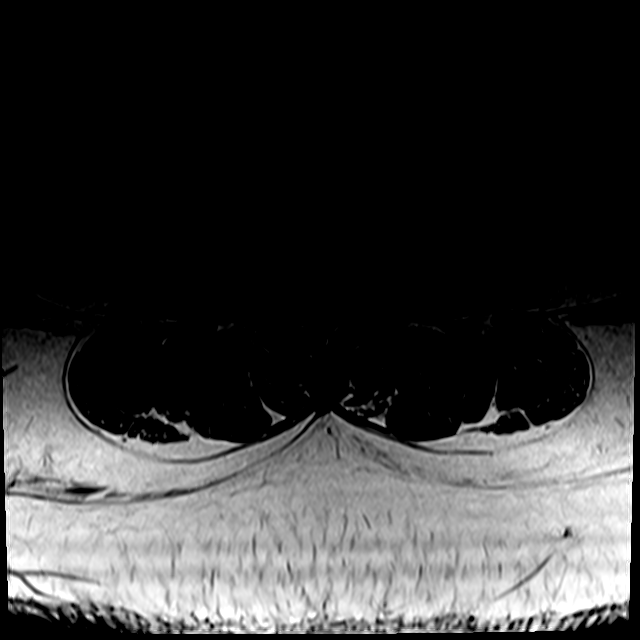
[im 32/36]
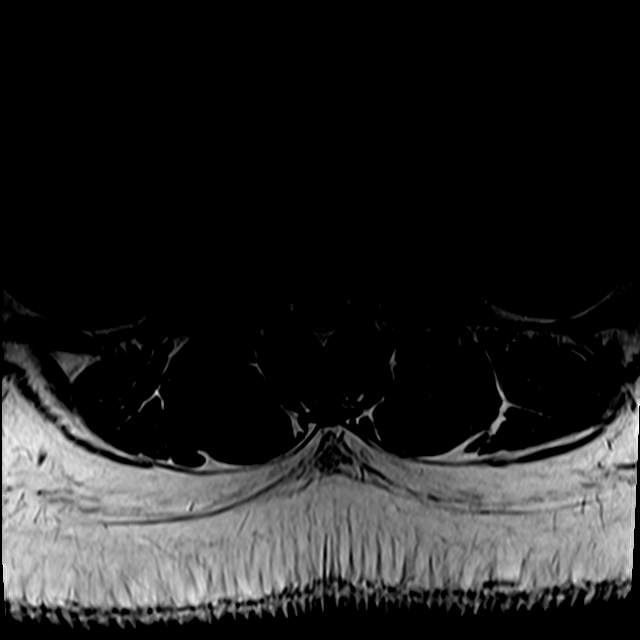
[im 36/36]
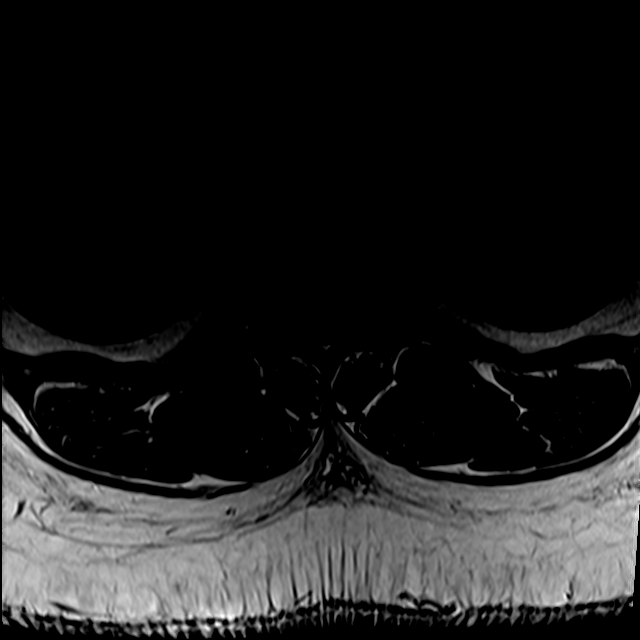

[25 of 48 positions shown; findings below may reference images not displayed]

FINDINGS: Segmentation:  Standard.

Alignment:  Physiologic.

Vertebrae:  No fracture, evidence of discitis, or bone lesion.

Conus medullaris and cauda equina: Conus extends to the L1 level.
Conus and cauda equina appear normal.

Paraspinal and other soft tissues: Negative.

Disc levels:

No disc herniation, spinal canal stenosis or neural impingement.

No abnormal contrast enhancement.
IMPRESSION: Normal MRI of the lumbar spine.

## 2020-04-01 IMAGING — MR MR THORACIC SPINE W/O CM
7 of 9 series · 28 of 48 positions shown · non-contrast
Comparison: None.

CLINICAL DATA: Difficulty walking.

EXAM:
MRI THORACIC SPINE WITHOUT CONTRAST
TECHNIQUE: Multiplanar, multisequence MR imaging of the thoracic spine was
performed. No intravenous contrast was administered.

[Series 16: T1 · sagittal · 5.0mm · 1.46mm/px · 2 of 9 slices shown (1 of 5)]
[im 1/9]
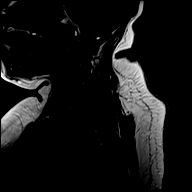
[im 9/9]
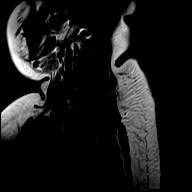

[Series 17: T1 · sagittal · 5.0mm · 1.09mm/px · 2 of 9 slices shown (2 of 5)]
[im 1/9]
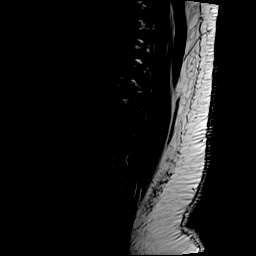
[im 9/9]
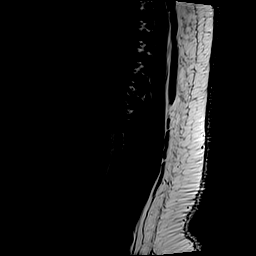

[Series 18: T1 · sagittal · 6.0mm · 1.09mm/px · 2 of 9 slices shown (3 of 5)]
[im 1/9]
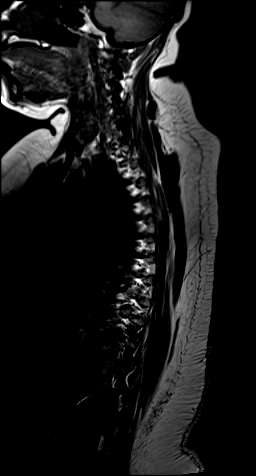
[im 9/9]
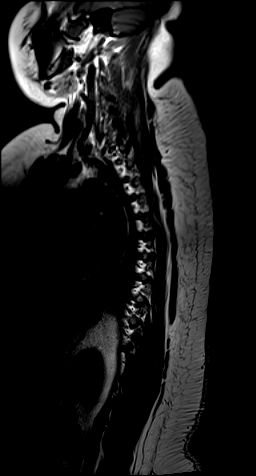

[Series 22: T2 · sagittal · 3.0mm · 0.71mm/px · 4 of 17 slices shown (1 of 2)]
[im 1/17]
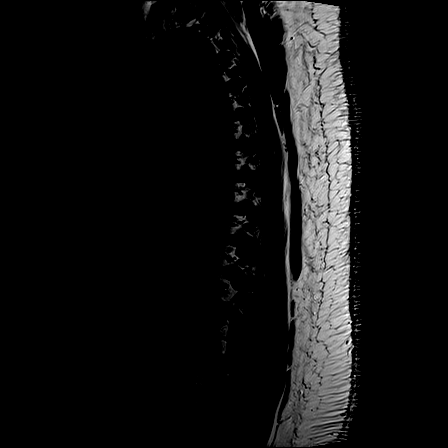
[im 6/17]
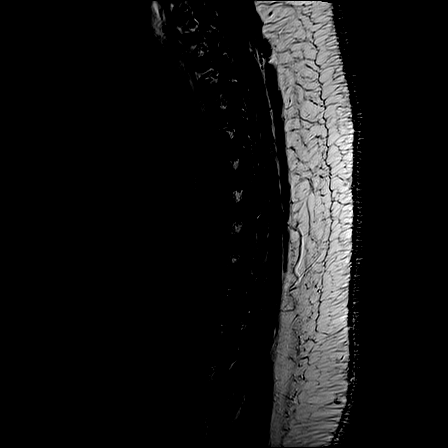
[im 11/17]
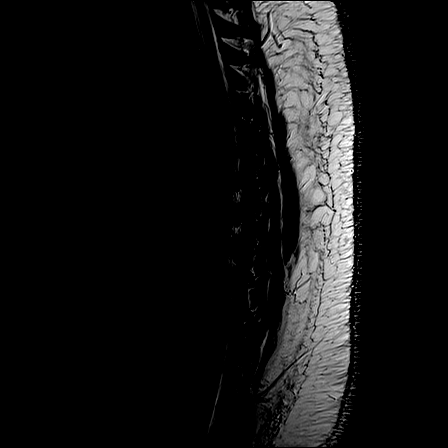
[im 17/17]
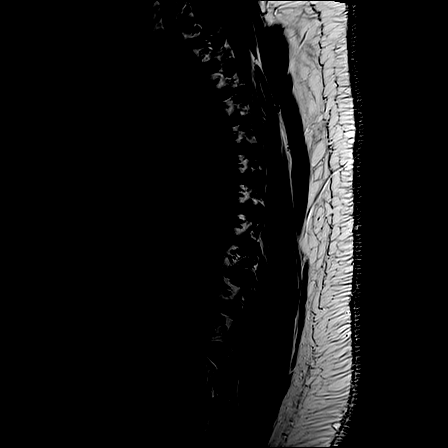

[Series 23: T1 · sagittal · 3.0mm · 0.71mm/px · 4 of 17 slices shown (4 of 5)]
[im 1/17]
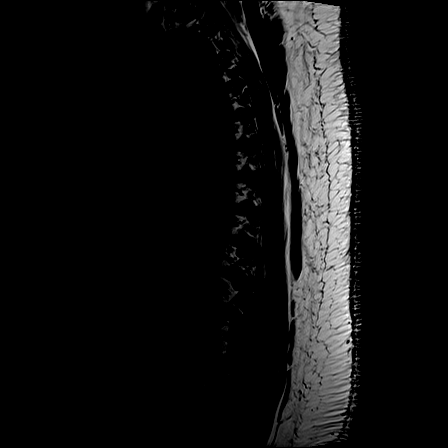
[im 6/17]
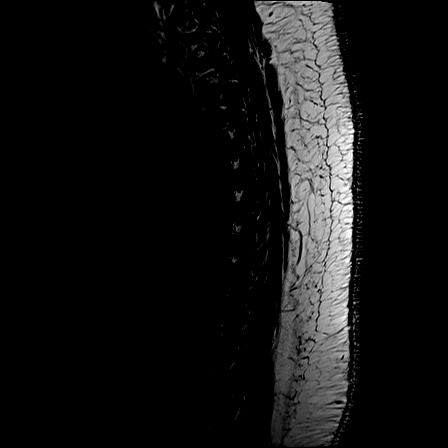
[im 11/17]
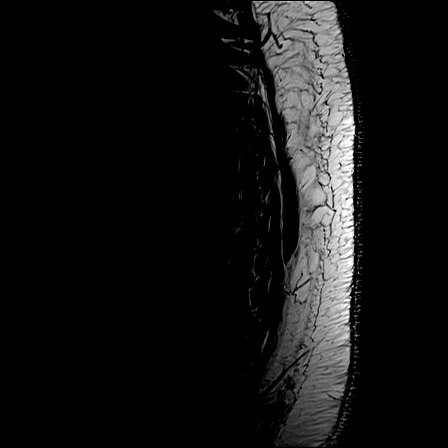
[im 17/17]
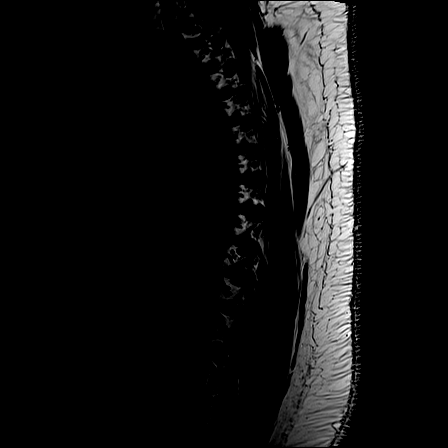

[Series 25: T2 · axial · 4.0mm · 0.59mm/px · z∈[-469,-230]mm · 8 of 39 slices shown (2 of 2)]
[im 1/39]
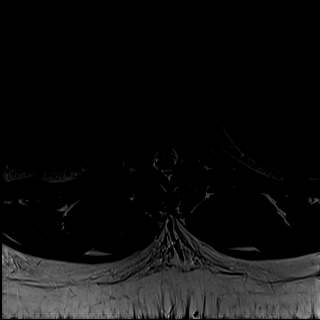
[im 5/39]
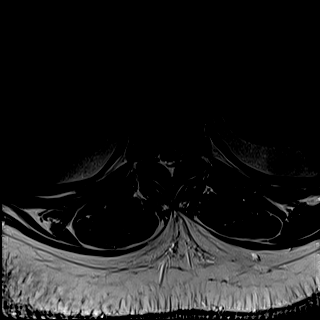
[im 13/39]
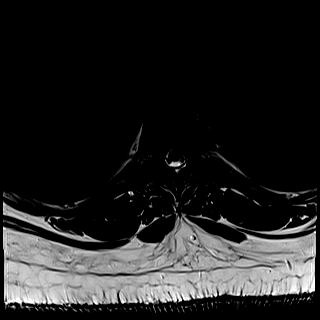
[im 17/39]
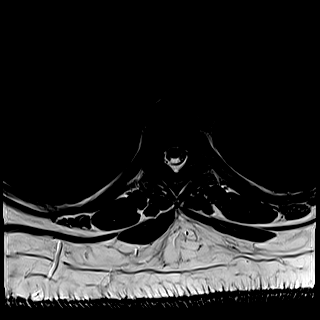
[im 22/39]
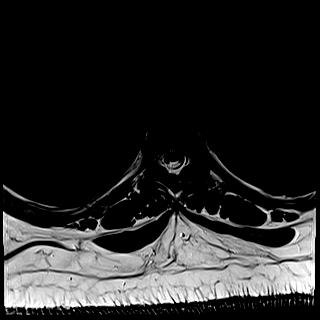
[im 26/39]
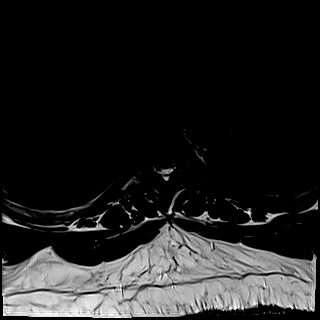
[im 34/39]
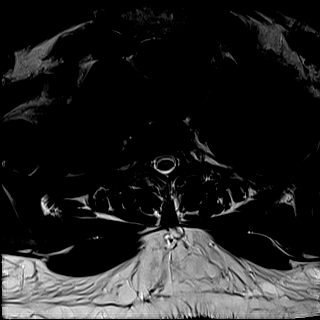
[im 39/39]
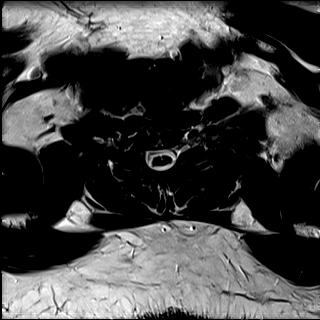

[Series 28: T1 · axial · non-contrast · 4.0mm · 0.31mm/px · z∈[-469,-313]mm · 6 of 39 slices shown (5 of 5)]
[im 1/39]
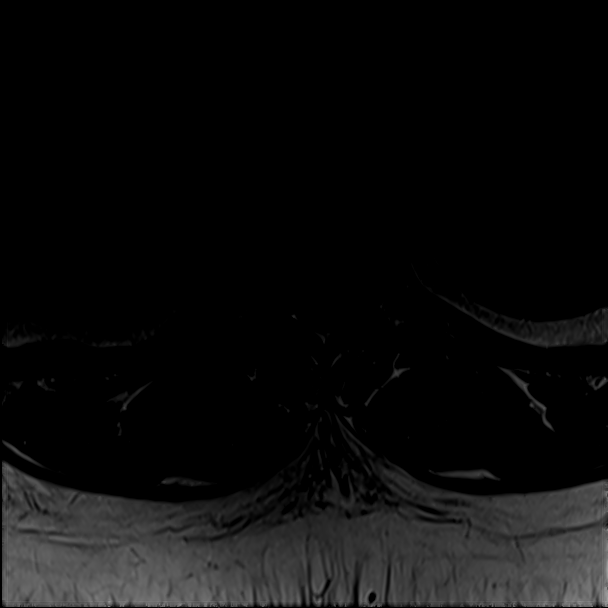
[im 5/39]
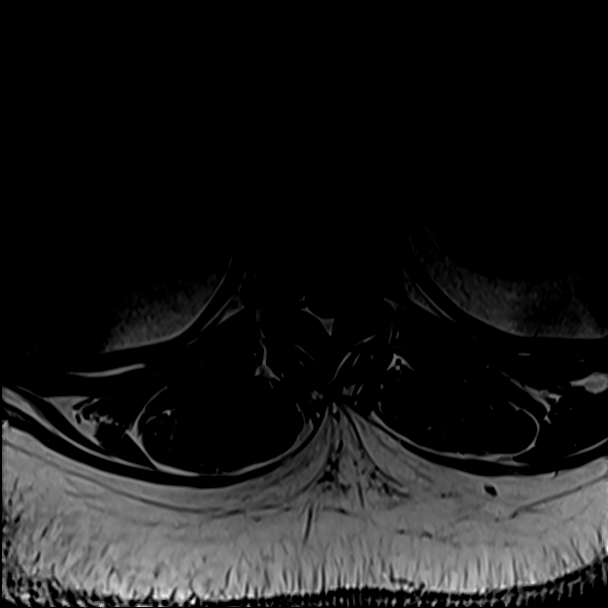
[im 13/39]
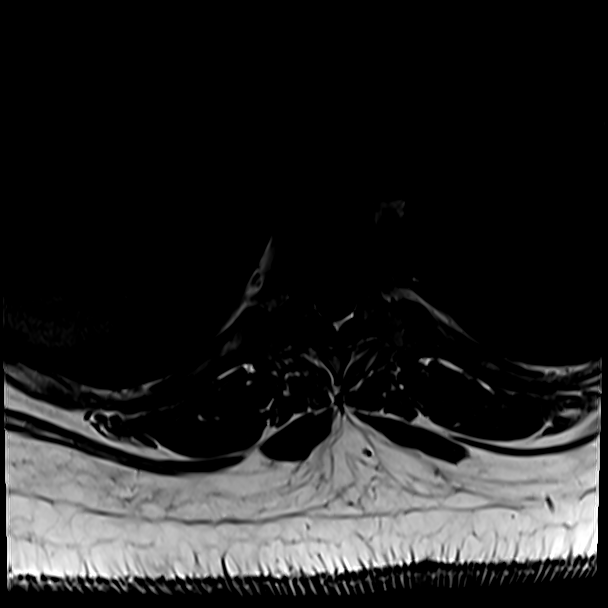
[im 17/39]
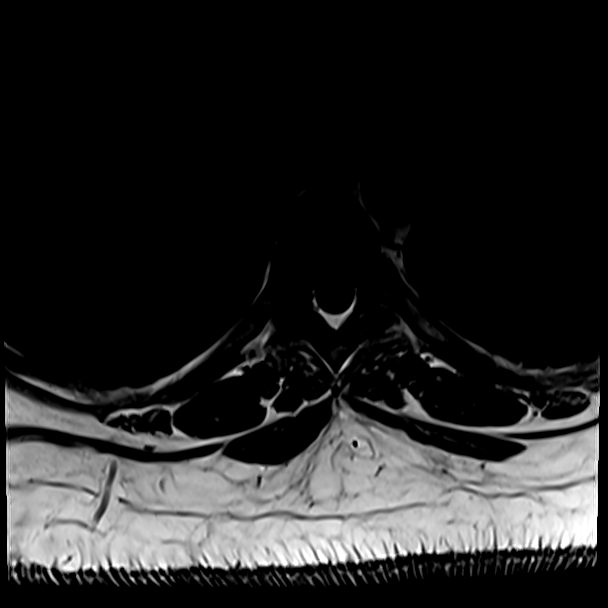
[im 22/39]
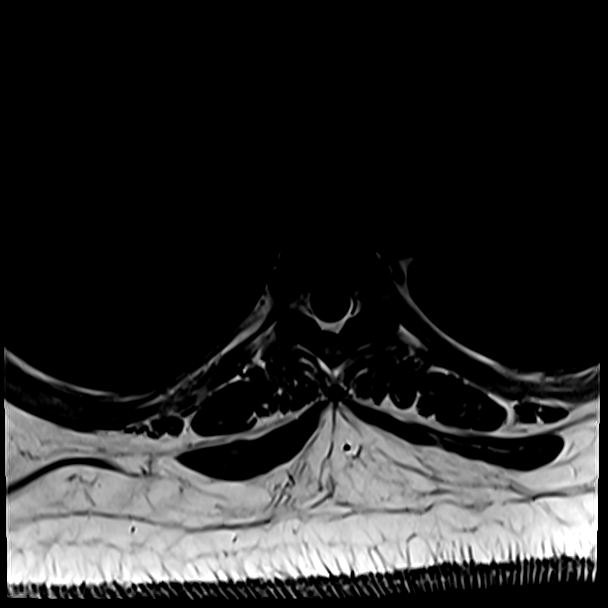
[im 26/39]
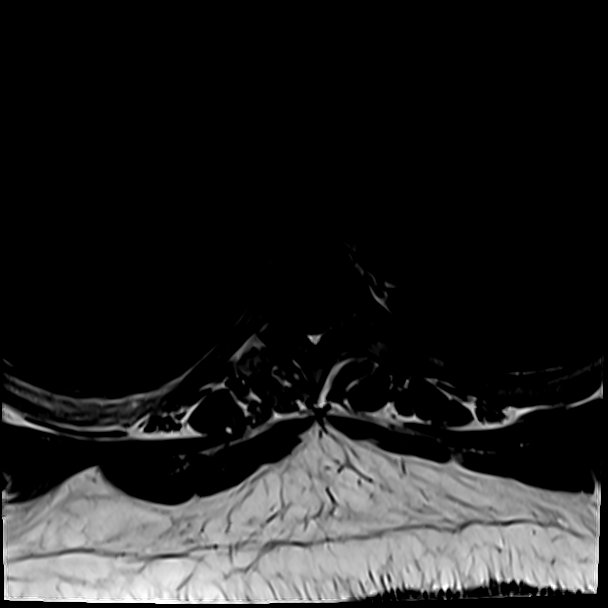

[28 of 48 positions shown; findings below may reference images not displayed]

FINDINGS: Alignment:  Physiologic.

Vertebrae: No fracture, evidence of discitis, or bone lesion.

Cord:  Normal signal and morphology.

Paraspinal and other soft tissues: Negative.

Disc levels:

No spinal canal stenosis.
IMPRESSION: Normal thoracic spine MRI.

## 2020-04-01 IMAGING — MR MR HEAD WO/W CM
14 of 16 series · 42 of 48 positions shown · IV contrast (gadavist)
Comparison: None.

CLINICAL DATA: Encephalopathy.  Bilateral lower extremity weakness.

EXAM:
MRI HEAD WITHOUT AND WITH CONTRAST
TECHNIQUE: Multiplanar, multiecho pulse sequences of the brain and surrounding
structures were obtained without and with intravenous contrast.
CONTRAST:  10mL GADAVIST GADOBUTROL 1 MMOL/ML IV SOLN

[Series 5: DWI · axial · 3.0mm · 0.88mm/px · z∈[-135,+11]mm · 8 of 100 slices shown (1 of 4)]
[im 1/100]
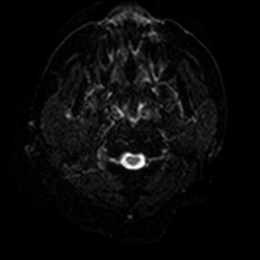
[im 15/100]
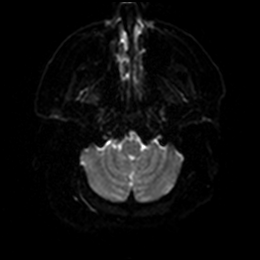
[im 29/100]
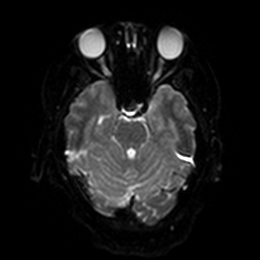
[im 43/100]
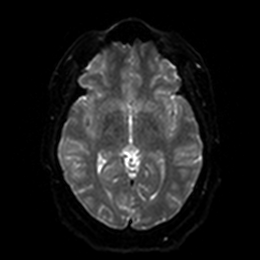
[im 57/100]
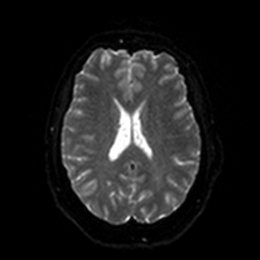
[im 71/100]
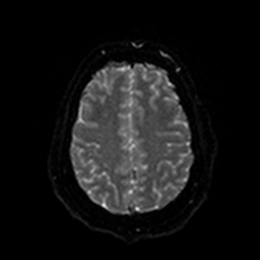
[im 85/100]
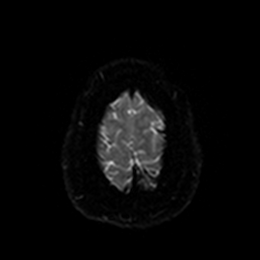
[im 100/100]
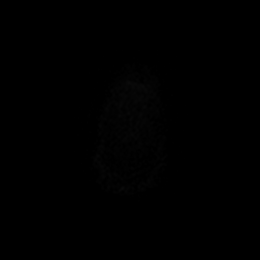

[Series 6: DWI · axial · 3.0mm · 0.88mm/px · z∈[-135,+11]mm · 4 of 50 slices shown (2 of 4)]
[im 1/50]
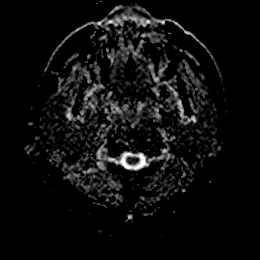
[im 17/50]
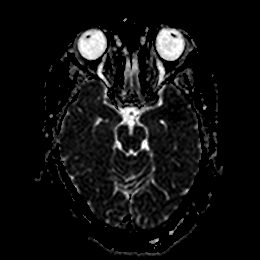
[im 33/50]
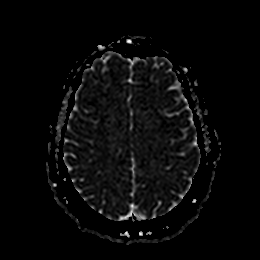
[im 50/50]
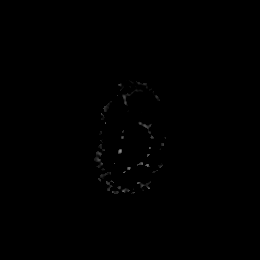

[Series 7: DWI · coronal · 4.0mm · 0.88mm/px · 4 of 66 slices shown (3 of 4)]
[im 1/66]
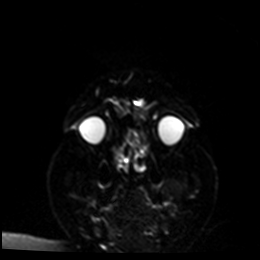
[im 22/66]
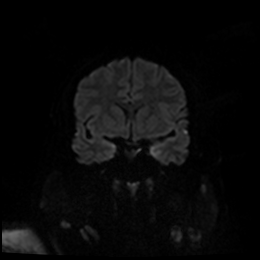
[im 44/66]
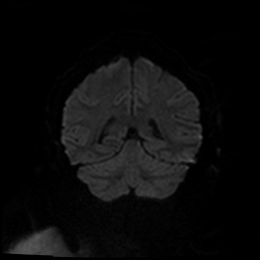
[im 66/66]
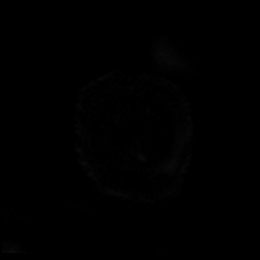

[Series 8: DWI · coronal · 4.0mm · 0.88mm/px · 2 of 33 slices shown (4 of 4)]
[im 1/33]
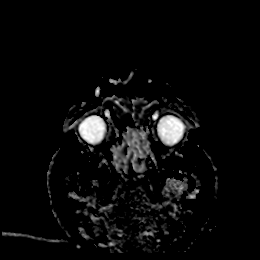
[im 33/33]
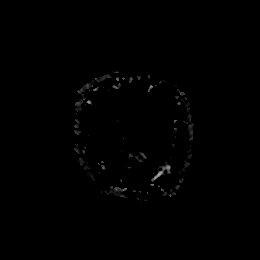

[Series 9: T1 · sagittal · 5.0mm · 0.75mm/px · 2 of 25 slices shown]
[im 1/25]
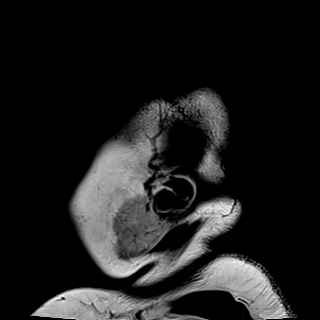
[im 25/25]
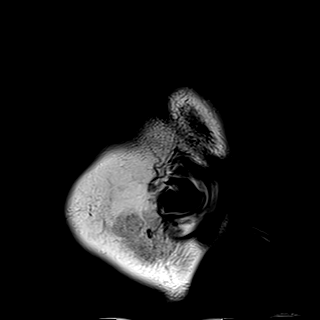

[Series 10: T2 · axial · 5.0mm · 0.72mm/px · z∈[-147,+8]mm · 2 of 27 slices shown (1 of 2)]
[im 1/27]
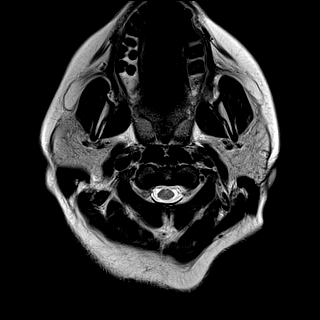
[im 27/27]
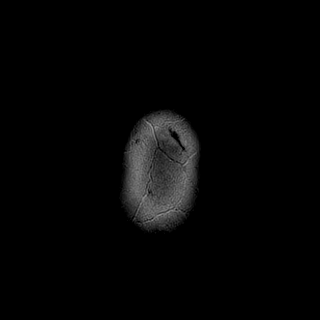

[Series 11: FLAIR · axial · 5.0mm · 0.45mm/px · z∈[-144,+11]mm · 2 of 27 slices shown]
[im 1/27]
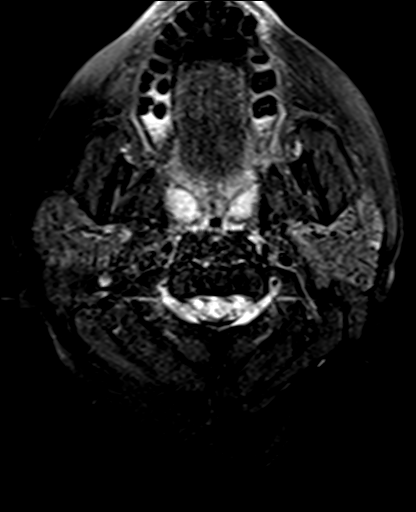
[im 27/27]
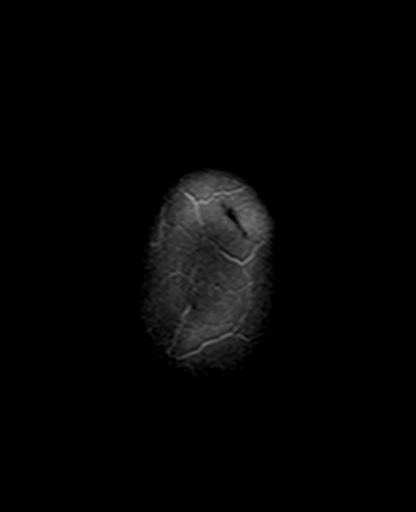

[Series 12: mag_images · axial · 3.0mm · 0.90mm/px · z∈[-142,+9]mm · 3 of 52 slices shown]
[im 1/52]
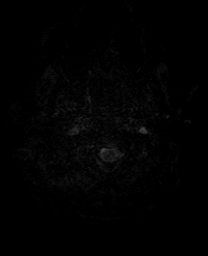
[im 26/52]
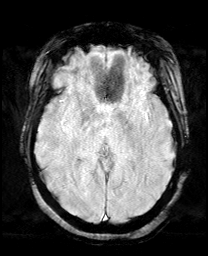
[im 52/52]
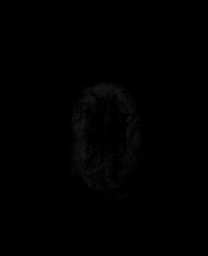

[Series 13: pha_images · axial · 3.0mm · 0.90mm/px · z∈[-142,+9]mm · 3 of 52 slices shown]
[im 1/52]
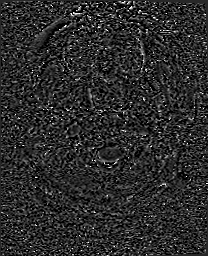
[im 26/52]
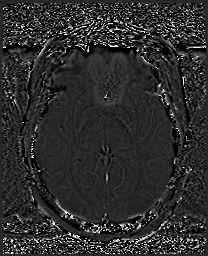
[im 52/52]
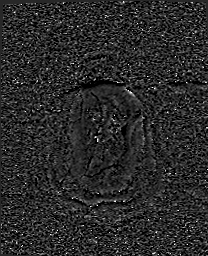

[Series 14: swi_images · axial · 3.0mm · 0.90mm/px · z∈[-142,+9]mm · 3 of 52 slices shown]
[im 1/52]
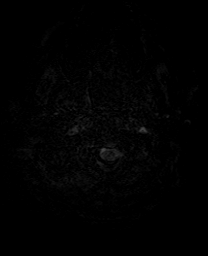
[im 26/52]
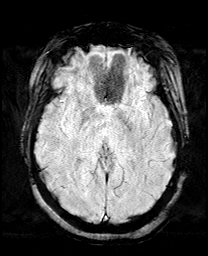
[im 52/52]
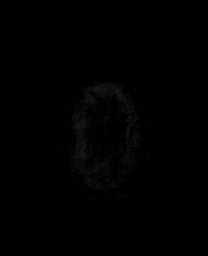

[Series 15: mip_images(sw) · axial · 24.0mm · 0.90mm/px · z∈[-132,-1]mm · 3 of 45 slices shown]
[im 1/45]
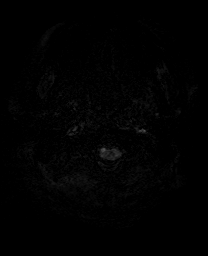
[im 23/45]
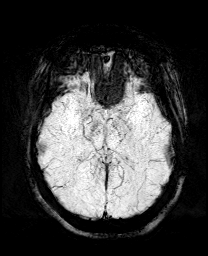
[im 45/45]
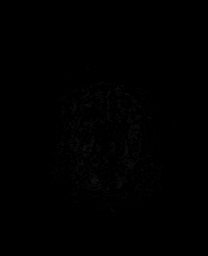

[Series 17: T2 · coronal · 5.0mm · 0.72mm/px · 2 of 30 slices shown (2 of 2)]
[im 1/30]
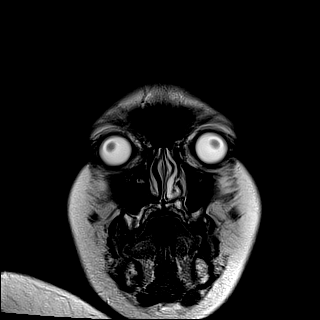
[im 30/30]
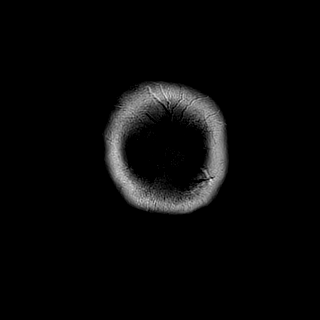

[Series 19: T1 post-contrast · coronal · 5.0mm · 0.34mm/px · 2 of 30 slices shown (1 of 2)]
[im 1/30]
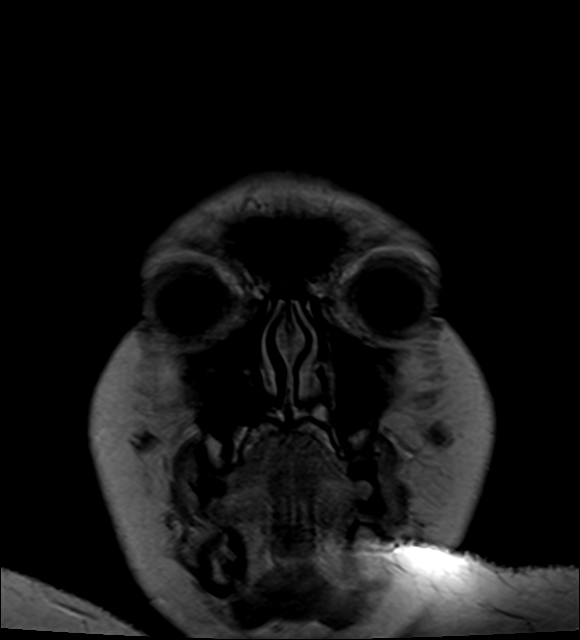
[im 30/30]
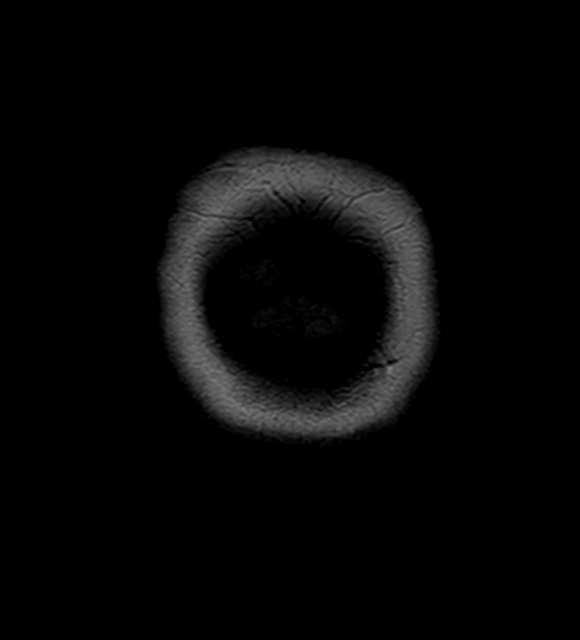

[Series 20: T1 post-contrast · sagittal · 5.0mm · 0.72mm/px · 2 of 25 slices shown (2 of 2)]
[im 1/25]
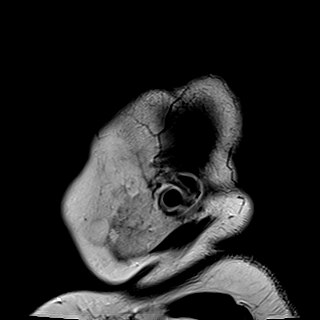
[im 25/25]
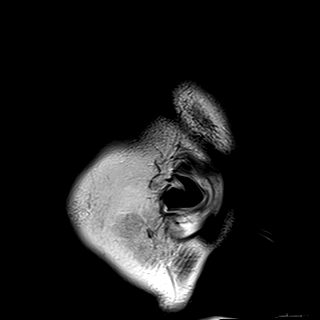

[42 of 48 positions shown; findings below may reference images not displayed]

FINDINGS: Brain: No acute infarct, acute hemorrhage or extra-axial collection.
Normal white matter signal. Normal volume of CSF spaces. No chronic
microhemorrhage. Normal midline structures.

Vascular: Normal flow voids.

Skull and upper cervical spine: Normal marrow signal.

Sinuses/Orbits: Negative.

Other: None.
IMPRESSION: Normal brain MRI.

## 2020-04-01 MED ORDER — DICLOFENAC SODIUM 1 % EX GEL
2.0000 g | Freq: Every day | CUTANEOUS | Status: DC
  Administered 2020-04-02 (×2): 2 g via TOPICAL
  Administered 2020-04-03: 22:00:00 8 g via TOPICAL
  Administered 2020-04-04: 4 g via TOPICAL
  Administered 2020-04-05: 2 g via TOPICAL
  Filled 2020-04-01: qty 100

## 2020-04-01 MED ORDER — INSULIN GLARGINE 100 UNIT/ML ~~LOC~~ SOLN
40.0000 [IU] | Freq: Every day | SUBCUTANEOUS | Status: DC
  Administered 2020-04-02 – 2020-04-04 (×4): 40 [IU] via SUBCUTANEOUS
  Filled 2020-04-01 (×5): qty 0.4

## 2020-04-01 MED ORDER — HYDROMORPHONE HCL 1 MG/ML IJ SOLN
0.5000 mg | INTRAMUSCULAR | Status: DC | PRN
  Administered 2020-04-01 – 2020-04-12 (×68): 1 mg via INTRAVENOUS
  Filled 2020-04-01 (×69): qty 1

## 2020-04-01 MED ORDER — ONDANSETRON HCL 4 MG PO TABS: 4.0000 mg | ORAL_TABLET | Freq: Four times a day (QID) | ORAL | Status: DC | PRN

## 2020-04-01 MED ORDER — CHLORTHALIDONE 25 MG PO TABS
25.0000 mg | ORAL_TABLET | Freq: Two times a day (BID) | ORAL | Status: DC
  Administered 2020-04-03 – 2020-04-11 (×4): 25 mg via ORAL
  Filled 2020-04-01 (×17): qty 1

## 2020-04-01 MED ORDER — ACETAMINOPHEN 650 MG RE SUPP: 650.0000 mg | Freq: Four times a day (QID) | RECTAL | Status: DC | PRN

## 2020-04-01 MED ORDER — GLIPIZIDE 5 MG PO TABS
5.0000 mg | ORAL_TABLET | Freq: Two times a day (BID) | ORAL | Status: DC
  Administered 2020-04-01 – 2020-04-04 (×7): 5 mg via ORAL
  Filled 2020-04-01 (×7): qty 1

## 2020-04-01 MED ORDER — HYDROCODONE-ACETAMINOPHEN 5-325 MG PO TABS: 1.0000 | ORAL_TABLET | ORAL | Status: DC | PRN

## 2020-04-01 MED ORDER — BISACODYL 10 MG RE SUPP: 10.0000 mg | Freq: Every day | RECTAL | Status: DC | PRN

## 2020-04-01 MED ORDER — LISINOPRIL 2.5 MG PO TABS
2.5000 mg | ORAL_TABLET | Freq: Every day | ORAL | Status: DC
  Administered 2020-04-07 – 2020-04-12 (×5): 2.5 mg via ORAL
  Filled 2020-04-01 (×11): qty 1

## 2020-04-01 MED ORDER — ACETAMINOPHEN 325 MG PO TABS
650.0000 mg | ORAL_TABLET | Freq: Four times a day (QID) | ORAL | Status: DC | PRN
  Administered 2020-04-02 – 2020-04-12 (×7): 650 mg via ORAL
  Filled 2020-04-01 (×7): qty 2

## 2020-04-01 MED ORDER — CYCLOBENZAPRINE HCL 10 MG PO TABS
10.0000 mg | ORAL_TABLET | Freq: Three times a day (TID) | ORAL | Status: DC | PRN
  Administered 2020-04-05 – 2020-04-12 (×17): 10 mg via ORAL
  Filled 2020-04-01 (×17): qty 1

## 2020-04-01 MED ORDER — ENOXAPARIN SODIUM 40 MG/0.4ML ~~LOC~~ SOLN
40.0000 mg | SUBCUTANEOUS | Status: DC
  Administered 2020-04-01 – 2020-04-02 (×2): 40 mg via SUBCUTANEOUS
  Filled 2020-04-01 (×2): qty 0.4

## 2020-04-01 MED ORDER — DOCUSATE SODIUM 100 MG PO CAPS
100.0000 mg | ORAL_CAPSULE | Freq: Two times a day (BID) | ORAL | Status: DC
  Administered 2020-04-02 – 2020-04-12 (×20): 100 mg via ORAL
  Filled 2020-04-01 (×22): qty 1

## 2020-04-01 MED ORDER — PREGABALIN 100 MG PO CAPS
300.0000 mg | ORAL_CAPSULE | Freq: Two times a day (BID) | ORAL | Status: DC
  Administered 2020-04-02 – 2020-04-12 (×22): 300 mg via ORAL
  Filled 2020-04-01 (×10): qty 3
  Filled 2020-04-01: qty 6
  Filled 2020-04-01 (×11): qty 3

## 2020-04-01 MED ORDER — OXYCODONE HCL 5 MG PO TABS
10.0000 mg | ORAL_TABLET | ORAL | Status: DC | PRN
  Administered 2020-04-01 – 2020-04-12 (×40): 10 mg via ORAL
  Filled 2020-04-01 (×43): qty 2

## 2020-04-01 MED ORDER — ONDANSETRON HCL 4 MG/2ML IJ SOLN: 4.0000 mg | Freq: Four times a day (QID) | INTRAMUSCULAR | Status: DC | PRN

## 2020-04-01 MED ORDER — TOPIRAMATE 25 MG PO TABS
50.0000 mg | ORAL_TABLET | Freq: Every morning | ORAL | Status: DC
  Administered 2020-04-02 – 2020-04-12 (×10): 50 mg via ORAL
  Filled 2020-04-01 (×10): qty 2

## 2020-04-01 MED ORDER — FUROSEMIDE 40 MG PO TABS
40.0000 mg | ORAL_TABLET | Freq: Every morning | ORAL | Status: DC
  Administered 2020-04-03 – 2020-04-04 (×2): 40 mg via ORAL
  Filled 2020-04-01 (×4): qty 1

## 2020-04-01 MED ORDER — NICOTINE 21 MG/24HR TD PT24
21.0000 mg | MEDICATED_PATCH | Freq: Every day | TRANSDERMAL | Status: DC
  Administered 2020-04-01 – 2020-04-12 (×12): 21 mg via TRANSDERMAL
  Filled 2020-04-01 (×13): qty 1

## 2020-04-01 MED ORDER — DIAZEPAM 5 MG PO TABS
10.0000 mg | ORAL_TABLET | Freq: Once | ORAL | Status: AC
  Administered 2020-04-01: 10 mg via ORAL
  Filled 2020-04-01: qty 2

## 2020-04-01 MED ORDER — POLYETHYLENE GLYCOL 3350 17 G PO PACK
17.0000 g | PACK | Freq: Every day | ORAL | Status: DC | PRN
  Filled 2020-04-01: qty 1

## 2020-04-01 MED ORDER — POTASSIUM CHLORIDE CRYS ER 20 MEQ PO TBCR
20.0000 meq | EXTENDED_RELEASE_TABLET | Freq: Every day | ORAL | Status: DC
  Administered 2020-04-03 – 2020-04-11 (×4): 20 meq via ORAL
  Filled 2020-04-01 (×11): qty 1

## 2020-04-01 NOTE — ED Triage Notes (Signed)
Pt reports bilateral leg weakness and unable to walk x 2 days.  States she was seen by neurologist yesterday and sent to ED.  Pt states after she arrived the neurology office called her and told her to leave and go home and they would do a direct admit.  States they called her again this morning and told her to come back to ED due to no available beds for admission.  Also reports pain to bilateral legs.

## 2020-04-01 NOTE — Progress Notes (Signed)
Patient arrived to unit as direct admit; MD paged for orders.

## 2020-04-02 LAB — GLUCOSE, CAPILLARY
Glucose-Capillary: 337 mg/dL — ABNORMAL HIGH (ref 70–99)
Glucose-Capillary: 311 mg/dL — ABNORMAL HIGH (ref 70–99)
Glucose-Capillary: 290 mg/dL — ABNORMAL HIGH (ref 70–99)
Glucose-Capillary: 195 mg/dL — ABNORMAL HIGH (ref 70–99)

## 2020-04-02 LAB — HEMOGLOBIN A1C
Hgb A1c MFr Bld: 14.6 % — ABNORMAL HIGH (ref 4.8–5.6)
Mean Plasma Glucose: 372.32 mg/dL

## 2020-04-02 MED ORDER — INSULIN ASPART 100 UNIT/ML ~~LOC~~ SOLN
0.0000 [IU] | Freq: Three times a day (TID) | SUBCUTANEOUS | Status: DC
  Administered 2020-04-02: 11 [IU] via SUBCUTANEOUS
  Administered 2020-04-02 – 2020-04-03 (×2): 15 [IU] via SUBCUTANEOUS
  Administered 2020-04-03 (×2): 7 [IU] via SUBCUTANEOUS
  Administered 2020-04-04: 15 [IU] via SUBCUTANEOUS
  Administered 2020-04-04 – 2020-04-05 (×2): 7 [IU] via SUBCUTANEOUS
  Administered 2020-04-05 – 2020-04-06 (×4): 11 [IU] via SUBCUTANEOUS
  Administered 2020-04-06: 7 [IU] via SUBCUTANEOUS
  Administered 2020-04-07: 3 [IU] via SUBCUTANEOUS
  Administered 2020-04-07: 11 [IU] via SUBCUTANEOUS
  Administered 2020-04-07: 4 [IU] via SUBCUTANEOUS
  Administered 2020-04-08: 11 [IU] via SUBCUTANEOUS
  Administered 2020-04-08: 7 [IU] via SUBCUTANEOUS
  Administered 2020-04-08: 4 [IU] via SUBCUTANEOUS
  Administered 2020-04-09: 11 [IU] via SUBCUTANEOUS
  Administered 2020-04-09: 7 [IU] via SUBCUTANEOUS
  Administered 2020-04-09: 4 [IU] via SUBCUTANEOUS
  Administered 2020-04-10 (×2): 7 [IU] via SUBCUTANEOUS
  Administered 2020-04-10 – 2020-04-11 (×3): 4 [IU] via SUBCUTANEOUS
  Administered 2020-04-11: 7 [IU] via SUBCUTANEOUS
  Administered 2020-04-12: 3 [IU] via SUBCUTANEOUS

## 2020-04-02 MED ORDER — INSULIN ASPART 100 UNIT/ML ~~LOC~~ SOLN
0.0000 [IU] | Freq: Every day | SUBCUTANEOUS | Status: DC
  Administered 2020-04-03 – 2020-04-05 (×3): 3 [IU] via SUBCUTANEOUS
  Administered 2020-04-06: 2 [IU] via SUBCUTANEOUS
  Administered 2020-04-07 – 2020-04-08 (×2): 3 [IU] via SUBCUTANEOUS
  Administered 2020-04-09: 2 [IU] via SUBCUTANEOUS

## 2020-04-02 MED ORDER — GADOBUTROL 1 MMOL/ML IV SOLN
10.0000 mL | Freq: Once | INTRAVENOUS | Status: AC | PRN
  Administered 2020-04-02: 10 mL via INTRAVENOUS

## 2020-04-02 NOTE — Progress Notes (Signed)
Occupational Therapy Progress Note  Kinesiotape applied to Lt shoulder/upper trap area to reduce pain.  Pt instructed in purpose of kinesiotape and verbalized understanding.  She was also instructed in initial HEP for scap retraction and depression as well as head/neck rotation, lateral flexion, flexion and extension.  She verbalized/demonstrated understanding.  Recommend CIR.    04/02/20 1400  OT Visit Information  Last OT Received On 04/02/20  Assistance Needed +2  History of Present Illness This 34 y.o. female admitted for evaluation of neck pain, and Lt UE weakness, and bil. LE weakness as well as accompanying neck pain and urinary incontinence. MRI of brain, cervical, thoracic, and lumbar spine negative for acute lesions.  Work up underway.  PMH includes.  Bipolar disorder, episodic opiate abuse, Major depressive disorder, h/o suicide attempt, sedative, hypnotic, or anxiolytic abuse disorder, LE edema, morbid obesity, h/o paraparesis 2013, DM, OSA  Precautions  Precautions Fall  Precaution Comments reports falls "a lot" at home; describes knees buckling  Pain Assessment  Pain Assessment Faces  Faces Pain Scale 6  Pain Location Lt shoulder bil. LEs   Pain Descriptors / Indicators Heaviness;Guarding;Sharp;Stabbing  Pain Intervention(s) Monitored during session;Other (comment) (applied kinesiotape to Lt shoulder )  Cognition  Arousal/Alertness Awake/alert  Behavior During Therapy WFL for tasks assessed/performed  Overall Cognitive Status Within Functional Limits for tasks assessed  Upper Extremity Assessment  Upper Extremity Assessment LUE deficits/detail  LUE Deficits / Details Pt noted to be holding phone in her left hand while talking to family member   LUE Sensation decreased light touch  LUE Coordination decreased fine motor;decreased gross motor  Lower Extremity Assessment  Lower Extremity Assessment Defer to PT evaluation  Exercises  Exercises Other exercises  Other  Exercises  Other Exercises An "I" strip was applied from upper tray over Lt shoulder with zero stretch and with head/neck rotated to Rt and flexed.  Pt instructed in purpose of kinesiotape.    Other Exercises Pt instructed in shoulder retraction and depression exercises  as well as head/neck rotation, flexion, and lateral flexion and was able to return demonstration    OT - End of Session  Activity Tolerance Patient tolerated treatment well  Patient left in bed;with call bell/phone within reach;with bed alarm set  Nurse Communication Other (comment) (kinesiotape Lt shoulder )  OT Assessment/Plan  OT Plan Discharge plan needs to be updated  OT Visit Diagnosis Unsteadiness on feet (R26.81);Pain  Pain - Right/Left Left  Pain - part of body Shoulder;Arm;Knee;Leg;Ankle and joints of foot  OT Frequency (ACUTE ONLY) Min 2X/week  Follow Up Recommendations CIR  OT Equipment 3 in 1 bedside commode  AM-PAC OT "6 Clicks" Daily Activity Outcome Measure (Version 2)  Help from another person eating meals? 4  Help from another person taking care of personal grooming? 3  Help from another person toileting, which includes using toliet, bedpan, or urinal? 2  Help from another person bathing (including washing, rinsing, drying)? 2  Help from another person to put on and taking off regular upper body clothing? 3  Help from another person to put on and taking off regular lower body clothing? 2  6 Click Score 16  OT Goal Progression  Progress towards OT goals Progressing toward goals  OT Time Calculation  OT Start Time (ACUTE ONLY) 1256  OT Stop Time (ACUTE ONLY) 1304  OT Time Calculation (min) 8 min  OT General Charges  $OT Visit 1 Visit  OT Treatments  $Therapeutic Exercise 8-22 mins  Chandan Fly C.,  OTR/L Acute Rehabilitation Services Pager [phone] Office [phone]

## 2020-04-02 NOTE — Consult Note (Addendum)
NEURO HOSPITALIST CONSULT NOTE   Requesting physician: Dr. Maisie Fus  Reason for Consult: BL LLE Weakness  History obtained from: Patient, chart  HPI:                                                                                                                                          Nancy [name] is an 34 y.o. female with a past medical history significant for bipolar disorder, morbid obesity, diabetes mellitus type II, hypertension, substance abuse who was brought to [facility] yesterday with a four-week history of left upper extremity and bilateral lower extremity pain and weakness and urinary incontinence. Approximately four weeks ago, Nancy [name] woke up with pain that began at her neck and tracked down the antero-lateral aspect of her arm, where is became a pain "inside" her forearm. In addition to the pain, she stated that she had no function of her wrist or hand, stating that she thought it was "asleep". At this time she went to [facility] ED and was worked up for stroke - workup was negative - and she returned home. The day before her appointment with [facility], Nancy [name] woke with her left leg feeling like it had "a cement block" on it, and the same feeling came over the right leg on the following day. This was accompanied by an "intense" numbness and pain that was worse in the left vs the right leg. Nancy [name] is unable to pinpoint where her legs hurt. Nancy [name] takes oxycodone at home (TID) for pain, she is a 2-pack/day smoker.  Urge urinary incontinence has occurred three times per Nancy [name]' history. She denies any changes in bowel movements, no stool incontinence.  Approximately four weeks ago she and her husband moved to [place] from [place], [place] to be closer to her mother, but no other life events occurred. Nancy [name]' nurse was told that at this time she lost contact with her counselor/therapist and has not yet found a new one. There has been no recent  infection, illness.  Nancy [name] stated that there was no personal or family history of psychiatric disease or mental health concerns but per chart review she had been diagnosed with bipolar disorder at the age of 52 and has also been diagnosed with major depressive disorder with suicide attempt.  Per chart review she has had episodes of LLE weakness dating back to 2013 with no known neurological cause. Working diagnosis at that time was conversion disorder.    Pertinent Medications Flexeril 10mg  TID Lasix 40mg  QD Glipizide 5mg  BID Dilaudid q2h PRN pain Insulin SS Nicotine patch Lyrica 300mg  BID  Pertinent Imaging/Diagnostics: 04Sep2021:    MR Brain: Normal    MR C-Spine: Normal    MR T-Spine: Normal    MR L-Spine: Normal  Past Medical History:  Diagnosis Date  . Anxiety   . Bipolar disorder (HCC)   . CHF (congestive heart failure) (HCC)   . CIN I (cervical intraepithelial neoplasia I)   . Diabetes mellitus without complication (HCC)   . Enlarged heart   . Nephrolithiasis   . OSA (obstructive sleep apnea)   . Tachycardia     Past Surgical History:  Procedure Laterality Date  . BLADDER AUGMENTATION  2001   enlargement  . DILITATION & CURRETTAGE/HYSTROSCOPY WITH THERMACHOICE ABLATION N/A 07/06/2013   Procedure: DILATATION & CURETTAGE/HYSTEROSCOPY WITH THERMACHOICE ABLATION;  Surgeon: Tilda Burrow, MD;  Location: [facility];  Service: Gynecology;  Laterality: N/A;  total therapt time- 9 minutes 2 seconds; 87C  . LAPAROSCOPIC BILATERAL SALPINGECTOMY Bilateral 07/06/2013   Procedure: LAPAROSCOPIC BILATERAL SALPINGECTOMY;  Surgeon: Tilda Burrow, MD;  Location: [facility];  Service: Gynecology;  Laterality: Bilateral;    Family History  Problem Relation Age of Onset  . Hypertension Mother   . Depression Mother   . Diabetes Mother   . Cancer Mother        brain tumor  . Hypertension Father   . Diabetes Father   . Depression Father   . Hypertension Sister   . Diabetes  Sister   . Depression Sister   . Depression Brother   . Diabetes Brother   . Hypertension Brother   . Cancer Maternal Aunt   . Cancer Paternal Aunt   . Hypertension Maternal Grandmother   . Diabetes Maternal Grandmother   . Depression Maternal Grandmother   . Hypertension Maternal Grandfather   . Diabetes Maternal Grandfather   . Depression Maternal Grandfather   . Hypertension Paternal Grandmother   . Diabetes Paternal Grandmother   . Depression Paternal Grandmother   . Hypertension Paternal Grandfather   . Diabetes Paternal Grandfather   . Depression Paternal Grandfather     Social History:  reports that she has been smoking cigarettes. She has been smoking about 1.50 packs per day. She has never used smokeless tobacco. She reports current alcohol use. She reports that she does not use drugs.  Allergies  Allergen Reactions  . Asa Buff (Mag [Buffered Aspirin] Anaphylaxis, Shortness Of Breath, Swelling and Other (See Comments)    "Made my throat become swollen to the point of closing"  . Aspirin Anaphylaxis, Shortness Of Breath, Swelling and Other (See Comments)    "Made my throat become swollen to the point of closing"  . Tolmetin Shortness Of Breath and Other (See Comments)    TOLECTIN (tolmetin sodium) is indicated for the relief of signs and symptoms of rheumatoid arthritis and osteoarthritis  . Prozac [Fluoxetine Hcl] Other (See Comments)    Reaction not recalled    MEDICATIONS:                                                                                                                   Current Meds  Medication Sig  . chlorthalidone (HYGROTON) 25 MG tablet Take 25 mg by  mouth See admin instructions. Take 25 mg by mouth two times a day as long as the Systolic number is 140 or greater  . cyclobenzaprine (FLEXERIL) 10 MG tablet Take 10 mg by mouth in the morning, at noon, and at bedtime.   . diclofenac Sodium (VOLTAREN) 1 % GEL Apply 2-8 g topically See admin  instructions. Apply 2-8 grams to each leg nightly  . EPINEPHrine 0.3 mg/0.3 mL IJ SOAJ injection Inject 0.3 mg into the muscle as needed for anaphylaxis.   Marland Kitchen etodolac (LODINE XL) 600 MG 24 hr tablet Take 600 mg by mouth daily at 12 noon.   . furosemide (LASIX) 40 MG tablet Take 40 mg by mouth in the morning.   Marland Kitchen glipiZIDE (GLUCOTROL) 5 MG tablet Take 5 mg by mouth 2 (two) times daily before a meal.   . insulin glargine (LANTUS) 100 UNIT/ML injection Inject 0.1 mLs (10 Units total) into the skin at bedtime. (Patient taking differently: Inject 40 Units into the skin at bedtime. )  . lisinopril (ZESTRIL) 2.5 MG tablet Take 2.5 mg by mouth daily.  . Oxycodone HCl 20 MG TABS Take 20 mg by mouth in the morning, at noon, and at bedtime.   . potassium chloride SA (KLOR-CON M20) 20 MEQ tablet Take 20 mEq by mouth daily.  . pregabalin (LYRICA) 300 MG capsule Take 300 mg by mouth 2 (two) times daily.  Marland Kitchen topiramate (TOPAMAX) 50 MG tablet Take 50 mg by mouth in the morning.     Review Of Systems:                                                                                                           History obtained from the patient  General: Negative for chills, fever, weight gain or weight loss Psychological: PMH includes bipolar disorder and major depressive disorder with suicide attempt Ophthalmic: Negative for blurry or double vision ENT: Negative for abrupt loss of hearing, vertigo Respiratory: Negative for cough Cardiovascular: Negative for chest pain, positive LL edema bilaterally Gastrointestinal: Negative for abdominal pain, diarrhea, nausea/vomiting or stool incontinence Genito-Urinary: Positive urge urinary incontinence. Neurological: As noted in HPI Dermatological: Numbness in the left upper extremity and bilateral lower extremities  Blood pressure 126/63, pulse 89, temperature 98.4 F (36.9 C), temperature source Oral, resp. rate 20, SpO2 98 %.   Physical Examination:                                                                                                       General: Well developed, morbidly obese female. Appears calm and comfortable in bed.  HEENT:  Normocephalic, no lesions. Normal  external eye, ears and nose. Normal pharynx. Cardiovascular: RRR, pulses palpable throughout   Pulmonary: Breathing comfortably on room air Abdomen: Soft, non-tender3 Extremities: Lower extremity edema. Musculoskeletal: No atrophy noted. Weakness as noted in neuro exam Skin: warm and dry, no hyperpigmentation, vitiligo, or suspicious lesions  Neurological Examination:                                                                                               Mental Status: Nancy [name] is alert, oriented x 4, thought content appropriate.  Speech fluent without evidence of aphasia. Able to follow 3-step commands without difficulty. Cranial Nerves: II: Visual fields grossly normal, pupils are equal, round, reactive to light. III,IV, VI: Ptosis not present, extra-ocular muscle movements intact bilaterally V,VII: Smile and eyebrow raise is symmetric. Facial light touch intact bilaterally VIII: Hearing grossly intact IX,X: Uvula and palate rise symmetrically XI: SCM and bilateral shoulder shrug strength 5/5 XII: Midline tongue extension Motor: Inconsistent findings; noted below is max strength.  Shoulder/elbow/hip muscles: Right :     Upper extremity   5/5   Left:     Upper extremity   5/5          Lower extremity   4/5    Lower extremity   4/5 Wrist/hand/ankle muscles: Right :     Upper extremity   4/5   Left:     Upper extremity   3/5          Lower extremity   3/5 (no effort)   Lower extremity   3/5 (no effort) Pronator drift not present Sensory: Light touch intact throughout arms bilaterally and right forearm. No sensation the distal to the elbow on the left side. No sensation distal to the knee bilaterally. Cool sensation felt at the knee and proximal. Deep Tendon Reflexes:  2+ and symmetric throughout upper extremities and knee jerk. Dropped ankle reflex. Plantars: Right: downgoing   Left: downgoing Cerebellar: Finger-to-nose test without evidence of dysmetria or ataxia on the right. Coordinated movement to complete the task on the left was attempted. No extension of the left wrist or finger.  Gait: Not tested   Lab Results: Basic Metabolic Panel: Recent Labs  Lab 04/01/20 1300 04/01/20 1635  NA 134* 133*  K 4.3 4.4  CL 99 99  CO2 25 25  GLUCOSE 372* 453*  BUN 10 10  CREATININE 0.67 0.73  CALCIUM 9.3 9.1    Liver Function Tests: Recent Labs  Lab 04/01/20 1635  AST 20  ALT 19  ALKPHOS 82  BILITOT 0.4  PROT 7.0  ALBUMIN 3.2*   No results for input(s): LIPASE, AMYLASE in the last 168 hours. No results for input(s): AMMONIA in the last 168 hours.  CBC: Recent Labs  Lab 04/01/20 1300 04/01/20 1635  WBC 6.9 7.4  NEUTROABS  --  2.7  HGB 13.5 13.2  HCT 42.1 41.3  MCV 88.1 86.0  PLT 225 227    Cardiac Enzymes: No results for input(s): CKTOTAL, CKMB, CKMBINDEX, TROPONINI in the last 168 hours.  Lipid Panel: No results for input(s): CHOL, TRIG, HDL, CHOLHDL, VLDL, LDLCALC in  the last 168 hours.  CBG: Recent Labs  Lab 04/01/20 1243 04/01/20 1920 04/01/20 2102 04/02/20 0617 04/02/20 1154  GLUCAP 358* 407* 353* 337* 311*    Microbiology: Results for orders placed or performed during the hospital encounter of 06/18/15  Urine culture     Status: None   Collection Time: 06/18/15 10:14 PM   Specimen: Urine, Clean Catch  Result Value Ref Range Status   Specimen Description URINE, CLEAN CATCH  Final   Special Requests NONE  Final   Culture   Final    1,000 COLONIES/mL INSIGNIFICANT GROWTH Performed at [facility]    Report Status 06/20/2015 FINAL  Final    Coagulation Studies: Recent Labs    04/01/20 1635  LABPROT 12.8  INR 1.0    Imaging: MR BRAIN W WO CONTRAST  Result Date: 04/02/2020 CLINICAL DATA:   Encephalopathy.  Bilateral lower extremity weakness. EXAM: MRI HEAD WITHOUT AND WITH CONTRAST TECHNIQUE: Multiplanar, multiecho pulse sequences of the brain and surrounding structures were obtained without and with intravenous contrast. CONTRAST:  30mL GADAVIST GADOBUTROL 1 MMOL/ML IV SOLN COMPARISON:  None. FINDINGS: Brain: No acute infarct, acute hemorrhage or extra-axial collection. Normal white matter signal. Normal volume of CSF spaces. No chronic microhemorrhage. Normal midline structures. Vascular: Normal flow voids. Skull and upper cervical spine: Normal marrow signal. Sinuses/Orbits: Negative. Other: None. IMPRESSION: Normal brain MRI. Electronically Signed   By: Deatra Robinson M.D.   On: 04/02/2020 00:16   MR CERVICAL SPINE WO CONTRAST  Result Date: 04/02/2020 CLINICAL DATA:  Bilateral lower extremity weakness EXAM: MRI CERVICAL SPINE WITHOUT CONTRAST TECHNIQUE: Multiplanar, multisequence MR imaging of the cervical spine was performed. No intravenous contrast was administered. COMPARISON:  None. FINDINGS: Alignment: Physiologic.  Straightening of normal cervical lordosis. Vertebrae: No fracture, evidence of discitis, or bone lesion. Cord: Normal signal and morphology. Posterior Fossa, vertebral arteries, paraspinal tissues: Negative. Disc levels: No disc herniation or stenosis. IMPRESSION: Straightening of normal cervical lordosis, which may be positional or due to muscle spasm. Otherwise normal cervical spine MRI. Electronically Signed   By: Deatra Robinson M.D.   On: 04/02/2020 00:32   MR THORACIC SPINE WO CONTRAST  Result Date: 04/02/2020 CLINICAL DATA:  Difficulty walking. EXAM: MRI THORACIC SPINE WITHOUT CONTRAST TECHNIQUE: Multiplanar, multisequence MR imaging of the thoracic spine was performed. No intravenous contrast was administered. COMPARISON:  None. FINDINGS: Alignment:  Physiologic. Vertebrae: No fracture, evidence of discitis, or bone lesion. Cord:  Normal signal and morphology.  Paraspinal and other soft tissues: Negative. Disc levels: No spinal canal stenosis. IMPRESSION: Normal thoracic spine MRI. Electronically Signed   By: Deatra Robinson M.D.   On: 04/02/2020 00:38   MR Lumbar Spine W Wo Contrast  Result Date: 04/02/2020 CLINICAL DATA:  Encephalopathy and difficulty walking EXAM: MRI LUMBAR SPINE WITHOUT AND WITH CONTRAST TECHNIQUE: Multiplanar and multiecho pulse sequences of the lumbar spine were obtained without and with intravenous contrast. CONTRAST:  60mL GADAVIST GADOBUTROL 1 MMOL/ML IV SOLN COMPARISON:  None. FINDINGS: Segmentation:  Standard. Alignment:  Physiologic. Vertebrae:  No fracture, evidence of discitis, or bone lesion. Conus medullaris and cauda equina: Conus extends to the L1 level. Conus and cauda equina appear normal. Paraspinal and other soft tissues: Negative. Disc levels: No disc herniation, spinal canal stenosis or neural impingement. No abnormal contrast enhancement. IMPRESSION: Normal MRI of the lumbar spine. Electronically Signed   By: Deatra Robinson M.D.   On: 04/02/2020 00:50     Thank you for consulting the [facility]  team. Assessment and plan per attending neurologist.   Bruna Potter PA-C [facility]  04/02/2020, 2:22 PM  Assessment and Plan: Nancy [name] is a 34 y.o. female  with a past medical history significant for bipolar disorder, morbid obesity, diabetes mellitus type II poorly controlled with HbA1c of 14, hypertension, substance abuse who was brought in with a four-week history of left upper extremity and a distinct episode of bilateral lower extremity pain and weakness and urinary incontinence, 2 days ago with persistent symptoms. Endorses that symptoms started in her feet, then moved up her legs. Exam with no respiratory distress, no cranial nerve deficit, symmetric BL lower extremities weakness with intact and symmetric reflexes in biceps, triceps, brachioradialis and patellar, absent reflexes at ankles.  MIR Brain, C, T , L spine with no cord compression, no brainstem stroke, no transverse myelitis. I do not see any enhancement of the lumbar nerve roots. I do have some concern that her weakness may be effort dependent but I am not entirely convinced and functional neurological deficit is a diagnosis of exclusion.   Given her distinct lower extremity weakness, we will recommend LP with CSF cell count, differential, protein and glucose to eval for GBS. [place] can also present as a GBS mimic, will test for [place].  Recs: - Recommend CSF cell count, differential, protein and glucose to assess for albuminocytologic dissociation. [place] IgM and IgG - given high BMI, recommend LP under fluoro - Can get outpatient EMG if above is non revealing.   Erick Blinks [facility] Pager Number [phone]

## 2020-04-02 NOTE — Progress Notes (Signed)
Inpatient Rehab Admissions Coordinator Note:   Per therapy recommendations, pt was screened for CIR candidacy by Megan Salon, MS CCC-SLP. At this time, Pt. Remains on observation status and is not a candidate for CIR. Will continue to follow for progress if admitted and once neuro work up is complete and enter consult for inpatient rehab if Pt. Appears to be an appropriate candidate at that time.   Megan Salon, MS, CCC-SLP Rehab Admissions Coordinator  [phone] (celll) [phone] (office)

## 2020-04-02 NOTE — Evaluation (Signed)
Physical Therapy Evaluation Patient Details Name: Nancy [name] MRN: [id number] DOB: [birth year] Today's Date: 04/02/2020   History of Present Illness  This 34 y.o. female admitted for evaluation of neck pain, and Lt UE weakness, and bil. LE weakness as well as accompanying neck pain and urinary incontinence. MRI of brain, cervical, thoracic, and lumbar spine negative for acute lesions.  Work up underway.  PMH includes.  Bipolar disorder, episodic opiate abuse, Major depressive disorder, h/o suicide attempt, sedative, hypnotic, or anxiolytic abuse disorder, LE edema, morbid obesity, h/o paraparesis 2013, DM, OSA  Clinical Impression   Pt admitted with above symptoms and currently with functional limitations due to the deficits listed below (see PT Problem List). She reports frequent falls at home due to LE weakness. Able to stand with RW and +2 min assist (for safety) however unsafe to progress to ambulation duet to exhibiting trunk weakness with truncal sway. Discussed current high fall risk (and multiple recent falls) and potential need for education in use of wheelchair for safe locomotion. Emphasized still want her to work on standing activities and gait when assisted by staff. Discussed recommendation for continued residential post-acute therapy and pt in agreement. Pt will benefit from skilled PT to increase their independence and safety with mobility to allow discharge to the venue listed below.       Follow Up Recommendations CIR    Equipment Recommendations  Wheelchair (measurements PT);Wheelchair cushion (measurements PT);Rolling walker with 5" wheels    Recommendations for Other Services Rehab consult     Precautions / Restrictions Precautions Precautions: Fall Precaution Comments: reports falls "a lot" at home; describes knees buckling      Mobility  Bed Mobility Overal bed mobility: Needs Assistance Bed Mobility: Supine to Sit     Supine to sit: Mod assist     General  bed mobility comments: pt using RUE to assist legs toward EOB; assist to get LLE off EOB and initiate raising torso (with HOB elevated 30 degrees)  Transfers Overall transfer level: Needs assistance Equipment used: Rolling walker (2 wheeled) Transfers: Sit to/from Stand Sit to Stand: Min assist;+2 physical assistance         General transfer comment: initiated with stedy due to bil LE weakness; pt able to come to stand without bracing lower legs against support pad of stedy; vc for ful upright posture (tends to look downward because she can't feel where her feet are; stood ~60 seconds with lateral weight shifting and lifting each foot (pre-gait); as fatigued noted truncal sway; second stand with RW with pre-gait and then side-stepping to her rt along EOB x 2 ft  Ambulation/Gait             General Gait Details: not tested as no chair to close follow with and h/o multiple falls with signficant bil LE weakness  Stairs            Wheelchair Mobility    Modified Rankin (Stroke Patients Only)       Balance Overall balance assessment: Needs assistance Sitting-balance support: Feet supported Sitting balance-Leahy Scale: Good     Standing balance support: Single extremity supported;During functional activity Standing balance-Leahy Scale: Poor Standing balance comment: requires UE support and min A                              Pertinent Vitals/Pain Pain Assessment: 0-10 Pain Score: 7  Pain Location: Lt shoulder and bil. LEs  Pain Descriptors /  Indicators: Heaviness;Guarding;Sharp;Stabbing Pain Intervention(s): Limited activity within patient's tolerance;Monitored during session;Premedicated before session;Repositioned    Home Living Family/patient expects to be discharged to:: Private residence Living Arrangements: Spouse/significant other Available Help at Discharge: Family;Available 24 hours/day Type of Home: Other(Comment) (townhouse ) Home Access:  Stairs to enter   [employer] of Steps: 1 Home Layout: Two level;Bed/bath upstairs;1/2 bath on main level Home Equipment: Cane - single point Additional Comments: She reports she has been sleeping on the couch     Prior Function Level of Independence: Needs assistance   Gait / Transfers Assistance Needed: Pt reports she has been ambulating with SPC, but has been having fequent falls.  Spouse helps her with ambulation   ADL's / Homemaking Assistance Needed: She reports spouse has been having to assist her with LB ADLs at home for the past month.  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  She only tolerates ROM to ~100* due to shoulder pain.  Significant tightness of levator and upper traps noted;  Elbow 3-/5; wrist 1/5; fingers grossly 1-2/5 - difficult to accurately assess as she doesn't move fingers on command, but spontaneously squeezed therapist's hand, and was able to place earpods in their case with Lt hand  LUE Sensation: decreased light touch (per pt report ) LUE Coordination: decreased fine motor;decreased gross motor    Lower Extremity Assessment Lower Extremity Assessment: RLE deficits/detail;LLE deficits/detail RLE Deficits / Details: seated: knee extension 3+; knee flexion 2+; ankle DF 3+; functionally demonstrates ability to stand with min assist, weight shift and lift each foot off the floor via dorsiflexion) RLE Sensation:  (describes heaviness, numbness (lower legs)) LLE Deficits / Details: seated: knee extension 3-; knee flexion 2; ankle DF 3- LLE Sensation:  (describes heaviness, numbness (lower legs))     Cervical / Trunk Assessment Cervical / Trunk Assessment: Other exceptions Cervical / Trunk Exceptions: +trunk sway in standing (anterior-posterior)  Communication   Communication: No difficulties  Cognition Arousal/Alertness: Awake/alert Behavior During Therapy: WFL for tasks assessed/performed Overall Cognitive Status: Within Functional Limits for tasks assessed                                        General Comments General comments (skin integrity, edema, etc.): Pt inquired re: getting rollator and described higher fall risk with rollator due to hand grip positioned positioned posterior to center of mass of walker AND not designed for sitting on rollator and rolling around like a wheelchair    Exercises     Assessment/Plan    PT Assessment Patient needs continued PT services  PT Problem List Decreased strength;Decreased balance;Decreased mobility;Decreased knowledge of use of DME;Impaired sensation;Obesity;Pain       PT Treatment Interventions DME instruction;Gait training;Stair training;Functional mobility training;Therapeutic activities;Therapeutic exercise;Balance training;Neuromuscular re-education;Patient/family education    PT Goals (Current goals can be found in the Care Plan section)  Acute Rehab PT Goals Patient Stated Goal: to find out why I am like this  PT Goal Formulation: With patient Time For Goal Achievement: 04/16/20 Potential to Achieve Goals: Fair (unclear diagnosis)  Frequency Min 3X/week   Barriers to discharge        Co-evaluation               AM-PAC PT "6 Clicks" Mobility  Outcome Measure Help needed turning from your back to your side while in a flat bed without using bedrails?: A Lot Help needed moving from lying on your back to sitting on the side of a flat bed without using bedrails?: A Lot Help needed moving to and from a bed to a chair (including a wheelchair)?: A Lot Help needed standing up from a chair using  your arms (e.g., wheelchair or bedside chair)?: A Little Help needed to walk in hospital room?: A Lot Help needed climbing 3-5 steps with a railing? : Total 6 Click Score: 12    End of Session Equipment Utilized During Treatment: Gait belt Activity Tolerance: Patient tolerated treatment well Patient left: in bed;with call bell/phone within reach;with bed alarm set;with SCD's reapplied Nurse Communication: Mobility status PT Visit Diagnosis: Other abnormalities of gait and mobility (R26.89);Repeated falls (R29.6);Muscle weakness (generalized) (M62.81);Other symptoms and signs involving the nervous system (R29.898)    Time: 3762-8315 PT Time Calculation (min) (ACUTE ONLY): 34 min   Charges:   PT Evaluation $PT Eval Moderate Complexity: 1 Mod PT Treatments $Gait Training: 8-22 mins         [name], PT Pager [phone]   [name] 04/02/2020, 1:11 PM

## 2020-04-02 NOTE — Progress Notes (Signed)
   Providing Compassionate, Quality Care - Together  NEUROSURGERY PROGRESS NOTE   S: No issues overnight. States her strength is slightly worse this am  O: EXAM:  BP 118/76 (BP Location: Right Arm)   Pulse 88   Temp 98.5 F (36.9 C) (Oral)   Resp 20   SpO2 100%   Awake, alert, oriented  Speech fluent, appropriate  CNs grossly intact  Non dermatomal dec sensation on LUE and BLE to light touch LUE delt 1/5, bi 2/5, tri 2/5, WE 0/5, grip 2/5 LLE HF 2/5, KE 2/5, DF/PF 3/5 RLE HF 3/5, KE 3/5, DP/PF 3/5 RUE 4+/5 throughout Dec sen to light touch in BLE, non dermatomal   ASSESSMENT:  34 y.o. female with  L sided weakness, BLE weakness  ? etiology  PLAN: - MRI of whole neuro axis neg for acute lesion whatsoever - no nsx intervention at this time - will consult neurology for further w/u - continue PT/OT, may need rehab    Thank you for allowing me to participate in this patient's care.  Please do not hesitate to call with questions or concerns.   Monia Pouch, DO Neurosurgeon [facility] Cell: [phone]

## 2020-04-02 NOTE — Evaluation (Signed)
Occupational Therapy Evaluation Patient Details Name: Nancy [name] MRN: [id number] DOB: May 25, 1986 Today's Date: 04/02/2020    History of Present Illness This 34 y.o. female admitted for evaluation of neck pain, and Lt UE weakness, and bil. LE weakness as well as accompanying neck pain and urinary incontinence. MRI of brain, cervical, thoracic, and lumbar spine negative for acute lesions.  Work up underway.  PMH includes.  Bipolar disorder, episodic opiate abuse, Major depressive disorder, h/o suicide attempt, sedative, hypnotic, or anxiolytic abuse disorder, LE edema, morbid obesity, h/o paraparesis 2013, DM, OSA   Clinical Impression   Pt admitted with above. She demonstrates the below listed deficits and will benefit from continued OT to maximize safety and independence with BADLs.  Pt presents to OT with increased pain, decreased use and ROM Lt UE, bil. LE weakness - inconsistencies noted throughout session.  She currently requires set up to mod A for ADLs and functional transfers with RW.  She endorses significant stress in her personal life.  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  Feel she may need post acute rehab if spouse unable to provide necessary level of assist.  Recommend psych consult.  Will follow acutely.       Follow Up Recommendations  SNF;Supervision/Assistance - 24 hour    Equipment Recommendations  3 in 1 bedside commode (pt would like a rollator )    Recommendations for Other Services       Precautions / Restrictions Precautions Precautions: Fall Precaution Comments: reports h/o falls at home       Mobility Bed Mobility Overal bed mobility: Needs Assistance Bed Mobility: Supine to Sit     Supine to sit: Mod assist     General bed mobility comments: assist to move LEs off bed.  Pt at times resisting movement  of LEs   Transfers Overall transfer level: Needs assistance Equipment used: Rolling walker (2 wheeled) Transfers: Sit to/from Stand Sit to Stand: Mod assist         General transfer comment: requires momentum and asisst to boost buttocks and for balance     Balance Overall balance assessment: Needs assistance Sitting-balance support: Feet supported Sitting balance-Leahy Scale: Good     Standing balance support: Single extremity supported;During functional activity Standing balance-Leahy Scale: Poor Standing balance comment: requires UE support and min A                            ADL either performed or assessed with clinical judgement   ADL Overall ADL's : Needs assistance/impaired Eating/Feeding: Modified independent;Bed level   Grooming: Wash/dry hands;Wash/dry face;Oral care;Set up;Sitting Grooming Details (indicate cue type and reason): using Rt UE as dominant  Upper Body Bathing: Set up;Supervision/ safety;Sitting   Lower Body Bathing: Moderate assistance;Sit to/from stand Lower Body Bathing Details (indicate cue type and reason): using Rt UE as dominant  Upper Body Dressing : Minimal assistance;Sitting   Lower Body Dressing: Maximal assistance;Sit to/from stand Lower Body Dressing Details (indicate cue type and reason): able to thead underpants over feet, and pull up to knees.  She requires assist pulling pants over Lt hip, and assist with socks  Toilet Transfer: Moderate assistance;RW Toilet Transfer Details (indicate cue type and reason): assist to sit to stand and for transfer  Toileting- Clothing Manipulation and Hygiene: Moderate assistance;Sit to/from  stand       Functional mobility during ADLs: Moderate assistance;Rolling walker       Vision Baseline Vision/History: Wears glasses Wears Glasses: At all times Patient Visual Report: No change from baseline Vision Assessment?: No apparent visual deficits     Perception Perception Perception  Tested?: Yes   Praxis Praxis Praxis tested?: Within functional limits    Pertinent Vitals/Pain Pain Assessment: 0-10 Pain Score: 7  Pain Location: Lt shoulder and bil. LEs  Pain Descriptors / Indicators: Heaviness;Guarding;Sharp;Stabbing Pain Intervention(s): Limited activity within patient's tolerance;Monitored during session;Repositioned;Patient requesting pain meds-RN notified     Hand Dominance Left   Extremity/Trunk Assessment Upper Extremity Assessment Upper Extremity Assessment: LUE deficits/detail LUE Deficits / Details: Pt demonstrates Lt shoulder grossly 1/5, but is able to resist movement during AAROM.  She only tolerates ROM to ~100* due to shoulder pain.  Significant tightness of levator and upper traps noted;  Elbow 3-/5; wrist 1/5; fingers grossly 1-2/5 - difficult to accurately assess as she doesn't move fingers on command, but spontaneously squeezed therapist's hand, and was able to place earpods in their case with Lt hand  LUE Sensation: decreased light touch (per pt report ) LUE Coordination: decreased fine motor;decreased gross motor   Lower Extremity Assessment Lower Extremity Assessment: Defer to PT evaluation   Cervical / Trunk Assessment Cervical / Trunk Assessment: Normal (no weakness noted during functional activities )   Communication Communication Communication: No difficulties   Cognition Arousal/Alertness: Awake/alert Behavior During Therapy: Anxious Overall Cognitive Status: Within Functional Limits for tasks assessed                                     General Comments  Pt endorses significant stressors in her personal life, reports she used to see a counselor, but hasn't seen anyone for years, and instead smokes to reduce stress - encouraged her to stop smoking and identify alternate methods for stress relief     Exercises     Shoulder Instructions      Home Living Family/patient expects to be discharged to:: Private  residence Living Arrangements: Spouse/significant other Available Help at Discharge: Family;Available 24 hours/day Type of Home: Other(Comment) (townhouse ) Home Access: Stairs to enter [employer] of Steps: 1   Home Layout: Two level;Bed/bath upstairs;1/2 bath on main level Alternate Level Stairs-Number of Steps: 16       Bathroom Toilet: Handicapped height     Home Equipment: Cane - single point   Additional Comments: She reports she has been sleeping on the couch       Prior Functioning/Environment Level of Independence: Needs assistance  Gait / Transfers Assistance Needed: Pt reports she has been ambulating with SPC, but has been having fequent falls.  Spouse helps her with ambulation  ADL's / Homemaking Assistance Needed: She reports spouse has been having to assist her with LB ADLs at home for the past month.  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 Problem List: Decreased strength;Decreased activity tolerance;Decreased range of motion;Impaired balance (sitting and/or standing);Decreased coordination;Decreased safety awareness;Decreased knowledge of use of DME or AE;Obesity;Impaired UE functional use;Pain;Increased edema      OT Treatment/Interventions: Self-care/ADL training;Therapeutic exercise;Neuromuscular education;Energy conservation;DME and/or AE instruction;Manual therapy;Therapeutic activities;Patient/family education;Balance training    OT Goals(Current goals can be found in the care plan section) Acute Rehab  OT Goals Patient Stated Goal: to find out why I am like this  OT Goal Formulation: With patient Time For Goal Achievement: 04/16/20 Potential to Achieve Goals: Good ADL Goals Pt Will Perform Grooming: with min assist;standing Pt Will Perform Upper Body Bathing: with set-up;sitting Pt Will Perform Lower Body Bathing: with min assist;with adaptive equipment;sit to/from stand Pt Will  Perform Upper Body Dressing: with set-up;sitting Pt Will Perform Lower Body Dressing: with min assist;sit to/from stand;with adaptive equipment Pt Will Transfer to Toilet: with min assist;ambulating;regular height toilet;bedside commode;grab bars Pt Will Perform Toileting - Clothing Manipulation and hygiene: with min assist;sit to/from stand Pt/caregiver will Perform Home Exercise Program: Increased strength;Increased ROM;Left upper extremity;With Supervision;With written HEP provided  OT Frequency: Min 2X/week   Barriers to D/C: Decreased caregiver support  unsure if spouse can provide necessary level of assistance        Co-evaluation              AM-PAC OT "6 Clicks" Daily Activity     Outcome Measure Help from another person eating meals?: None Help from another person taking care of personal grooming?: A Little Help from another person toileting, which includes using toliet, bedpan, or urinal?: A Lot Help from another person bathing (including washing, rinsing, drying)?: A Lot Help from another person to put on and taking off regular upper body clothing?: A Little Help from another person to put on and taking off regular lower body clothing?: A Lot 6 Click Score: 16   End of Session Equipment Utilized During Treatment: Gait belt;Rolling walker Nurse Communication: Mobility status;Patient requests pain meds  Activity Tolerance: Patient tolerated treatment well Patient left: in bed;with call bell/phone within reach;with nursing/sitter in room  OT Visit Diagnosis: Unsteadiness on feet (R26.81);Pain Pain - Right/Left: Left Pain - part of body: Shoulder;Arm;Knee;Leg;Ankle and joints of foot                Time: 3382-5053 OT Time Calculation (min): 62 min Charges:  OT General Charges $OT Visit: 1 Visit OT Evaluation $OT Eval Moderate Complexity: 1 Mod OT Treatments $Self Care/Home Management : 38-52 mins  Eber Jones., OTR/L Acute Rehabilitation Services Pager  [phone] Office [phone]   Jeani Hawking M 04/02/2020, 10:31 AM

## 2020-04-03 ENCOUNTER — Encounter (HOSPITAL_COMMUNITY): Payer: Self-pay | Admitting: Neurosurgery

## 2020-04-03 ENCOUNTER — Observation Stay (HOSPITAL_COMMUNITY)

## 2020-04-03 DIAGNOSIS — E119 Type 2 diabetes mellitus without complications: Secondary | ICD-10-CM | POA: Diagnosis not present

## 2020-04-03 DIAGNOSIS — Z833 Family history of diabetes mellitus: Secondary | ICD-10-CM | POA: Diagnosis not present

## 2020-04-03 DIAGNOSIS — Z20822 Contact with and (suspected) exposure to covid-19: Secondary | ICD-10-CM | POA: Diagnosis present

## 2020-04-03 DIAGNOSIS — F3130 Bipolar disorder, current episode depressed, mild or moderate severity, unspecified: Secondary | ICD-10-CM | POA: Diagnosis not present

## 2020-04-03 DIAGNOSIS — Z6841 Body Mass Index (BMI) 40.0 and over, adult: Secondary | ICD-10-CM | POA: Diagnosis not present

## 2020-04-03 DIAGNOSIS — I110 Hypertensive heart disease with heart failure: Secondary | ICD-10-CM | POA: Diagnosis present

## 2020-04-03 DIAGNOSIS — G610 Guillain-Barre syndrome: Secondary | ICD-10-CM | POA: Diagnosis present

## 2020-04-03 DIAGNOSIS — I5032 Chronic diastolic (congestive) heart failure: Secondary | ICD-10-CM | POA: Diagnosis present

## 2020-04-03 DIAGNOSIS — E1142 Type 2 diabetes mellitus with diabetic polyneuropathy: Secondary | ICD-10-CM | POA: Diagnosis present

## 2020-04-03 DIAGNOSIS — E1165 Type 2 diabetes mellitus with hyperglycemia: Secondary | ICD-10-CM | POA: Diagnosis present

## 2020-04-03 DIAGNOSIS — Z79899 Other long term (current) drug therapy: Secondary | ICD-10-CM | POA: Diagnosis not present

## 2020-04-03 DIAGNOSIS — F17210 Nicotine dependence, cigarettes, uncomplicated: Secondary | ICD-10-CM | POA: Diagnosis present

## 2020-04-03 DIAGNOSIS — Z79891 Long term (current) use of opiate analgesic: Secondary | ICD-10-CM | POA: Diagnosis not present

## 2020-04-03 DIAGNOSIS — Z794 Long term (current) use of insulin: Secondary | ICD-10-CM | POA: Diagnosis not present

## 2020-04-03 DIAGNOSIS — N3941 Urge incontinence: Secondary | ICD-10-CM | POA: Diagnosis present

## 2020-04-03 DIAGNOSIS — E871 Hypo-osmolality and hyponatremia: Secondary | ICD-10-CM | POA: Diagnosis present

## 2020-04-03 DIAGNOSIS — R29898 Other symptoms and signs involving the musculoskeletal system: Secondary | ICD-10-CM | POA: Diagnosis not present

## 2020-04-03 DIAGNOSIS — G894 Chronic pain syndrome: Secondary | ICD-10-CM | POA: Diagnosis present

## 2020-04-03 DIAGNOSIS — Z8249 Family history of ischemic heart disease and other diseases of the circulatory system: Secondary | ICD-10-CM | POA: Diagnosis not present

## 2020-04-03 DIAGNOSIS — F319 Bipolar disorder, unspecified: Secondary | ICD-10-CM | POA: Diagnosis present

## 2020-04-03 DIAGNOSIS — R531 Weakness: Secondary | ICD-10-CM | POA: Diagnosis not present

## 2020-04-03 LAB — SARS CORONAVIRUS 2 BY RT PCR (HOSPITAL ORDER, PERFORMED IN ~~LOC~~ HOSPITAL LAB): SARS Coronavirus 2: NEGATIVE

## 2020-04-03 LAB — GLUCOSE, CAPILLARY
Glucose-Capillary: 229 mg/dL — ABNORMAL HIGH (ref 70–99)
Glucose-Capillary: 238 mg/dL — ABNORMAL HIGH (ref 70–99)
Glucose-Capillary: 312 mg/dL — ABNORMAL HIGH (ref 70–99)

## 2020-04-03 NOTE — Plan of Care (Signed)

## 2020-04-03 NOTE — Progress Notes (Signed)
   Providing Compassionate, Quality Care - Together  NEUROSURGERY PROGRESS NOTE   S: No issues overnight.  O: EXAM:  BP 118/67 (BP Location: Right Arm)   Pulse 88   Temp 98.3 F (36.8 C) (Oral)   Resp 20   SpO2 98%   Awake, alert, oriented  Speech fluent, appropriate  CNs grossly intact  Non dermatomal dec sensation on LUE and BLE to light touch LUE delt 1/5, bi 2/5, tri 2/5, WE 0/5, grip 2/5 LLE HF 2/5, KE 2/5, DF/PF 3/5 RLE HF 3/5, KE 3/5, DP/PF 3/5 RUE 4+/5 throughout Motor strength appears to be effort related Dec sen to light touch in BLE, non dermatomal   ASSESSMENT:  34 y.o. female with  L sided weakness, BLE weakness  ? Etiology: conversion v GBS  PLAN: - MRI of whole neuro axis neg for acute lesion whatsoever - no nsx intervention at this time - neurology rec LP, pending today, will need EMG outpatient - continue PT/OT, may need rehab    Thank you for allowing me to participate in this patient's care.  Please do not hesitate to call with questions or concerns.   Monia Pouch, DO Neurosurgeon [facility] Cell: [phone]

## 2020-04-03 NOTE — Progress Notes (Signed)
Occupational Therapy Treatment Patient Details Name: Nancy [name] MRN: [id number] DOB: 1986-05-04 Today's Date: 04/03/2020    History of present illness This 34 y.o. female admitted for evaluation of neck pain, and Lt UE weakness, and bil. LE weakness as well as accompanying neck pain and urinary incontinence. MRI of brain, cervical, thoracic, and lumbar spine negative for acute lesions.  Work up underway.  PMH includes.  Bipolar disorder, episodic opiate abuse, Major depressive disorder, h/o suicide attempt, sedative, hypnotic, or anxiolytic abuse disorder, LE edema, morbid obesity, h/o paraparesis 2013, DM, OSA   OT comments  Pt reports no improvement in L shoulder pain with use of kinesiotape. Focus of session on L UE strengthening with gravity eliminated. Pt  stepped along EOB with RW and min assist. Pt stood from EOB with min guard assist and use of momentum. Continues to requires assist for LEs in/out of bed.  Follow Up Recommendations  CIR    Equipment Recommendations  3 in 1 bedside commode    Recommendations for Other Services      Precautions / Restrictions Precautions Precautions: Fall Precaution Comments: reports falls "a lot" at home; describes knees buckling       Mobility Bed Mobility Overal bed mobility: Needs Assistance Bed Mobility: Supine to Sit;Sit to Supine     Supine to sit: Min assist Sit to supine: Mod assist   General bed mobility comments: assist for L LE over EOB and B LEs back into bed  Transfers Overall transfer level: Needs assistance Equipment used: Rolling walker (2 wheeled) Transfers: Sit to/from Stand Sit to Stand: Min guard         General transfer comment: elevated bed, cues for R hand on bed, assisted L hand onto walker handle, use of momentum, required 2 attempts to stand    Balance Overall balance assessment: Needs assistance   Sitting balance-Leahy Scale: Good     Standing balance support: Bilateral upper extremity  supported Standing balance-Leahy Scale: Poor Standing balance comment: B UE support and min assist                           ADL either performed or assessed with clinical judgement   ADL Overall ADL's : Needs assistance/impaired                     Lower Body Dressing: Total assistance;Bed level Lower Body Dressing Details (indicate cue type and reason): socks Toilet Transfer: Minimal assistance;Stand-pivot;RW;BSC   Toileting- Clothing Manipulation and Hygiene: Moderate assistance;Sit to/from stand               Vision       Perception     Praxis      Cognition Arousal/Alertness: Awake/alert Behavior During Therapy: WFL for tasks assessed/performed Overall Cognitive Status: Within Functional Limits for tasks assessed                                          Exercises Other Exercises Other Exercises: Towel exercises L shoulder and elbow x 10, AAROM digits x 5, AAROM wrist extension with facilitation x 10  Other Exercises: active shoulder retraction and depression x 10, Kinesiotape still intact   Shoulder Instructions       General Comments      Pertinent Vitals/ Pain       Pain Assessment: No/denies pain Faces  Pain Scale: Hurts even more Pain Location: L shoulder Pain Descriptors / Indicators:  (no physical manifestations of pain) Pain Intervention(s): Monitored during session  Home Living                                          Prior Functioning/Environment              Frequency  Min 2X/week        Progress Toward Goals  OT Goals(current goals can now be found in the care plan section)  Progress towards OT goals: Progressing toward goals  Acute Rehab OT Goals Patient Stated Goal: to find out why I am like this  OT Goal Formulation: With patient Time For Goal Achievement: 04/16/20 Potential to Achieve Goals: Good  Plan Discharge plan remains appropriate    Co-evaluation                  AM-PAC OT "6 Clicks" Daily Activity     Outcome Measure   Help from another person eating meals?: None Help from another person taking care of personal grooming?: A Little Help from another person toileting, which includes using toliet, bedpan, or urinal?: A Lot Help from another person bathing (including washing, rinsing, drying)?: A Lot Help from another person to put on and taking off regular upper body clothing?: A Little Help from another person to put on and taking off regular lower body clothing?: A Lot 6 Click Score: 16    End of Session Equipment Utilized During Treatment: Rolling walker;Gait belt  OT Visit Diagnosis: Unsteadiness on feet (R26.81);Pain Pain - Right/Left: Left Pain - part of body: Shoulder   Activity Tolerance Patient tolerated treatment well   Patient Left in bed;with call bell/phone within reach;with bed alarm set;with family/visitor present   Nurse Communication          Time: 1330-1410 OT Time Calculation (min): 40 min  Charges: OT General Charges $OT Visit: 1 Visit OT Treatments $Neuromuscular Re-education: 38-52 mins  Martie Round, OTR/L Acute Rehabilitation Services Pager: [phone] Office: [phone]   Evern Bio 04/03/2020, 3:05 PM

## 2020-04-04 ENCOUNTER — Inpatient Hospital Stay (HOSPITAL_COMMUNITY)

## 2020-04-04 DIAGNOSIS — I509 Heart failure, unspecified: Secondary | ICD-10-CM

## 2020-04-04 DIAGNOSIS — E871 Hypo-osmolality and hyponatremia: Secondary | ICD-10-CM | POA: Diagnosis present

## 2020-04-04 DIAGNOSIS — E119 Type 2 diabetes mellitus without complications: Secondary | ICD-10-CM

## 2020-04-04 DIAGNOSIS — I10 Essential (primary) hypertension: Secondary | ICD-10-CM | POA: Diagnosis present

## 2020-04-04 LAB — GLUCOSE, CAPILLARY
Glucose-Capillary: 291 mg/dL — ABNORMAL HIGH (ref 70–99)
Glucose-Capillary: 241 mg/dL — ABNORMAL HIGH (ref 70–99)
Glucose-Capillary: 177 mg/dL — ABNORMAL HIGH (ref 70–99)
Glucose-Capillary: 337 mg/dL — ABNORMAL HIGH (ref 70–99)
Glucose-Capillary: 279 mg/dL — ABNORMAL HIGH (ref 70–99)

## 2020-04-04 MED ORDER — LORAZEPAM 2 MG/ML IJ SOLN
1.0000 mg | Freq: Two times a day (BID) | INTRAMUSCULAR | Status: DC | PRN
  Administered 2020-04-05: 1 mg via INTRAVENOUS
  Filled 2020-04-04: qty 1

## 2020-04-04 NOTE — Progress Notes (Signed)
Inpatient Diabetes Program Recommendations  AACE/ADA: New Consensus Statement on Inpatient Glycemic Control (2015)  Target Ranges:  Prepandial:   less than 140 mg/dL      Peak postprandial:   less than 180 mg/dL (1-2 hours)      Critically ill patients:  140 - 180 mg/dL   Lab Results  Component Value Date   GLUCAP 177 (H) 04/04/2020   HGBA1C 14.6 (H) 04/02/2020    Review of Glycemic Control Results for BAILIE, [place] (MRN [id number]) as of 04/04/2020 12:26  Ref. Range 04/03/2020 06:20 04/03/2020 12:41 04/03/2020 16:56 04/03/2020 22:12 04/04/2020 06:28 04/04/2020 11:22  Glucose-Capillary Latest Ref Range: 70 - 99 mg/dL 242 (H) 683 (H) 419 (H) 291 (H) 241 (H) 177 (H)   Diabetes history:  DM2  Outpatient Diabetes medications:  Glipizide 5 mg bid Lantus 40 units QHS  Current orders for Inpatient glycemic control:  Glipizide 5 mg BID Lantus 40 units QHS Novolog 0-20 units tid Novolog 0-5 units QHS  Inpatient Diabetes Program Recommendations:     Lantus 45 units daily Novolog 2 units tid with meals if eats at least 50%  Attempted to cal patient to discuss A1C of 14.6% and DM home regimen.  She is currently working with PT.  Will try again later.  Will continue to follow while inpatient.  Thank you, Dulce Sellar, RN, BSN Diabetes Coordinator Inpatient Diabetes Program [phone] (team pager from 8a-5p)

## 2020-04-04 NOTE — Progress Notes (Signed)
Subjective: Patient reports symptoms and weakness unchanged  Objective: Vital signs in last 24 hours: Temp:  [98 F (36.7 C)-99.7 F (37.6 C)] 98.2 F (36.8 C) (09/07 1101) Pulse Rate:  [79-109] 98 (09/07 1227) Resp:  [16-20] 18 (09/07 1227) BP: (102-130)/(40-77) 130/75 (09/07 1227) SpO2:  [94 %-99 %] 97 % (09/07 1101)  Intake/Output from previous day: 09/06 0701 - 09/07 0700 In: 130 [P.O.:130] Out: 1700 [Urine:1700] Intake/Output this shift: Total I/O In: -  Out: 830 [Urine:830]  LUE delt 1/5, bi 2/5, tri 2/5, WE 0/5, grip 2/5 LLE HF 2/5, KE 2/5, DF/PF 3/5 RLE HF 3/5, KE 3/5, DP/PF 3/5 RUE 4+/5 throughout  Lab Results: Recent Labs    04/01/20 1300 04/01/20 1635  WBC 6.9 7.4  HGB 13.5 13.2  HCT 42.1 41.3  PLT 225 227   BMET Recent Labs    04/01/20 1300 04/01/20 1635  NA 134* 133*  K 4.3 4.4  CL 99 99  CO2 25 25  GLUCOSE 372* 453*  BUN 10 10  CREATININE 0.67 0.73  CALCIUM 9.3 9.1    Studies/Results: No results found.  Assessment/Plan: 34 yo F with hx of bipolar disorder with weakness.  No structural disease in brain or spine - continue neurologic workup  Nancy [name] 04/04/2020, 12:44 PM

## 2020-04-04 NOTE — Progress Notes (Signed)
Physical Therapy Treatment Patient Details Name: Nancy [name] MRN: [id number] DOB: [birth year] Today's Date: 04/04/2020    History of Present Illness This 34 y.o. female admitted for evaluation of neck pain, and Lt UE weakness, and bil. LE weakness as well as accompanying neck pain and urinary incontinence. MRI of brain, cervical, thoracic, and lumbar spine negative for acute lesions.  Work up underway.  PMH includes.  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 Patient agreed to PT, however wanted husband to assist her with changing her underwear first. She preferred PT not assist and requested PT to step outside. On return, pt sitting on EOB and BP assessed as she reported low BP recently (seated 106/74). Stood twice from EOB with +2 min assist (pt using rocking/momentum)--assist to stabilize left hand on walker grip and for balance as legs and trunk unsteady. Unsafe to attempt ambulation. Educated in lateral transfer along EOB and how this can progress to wheelchair transfer.    Follow Up Recommendations  CIR     Equipment Recommendations  Wheelchair (measurements PT);Wheelchair cushion (measurements PT);Rolling walker with 5" wheels    Recommendations for Other Services       Precautions / Restrictions Precautions Precautions: Fall Precaution Comments: reports falls "a lot" at home; describes knees buckling    Mobility  Bed Mobility Overal bed mobility: Needs Assistance Bed Mobility: Sit to Sidelying     Supine to sit:  (*pt performed with husband after asking PT to step out)   Sit to sidelying: Mod assist General bed mobility comments: assist for LLE onto bed (going onto rt side) with pt lifting RLE onto bed as rolling onto her  back  Transfers Overall transfer level: Needs assistance Equipment used: Rolling walker (2 wheeled) Transfers: Sit to/from Stand;Lateral/Scoot Transfers Sit to Stand: Min assist;+2 safety/equipment;+2 physical assistance (bed at lowest height)        Lateral/Scoot Transfers: Min guard General transfer comment: using momentum/rocking and heavy use of rt side; sit to stand x 2; lateral scoot to her rt along EOB x 4 in preparation for lateral scoot to wheelchair  Ambulation/Gait             General Gait Details: unsafe to attempt; bil legs unsteady in standing with poor control of trunk as cued to stand erect.    Stairs             [employer] mobility:  (discussed need to plan for use of wheelchair)  Modified Rankin (Stroke Patients Only)       Balance Overall balance assessment: Needs assistance Sitting-balance support: Feet supported Sitting balance-Leahy Scale: Good     Standing balance support: Bilateral upper extremity supported Standing balance-Leahy Scale: Poor Standing balance comment: B UE support and min assist                            Cognition Arousal/Alertness: Awake/alert Behavior During Therapy: Flat affect Overall Cognitive Status: Within Functional Limits for tasks assessed                                        Exercises Other Exercises Other Exercises: to straighten bed  pads under her, pt able to bridge hips barely off the bed with use of bil LEs in hooklying (no support needed to maintain LLE in position and hips rising equally)     General Comments        Pertinent Vitals/Pain Pain Assessment: 0-10 Pain Score: 10-Worst pain ever Pain Location: bil LEs Pain Descriptors / Indicators: Sharp (no physical manifestations of pain) Pain Intervention(s): Monitored during session;Patient requesting pain meds-RN notified;RN gave pain meds during session    Home Living                       Prior Function            PT Goals (current goals can now be found in the care plan section) Acute Rehab PT Goals Patient Stated Goal: to find out why I am like this  Time For Goal Achievement: 04/16/20 Potential to Achieve Goals: Fair (unclear diagnosis) Progress towards PT goals: Not progressing toward goals - comment (incr reports of pain; legs less steady)    Frequency    Min 3X/week      PT Plan Current plan remains appropriate    Co-evaluation              AM-PAC PT "6 Clicks" Mobility   Outcome Measure  Help needed turning from your back to your side while in a flat bed without using bedrails?: A Little Help needed moving from lying on your back to sitting on the side of a flat bed without using bedrails?: A Lot Help needed moving to and from a bed to a chair (including a wheelchair)?: A Lot Help needed standing up from a chair using your arms (e.g., wheelchair or bedside chair)?: A Lot Help needed to walk in hospital room?: Total Help needed climbing 3-5 steps with a railing? : Total 6 Click Score: 11    End of Session Equipment Utilized During Treatment: Gait belt Activity Tolerance: Patient tolerated treatment well Patient left: in bed;with call bell/phone within reach;with bed alarm set;with SCD's reapplied Nurse Communication: Mobility status;Patient requests pain meds PT Visit Diagnosis: Other abnormalities of gait and mobility (R26.89);Repeated falls (R29.6);Muscle weakness (generalized) (M62.81);Other symptoms and signs involving the nervous system (R29.898)     Time: 5366-4403 PT Time Calculation (min) (ACUTE ONLY): 32 min  Charges:  $Therapeutic Activity: 23-37 mins                      [name], PT Pager [phone]    Nancy [name] 04/04/2020, 12:59 PM

## 2020-04-05 ENCOUNTER — Inpatient Hospital Stay (HOSPITAL_COMMUNITY)

## 2020-04-05 DIAGNOSIS — E119 Type 2 diabetes mellitus without complications: Secondary | ICD-10-CM

## 2020-04-05 LAB — COMPREHENSIVE METABOLIC PANEL
ALT: 19 U/L (ref 0–44)
AST: 20 U/L (ref 15–41)
Albumin: 3 g/dL — ABNORMAL LOW (ref 3.5–5.0)
Alkaline Phosphatase: 72 U/L (ref 38–126)
Anion gap: 9 (ref 5–15)
BUN: 8 mg/dL (ref 6–20)
CO2: 24 mmol/L (ref 22–32)
Calcium: 9.5 mg/dL (ref 8.9–10.3)
Chloride: 100 mmol/L (ref 98–111)
Creatinine, Ser: 0.54 mg/dL (ref 0.44–1.00)
GFR calc Af Amer: >60 mL/min (ref >60)
GFR calc non Af Amer: >60 mL/min (ref >60)
Glucose, Bld: 257 mg/dL — ABNORMAL HIGH (ref 70–99)
Potassium: 4.1 mmol/L (ref 3.5–5.1)
Sodium: 133 mmol/L — ABNORMAL LOW (ref 135–145)
Total Bilirubin: 0.9 mg/dL (ref 0.3–1.2)
Total Protein: 7 g/dL (ref 6.5–8.1)

## 2020-04-05 LAB — CBC
HCT: 44.6 % (ref 36.0–46.0)
Hemoglobin: 14 g/dL (ref 12.0–15.0)
MCH: 27.5 pg (ref 26.0–34.0)
MCHC: 31.4 g/dL (ref 30.0–36.0)
MCV: 87.6 fL (ref 80.0–100.0)
Platelets: 223 10*3/uL (ref 150–400)
RBC: 5.09 MIL/uL (ref 3.87–5.11)
RDW: 12.2 % (ref 11.5–15.5)
WBC: 6.3 10*3/uL (ref 4.0–10.5)
nRBC: 0 % (ref 0.0–0.2)

## 2020-04-05 LAB — CSF CELL COUNT WITH DIFFERENTIAL
RBC Count, CSF: 11500 /mm3 — ABNORMAL HIGH
Tube #: 3
WBC, CSF: 9 /mm3 — ABNORMAL HIGH (ref 0–5)

## 2020-04-05 LAB — GLUCOSE, CAPILLARY
Glucose-Capillary: 253 mg/dL — ABNORMAL HIGH (ref 70–99)
Glucose-Capillary: 232 mg/dL — ABNORMAL HIGH (ref 70–99)
Glucose-Capillary: 275 mg/dL — ABNORMAL HIGH (ref 70–99)
Glucose-Capillary: 274 mg/dL — ABNORMAL HIGH (ref 70–99)

## 2020-04-05 LAB — PROTEIN, CSF: Total  Protein, CSF: 167 mg/dL — ABNORMAL HIGH (ref 15–45)

## 2020-04-05 LAB — GLUCOSE, CSF: Glucose, CSF: 148 mg/dL — ABNORMAL HIGH (ref 40–70)

## 2020-04-05 IMAGING — RF DG SPINAL PUNCT LUMBAR DIAG WITH FL CT GUIDANCE
7 of 8 series · 9 of 13 positions shown · non-contrast
Comparison: none

CLINICAL DATA: Bilateral lower extremity weakness, encephalopathy.

[Series 1: cp_standard · 0.09mm/px · 1 of 1 slices shown (1 of 2)]
[im 1/1]
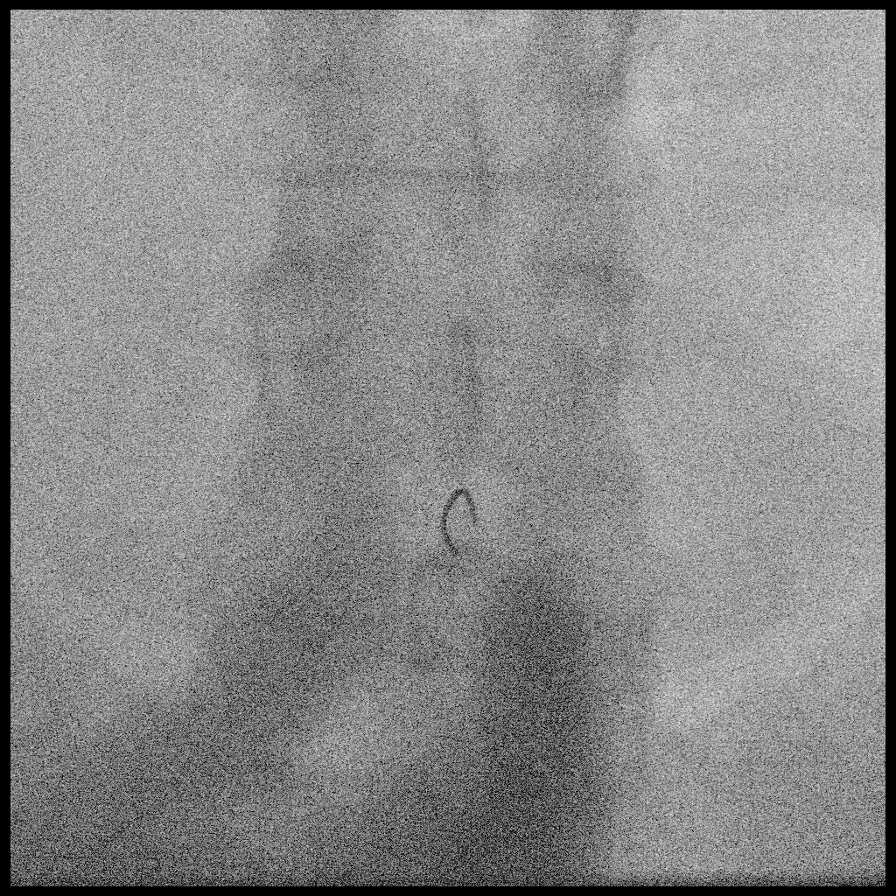

[Series 2: cp_standard · 0.17mm/px · 2 of 217 frames shown (2 of 2)]
[frame 33/217]
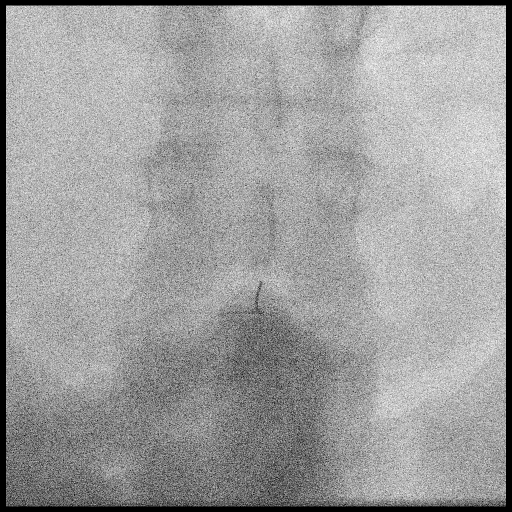
[frame 109/217]
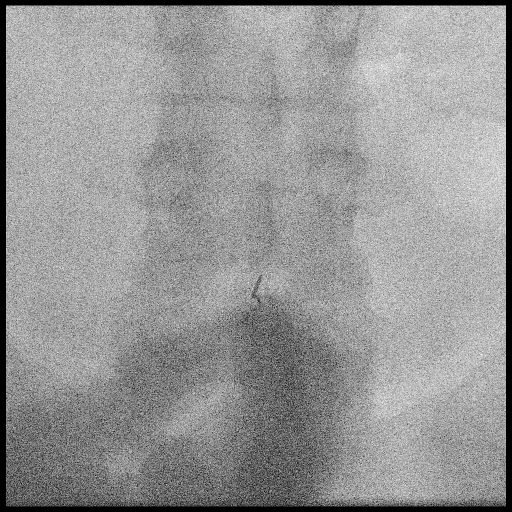

[Series 6: x lumbar spine lat · 0.15mm/px · 1 of 1 slices shown (1 of 3)]
[im 1/1]
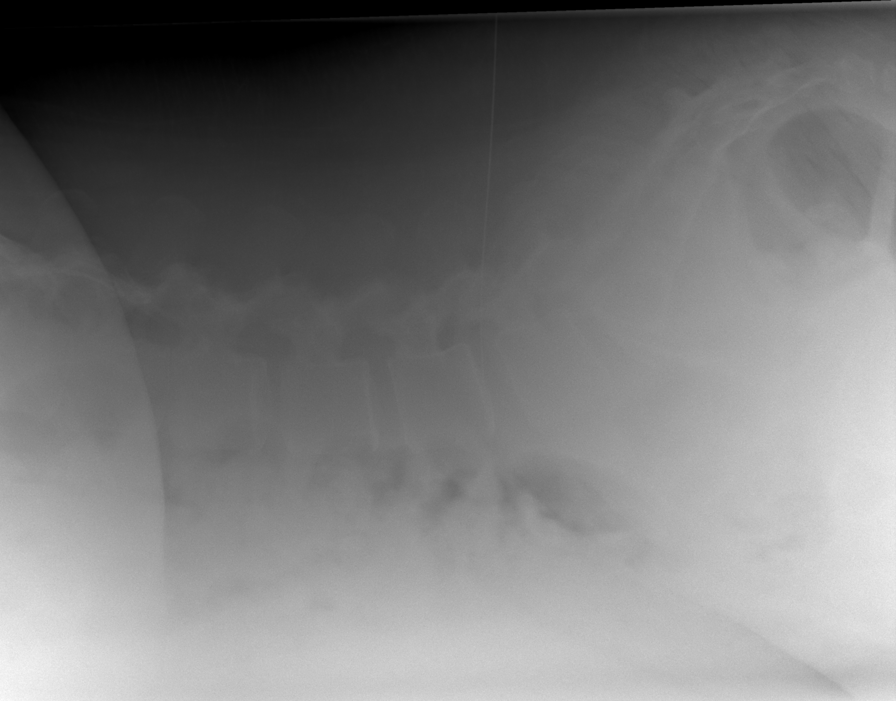

[Series 7: x lumbar spine lat · 0.15mm/px · 1 of 1 slices shown (2 of 3)]
[im 1/1]
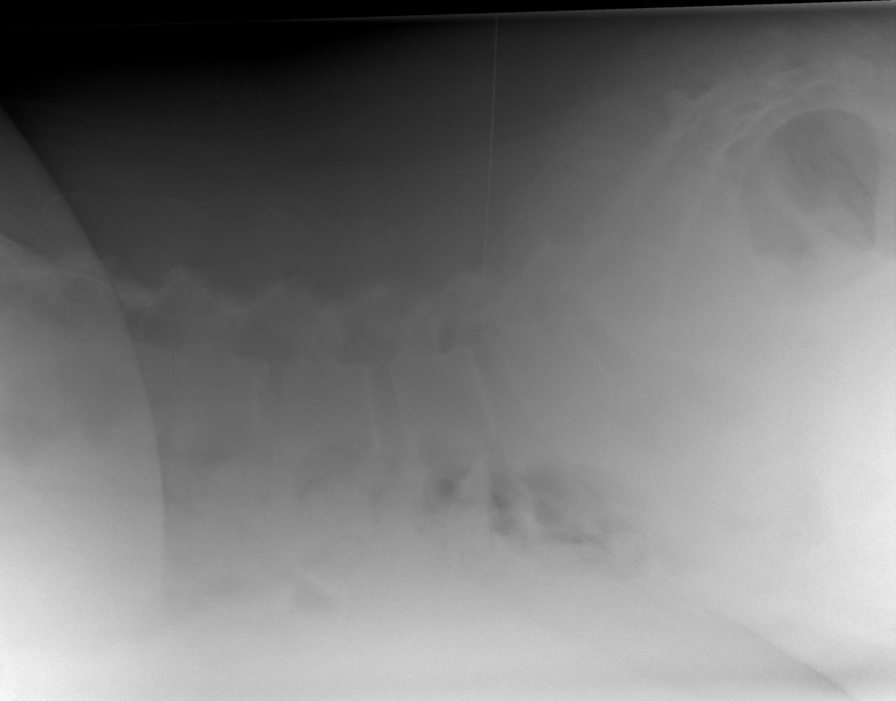

[Series 8: x lumbar spine lat · 0.15mm/px · 1 of 1 slices shown (3 of 3)]
[im 1/1]
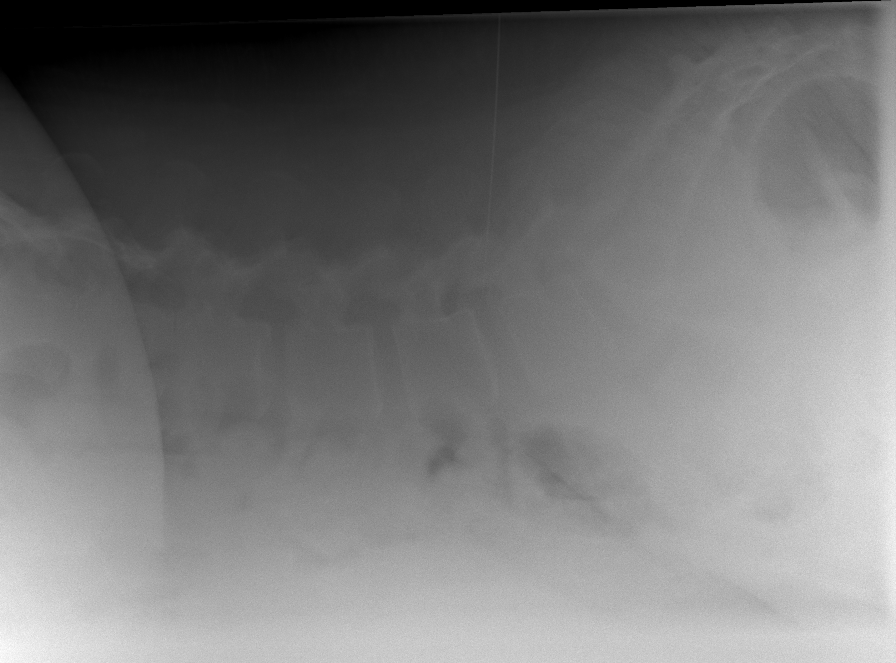

[Series 9: fluoro_iodine 2fps_bw · 0.09mm/px · 2 of 2 frames shown (1 of 2)]
[frame 1/2]
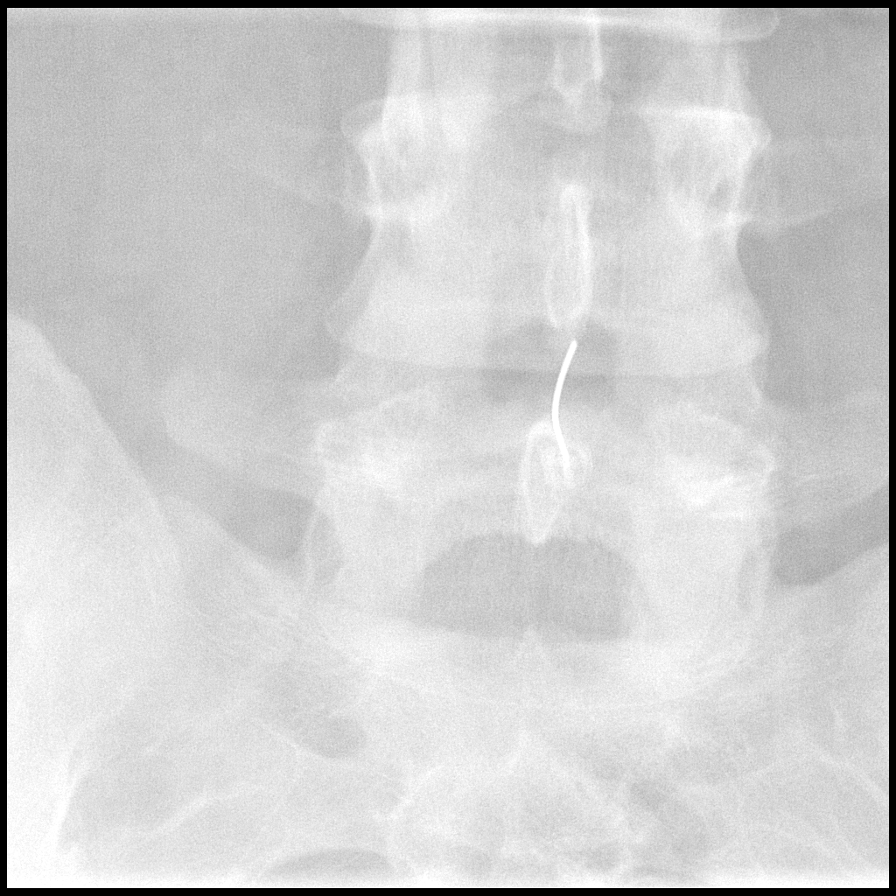
[frame 2/2]
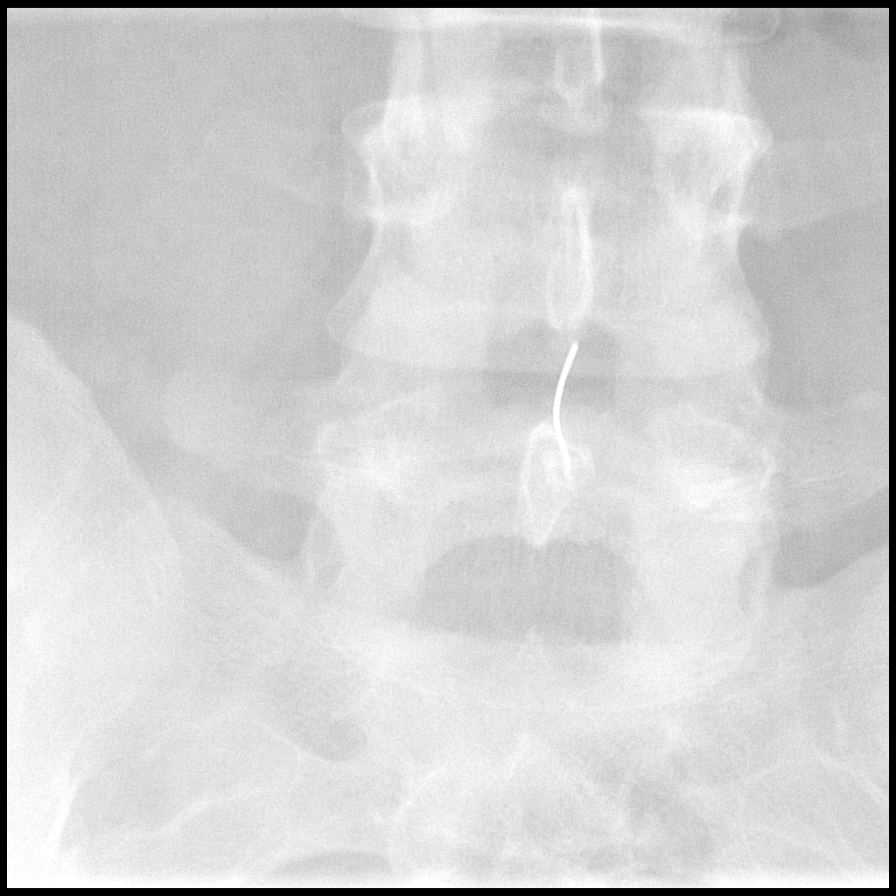

[Series 10: fluoro_iodine 2fps_bw · 0.09mm/px · 1 of 2 frames shown (2 of 2)]
[frame 1/2]
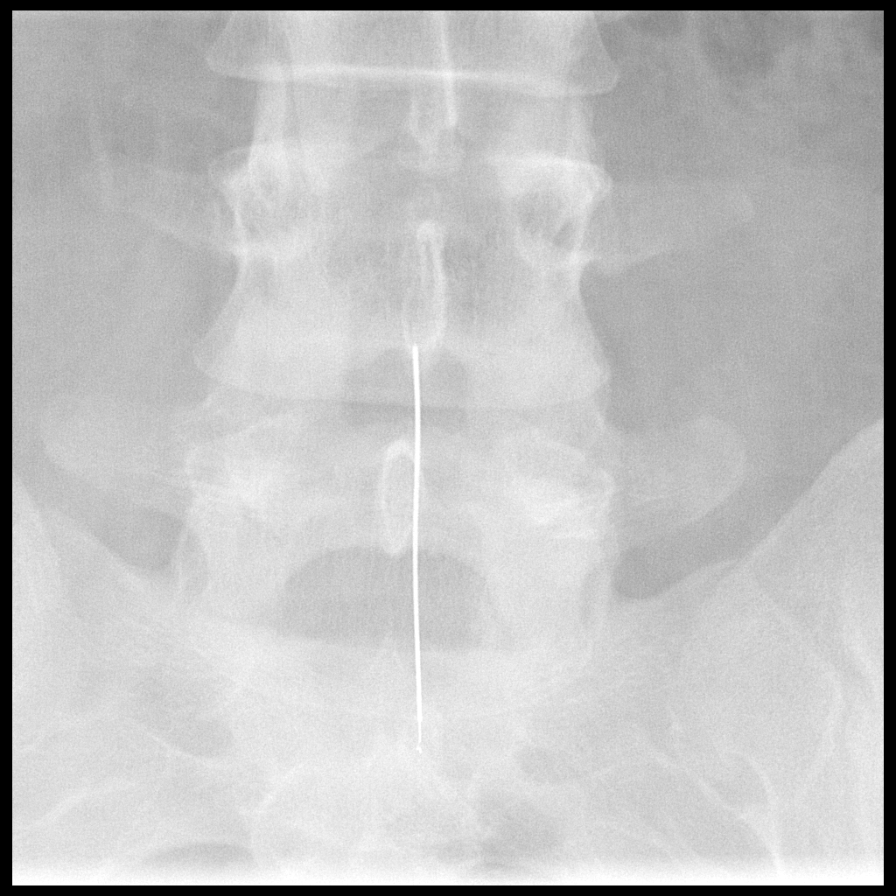

[9 of 13 positions shown; findings below may reference images not displayed]

EXAM:
DIAGNOSTIC LUMBAR PUNCTURE UNDER FLUOROSCOPIC GUIDANCE

FLUOROSCOPY TIME:  Radiation Exposure Index (if provided by the
fluoroscopic device): 523.2 mGy.

PROCEDURE:
Informed consent was obtained from the patient prior to the
procedure, including potential complications of headache, allergy,
and pain. With the patient prone, the lower back was prepped with
Betadine. 1% Lidocaine was used for local anesthesia. Lumbar
puncture was performed at the L4-5 level using a 7 inch 22 gauge
needle with return of slightly blood tinged CSF. Due to the
difficulty obtaining fluid, pressure was not measured. 10 ml of CSF
were obtained for laboratory studies. The patient tolerated the
procedure well and there were no apparent complications.
IMPRESSION: Under fluoroscopic guidance, lumbar puncture was performed for
diagnostic purposes.

## 2020-04-05 MED ORDER — INSULIN GLARGINE 100 UNIT/ML ~~LOC~~ SOLN
45.0000 [IU] | Freq: Every day | SUBCUTANEOUS | Status: DC
  Administered 2020-04-05: 45 [IU] via SUBCUTANEOUS
  Filled 2020-04-05 (×2): qty 0.45

## 2020-04-05 MED ORDER — NICOTINE POLACRILEX 2 MG MT GUM
2.0000 mg | CHEWING_GUM | OROMUCOSAL | Status: DC | PRN
  Administered 2020-04-09: 2 mg via ORAL
  Filled 2020-04-05 (×3): qty 1

## 2020-04-05 MED ORDER — INSULIN ASPART 100 UNIT/ML ~~LOC~~ SOLN
6.0000 [IU] | Freq: Three times a day (TID) | SUBCUTANEOUS | Status: DC
  Administered 2020-04-06 – 2020-04-10 (×14): 6 [IU] via SUBCUTANEOUS

## 2020-04-05 MED ORDER — LIDOCAINE HCL (PF) 1 % IJ SOLN
5.0000 mL | Freq: Once | INTRAMUSCULAR | Status: AC
  Administered 2020-04-05: 5 mL via INTRADERMAL

## 2020-04-05 MED ORDER — IMMUNE GLOBULIN (HUMAN) 10 GM/100ML IV SOLN
400.0000 mg/kg | INTRAVENOUS | Status: AC
  Administered 2020-04-05 – 2020-04-09 (×5): 40 g via INTRAVENOUS
  Filled 2020-04-05 (×2): qty 400
  Filled 2020-04-05: qty 200
  Filled 2020-04-05 (×2): qty 400

## 2020-04-05 NOTE — Progress Notes (Signed)
Inpatient Diabetes Program Recommendations  AACE/ADA: New Consensus Statement on Inpatient Glycemic Control (2015)  Target Ranges:  Prepandial:   less than 140 mg/dL      Peak postprandial:   less than 180 mg/dL (1-2 hours)      Critically ill patients:  140 - 180 mg/dL   Lab Results  Component Value Date   GLUCAP 232 (H) 04/05/2020   HGBA1C 14.6 (H) 04/02/2020    Review of Glycemic Control Results for Nancy [name], Nancy [name] (MRN [id number]) as of 04/05/2020 13:07  Ref. Range 04/04/2020 11:22 04/04/2020 16:46 04/04/2020 21:14 04/05/2020 06:07 04/05/2020 12:49  Glucose-Capillary Latest Ref Range: 70 - 99 mg/dL 786 (H) 767 (H) 209 (H) 253 (H) 232 (H)   Diabetes history:  DM2  Outpatient Diabetes medications:  Glipizide 5 mg bid Lantus 40 units QHS  Current orders for Inpatient glycemic control:  Lantus 40 units QHS Novolog 0-20 units tid Novolog 0-5 units QHS  Inpatient Diabetes Program Recommendations:     Lantus 45 units daily Novolog 2 units tid with meals if eats at least 50%  Attempted to cal patient to discuss A1C of 14.6% and DM home regimen.  She is currently working with PT.  Will try again later.  Will continue to follow while inpatient.  Thank you, Dulce Sellar, RN, BSN Diabetes Coordinator Inpatient Diabetes Program [phone] (team pager from 8a-5p)

## 2020-04-05 NOTE — Progress Notes (Signed)
PT Cancellation Note  Patient Details Name: Nancy [name] MRN: [id number] DOB: May 04, 1986   Cancelled Treatment:    Reason Eval/Treat Not Completed: Patient not medically ready.  Just9/02/2020  Jacinto Halim., PT Acute Rehabilitation Services [phone]  (pager) [phone]  (office) recently back from LP and on bedrest until 6pm.  Will put pt on for tomorrow.    [name] 04/05/2020, 3:12 PM

## 2020-04-05 NOTE — Progress Notes (Signed)
Chart review note I was signed out her care by the outgoing neurologist, to check up on fluoroscopic guided LP results and plan based on that. Patient had been taken for fluoroscopy guided lumbar puncture yesterday but refused the procedure. She was again taken down today, and agreed to the procedure and got fluoroscopic guided lumbar puncture.  Appreciate interventional radiology assistance in obtaining the floor.  CSF results: Cloudy CSF Glucose 148 RBC 11,500 WBC 9 CSF color pink Supernatant colorless Total protein 167  The CSF results are a little complicated to analyze.  She came in with sugars in the 400s so the glucose of 148 in her CSF is not surprising. It was a bloody tap with 11,500 RBCs, so the 9 WBCs also were not unusual.  What is abnormal and unusual is the protein count of 167.  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  I think she will benefit from IVIG treatment for 5 days.  Full neurology  note to follow.  -- Milon Dikes, MD [facility] Pager: [phone] If 7pm to 7am, please call on call as listed on AMION.

## 2020-04-05 NOTE — Progress Notes (Signed)
Neurosurgery  Patient was down in radiology for lumbar puncture.  Per nursing, no changes in neurologic status.  BP 111/64 (BP Location: Right Arm)   Pulse 79   Temp 97.6 F (36.4 C) (Oral)   Resp 20   SpO2 100%   34 yo F with bipolar disorder, obesity who has acute leg weakness, subacute left arm weakness without structural findings on MRI - continue neurologic workup of weakness - no need for neurosurgical f/u - appreciate assistance of hospitalists and neurologists in caring for this patient

## 2020-04-05 NOTE — Progress Notes (Signed)
OT Cancellation Note  Patient Details Name: Nancy [name] MRN: [id number] DOB: 30-Sep-1985   Cancelled Treatment:    Reason Eval/Treat Not Completed: Patient at procedure or test/ unavailable Pt off of floor at this time; will continue to follow acutely.   Pollyann Glen E. Promise Weldin, COTA/L Acute Rehabilitation Services [phone]  Cherlyn Cushing 04/05/2020, 11:03 AM

## 2020-04-05 NOTE — Progress Notes (Signed)
PROGRESS NOTE    Nancy [name]  VQQ:[id number] DOB: 05/03/1986 DOA: 04/01/2020 PCP: Nancy Alcide, MD (  No chief complaint on file.   Brief Narrative:  34 yo F with hx of obesity, tobacco abuse, T2DM, mood disorder, and multiple other medical problems who presented initially about 1 month ago with left upper extremity weakness.  She notes her L arm and hand "stopped working".  She also notes incontinence and leg weakness prior to presentation.  She was seen by neurosurgery who admitted her for an expedited workup.  [facility] is picking her up on 9/8.  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  Follow oligoclonal bands.  Follow [place] serologies. Neurology c/s, appreciate recs - IVIG per neurology Neurosurgery, appreciate recs  Hypertension: continue lasix, chlorthalidone, lisinopril (consider d/c thiazide or loop diuretic as outpatient and using only 1 of the 2 diuretics due to risk of electrolyte abnormalities)  T2DM: poorly controlled, a1c 14.6.  Lantus, mealtime, SSI.  Adjust and follow.  Hold glipizide while inpatient.   Chronic Pain: continue home oxycodone, lyrica  Continue topamax  DVT prophylaxis: (SCD Code Status: full  Family Communication: none at  bedside Disposition:   Status is: Inpatient  Remains inpatient appropriate because:Inpatient level of care appropriate due to severity of illness   Dispo: The patient is from: Home              Anticipated d/c is to: pending              Anticipated d/c date is: > 3 days              Patient currently is not medically stable to d/c.   Consultants:   Neurology  neurosurgery  Procedures:  LP 9/8  Antimicrobials: Anti-infectives (From admission, onward)   None      Subjective: Frustrated, asking what will happen if we don't figure out what's going on in hospital  Objective: Vitals:   04/05/20 1915 04/05/20 1921 04/05/20 1933 04/05/20 1935  BP: 121/74 121/74 127/67 127/67  Pulse: 100 99 96 99  Resp: 18 18 20 20   Temp: 98 F (36.7 C) 98 F (36.7 C) 98.1 F (36.7 C) 98.1 F (36.7 C)  TempSrc: Oral Oral Oral Oral  SpO2: 100% 100% 100% 100%  Weight:      Height:        Intake/Output Summary (Last 24 hours) at 04/05/2020 2022 Last data filed at 04/05/2020 1700 Gross per 24 hour  Intake --  Output 500 ml  Net -500 ml   Filed Weights   04/05/20 1647  Weight: (!) 154.3 kg    Examination:  General exam: Appears calm and comfortable  Respiratory system: Clear to auscultation. Respiratory effort normal. Cardiovascular system: S1 & S2 heard, RRR.  Gastrointestinal system: Abdomen is nondistended, soft and nontender Central nervous system: Alert and oriented.  Extremities: L wrist drop, LUE weakness, bilateral LE weakness Skin: No rashes, lesions or ulcers Psychiatry: Judgement and insight appear normal. Mood & affect appropriate.     Data Reviewed: I have personally reviewed following labs and imaging studies  CBC: Recent Labs  Lab 04/01/20 1300 04/01/20 1635 04/05/20 1334  WBC 6.9 7.4 6.3  NEUTROABS  --  2.7  --   HGB 13.5 13.2 14.0  HCT 42.1 41.3 44.6  MCV 88.1 86.0 87.6  PLT 225 227 223    Basic Metabolic Panel: Recent Labs  Lab 04/01/20 1300  04/01/20 1635 04/05/20 1334  NA 134* 133* 133*  K 4.3 4.4 4.1  CL 99 99 100  CO2 25 25 24   GLUCOSE 372* 453* 257*  BUN 10 10 8   CREATININE 0.67 0.73 0.54  CALCIUM 9.3 9.1 9.5    GFR: Estimated Creatinine Clearance: 157.3 mL/min (by C-G formula based on SCr of 0.54 mg/dL).  Liver Function Tests: Recent Labs  Lab 04/01/20 1635 04/05/20 1334  AST 20 20  ALT 19 19  ALKPHOS 82 72  BILITOT 0.4 0.9  PROT 7.0 7.0  ALBUMIN 3.2* 3.0*    CBG: Recent Labs  Lab 04/04/20 1646 04/04/20 2114 04/05/20 0607 04/05/20 1249 04/05/20 1631  GLUCAP 337* 279* 253* 232* 275*     Recent Results (from the past 240 hour(s))  SARS Coronavirus 2 by RT PCR (hospital order, performed in [facility] hospital lab) Nasopharyngeal Nasopharyngeal Swab     Status: None   Collection Time: 04/01/20  4:15 PM   Specimen: Nasopharyngeal Swab  Result Value Ref Range Status   SARS Coronavirus 2 NEGATIVE NEGATIVE Final    Comment: (NOTE) SARS-CoV-2 target nucleic acids are NOT DETECTED.  The SARS-CoV-2 RNA is generally detectable in upper and lower respiratory specimens during the acute phase of infection. The lowest concentration of SARS-CoV-2 viral copies this assay can detect is 250 copies / mL. Nancy [name] negative result does not preclude SARS-CoV-2 infection and should not be used as the sole basis for treatment or other patient management decisions.  Nancy [name] negative result may occur with improper specimen collection / handling, submission of specimen other than nasopharyngeal swab, presence of viral mutation(s) within the areas targeted by this assay, and inadequate number of viral copies (<250 copies / mL). Nancy [name] negative result must be combined with clinical observations, patient history, and epidemiological information.  Fact Sheet for Patients:   [facility]  Fact Sheet for Healthcare Providers: 06/01/20  This test is not yet approved or   cleared by the [employer] FDA and has been authorized for detection and/or diagnosis of SARS-CoV-2 by FDA under an Emergency Use Authorization (EUA).  This EUA will remain in effect (meaning this test can be used) for the duration of the COVID-19 declaration under Section 564(b)(1) of the Act, 21 U.S.C. section 360bbb-3(b)(1), unless the authorization is terminated or revoked sooner.  Performed at [facility], [address], [place], [address] [place]   CSF culture     Status: None (Preliminary result)   Collection Time: 04/05/20 12:06 PM   Specimen: Shalonda Sachse: [facility] Cytology CSF; Cerebrospinal Fluid   B: [facility] Cyt[facility] CSF; Cerebrospinal Fluid   C: [facility] Cytology CSF; Cerebrospinal Fluid  Result Value Ref Range Status   Specimen Description CSF  Final   Special Requests   Final    NONE Performed at [facility], [address] [place], [place] [address]    Gram Stain NO ORGANISMS SEEN  Final   Culture PENDING  Incomplete   Report Status PENDING  Incomplete         Radiology Studies: DG FL GUIDED LUMBAR PUNCTURE  Result Date: 04/05/2020 Roque Lias, MD     04/05/2020 12:15 PM Under flouro guidance, LP performed at L4-5 level. 10 mls of slightly red-tinged CSF obtained. No immediate complication. Roque Lias. MD       Scheduled Meds:  chlorthalidone  25 mg Oral BID   diclofenac Sodium  2-8 g Topical QHS   docusate sodium  100 mg Oral BID   furosemide  40 mg Oral q AM   insulin aspart  0-20 Units Subcutaneous TID WC   insulin aspart  0-5 Units Subcutaneous QHS   insulin glargine  40 Units Subcutaneous QHS   lisinopril  2.5 mg Oral Daily   nicotine  21 mg Transdermal Daily   potassium chloride SA  20 mEq Oral Daily   pregabalin  300 mg Oral BID   topiramate  50 mg Oral q AM   Continuous Infusions:  Immune Globulin 10% 40 g (04/05/20 1856)     LOS: 2 days    Time spent: over 30 min    Lacretia Nicks, MD [facility]   To  contact the attending provider between 7A-7P or the covering provider during after hours 7P-7A, please log into the web site [email or web address] and access using universal [place] password for that web site. If you do not have the password, please call the hospital operator.  04/05/2020, 8:22 PM

## 2020-04-05 NOTE — Progress Notes (Signed)
Occupational Therapy Treatment Patient Details Name: Nancy [name] MRN: [id number] DOB: 10-Apr-1986 Today's Date: 04/05/2020    History of present illness This 34 y.o. female admitted for evaluation of neck pain, and Lt UE weakness, and bil. LE weakness as well as accompanying neck pain and urinary incontinence. MRI of brain, cervical, thoracic, and lumbar spine negative for acute lesions.  Work up underway.  PMH includes.  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 Pt engaged in A/AAROM to LUE across all planes for multiple reps. Further educated pt in self-ROM techniques and encouraged her to perform on her own when not working with therapies. Pt verbalizing understanding; still with kinesiotape from previous OT session. Will continue per POC at this time.   Follow Up Recommendations  CIR    Equipment Recommendations  3 in 1 bedside commode          Precautions / Restrictions Precautions Precautions: Fall Precaution Comments: reports falls "a lot" at home; describes knees buckling Restrictions Weight Bearing Restrictions: No       Mobility Bed Mobility               General bed mobility comments: deferred, pt had LP today and still on bedrest   Transfers                      Balance                                           ADL either performed or assessed with clinical judgement   ADL                                         General ADL Comments: focus on LUE HEP     Vision       Perception     Praxis      Cognition  Arousal/Alertness: Awake/alert Behavior During Therapy: Flat affect Overall Cognitive Status: Within Functional Limits for tasks assessed                                          Exercises Other Exercises Other Exercises: scapular elevation/depression, and retraction and hold, x5-10 reps Other Exercises: LUE shoulder flexion, horizontal abduction/adduction AAROM x10 Other Exercises: LUE elbow flexion/extension (both gravity eliminated and against gravity), AAROM x10 Other Exercises: forearm sup/pronation, wrist and digit flex/ext, AAROM x10    Shoulder Instructions       General Comments      Pertinent Vitals/ Pain       Pain Assessment: Faces Faces Pain Scale: Hurts little more Pain Location: LUE with certain movements  Pain Descriptors / Indicators: Discomfort;Sore (no physical manifestations of pain)  Home Living  Prior Functioning/Environment              Frequency  Min 2X/week        Progress Toward Goals  OT Goals(current goals can now be found in the care plan section)  Progress towards OT goals: Progressing toward goals  Acute Rehab OT Goals Patient Stated Goal: to find out why I am like this  OT Goal Formulation: With patient Time For Goal Achievement: 04/16/20 Potential to Achieve Goals: Good ADL Goals Pt Will Perform Grooming: with min assist;standing Pt Will Perform Upper Body Bathing: with set-up;sitting Pt Will Perform Lower Body Bathing: with min assist;with adaptive equipment;sit to/from stand Pt Will Perform Upper Body Dressing: with set-up;sitting Pt Will Perform Lower Body Dressing: with min assist;sit to/from stand;with adaptive equipment Pt Will Transfer to Toilet: with min assist;ambulating;regular height toilet;bedside commode;grab bars Pt Will Perform Toileting - Clothing Manipulation and hygiene: with min assist;sit to/from stand Pt/caregiver will Perform Home  Exercise Program: Increased strength;Increased ROM;Left upper extremity;With Supervision;With written HEP provided  Plan Discharge plan remains appropriate    Co-evaluation                 AM-PAC OT "6 Clicks" Daily Activity     Outcome Measure   Help from another person eating meals?: None Help from another person taking care of personal grooming?: A Little Help from another person toileting, which includes using toliet, bedpan, or urinal?: A Lot Help from another person bathing (including washing, rinsing, drying)?: A Lot Help from another person to put on and taking off regular upper body clothing?: A Little Help from another person to put on and taking off regular lower body clothing?: A Lot 6 Click Score: 16    End of Session    OT Visit Diagnosis: Unsteadiness on feet (R26.81);Pain Pain - Right/Left: Left Pain - part of body: Shoulder   Activity Tolerance Patient tolerated treatment well   Patient Left in bed;with call bell/phone within reach;with bed alarm set   Nurse Communication Mobility status        Time: 1324-4010 OT Time Calculation (min): 18 min  Charges: OT General Charges $OT Visit: 1 Visit OT Treatments $Therapeutic Activity: 8-22 mins  [name] Siren, OT Acute Rehabilitation Services Pager [phone] Office [phone]    Orlando Penner 04/05/2020, 5:33 PM

## 2020-04-05 NOTE — Progress Notes (Signed)
NEUROLOGY PROGRESS NOTE   Subjective: Continues to say that she feels very weak in her lower extremities in addition that she has decreased sensation in her lower extremities.  Exam: Vitals:   04/05/20 1254 04/05/20 1609  BP: 91/60 (!) 136/95  Pulse: 84 98  Resp: 20 20  Temp: 99.2 F (37.3 C)   SpO2: 100% 100%   Neuro:  Mental Status: Alert, oriented, thought content appropriate.  Speech fluent without evidence of aphasia.  Able to follow 3 step commands without difficulty. Cranial Nerves: II:  Visual fields grossly normal,  III,IV, VI: ptosis not present, extra-ocular motions intact bilaterally pupils equal, round, reactive to light and accommodation V,VII: smile symmetric, facial light touch sensation normal bilaterally VIII: hearing normal bilaterally Motor: Patient has a left wrist drop which has been present for over 4 months, in the lower extremities she has 4/5 strength with multiple episodes of giveaway strength.  Upper extremity she is 5/5 again still with giveaway strength Sensory: Decreased sensation up to knee with light touch, pinprick, temperature.  Vibratory sensation absent until up to knee.  Proprioception intact Deep Tendon Reflexes: 2+ bilaterally at the brachioradialis with no knee jerk bilaterally Plantars: Mute bilaterally      Medications:  Scheduled: . chlorthalidone  25 mg Oral BID  . diclofenac Sodium  2-8 g Topical QHS  . docusate sodium  100 mg Oral BID  . furosemide  40 mg Oral q AM  . insulin aspart  0-20 Units Subcutaneous TID WC  . insulin aspart  0-5 Units Subcutaneous QHS  . insulin glargine  40 Units Subcutaneous QHS  . lisinopril  2.5 mg Oral Daily  . nicotine  21 mg Transdermal Daily  . potassium chloride SA  20 mEq Oral Daily  . pregabalin  300 mg Oral BID  . topiramate  50 mg Oral q AM   Continuous:   Pertinent Labs/Diagnostics: Results for Nancy [name], Nancy [name] (MRN [id number]) as of 04/05/2020 16:27  Ref. Range 04/05/2020 12:06   Appearance, CSF Latest Ref Range: CLEAR  CLOUDY (A)  Glucose, CSF Latest Ref Range: 40 - 70 mg/dL 846 (H)  RBC Count, CSF Latest Ref Range: 0 /cu mm 11,500 (H)  WBC, CSF Latest Ref Range: 0 - 5 /cu mm 9 (H)  Other Cells, CSF Unknown TOO FEW TO COUNT, SMEAR AVAILABLE FOR REVIEW  Color, CSF Latest Ref Range: COLORLESS  PINK (A)  Supernatant Unknown COLORLESS  Total  Protein, CSF Latest Ref Range: 15 - 45 mg/dL 962 (H)  Tube # Unknown 3    DG FL GUIDED LUMBAR PUNCTURE  Result Date: 04/05/2020 Roque Lias, MD     04/05/2020 12:15 PM Under flouro guidance, LP performed at L4-5 level. 10 mls of slightly red-tinged CSF obtained. No immediate complication. Roque Lias. MD    Felicie Morn PA-C [facility] [phone]  Assessment:  34 year old female who was brought to the emergency department with a 4-week history of left upper extremity and a distinct episode of bilateral lower extremity pain and weakness along with urinary incontinence.  Patient also endorsed symmetric bilateral lower extremity weakness.  Brain MRI, cervical spine thoracic and lumbar spine showed no cord compression no brainstem stroke and no transverse myelitis.  No enhancement on nerve roots.  On exam today there still remains some functional component such as giveaway weakness.  However that said patient did have an a lumbar puncture which showed elevated protein at 167.  Although this has significantly elevated red blood cells the protein still  would be much higher than would be expected.  Thus Guillain-Barr must be considered.  Discussion with patient to receive IVIG was had and patient was agreeable to this.  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

## 2020-04-05 NOTE — Procedures (Signed)
Under flouro guidance, LP performed at L4-5 level. 10 mls of slightly red-tinged CSF obtained. No immediate complication.  Roque Lias. MD

## 2020-04-06 LAB — GLUCOSE, CAPILLARY
Glucose-Capillary: 253 mg/dL — ABNORMAL HIGH (ref 70–99)
Glucose-Capillary: 295 mg/dL — ABNORMAL HIGH (ref 70–99)
Glucose-Capillary: 216 mg/dL — ABNORMAL HIGH (ref 70–99)
Glucose-Capillary: 232 mg/dL — ABNORMAL HIGH (ref 70–99)

## 2020-04-06 LAB — CBC WITH DIFFERENTIAL/PLATELET
Abs Immature Granulocytes: 0.01 10*3/uL (ref 0.00–0.07)
Basophils Absolute: 0 10*3/uL (ref 0.0–0.1)
Basophils Relative: 0 %
Eosinophils Absolute: 0.1 10*3/uL (ref 0.0–0.5)
Eosinophils Relative: 2 %
HCT: 41.8 % (ref 36.0–46.0)
Hemoglobin: 13.4 g/dL (ref 12.0–15.0)
Immature Granulocytes: 0 %
Lymphocytes Relative: 53 %
Lymphs Abs: 3.1 10*3/uL (ref 0.7–4.0)
MCH: 28.2 pg (ref 26.0–34.0)
MCHC: 32.1 g/dL (ref 30.0–36.0)
MCV: 88 fL (ref 80.0–100.0)
Monocytes Absolute: 0.3 10*3/uL (ref 0.1–1.0)
Monocytes Relative: 6 %
Neutro Abs: 2.3 10*3/uL (ref 1.7–7.7)
Neutrophils Relative %: 39 %
Platelets: 210 10*3/uL (ref 150–400)
RBC: 4.75 MIL/uL (ref 3.87–5.11)
RDW: 12.4 % (ref 11.5–15.5)
WBC: 5.8 10*3/uL (ref 4.0–10.5)
nRBC: 0 % (ref 0.0–0.2)

## 2020-04-06 LAB — PHOSPHORUS: Phosphorus: 4.5 mg/dL (ref 2.5–4.6)

## 2020-04-06 LAB — COMPREHENSIVE METABOLIC PANEL
ALT: 16 U/L (ref 0–44)
AST: 15 U/L (ref 15–41)
Albumin: 2.6 g/dL — ABNORMAL LOW (ref 3.5–5.0)
Alkaline Phosphatase: 78 U/L (ref 38–126)
Anion gap: 8 (ref 5–15)
BUN: 11 mg/dL (ref 6–20)
CO2: 24 mmol/L (ref 22–32)
Calcium: 8.9 mg/dL (ref 8.9–10.3)
Chloride: 103 mmol/L (ref 98–111)
Creatinine, Ser: 0.51 mg/dL (ref 0.44–1.00)
GFR calc Af Amer: >60 mL/min (ref >60)
GFR calc non Af Amer: >60 mL/min (ref >60)
Glucose, Bld: 300 mg/dL — ABNORMAL HIGH (ref 70–99)
Potassium: 3.5 mmol/L (ref 3.5–5.1)
Sodium: 135 mmol/L (ref 135–145)
Total Bilirubin: 0.6 mg/dL (ref 0.3–1.2)
Total Protein: 7.1 g/dL (ref 6.5–8.1)

## 2020-04-06 LAB — MAGNESIUM: Magnesium: 1.6 mg/dL — ABNORMAL LOW (ref 1.7–2.4)

## 2020-04-06 MED ORDER — LIVING WELL WITH DIABETES BOOK
Freq: Once | Status: DC
  Filled 2020-04-06: qty 1

## 2020-04-06 MED ORDER — DICLOFENAC SODIUM 1 % EX GEL
2.0000 g | Freq: Four times a day (QID) | CUTANEOUS | Status: DC
  Administered 2020-04-06 – 2020-04-12 (×21): 2 g via TOPICAL
  Filled 2020-04-06 (×2): qty 100

## 2020-04-06 MED ORDER — WHITE PETROLATUM EX OINT
TOPICAL_OINTMENT | CUTANEOUS | Status: AC
  Administered 2020-04-06: 0.2
  Filled 2020-04-06: qty 28.35

## 2020-04-06 MED ORDER — WHITE PETROLATUM EX OINT: TOPICAL_OINTMENT | CUTANEOUS | Status: DC | PRN

## 2020-04-06 MED ORDER — INSULIN STARTER KIT- PEN NEEDLES (ENGLISH)
1.0000 | Freq: Once | Status: DC
  Filled 2020-04-06 (×2): qty 1

## 2020-04-06 MED ORDER — INSULIN GLARGINE 100 UNIT/ML ~~LOC~~ SOLN
50.0000 [IU] | Freq: Every day | SUBCUTANEOUS | Status: DC
  Administered 2020-04-06 – 2020-04-09 (×4): 50 [IU] via SUBCUTANEOUS
  Filled 2020-04-06 (×5): qty 0.5

## 2020-04-06 MED ORDER — LIP MEDEX EX OINT
TOPICAL_OINTMENT | CUTANEOUS | Status: DC | PRN
  Filled 2020-04-06: qty 7

## 2020-04-06 MED ORDER — LIDOCAINE 5 % EX PTCH
1.0000 | MEDICATED_PATCH | CUTANEOUS | Status: DC
  Administered 2020-04-08: 1 via TRANSDERMAL
  Filled 2020-04-06 (×4): qty 1

## 2020-04-06 NOTE — Progress Notes (Signed)
Inpatient Diabetes Program Recommendations  AACE/ADA: New Consensus Statement on Inpatient Glycemic Control (2015)  Target Ranges:  Prepandial:   less than 140 mg/dL      Peak postprandial:   less than 180 mg/dL (1-2 hours)      Critically ill patients:  140 - 180 mg/dL   Lab Results  Component Value Date   GLUCAP 295 (H) 04/06/2020   HGBA1C 14.6 (H) 04/02/2020    Review of Glycemic Control Results for Nancy, [name] (MRN [id number]) as of 04/06/2020 15:40  Ref. Range 04/05/2020 06:07 04/05/2020 12:49 04/05/2020 16:31 04/05/2020 21:17 04/06/2020 06:32 04/06/2020 11:51  Glucose-Capillary Latest Ref Range: 70 - 99 mg/dL 253 (H) 232 (H) 275 (H) 274 (H) 253 (H) 295 (H)   Diabetes history:  DM2  Outpatient Diabetes medications:  Lantus 40 units daily (has not taken in over a year) Glipizide 5 mg bid (takes occasionally)  Current orders for Inpatient glycemic control:  Lantus 50 units qhs Novolog 0-20 units tid Novolog 0-5 units qhs Novolog 6 units tid   Note:  Spoke with patient at bedside regarding current A1C and diabetes regimen.  Reviewed patient's current A1c of 14.1%. Explained what a A1c is and what it measures. Also reviewed goal A1c with patient, importance of good glucose control @ home, and blood sugar goals.  She was surprised with her current reading.  She states her A1C is usually 9-10%.  She admits to eating a whole watermelon daily for the last several months because her Topamax makes her mouth dry.  We discussed CHO's and how watermelon contains a large amount of sugar.  We discussed The Plate Method and goal CHO's per meal.    She admits to not taking her Lantus in over a year and she only occasionally takes her Glipizide.  She does not check her blood sugar.  She is very interested in a [employer] CGM.    Patient is sleepy as she just recently had IV pain medicine.  Will continue to follow and educate patient on rapid acting insulins.  Ordered LWWD and insulin starter kit as  she has not taken her Lantus in so long.  Will also ask for a benefit check for the [employer] CGM.   Will continue to follow while inpatient.  Thank you, Reche Dixon, RN, BSN Diabetes Coordinator Inpatient Diabetes Program [phone] (team pager from 8a-5p)

## 2020-04-06 NOTE — Progress Notes (Addendum)
NEUROLOGY PROGRESS NOTE  Subjective: States she continues have pain in her legs also on her shoulders.  At this point does not feel as though the IVIG has helped significantly however she has only had 1 dose.  The stated main complaint is discomfort and pain.  Exam: Vitals:   04/06/20 0321 04/06/20 0841  BP: 105/67 120/76  Pulse: 93 93  Resp: 19 14  Temp: 98.1 F (36.7 C) 97.9 F (36.6 C)  SpO2: 100% 97%   Neuro:  Mental Status: Alert, oriented, thought content appropriate.  Speech fluent without evidence of aphasia.  Able to follow 3 step commands without difficulty. Cranial Nerves: II:  Visual fields grossly normal,  III,IV, VI: ptosis not present, extra-ocular motions intact bilaterally pupils equal, round, reactive to light and accommodation V,VII: smile symmetric, facial light touch sensation normal bilaterally VIII: hearing normal bilaterally Motor: Moving upper extremities antigravity without difficulty.  Continues to have left wrist drop.  Able to lift bilateral legs approximately 1 inch off the bed however she does state there is a component of pain.   Medications:  Scheduled: . chlorthalidone  25 mg Oral BID  . diclofenac Sodium  2 g Topical QID  . docusate sodium  100 mg Oral BID  . insulin aspart  0-20 Units Subcutaneous TID WC  . insulin aspart  0-5 Units Subcutaneous QHS  . insulin aspart  6 Units Subcutaneous TID WC  . insulin glargine  50 Units Subcutaneous QHS  . lidocaine  1 patch Transdermal Q24H  . lisinopril  2.5 mg Oral Daily  . nicotine  21 mg Transdermal Daily  . potassium chloride SA  20 mEq Oral Daily  . pregabalin  300 mg Oral BID  . topiramate  50 mg Oral q AM   Continuous: . Immune Globulin 10% Stopped (04/05/20 2323)    Pertinent Labs/Diagnostics: Glucose 300  Felicie Morn PA-C [facility] [phone]  Assessment:  34 year old female who was brought to the emergency department with a 4-week history of left upper  extremity and a distinct episode of bilateral lower extremity pain and weakness along with urinary incontinence.  Patient also endorsed symmetric bilateral lower extremity weakness.  Brain MRI, cervical spine thoracic and lumbar spine showed no cord compression no brainstem stroke and no transverse myelitis.  No enhancement on nerve roots.  On exam today there still remains some functional component such as giveaway weakness.  However that said patient did have an a lumbar puncture which showed elevated protein at 167.  Although this has significantly elevated red blood cells the protein still would be much higher than would be expected, even after correction.  Likely represent albumin cytological dissociation at this time-either it is AIDP or CIDP type or some sort of diabetic polyradiculoneuropathy which can also present with isolated protein increases, is not clear at this time.  Patient at this point time is at 1 dose of IVIG, she states no significant improvement however I would not expect significant improvement with only 1 dose.  She is to receive her second dose today.  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   04/06/2020, 10:37 AM   Attending Neurohospitalist Addendum Patient seen and examined with APP/Resident. Agree with the history and physical as documented above. Agree with the plan  as documented, which I helped formulate. I have independently reviewed the chart, obtained history, review of systems and examined the patient.I have personally reviewed pertinent head/neck/spine imaging (CT/MRI). Please feel free to call with any questions. --- Milon Dikes, MD [facility] Pager: [phone]  If 7pm to  7am, please call on call as listed on AMION.

## 2020-04-06 NOTE — Progress Notes (Signed)
Physical Therapy Treatment Patient Details Name: Nancy [name] MRN: [id number] DOB: [birth year] Today's Date: 04/06/2020    History of Present Illness This 34 y.o. female admitted for evaluation of neck pain, and Lt UE weakness, and bil. LE weakness as well as accompanying neck pain and urinary incontinence. MRI of brain, cervical, thoracic, and lumbar spine negative for acute lesions. 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 Focus of session on exercises she can work on between therapy sessions. Noted improved strength compared to PT evaluation. Pain remains an issue, however if pre-medicated she is very willing to work through the pain as she has dealt with chronic back pain for years.     Follow Up Recommendations  CIR     Equipment Recommendations  Wheelchair (measurements PT);Wheelchair cushion (measurements PT);Rolling walker with 5" wheels    Recommendations for Other Services Rehab consult     Precautions / Restrictions Precautions Precautions: Fall Precaution Comments: reports falls "a lot" at home; describes knees buckling    Mobility  Bed Mobility Overal bed mobility: Needs Assistance Bed Mobility: Supine to Sit     Supine to sit:  (pull to longsitting with rails from [facility] 45 with cues)     General bed mobility comments: focus on exercise program she can work on  [employer] transfer comment: Pt reports she transferred/scooted to [facility] with NT 04/05/20  Ambulation/Gait                 Stairs             Wheelchair Mobility     Modified Rankin (Stroke Patients Only)       Balance                                            Cognition Arousal/Alertness: Awake/alert Behavior During Therapy: WFL for tasks assessed/performed Overall Cognitive Status: Within Functional Limits for tasks assessed                                        Exercises General Exercises - Lower Extremity Ankle Circles/Pumps: AROM;Both;5 reps (resisted PF; rt slightly stronger than left) Quad Sets: AROM;Both;5 reps (5 sec hold) Heel Slides: AAROM;Strengthening;Both (3 reps each) Hip ABduction/ADduction: AAROM;Both;5 reps Other Exercises Other Exercises: hip internal rotation "toes in" in supine x 5 bil Other Exercises: single leg hooklying, foot supported, hip abdct and adduction (RLE stronger than left)    General Comments General comments (skin integrity, edema, etc.): Pt requesting consistent therapy and asking for more frequent OT to address Left hand weakness. Explained frequency for OT with goal to move to a rehab center for increased therapies      Pertinent Vitals/Pain Pain Assessment: 0-10 Pain Score: 7  Pain Location: bil LEs Pain Descriptors / Indicators: Discomfort;Sore;Tender Pain Intervention(s): Limited activity within patient's tolerance;Premedicated before session;Monitored during session  Home Living                      Prior Function            PT Goals (current goals can now be found in the care plan section) Acute Rehab PT Goals Patient Stated Goal: to get my hand working Time For Goal Achievement: 04/16/20 Potential to Achieve Goals: Good Progress towards PT goals: Progressing toward goals    Frequency    Min 3X/week      PT Plan Current plan remains appropriate    Co-evaluation              AM-PAC PT "6 Clicks" Mobility   Outcome Measure  Help needed turning from your back to your side while in a flat bed without using bedrails?: A  Little Help needed moving from lying on your back to sitting on the side of a flat bed without using bedrails?: A Lot Help needed moving to and from a bed to a chair (including a wheelchair)?: A Lot Help needed standing up from a chair using your arms (e.g., wheelchair or bedside chair)?: A Lot Help needed to walk in hospital room?: Total Help needed climbing 3-5 steps with a railing? : Total 6 Click Score: 11    End of Session   Activity Tolerance: Patient tolerated treatment well Patient left: in bed;with call bell/phone within reach;with bed alarm set;with SCD's reapplied   PT Visit Diagnosis: Other abnormalities of gait and mobility (R26.89);Repeated falls (R29.6);Muscle weakness (generalized) (M62.81);Other symptoms and signs involving the nervous system (R29.898)     Time: 8119-1478 PT Time Calculation (min) (ACUTE ONLY): 31 min  Charges:  $Therapeutic Exercise: 23-37 mins                      [name], PT Pager [phone]    [name] 04/06/2020, 1:32 PM

## 2020-04-06 NOTE — Progress Notes (Signed)
PROGRESS NOTE    Nancy [name]  YWV:[id number] DOB: [birth year] DOA: 04/01/2020 PCP: Curlene Labrum, MD (  No chief complaint on file.   Brief Narrative:  34 yo F with hx of obesity, tobacco abuse, T2DM, mood disorder, and multiple other medical problems who presented initially about 1 month ago with left upper extremity weakness.  She notes her L arm and hand "stopped working".  She also notes incontinence and leg weakness prior to presentation.  She was seen by neurosurgery who admitted her for an expedited workup.  TR[facility] is picking her up on 9/8.  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  Follow oligoclonal bands.  Follow [place] serologies (pending). Neurology c/s, appreciate recs - IVIG per neurology x5 doses Neurosurgery, appreciate recs - needs EMG nerveconduction studies once discharged C/o pain related to this as well, continuing home oxycodone and lyrica.  She has IV dilaudid, discussed that we could add voltaren more frequently/lidocaine patch rather than increasing IV dilaudid first (she was not happy with this and requested we not change anything)  Hypertension: continue chlorthalidone, lisinopril - will d/c lasix in setting of  thiazide  T2DM: poorly controlled, a1c 14.6.  Lantus, mealtime, SSI.  Adjust and follow.  Hold glipizide while inpatient.   Chronic Pain: continue home oxycodone, lyrica.    Continue topamax  DVT prophylaxis: (SCD Code Status: full  Family Communication: none at bedside Disposition:   Status is: Inpatient  Remains inpatient appropriate because:Inpatient level of care appropriate due to severity of illness   Dispo: The patient is from: Home              Anticipated d/c is to: pending              Anticipated d/c date is: > 3 days              Patient currently is not medically stable to d/c.   Consultants:   Neurology  neurosurgery  Procedures:  LP 9/8  Antimicrobials: Anti-infectives (From admission, onward)   None      Subjective: Asking about pain medications Relieved we have etiology  Objective: Vitals:   04/06/20 0321 04/06/20 0841 04/06/20 1310 04/06/20 1647  BP: 105/67 120/76 109/61 116/63  Pulse: 93 93 96 87  Resp: 19 14 20 18   Temp: 98.1 F (36.7 C) 97.9 F (36.6 C) 97.7 F (36.5 C) 98.3 F (36.8 C)  TempSrc: Oral Oral Oral Oral  SpO2: 100% 97% 95% 96%  Weight:      Height:        Intake/Output Summary (Last 24 hours) at 04/06/2020 1752 Last data filed at 04/06/2020 1313 Gross per 24 hour  Intake 760 ml  Output 600 ml  Net 160 ml   Filed  Weights   04/05/20 1647  Weight: (!) 154.3 kg    Examination:  General: No acute distress. Cardiovascular: Heart sounds show Twala Collings regular rate, and rhythm.  Lungs: Clear to auscultation bilaterally. Abdomen: Soft, nontender, nondistended  Neurological: Alert and oriented 3. L wrist drop.  LUE weakness.  Moves all extremities 4. Cranial nerves II through XII grossly intact. Skin: Warm and dry. No rashes or lesions. Extremities: No clubbing or cyanosis. No edema.     Data Reviewed: I have personally reviewed following labs and imaging studies  CBC: Recent Labs  Lab 04/01/20 1300 04/01/20 1635  04/05/20 1334 04/06/20 0444  WBC 6.9 7.4 6.3 5.8  NEUTROABS  --  2.7  --  2.3  HGB 13.5 13.2 14.0 13.4  HCT 42.1 41.3 44.6 41.8  MCV 88.1 86.0 87.6 88.0  PLT 225 227 223 759    Basic Metabolic Panel: Recent Labs  Lab 04/01/20 1300 04/01/20 1635 04/05/20 1334 04/06/20 0444  NA 134* 133* 133* 135  K 4.3 4.4 4.1 3.5  CL 99 99 100 103  CO2 25 25 24 24   GLUCOSE 372* 453* 257* 300*  BUN 10 10 8 11   CREATININE 0.67 0.73 0.54 0.51  CALCIUM 9.3 9.1 9.5 8.9  MG  --   --   --  1.6*  PHOS  --   --   --  4.5    GFR: Estimated Creatinine Clearance: 157.3 mL/min (by C-G formula based on SCr of 0.51 mg/dL).  Liver Function Tests: Recent Labs  Lab 04/01/20 1635 04/05/20 1334 04/06/20 0444  AST 20 20 15   ALT 19 19 16   ALKPHOS 82 72 78  BILITOT 0.4 0.9 0.6  PROT 7.0 7.0 7.1  ALBUMIN 3.2* 3.0* 2.6*    CBG: Recent Labs  Lab 04/05/20 1631 04/05/20 2117 04/06/20 0632 04/06/20 1151 04/06/20 1646  GLUCAP 275* 274* 253* 295* 216*     Recent Results (from the past 240 hour(s))  SARS Coronavirus 2 by RT PCR (hospital order, performed in [facility] hospital lab) Nasopharyngeal Nasopharyngeal Swab     Status: None   Collection Time: 04/01/20  4:15 PM   Specimen: Nasopharyngeal Swab  Result Value Ref Range Status   SARS Coronavirus 2 NEGATIVE NEGATIVE Final    Comment: (NOTE) SARS-CoV-2 target nucleic acids are NOT DETECTED.  The SARS-CoV-2 RNA is generally detectable in upper and lower respiratory specimens during the acute phase of infection. The lowest concentration of SARS-CoV-2 viral copies this assay can detect is 250 copies / mL. Jahira Swiss negative result does not preclude SARS-CoV-2 infection and should not be used as the sole basis for treatment or other patient management decisions.  Raynell Scott negative result may occur with improper specimen collection / handling, submission of specimen other than nasopharyngeal swab, presence of viral mutation(s) within the areas targeted by  this assay, and inadequate number of viral copies (<250 copies / mL). Dalma Panchal negative result must be combined with clinical observations, patient history, and epidemiological information.  Fact Sheet for Patients:   StrictlyIdeas.no  Fact Sheet for Healthcare Providers: BankingDealers.co.za  This test is not yet approved or  cleared by the [place] FDA and has been authorized for detection and/or diagnosis of SARS-CoV-2 by FDA under an Emergency Use Authorization (EUA).  This EUA will remain in effect (meaning this test can be used) for the duration of the COVID-19 declaration under Section 564(b)(1) of the Act, 21 U.S.C. section 360bbb-3(b)(1), unless the authorization is terminated or revoked sooner.  Performed  at [facility], [place] [address] [place], [place] [address]   CSF culture     Status: None (Preliminary result)   Collection Time: 04/05/20 12:06 PM   Specimen: Aalliyah Kilker: PATH Cytology CSF; Cerebrospinal Fluid   B: PATH Cytology CSF; Cerebrospinal Fluid   C: PATH Cytology CSF; Cerebrospinal Fluid  Result Value Ref Range Status   Specimen Description CSF  Final   Special Requests NONE  Final   Gram Stain NO ORGANISMS SEEN  Final   Culture   Final    NO GROWTH < 24 HOURS Performed at [facility], [place] [address] [place],  [address]    Report Status PENDING  Incomplete         Radiology Studies: DG FL GUIDED LUMBAR PUNCTURE  Result Date: 04/05/2020 Sabino Dick, MD     04/05/2020 12:15 PM Under flouro guidance, LP performed at L4-5 level. 10 mls of slightly red-tinged CSF obtained. No immediate complication. Sabino Dick. MD       Scheduled Meds: . chlorthalidone  25 mg Oral BID  . diclofenac Sodium  2 g Topical QID  . docusate sodium  100 mg Oral BID  . insulin aspart  0-20 Units Subcutaneous TID WC  . insulin aspart  0-5 Units Subcutaneous QHS  . insulin aspart  6 Units Subcutaneous TID WC  .  insulin glargine  50 Units Subcutaneous QHS  . insulin starter kit- pen needles  1 kit Other Once  . lidocaine  1 patch Transdermal Q24H  . lisinopril  2.5 mg Oral Daily  . living well with diabetes book   Does not apply Once  . nicotine  21 mg Transdermal Daily  . potassium chloride SA  20 mEq Oral Daily  . pregabalin  300 mg Oral BID  . topiramate  50 mg Oral q AM   Continuous Infusions: . Immune Globulin 10% Stopped (04/05/20 2323)     LOS: 3 days    Time spent: over 69 min    Fayrene Helper, MD [facility]   To contact the attending provider between 7A-7P or the covering provider during after hours 7P-7A, please log into the web site [email or web address] and access using universal [place] password for that web site. If you do not have the password, please call the hospital operator.  04/06/2020, 5:52 PM

## 2020-04-06 NOTE — Progress Notes (Signed)
Orthopedic Tech Progress Note Patient Details:  Nancy [name] 05-Apr-1986 [id number]  Ortho Devices Type of Ortho Device: Velcro wrist splint Ortho Device/Splint Location: ULE Ortho Device/Splint Interventions: Ordered, Application   Post Interventions Patient Tolerated: Well Instructions Provided: Adjustment of device   Essa Wenk A Catcher Dehoyos 04/06/2020, 4:03 PM

## 2020-04-06 NOTE — Progress Notes (Signed)
Inpatient Rehabilitation Admissions Coordinator  Noted patient is inpatient and receiving IVIG. Rehab consult is recommended if patient fails to progress to be able to d/c home. Please advise.  Ottie Glazier, RN, MSN Rehab Admissions Coordinator [phone] 04/06/2020 2:52 PM

## 2020-04-07 DIAGNOSIS — R531 Weakness: Secondary | ICD-10-CM

## 2020-04-07 LAB — COMPREHENSIVE METABOLIC PANEL
ALT: 19 U/L (ref 0–44)
AST: 20 U/L (ref 15–41)
Albumin: 2.7 g/dL — ABNORMAL LOW (ref 3.5–5.0)
Alkaline Phosphatase: 62 U/L (ref 38–126)
Anion gap: 7 (ref 5–15)
BUN: 14 mg/dL (ref 6–20)
CO2: 25 mmol/L (ref 22–32)
Calcium: 8.9 mg/dL (ref 8.9–10.3)
Chloride: 102 mmol/L (ref 98–111)
Creatinine, Ser: 0.6 mg/dL (ref 0.44–1.00)
GFR calc Af Amer: >60 mL/min (ref >60)
GFR calc non Af Amer: >60 mL/min (ref >60)
Glucose, Bld: 240 mg/dL — ABNORMAL HIGH (ref 70–99)
Potassium: 3.6 mmol/L (ref 3.5–5.1)
Sodium: 134 mmol/L — ABNORMAL LOW (ref 135–145)
Total Bilirubin: 0.8 mg/dL (ref 0.3–1.2)
Total Protein: 7.7 g/dL (ref 6.5–8.1)

## 2020-04-07 LAB — GLUCOSE, CAPILLARY
Glucose-Capillary: 167 mg/dL — ABNORMAL HIGH (ref 70–99)
Glucose-Capillary: 256 mg/dL — ABNORMAL HIGH (ref 70–99)
Glucose-Capillary: 152 mg/dL — ABNORMAL HIGH (ref 70–99)
Glucose-Capillary: 147 mg/dL — ABNORMAL HIGH (ref 70–99)
Glucose-Capillary: 263 mg/dL — ABNORMAL HIGH (ref 70–99)

## 2020-04-07 LAB — CBC WITH DIFFERENTIAL/PLATELET
Abs Immature Granulocytes: 0.01 10*3/uL (ref 0.00–0.07)
Basophils Absolute: 0 10*3/uL (ref 0.0–0.1)
Basophils Relative: 1 %
Eosinophils Absolute: 0.2 10*3/uL (ref 0.0–0.5)
Eosinophils Relative: 3 %
HCT: 40.8 % (ref 36.0–46.0)
Hemoglobin: 12.9 g/dL (ref 12.0–15.0)
Immature Granulocytes: 0 %
Lymphocytes Relative: 54 %
Lymphs Abs: 3 10*3/uL (ref 0.7–4.0)
MCH: 28 pg (ref 26.0–34.0)
MCHC: 31.6 g/dL (ref 30.0–36.0)
MCV: 88.5 fL (ref 80.0–100.0)
Monocytes Absolute: 0.4 10*3/uL (ref 0.1–1.0)
Monocytes Relative: 7 %
Neutro Abs: 1.9 10*3/uL (ref 1.7–7.7)
Neutrophils Relative %: 35 %
Platelets: 215 10*3/uL (ref 150–400)
RBC: 4.61 MIL/uL (ref 3.87–5.11)
RDW: 12.6 % (ref 11.5–15.5)
WBC: 5.5 10*3/uL (ref 4.0–10.5)
nRBC: 0 % (ref 0.0–0.2)

## 2020-04-07 LAB — MAGNESIUM: Magnesium: 1.5 mg/dL — ABNORMAL LOW (ref 1.7–2.4)

## 2020-04-07 LAB — PHOSPHORUS: Phosphorus: 5.2 mg/dL — ABNORMAL HIGH (ref 2.5–4.6)

## 2020-04-07 MED ORDER — MAGNESIUM SULFATE 2 GM/50ML IV SOLN
2.0000 g | Freq: Once | INTRAVENOUS | Status: AC
  Administered 2020-04-07: 2 g via INTRAVENOUS
  Filled 2020-04-07: qty 50

## 2020-04-07 NOTE — Progress Notes (Signed)
Spoke with patient's nurse Bjorn Loser RN, regarding PIV consult. This patient has poor vasculature and limited site to place access. Patient has had multiple PIVs to date. Educated nurse on vein preservation. Instructed to contact MD as needed. VU. Tomasita Morrow, RN VAST

## 2020-04-07 NOTE — Progress Notes (Signed)
Inpatient Diabetes Program Recommendations  AACE/ADA: New Consensus Statement on Inpatient Glycemic Control (2015)  Target Ranges:  Prepandial:   less than 140 mg/dL      Peak postprandial:   less than 180 mg/dL (1-2 hours)      Critically ill patients:  140 - 180 mg/dL   Lab Results  Component Value Date   GLUCAP 167 (H) 04/07/2020   HGBA1C 14.6 (H) 04/02/2020    Review of Glycemic Control Results for Nancy [name], Nancy [name] (MRN [id number]) as of 04/07/2020 11:26  Ref. Range 04/06/2020 06:32 04/06/2020 11:51 04/06/2020 16:46 04/06/2020 21:08 04/07/2020 06:33  Glucose-Capillary Latest Ref Range: 70 - 99 mg/dL 010 (H) 932 (H) 355 (H) 232 (H) 167 (H)   Diabetes history:  DM2  Outpatient Diabetes medications:  Lantus 40 units daily (has not taken in over a year) Glipizide 5 mg bid (takes occasionally)  Current orders for Inpatient glycemic control:  Lantus 50 units qhs Novolog 0-20 units tid Novolog 0-5 units qhs Novolog 6 units tid  Inpatient Diabetes Program Recommendations:     If post prandials remain elevated please consider increasing meal coverage-Novolog 8 units tid with meals if eats at least 50% of meal  Will continue to follow while inpatient.  Thank you, Dulce Sellar, RN, BSN Diabetes Coordinator Inpatient Diabetes Program [phone] (team pager from 8a-5p)

## 2020-04-07 NOTE — Progress Notes (Signed)
Physical Therapy Treatment Patient Details Name: Nancy [name] MRN: [id number] DOB: 1986/04/11 Today's Date: 04/07/2020    History of Present Illness This 34 y.o. female admitted for evaluation of neck pain, and Lt UE weakness, and bil. LE weakness as well as accompanying neck pain and urinary incontinence. MRI of brain, cervical, thoracic, and lumbar spine negative for acute lesions. 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 Reports some improvement in LE strength. Performed bed mobility with mod independence supine to sit. Min assist for sit to supine to bring LEs up onto bed. Transfers with mod to max +2 assist with bed elevated. Performed stand pivot transfer from wheelchair to bed with bed rail and supervision. Patient stood x 2 with RW and then stood with EVA walker. Practiced weight shifting in standing and then was able to ambulate 10 feet with EVA walker and mod +2 assist with wheelchair to follow. She was able to self propel wheelchair x 100 feet with B UEs and LEs.  Patient is making good progress and is very motivated to improve. She will continue to benefit from skilled PT while here to increase LE strength, safety and functional independence.      Follow Up Recommendations  CIR     Equipment Recommendations  Wheelchair (measurements PT);Wheelchair cushion (measurements PT);Rolling walker with 5" wheels    Recommendations for Other Services Rehab consult     Precautions / Restrictions Precautions Precautions: Fall Precaution Comments: reports falls "a lot" at home; describes knees buckling Restrictions Weight Bearing  Restrictions: No    Mobility  Bed Mobility Overal bed mobility: Needs Assistance Bed Mobility: Supine to Sit     Supine to sit: Min guard Sit to supine: Min assist Sit to sidelying: Modified independent (Device/Increase time) General bed mobility comments: heavy use of bed rail, required assist to bring LEs back up onto bed.  Transfers Overall transfer level: Needs assistance Equipment used: Rolling walker (2 wheeled) Transfers: Sit to/from Stand Sit to Stand: Mod assist;From elevated surface;+2 physical assistance;+2 safety/equipment         General transfer comment: mod assist +2 with RW, min assist +2 to EVA walker  Ambulation/Gait Ambulation/Gait assistance: Mod assist;+2 physical assistance Gait Distance (Feet): 10 Feet Assistive device: Rolling walker (2 wheeled) Gait Pattern/deviations: Step-to pattern;Decreased stride length;Drifts right/left;Decreased step length - right;Decreased step length - left Gait velocity: decreased   General Gait Details: Mod assist to control EVA walker ( slow it down). Wheelchair to follow,   Psychologist, counselling mobility: Yes Wheelchair propulsion: Both lower extermities;Both upper extremities Wheelchair parts: Needs assistance Distance: 100 feet Wheelchair Assistance Details (indicate cue type and reason): requires cues for using LEs when UEs fatigued, cues to lock bracks prior to standing.  Modified Rankin (Stroke Patients Only)       Balance Overall balance assessment: Needs assistance Sitting-balance support: Feet supported Sitting balance-Leahy Scale: Good     Standing balance support: Bilateral upper extremity supported;During functional activity Standing balance-Leahy Scale: Poor Standing balance comment: Very wobbly on feet. Poor trunk contol. Improved with EVA walker vs rolling walker.  Cognition Arousal/Alertness:  Awake/alert Behavior During Therapy: WFL for tasks assessed/performed Overall Cognitive Status: Within Functional Limits for tasks assessed                                        Exercises Other Exercises Other Exercises: heel slides, hip abd/add, AP x 5 reps each. Required min assist to get going, but is able to do independently for the most part.    General Comments        Pertinent Vitals/Pain Pain Assessment: 0-10 Pain Score: 9  (at end of session) Pain Location: bil LEs Pain Descriptors / Indicators: Discomfort;Sore Pain Intervention(s): Premedicated before session    Home Living Family/patient expects to be discharged to:: Private residence                    Prior Function            PT Goals (current goals can now be found in the care plan section) Acute Rehab PT Goals Patient Stated Goal: to get my hand working PT Goal Formulation: With patient Time For Goal Achievement: 04/16/20 Potential to Achieve Goals: Good Additional Goals Additional Goal #1: Patient will demonstrate knowledge of wheelchair parts (may need assist to use). Patient will self-propel x 100 ft with supervision, including simulated home environment Progress towards PT goals: Progressing toward goals    Frequency    Min 3X/week      PT Plan Current plan remains appropriate    Co-evaluation PT/OT/SLP Co-Evaluation/Treatment: Yes Reason for Co-Treatment: To address functional/ADL transfers;For patient/therapist safety PT goals addressed during session: Mobility/safety with mobility;Balance;Proper use of DME        AM-PAC PT "6 Clicks" Mobility   Outcome Measure  Help needed turning from your back to your side while in a flat bed without using bedrails?: A Little Help needed moving from lying on your back to sitting on the side of a flat bed without using bedrails?: A Little Help needed moving to and from a bed to a chair (including a wheelchair)?: A Lot Help  needed standing up from a chair using your arms (e.g., wheelchair or bedside chair)?: A Lot Help needed to walk in hospital room?: A Lot Help needed climbing 3-5 steps with a railing? : Total 6 Click Score: 13    End of Session Equipment Utilized During Treatment: Gait belt Activity Tolerance: Patient tolerated treatment well;Patient limited by fatigue;Patient limited by pain Patient left: in bed;with bed alarm set;with call bell/phone within reach Nurse Communication: Mobility status PT Visit Diagnosis: Other abnormalities of gait and mobility (R26.89);Repeated falls (R29.6);Muscle weakness (generalized) (M62.81);Other symptoms and signs involving the nervous system (R29.898)     Time: 2956-2130 PT Time Calculation (min) (ACUTE ONLY): 60 min  Charges:  $Gait Training: 23-37 mins $Therapeutic Exercise: 23-37 mins                     Channon Ambrosini, PT, GCS 04/07/20,12:14 PM

## 2020-04-07 NOTE — Progress Notes (Signed)
Pt continues to be highly motivated. Demonstrating more L finger movement, but wrist remains weak. Pt now has a wrist cock up splint--education provided. Pt able to ambulate with +2 assist and Eva walker and propel w/c with all extremities in hallway. Pt pleased with her progress.    04/07/20 1300  OT Visit Information  Last OT Received On 04/07/20  Assistance Needed +2  PT/OT/SLP Co-Evaluation/Treatment Yes  Reason for Co-Treatment For patient/therapist safety  OT goals addressed during session Strengthening/ROM  History of Present Illness This 34 y.o. female admitted for evaluation of neck pain, and Lt UE weakness, and bil. LE weakness as well as accompanying neck pain and urinary incontinence. MRI of brain, cervical, thoracic, and lumbar spine negative for acute lesions. 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), DM, OSA  Precautions  Precautions Fall  Pain Assessment  Pain Assessment 0-10  Faces Pain Scale 8  Pain Location bil LEs  Pain Descriptors / Indicators Discomfort;Sore  Pain Intervention(s) Monitored during session;Repositioned  Cognition  Arousal/Alertness Awake/alert  Behavior During Therapy WFL for tasks assessed/performed  Overall Cognitive Status Within Functional Limits for tasks assessed  Upper Extremity Assessment  Upper Extremity Assessment LUE deficits/detail  LUE Deficits / Details pt now with wrist cockup splint, educated in donning and doffing and use during the day, to watch for skin problems and edema  Bed Mobility  Overal bed mobility Needs Assistance  Bed Mobility Rolling;Sidelying to Sit;Sit to Sidelying  Rolling Supervision  Sidelying to sit Supervision  Sit to sidelying  Min assist  General bed mobility comments assist for LEs back into bed, use of bed rail  Balance  Overall balance assessment Needs assistance  Sitting-balance support Feet supported  Sitting balance-Leahy Scale Good  Standing balance support Bilateral upper extremity supported;During functional activity  Standing balance-Leahy Scale Poor  Standing balance comment Very wobbly on feet. Poor trunk contol. Improved with EVA walker vs rolling walker.  Transfers  Overall transfer level Needs assistance  Equipment used Rolling walker (2 wheeled) Therapist, sports)  Transfers Sit to/from Stand  Sit to Stand Mod assist;From elevated surface;+2 physical assistance;+2 safety/equipment  General transfer comment +2 min to mod assist depending on surface height, use of momentum  Other Exercises  Other Exercises wrist, finger AAROM gravity eliminated with facilitation  OT - End of Session  Activity Tolerance Patient tolerated treatment well  Patient left in bed;with call bell/phone within reach;with bed alarm set  OT Assessment/Plan  OT Plan Discharge plan remains appropriate  OT Visit Diagnosis Unsteadiness on feet (R26.81);Pain  OT Frequency (ACUTE ONLY) Min 2X/week  Follow Up Recommendations CIR  OT Equipment 3 in 1 bedside commode  AM-PAC OT "6 Clicks" Daily Activity Outcome Measure (Version 2)  Help from another person eating meals? 3  Help from another person taking care of personal grooming? 3  Help from another person toileting, which includes using toliet, bedpan, or urinal? 2  Help from another person bathing (including washing, rinsing, drying)? 2  Help from another person to put on and taking off regular upper body clothing? 3  Help from another person to put on and taking off regular lower body clothing? 1  6 Click Score 14  OT Goal Progression  Progress towards OT goals Progressing toward goals  Acute  Rehab OT Goals  Patient Stated Goal to get my hand working  OT Goal Formulation With  patient  Time For Goal Achievement 04/16/20  Potential to Achieve Goals Good  OT Time Calculation  OT Start Time (ACUTE ONLY) 1040  OT Stop Time (ACUTE ONLY) 1146  OT Time Calculation (min) 66 min  OT General Charges  $OT Visit 1 Visit  OT Treatments  $Neuromuscular Re-education 23-37 mins  Martie Round, OTR/L Acute Rehabilitation Services Pager: [phone] Office: [phone]

## 2020-04-07 NOTE — Consult Note (Addendum)
Physical Medicine and Rehabilitation Consult Reason for Consult: Bilateral lower extremity pain and weakness Referring Physician: [facility]   HPI: Nancy [name] is a 34 y.o. right-handed female with history of poorly controlled diabetes mellitus, anxiety, bipolar disorder, diastolic congestive heart failure, diabetes mellitus, tobacco abuse.  Per chart review patient lives with spouse.  Two-level home bed and bath upstairs.  One-step to entry.  Patient was able to with a straight point cane with noted falls for the past few weeks.  Presented 03/31/2020 with bilateral lower extremity weakness x4 weeks and some urinary incontinence.  MRI of the brain negative.  MRI cervical spine with some cervical lordosis otherwise normal study.  MRI thoracic lumbar spine negative.  Admission chemistries unremarkable except sodium 134, glucose 372, hemoglobin A1c 14.6, SARS current virus negative.  Lumbar puncture completed showed elevated protein 167.  Neurology consulted work-up suggestive of AIDP or CIDP.  Patient was placed on IVIG x5 days recommendations of outpatient EMG study.  Therapy evaluations completed with recommendations of physical medicine rehab consult.   Review of Systems  Constitutional: Negative for chills and fever.  HENT: Negative for hearing loss.   Eyes: Negative for blurred vision and double vision.  Respiratory: Negative for shortness of breath.   Cardiovascular: Negative for chest pain, palpitations and leg swelling.  Gastrointestinal: Positive for constipation. Negative for heartburn, nausea and vomiting.  Genitourinary: Negative for hematuria.       Bouts of urinary incontinence  Musculoskeletal: Positive for joint pain and myalgias.  Skin: Negative for rash.  Neurological: Positive for sensory change and weakness.  Psychiatric/Behavioral:       Anxiety, bipolar disorder  All other systems reviewed and are negative.  Past Medical History:  Diagnosis Date  . Anxiety   .  Bipolar disorder (HCC)   . CHF (congestive heart failure) (HCC)   . CIN I (cervical intraepithelial neoplasia I)   . Diabetes mellitus without complication (HCC)   . Enlarged heart   . Nephrolithiasis   . OSA (obstructive sleep apnea)   . Tachycardia    Past Surgical History:  Procedure Laterality Date  . BLADDER AUGMENTATION  2001   enlargement  . DILITATION & CURRETTAGE/HYSTROSCOPY WITH THERMACHOICE ABLATION N/A 07/06/2013   Procedure: DILATATION & CURETTAGE/HYSTEROSCOPY WITH THERMACHOICE ABLATION;  Surgeon: Tilda Burrow, MD;  Location: [facility];  Service: Gynecology;  Laterality: N/A;  total therapt time- 9 minutes 2 seconds; 87C  . LAPAROSCOPIC BILATERAL SALPINGECTOMY Bilateral 07/06/2013   Procedure: LAPAROSCOPIC BILATERAL SALPINGECTOMY;  Surgeon: Tilda Burrow, MD;  Location: [facility];  Service: Gynecology;  Laterality: Bilateral;   Family History  Problem Relation Age of Onset  . Hypertension Mother   . Depression Mother   . Diabetes Mother   . Cancer Mother        brain tumor  . Hypertension Father   . Diabetes Father   . Depression Father   . Hypertension Sister   . Diabetes Sister   . Depression Sister   . Depression Brother   . Diabetes Brother   . Hypertension Brother   . Cancer Maternal Aunt   . Cancer Paternal Aunt   . Hypertension Maternal Grandmother   . Diabetes Maternal Grandmother   . Depression Maternal Grandmother   . Hypertension Maternal Grandfather   . Diabetes Maternal Grandfather   . Depression Maternal Grandfather   . Hypertension Paternal Grandmother   . Diabetes Paternal Grandmother   . Depression Paternal Grandmother   . Hypertension Paternal  Grandfather   . Diabetes Paternal Grandfather   . Depression Paternal Grandfather    Social History:  reports that she has been smoking cigarettes. She has been smoking about 1.50 packs per day. She has never used smokeless tobacco. She reports current alcohol use. She reports that she does not use  drugs. Allergies:  Allergies  Allergen Reactions  . Asa Buff (Mag [Buffered Aspirin] Anaphylaxis, Shortness Of Breath, Swelling and Other (See Comments)    "Made my throat become swollen to the point of closing"  . Aspirin Anaphylaxis, Shortness Of Breath, Swelling and Other (See Comments)    "Made my throat become swollen to the point of closing"  . Tolmetin Shortness Of Breath and Other (See Comments)    TOLECTIN (tolmetin sodium) is indicated for the relief of signs and symptoms of rheumatoid arthritis and osteoarthritis  . Prozac [Fluoxetine Hcl] Other (See Comments)    Reaction not recalled   Medications Prior to Admission  Medication Sig Dispense Refill  . chlorthalidone (HYGROTON) 25 MG tablet Take 25 mg by mouth See admin instructions. Take 25 mg by mouth two times a day as long as the Systolic number is 140 or greater    . cyclobenzaprine (FLEXERIL) 10 MG tablet Take 10 mg by mouth in the morning, at noon, and at bedtime.     . diclofenac Sodium (VOLTAREN) 1 % GEL Apply 2-8 g topically See admin instructions. Apply 2-8 grams to each leg nightly    . EPINEPHrine 0.3 mg/0.3 mL IJ SOAJ injection Inject 0.3 mg into the muscle as needed for anaphylaxis.     Marland Kitchen etodolac (LODINE XL) 600 MG 24 hr tablet Take 600 mg by mouth daily at 12 noon.     . furosemide (LASIX) 40 MG tablet Take 40 mg by mouth in the morning.     Marland Kitchen glipiZIDE (GLUCOTROL) 5 MG tablet Take 5 mg by mouth 2 (two) times daily before a meal.     . insulin glargine (LANTUS) 100 UNIT/ML injection Inject 0.1 mLs (10 Units total) into the skin at bedtime. (Patient taking differently: Inject 40 Units into the skin at bedtime. ) 10 mL 11  . lisinopril (ZESTRIL) 2.5 MG tablet Take 2.5 mg by mouth daily.    . Oxycodone HCl 20 MG TABS Take 20 mg by mouth in the morning, at noon, and at bedtime.     . potassium chloride SA (KLOR-CON M20) 20 MEQ tablet Take 20 mEq by mouth daily.    . pregabalin (LYRICA) 300 MG capsule Take 300 mg by  mouth 2 (two) times daily.    Marland Kitchen topiramate (TOPAMAX) 50 MG tablet Take 50 mg by mouth in the morning.    Marland Kitchen oxyCODONE (ROXICODONE) 15 MG immediate release tablet Take 15 mg by mouth every 6 (six) hours as needed for pain. (Patient not taking: Reported on 04/01/2020)    . potassium chloride (KLOR-CON) 10 MEQ tablet Take 10 mEq by mouth daily. (Patient not taking: Reported on 04/01/2020)    . potassium chloride SA (KLOR-CON) 20 MEQ tablet Take 20 mEq by mouth daily. (Patient not taking: Reported on 04/01/2020)    . topiramate (TOPAMAX) 100 MG tablet Take 100 mg by mouth daily.      Home: Home Living Family/patient expects to be discharged to:: Private residence Living Arrangements: Spouse/significant other Available Help at Discharge: Family, Available 24 hours/day Type of Home: Other(Comment) (townhouse ) Home Access: Stairs to enter [employer] of Steps: 1 Home Layout: Two level, Bed/bath  upstairs, 1/2 bath on main level Alternate Level Stairs-Number of Steps: 16 Bathroom Toilet: Handicapped height Home Equipment: Cane - single point Additional Comments: She reports she has been sleeping on the couch   Functional History: Prior Function Level of Independence: Needs assistance Gait / Transfers Assistance Needed: Pt reports she has been ambulating with SPC, but has been having fequent falls.  Spouse helps her with ambulation  ADL's / Homemaking Assistance Needed: She reports spouse has been having to assist her with LB ADLs at home for the past month.  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 Transfers Overall transfer level: Needs assistance Equipment used: Rolling walker (2 wheeled) Transfer via Lift Equipment: Stedy Transfers: Sit to/from Stand Sit to Stand: Mod assist, From elevated surface, +2 physical assistance, +2 safety/equipment  Lateral/Scoot Transfers: Min guard General transfer comment: mod assist +2 with RW, min assist +2 to EVA walker Ambulation/Gait Ambulation/Gait assistance: Mod assist, +2 physical assistance Gait Distance (Feet): 10 Feet Assistive device: Rolling walker (2 wheeled) Gait Pattern/deviations: Step-to pattern, Decreased stride length, Drifts right/left, Decreased step length - right, Decreased step length - left General Gait Details: Mod assist to control EVA walker ( slow it down). Wheelchair to follow, Gait velocity: decreased Wheelchair Mobility Wheelchair mobility: Yes Wheelchair propulsion: Both lower extermities, Both upper extremities Wheelchair parts: Needs assistance Distance: 100 feet Wheelchair Assistance Details (indicate cue type and reason): requires cues for using LEs when UEs fatigued, cues to lock bracks prior to standing.  ADL: ADL Overall ADL's : Needs assistance/impaired Eating/Feeding: Modified independent, Bed level Grooming: Wash/dry hands, Wash/dry face, Oral care, Set up, Sitting Grooming Details (indicate cue type and reason): using Rt UE as dominant  Upper Body Bathing: Set up, Supervision/ safety, Sitting Lower Body Bathing: Moderate assistance, Sit to/from stand Lower Body Bathing Details (indicate cue type and reason): using Rt UE as dominant  Upper Body Dressing : Minimal assistance, Sitting Lower Body Dressing: Total assistance, Bed level Lower Body Dressing Details (indicate cue type and reason): socks Toilet Transfer: Minimal assistance, Stand-pivot, RW, BSC Toilet Transfer Details (indicate cue type and reason): assist to sit to stand and for transfer  Toileting- Clothing Manipulation and  Hygiene: Moderate assistance, Sit to/from stand Functional mobility during ADLs: Moderate assistance, Rolling walker General ADL Comments: focus on LUE HEP  Cognition: Cognition Overall Cognitive Status: Within Functional Limits for tasks assessed Orientation Level: Oriented X4 Cognition Arousal/Alertness: Awake/alert Behavior During Therapy: WFL for tasks assessed/performed Overall Cognitive Status: Within Functional Limits for tasks assessed  Blood pressure 104/70, pulse 84, temperature 98 F (36.7 C), temperature source Oral, resp. rate 16, height 5\' 7"  (1.702 m), weight (!) 154.3 kg, SpO2 98 %.  General: Alert and oriented x 3, No apparent distress HEENT: Head is normocephalic, atraumatic, PERRLA, EOMI, sclera anicteric, oral mucosa pink and moist, dentition intact, ext ear canals clear,  Neck: Supple without JVD or lymphadenopathy Heart: Reg rate and rhythm. No murmurs rubs or gallops Chest: CTA bilaterally without wheezes, rales, or rhonchi; no distress Abdomen: Soft, non-tender, non-distended, bowel sounds positive. Extremities: Bilateral lower extremity edema Skin: Clean and intact without signs of breakdown  Neuro: Pt is cognitively appropriate with normal insight, memory, and awareness. Cranial nerves 2-12 are intact. Sensation is decreased on left forearm and hand. No tremors. Motor function is grossly 5/5 strength in right arm, left arm 5/5 except for left wrist drop. Bilateral lower extremity strength is 3/5 with the exception of 4/5 bilateral foot drop.  Psych: Pt's affect is appropriate. Pt is cooperative   Results for orders placed or performed during the hospital encounter of 04/01/20 (from the past 24 hour(s))  Glucose, capillary     Status: Abnormal   Collection Time: 04/06/20  4:46 PM  Result Value Ref Range   Glucose-Capillary 216 (H) 70 - 99 mg/dL   Comment 1 Notify RN    Comment 2 Document in Chart   Glucose, capillary     Status: Abnormal   Collection Time:  04/06/20  9:08 PM  Result Value Ref Range   Glucose-Capillary 232 (H) 70 - 99 mg/dL  CBC with Differential/Platelet     Status: None   Collection Time: 04/07/20  2:01 AM  Result Value Ref Range   WBC 5.5 4.0 - 10.5 K/uL   RBC 4.61 3.87 - 5.11 MIL/uL   Hemoglobin 12.9 12.0 - 15.0 g/dL   HCT 16.1 36 - 46 %   MCV 88.5 80.0 - 100.0 fL   MCH 28.0 26.0 - 34.0 pg   MCHC 31.6 30.0 - 36.0 g/dL   RDW 09.6 04.5 - 40.9 %   Platelets 215 150 - 400 K/uL   nRBC 0.0 0.0 - 0.2 %   Neutrophils Relative % 35 %   Neutro Abs 1.9 1.7 - 7.7 K/uL   Lymphocytes Relative 54 %   Lymphs Abs 3.0 0.7 - 4.0 K/uL   Monocytes Relative 7 %   Monocytes Absolute 0.4 0 - 1 K/uL   Eosinophils Relative 3 %   Eosinophils Absolute 0.2 0 - 0 K/uL   Basophils Relative 1 %   Basophils Absolute 0.0 0 - 0 K/uL   Immature Granulocytes 0 %   Abs Immature Granulocytes 0.01 0.00 - 0.07 K/uL  Comprehensive metabolic panel     Status: Abnormal   Collection Time: 04/07/20  2:01 AM  Result Value Ref Range   Sodium 134 (L) 135 - 145 mmol/L   Potassium 3.6 3.5 - 5.1 mmol/L   Chloride 102 98 - 111 mmol/L   CO2 25 22 - 32 mmol/L   Glucose, Bld 240 (H) 70 - 99 mg/dL   BUN 14 6 - 20 mg/dL   Creatinine, Ser 8.11 0.44 - 1.00 mg/dL   Calcium 8.9 8.9 - 91.4 mg/dL   Total Protein 7.7 6.5 - 8.1 g/dL   Albumin 2.7 (L) 3.5 - 5.0 g/dL   AST 20 15 - 41 U/L   ALT 19 0 - 44 U/L   Alkaline Phosphatase 62 38 - 126 U/L   Total Bilirubin 0.8 0.3 - 1.2 mg/dL   GFR calc non Af Amer >60 >60 mL/min   GFR calc Af Amer >60 >60 mL/min   Anion gap 7 5 - 15  Magnesium     Status: Abnormal   Collection Time: 04/07/20  2:01 AM  Result Value Ref Range   Magnesium 1.5 (L) 1.7 - 2.4 mg/dL  Phosphorus     Status: Abnormal   Collection Time: 04/07/20  2:01 AM  Result Value Ref Range   Phosphorus 5.2 (H) 2.5 - 4.6 mg/dL  Glucose, capillary     Status: Abnormal   Collection  Time: 04/07/20  6:33 AM  Result Value Ref Range   Glucose-Capillary 167 (H)  70 - 99 mg/dL  Glucose, capillary     Status: Abnormal   Collection Time: 04/07/20 12:05 PM  Result Value Ref Range   Glucose-Capillary 256 (H) 70 - 99 mg/dL   No results found.   Assessment/Plan: Diagnosis: AIDP 1. Does the need for close, 24 hr/day medical supervision in concert with the patient's rehab needs make it unreasonable for this patient to be served in a less intensive setting? Yes 2. Co-Morbidities requiring supervision/potential complications: active smoker, nausea, constipation, chronic lower back pain, hypokalemia, obesity (BMI 52.38) 3. Due to bladder management, bowel management, safety, skin/wound care, disease management, medication administration, pain management and patient education, does the patient require 24 hr/day rehab nursing? Yes 4. Does the patient require coordinated care of a physician, rehab nurse, therapy disciplines of PT, OT to address physical and functional deficits in the context of the above medical diagnosis(es)? Yes Addressing deficits in the following areas: balance, endurance, locomotion, strength, transferring, bowel/bladder control, bathing, dressing, feeding, grooming, toileting and psychosocial support 5. Can the patient actively participate in an intensive therapy program of at least 3 hrs of therapy per day at least 5 days per week? Yes 6. The potential for patient to make measurable gains while on inpatient rehab is excellent 7. Anticipated functional outcomes upon discharge from inpatient rehab are modified independent  with PT, modified independent with OT, independent with SLP. 8. Estimated rehab length of stay to reach the above functional goals is: 12-16 days 9. Anticipated discharge destination: Home 10. Overall Rehab/Functional Prognosis: excellent  RECOMMENDATIONS: This patient's condition is appropriate for continued rehabilitative care in the following setting: CIR Patient has agreed to participate in recommended program.  Yes Note that insurance prior authorization may be required for reimbursement for recommended care.  Comment: Thank you for this consult. Admission coordinator to follow.   I have personally performed a face to face diagnostic evaluation, including, but not limited to relevant history and physical exam findings, of this patient and developed relevant assessment and plan.  Additionally, I have reviewed and concur with the physician assistant's documentation above.  Sula Soda, MD  Mcarthur Rossetti Angiulli, PA-C 04/07/2020

## 2020-04-07 NOTE — Progress Notes (Signed)
PROGRESS NOTE    Nancy [name]  SWH:[id number] DOB: 1986/07/07 DOA: 04/01/2020 PCP: Curlene Labrum, MD (  No chief complaint on file.   Brief Narrative:  34 yo F with hx of obesity, tobacco abuse, T2DM, mood disorder, and multiple other medical problems who presented initially about 1 month ago with left upper extremity weakness.  She notes her L arm and hand "stopped working".  She also notes incontinence and leg weakness prior to presentation.  She was seen by neurosurgery who admitted her for an expedited workup.  [facility] is picking her up on 9/8.  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  Then likely will need inpatient rehab.    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  Follow oligoclonal bands.  Follow [place] serologies (pending). Neurology c/s, appreciate recs - IVIG per neurology x5 doses Neurosurgery, appreciate recs - needs EMG nerveconduction studies once discharged C/o pain related to this as well, continuing home oxycodone and lyrica.  IV dilaudid for breaththrough.  Voltaren, lidocaine patch.   Hypertension: continue chlorthalidone, lisinopril - will d/c lasix in setting of thiazide  T2DM: poorly controlled, a1c 14.6.  Lantus, mealtime, SSI.   Adjust and follow.  Hold glipizide while inpatient. Diabetes education   Will need insulin at discharge  Chronic Pain: continue home oxycodone, lyrica.    Continue topamax  DVT prophylaxis: (SCD Code Status: full  Family Communication: none at bedside Disposition:   Status is: Inpatient  Remains inpatient appropriate because:Inpatient level of care appropriate due to severity of illness   Dispo: The patient is from: Home              Anticipated d/c is to: pending              Anticipated d/c date is: > 3 days              Patient currently is not medically stable to d/c.   Consultants:   Neurology  neurosurgery  Procedures:  LP 9/8  Antimicrobials: Anti-infectives (From admission, onward)   None      Subjective: No new complaints  Objective: Vitals:   04/07/20 0411 04/07/20 0840 04/07/20 1229 04/07/20 1539  BP: 91/64 101/60 104/70 120/70  Pulse: 83 86 84 94  Resp: _0 Temp: 98.3 F (36.8 C) 98.6 F (37 C) 98 F (36.7 C) 98.6 F (37 C)  TempSrc: Oral Oral Oral Oral  SpO2: 98% 98% 98% 100%  Weight:      Height:        Intake/Output Summary (Last 24 hours) at 04/07/2020 1900 Last data filed at 04/06/2020 2200 Gross per 24 hour  Intake 100 ml  Output --  Net 100 ml   Filed Weights   04/05/20 1647  Weight: (!) 154.3 kg    Examination:  General: No acute distress. Cardiovascular: Heart sounds show Tieasha Larsen regular rate, and rhythm. Lungs: Clear to auscultation bilaterally  Abdomen: Soft, nontender, nondistended Neurological: Alert and oriented 3. L wrist drop.  Antigravity to lower extremities, improved. Cranial nerves II through XII grossly intact. Skin: Warm and dry. No rashes or lesions. Extremities: No clubbing or cyanosis. No edema.    Data Reviewed: I have personally reviewed following labs and imaging studies  CBC: Recent Labs  Lab 04/01/20 1300 04/01/20 1635 04/05/20 1334 04/06/20 0444 04/07/20 0201  WBC 6.9 7.4 6.3 5.8 5.5   NEUTROABS  --  2.7  --  2.3 1.9  HGB 13.5 13.2 14.0 13.4 12.9  HCT 42.1 41.3 44.6 41.8 40.8  MCV 88.1 86.0 87.6 88.0 88.5  PLT 225 227 223 210 072    Basic Metabolic Panel: Recent Labs  Lab 04/01/20 1300 04/01/20 1635 04/05/20 1334 04/06/20 0444 04/07/20 0201  NA 134* 133* 133* 135 134*  K 4.3 4.4 4.1 3.5 3.6  CL 99 99 100 103 102  CO2 _0 GLUCOSE 372* 453* 257* 300* 240*  BUN _1 CREATININE 0.67 0.73 0.54 0.51 0.60  CALCIUM 9.3 9.1 9.5 8.9 8.9  MG  --   --   --  1.6* 1.5*  PHOS  --   --   --  4.5 5.2*    GFR: Estimated Creatinine Clearance: 157.3 mL/min (by C-G formula based on SCr of 0.6 mg/dL).  Liver Function Tests: Recent Labs  Lab 04/01/20 1635 04/05/20 1334 04/06/20 0444 04/07/20 0201  AST _2 ALT _3 ALKPHOS 82 72 78 62  BILITOT 0.4 0.9 0.6 0.8  PROT 7.0 7.0 7.1 7.7  ALBUMIN 3.2* 3.0* 2.6* 2.7*    CBG: Recent Labs  Lab 04/06/20 2108 04/07/20 0633 04/07/20 1205 04/07/20 1525 04/07/20 1746  GLUCAP 232* 167* 256* 152* 147*     Recent Results (from the past 240 hour(s))  SARS Coronavirus 2 by RT PCR (hospital order, performed in [facility] hospital lab) Nasopharyngeal Nasopharyngeal Swab     Status: None   Collection Time: 04/01/20  4:15 PM   Specimen: Nasopharyngeal Swab  Result Value Ref Range Status   SARS Coronavirus 2 NEGATIVE NEGATIVE Final    Comment: (NOTE) SARS-CoV-2 target nucleic acids are NOT DETECTED.  The SARS-CoV-2 RNA is generally detectable in upper and lower respiratory specimens during the acute phase of infection. The lowest concentration of SARS-CoV-2 viral copies this assay can detect is 250 copies / mL. Dezmen Alcock negative result does not preclude SARS-CoV-2 infection and should not be used as the sole basis for treatment or other patient management decisions.  Skyanne Welle negative result may occur with improper specimen collection / handling, submission of specimen other than nasopharyngeal  swab, presence of viral mutation(s) within the areas targeted by this assay, and inadequate number of viral copies (<250 copies / mL). Cecile Gillispie negative result must be combined with clinical observations, patient history, and epidemiological information.  Fact Sheet for Patients:   StrictlyIdeas.no  Fact Sheet for Healthcare Providers: BankingDealers.co.za  This test is not yet approved or  cleared by the [place] FDA and has been authorized for detection and/or diagnosis of SARS-CoV-2 by FDA under an Emergency Use Authorization (EUA).  This EUA will remain in effect (meaning this test can be used) for the duration of the COVID-19 declaration under Section  564(b)(1) of the Act, 21 U.S.C. section 360bbb-3(b)(1), unless the authorization is terminated or revoked sooner.  Performed at [facility], [place] [address], [place], [place] [address]   CSF culture     Status: None (Preliminary result)   Collection Time: 04/05/20 12:06 PM   Specimen: Jenya Putz: PATH Cytology CSF; Cerebrospinal Fluid   B: PATH Cytology CSF; Cerebrospinal Fluid   C: PATH Cytology CSF; Cerebrospinal Fluid  Result Value Ref Range Status   Specimen Description CSF  Final   Special Requests NONE  Final   Gram Stain NO ORGANISMS SEEN  Final   Culture   Final    NO GROWTH 2 DAYS Performed at [facility] [address], [place], [place] [address]    Report Status PENDING  Incomplete         Radiology Studies: No results found.      Scheduled Meds: . chlorthalidone  25 mg Oral BID  . diclofenac Sodium  2 g Topical QID  . docusate sodium  100 mg Oral BID  . insulin aspart  0-20 Units Subcutaneous TID WC  . insulin aspart  0-5 Units Subcutaneous QHS  . insulin aspart  6 Units Subcutaneous TID WC  . insulin glargine  50 Units Subcutaneous QHS  . insulin starter kit- pen needles  1 kit Other Once  . lidocaine  1 patch Transdermal Q24H  . lisinopril  2.5  mg Oral Daily  . living well with diabetes book   Does not apply Once  . nicotine  21 mg Transdermal Daily  . potassium chloride SA  20 mEq Oral Daily  . pregabalin  300 mg Oral BID  . topiramate  50 mg Oral q AM   Continuous Infusions: . Immune Globulin 10% 40 g (04/06/20 1830)     LOS: 4 days    Time spent: over 82 min    Fayrene Helper, MD [facility]   To contact the attending provider between 7A-7P or the covering provider during after hours 7P-7A, please log into the web site [email or web address] and access using universal [place] password for that web site. If you do not have the password, please call the hospital operator.  04/07/2020, 7:00 PM

## 2020-04-08 LAB — CBC WITH DIFFERENTIAL/PLATELET
Abs Immature Granulocytes: 0.01 10*3/uL (ref 0.00–0.07)
Basophils Absolute: 0 10*3/uL (ref 0.0–0.1)
Basophils Relative: 1 %
Eosinophils Absolute: 0.1 10*3/uL (ref 0.0–0.5)
Eosinophils Relative: 2 %
HCT: 38.1 % (ref 36.0–46.0)
Hemoglobin: 12 g/dL (ref 12.0–15.0)
Immature Granulocytes: 0 %
Lymphocytes Relative: 54 %
Lymphs Abs: 3.2 10*3/uL (ref 0.7–4.0)
MCH: 27.8 pg (ref 26.0–34.0)
MCHC: 31.5 g/dL (ref 30.0–36.0)
MCV: 88.2 fL (ref 80.0–100.0)
Monocytes Absolute: 0.5 10*3/uL (ref 0.1–1.0)
Monocytes Relative: 8 %
Neutro Abs: 2.1 10*3/uL (ref 1.7–7.7)
Neutrophils Relative %: 35 %
Platelets: 211 10*3/uL (ref 150–400)
RBC: 4.32 MIL/uL (ref 3.87–5.11)
RDW: 12.3 % (ref 11.5–15.5)
WBC: 5.9 10*3/uL (ref 4.0–10.5)
nRBC: 0 % (ref 0.0–0.2)

## 2020-04-08 LAB — COMPREHENSIVE METABOLIC PANEL WITH GFR
ALT: 19 U/L (ref 0–44)
AST: 21 U/L (ref 15–41)
Albumin: 2.7 g/dL — ABNORMAL LOW (ref 3.5–5.0)
Alkaline Phosphatase: 66 U/L (ref 38–126)
Anion gap: 7 (ref 5–15)
BUN: 16 mg/dL (ref 6–20)
CO2: 25 mmol/L (ref 22–32)
Calcium: 8.8 mg/dL — ABNORMAL LOW (ref 8.9–10.3)
Chloride: 102 mmol/L (ref 98–111)
Creatinine, Ser: 0.63 mg/dL (ref 0.44–1.00)
GFR calc Af Amer: >60 mL/min (ref >60)
GFR calc non Af Amer: >60 mL/min (ref >60)
Glucose, Bld: 225 mg/dL — ABNORMAL HIGH (ref 70–99)
Potassium: 3.6 mmol/L (ref 3.5–5.1)
Sodium: 134 mmol/L — ABNORMAL LOW (ref 135–145)
Total Bilirubin: 0.9 mg/dL (ref 0.3–1.2)
Total Protein: 8.1 g/dL (ref 6.5–8.1)

## 2020-04-08 LAB — CSF CULTURE W GRAM STAIN
Culture: NO GROWTH
Gram Stain: NONE SEEN

## 2020-04-08 LAB — GLUCOSE, CAPILLARY
Glucose-Capillary: 207 mg/dL — ABNORMAL HIGH (ref 70–99)
Glucose-Capillary: 178 mg/dL — ABNORMAL HIGH (ref 70–99)
Glucose-Capillary: 251 mg/dL — ABNORMAL HIGH (ref 70–99)
Glucose-Capillary: 254 mg/dL — ABNORMAL HIGH (ref 70–99)

## 2020-04-08 LAB — PHOSPHORUS: Phosphorus: 4.4 mg/dL (ref 2.5–4.6)

## 2020-04-08 LAB — MAGNESIUM: Magnesium: 1.9 mg/dL (ref 1.7–2.4)

## 2020-04-08 NOTE — Progress Notes (Signed)
PROGRESS NOTE    Nancy [name]  DEY:[id number] DOB: [birth year] DOA: 04/01/2020 PCP: Curlene Labrum, MD    Brief Narrative:  34 yo F with hx of obesity, tobacco abuse, T2DM, mood disorder, and multiple other medical problems who presented initially about 1 month ago with left upper extremity weakness.  She notes her L arm and hand "stopped working".  She also notes incontinence and leg weakness prior to presentation.  She was seen by neurosurgery who admitted her for an expedited workup.  TR[facility] is picking her up on 9/8.  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  Follow oligoclonal bands.  Follow [place] serologies (pending). Neurology c/s, appreciate recs - IVIG per neurology x5 doses Neurosurgery, appreciate recs - needs EMG nerveconduction studies once discharged Continues to c/o pain related to this as well, continuing home oxycodone and lyrica.  IV dilaudid for breaththrough.  Voltaren, lidocaine patch.   Hypertension: continue chlorthalidone, lisinopril - will d/c lasix in setting of thiazide  T2DM: poorly controlled, a1c 14.6.  Lantus, mealtime, SSI.  Adjust and follow.  Hold glipizide while inpatient. Diabetes education   Plan for insulin at discharge  Chronic Pain: continue home oxycodone, lyrica as tolerated.  -Today requested an increased dose of dilaudid for LE pain which causes pt to "cry in pain." On palpation of RLE, no tenderness or grimacing was illicited. When  offered to further work up LE pain which may involve fine-tuning and transitioning to more appropriate analgesia, patient vehemently declined, citing she wants to remain on dilaudid and thus refused further work up or adjustment from her current narcotic regimen  DVT prophylaxis: SCD's Code Status: Full Family Communication: Pt in room, family not at bedside  Status is: Inpatient  Remains inpatient appropriate because:Ongoing diagnostic testing needed not appropriate for outpatient work up, Unsafe d/c plan and Inpatient level of care appropriate due to severity of illness   Dispo: The patient is from: Home              Anticipated d/c is to: CIR              Anticipated d/c date is: 2 days              Patient currently is not medically stable to d/c.       Consultants:   Neurology  PCCM  Procedures:   LP 9/8  Antimicrobials: Anti-infectives (From admission, onward)   None       Subjective: Specifically requesting increasing dilaudid to "two grams or milligrams or whatever you all use."  Objective: Vitals:   04/08/20 0357 04/08/20 0811 04/08/20 1212 04/08/20 1608  BP: 106/69 134/81 103/71 115/86  Pulse: 93 87 89 99  Resp: 18 16 16 18   Temp: 98.5 F (36.9 C) 98.6 F (37 C) 98.1 F (36.7 C) 98.7 F (37.1 C)  TempSrc: Oral Oral Oral Oral  SpO2: 99% 96% 96% 94%  Weight:      Height:        Intake/Output  Summary (Last 24 hours) at 04/08/2020 1712 Last data filed at 04/08/2020 0814 Gross per 24 hour  Intake 150 ml  Output 550 ml  Net -400 ml   Filed Weights   04/05/20 1647  Weight: (!) 154.3 kg    Examination:  General exam: Appears calm and comfortable  Respiratory system: Clear to auscultation. Respiratory effort normal. Cardiovascular system: S1 & S2 heard, Regular Gastrointestinal system: Abdomen is nondistended, soft and nontender. No organomegaly or masses felt. Normal bowel sounds heard. Central nervous system: Alert and oriented. No focal  neurological deficits. Extremities: Symmetric 5 x 5 power LE nontender on palpation Skin: No rashes, lesions  Psychiatry: Judgement and insight appear normal. Mood & affect appropriate.   Data Reviewed: I have personally reviewed following labs and imaging studies  CBC: Recent Labs  Lab 04/05/20 1334 04/06/20 0444 04/07/20 0201 04/08/20 0340  WBC 6.3 5.8 5.5 5.9  NEUTROABS  --  2.3 1.9 2.1  HGB 14.0 13.4 12.9 12.0  HCT 44.6 41.8 40.8 38.1  MCV 87.6 88.0 88.5 88.2  PLT 223 210 215 242   Basic Metabolic Panel: Recent Labs  Lab 04/05/20 1334 04/06/20 0444 04/07/20 0201 04/08/20 0340  NA 133* 135 134* 134*  K 4.1 3.5 3.6 3.6  CL 100 103 102 102  CO2 24 24 25 25   GLUCOSE 257* 300* 240* 225*  BUN 8 11 14 16   CREATININE 0.54 0.51 0.60 0.63  CALCIUM 9.5 8.9 8.9 8.8*  MG  --  1.6* 1.5* 1.9  PHOS  --  4.5 5.2* 4.4   GFR: Estimated Creatinine Clearance: 157.3 mL/min (by C-G formula based on SCr of 0.63 mg/dL). Liver Function Tests: Recent Labs  Lab 04/05/20 1334 04/06/20 0444 04/07/20 0201 04/08/20 0340  AST 20 15 20 21   ALT 19 16 19 19   ALKPHOS 72 78 62 66  BILITOT 0.9 0.6 0.8 0.9  PROT 7.0 7.1 7.7 8.1  ALBUMIN 3.0* 2.6* 2.7* 2.7*   No results for input(s): LIPASE, AMYLASE in the last 168 hours. No results for input(s): AMMONIA in the last 168 hours. Coagulation Profile: No results for input(s): INR, PROTIME in the last 168 hours. Cardiac Enzymes: No results for input(s): CKTOTAL, CKMB, CKMBINDEX, TROPONINI in the last 168 hours. BNP (last 3 results) No results for input(s): PROBNP in the last 8760 hours. HbA1C: No results for input(s): HGBA1C in the last 72 hours. CBG: Recent Labs  Lab 04/07/20 1746 04/07/20 2117 04/08/20 0619 04/08/20 1219 04/08/20 1529  GLUCAP 147* 263* 207* 178* 251*   Lipid Profile: No results for input(s): CHOL, HDL, LDLCALC, TRIG, CHOLHDL, LDLDIRECT in the last 72 hours. Thyroid Function Tests: No results for input(s): TSH,  T4TOTAL, FREET4, T3FREE, THYROIDAB in the last 72 hours. Anemia Panel: No results for input(s): VITAMINB12, FOLATE, FERRITIN, TIBC, IRON, RETICCTPCT in the last 72 hours. Sepsis Labs: No results for input(s): PROCALCITON, LATICACIDVEN in the last 168 hours.  Recent Results (from the past 240 hour(s))  SARS Coronavirus 2 by RT PCR (hospital order, performed in [facility] hospital lab) Nasopharyngeal Nasopharyngeal Swab     Status: None   Collection Time: 04/01/20  4:15 PM   Specimen: Nasopharyngeal Swab  Result Value Ref Range Status   SARS Coronavirus 2 NEGATIVE NEGATIVE Final    Comment: (NOTE) SARS-CoV-2 target nucleic acids are NOT DETECTED.  The SARS-CoV-2 RNA is generally detectable in upper and lower respiratory specimens during the acute phase of infection. The lowest concentration of SARS-CoV-2 viral copies this assay  can detect is 250 copies / mL. A negative result does not preclude SARS-CoV-2 infection and should not be used as the sole basis for treatment or other patient management decisions.  A negative result may occur with improper specimen collection / handling, submission of specimen other than nasopharyngeal swab, presence of viral mutation(s) within the areas targeted by this assay, and inadequate number of viral copies (<250 copies / mL). A negative result must be combined with clinical observations, patient history, and epidemiological information.  Fact Sheet for Patients:   StrictlyIdeas.no  Fact Sheet for Healthcare Providers: BankingDealers.co.za  This test is not yet approved or  cleared by the [place] FDA and has been authorized for detection and/or diagnosis of SARS-CoV-2 by FDA under an Emergency Use Authorization (EUA).  This EUA will remain in effect (meaning this test can be used) for the duration of the COVID-19 declaration under Section 564(b)(1) of the Act, 21 U.S.C. section 360bbb-3(b)(1),  unless the authorization is terminated or revoked sooner.  Performed at [facility], [place] [address], [place], [place] [address]   CSF culture     Status: None   Collection Time: 04/05/20 12:06 PM   Specimen: A: PATH Cytology CSF; Cerebrospinal Fluid   B: PATH Cytology CSF; Cerebrospinal Fluid   C: PATH Cytology CSF; Cerebrospinal Fluid  Result Value Ref Range Status   Specimen Description CSF  Final   Special Requests NONE  Final   Gram Stain NO ORGANISMS SEEN  Final   Culture   Final    NO GROWTH 3 DAYS Performed at [facility] [address] [place], [place] [address]    Report Status 04/08/2020 FINAL  Final     Radiology Studies: No results found.  Scheduled Meds: . chlorthalidone  25 mg Oral BID  . diclofenac Sodium  2 g Topical QID  . docusate sodium  100 mg Oral BID  . insulin aspart  0-20 Units Subcutaneous TID WC  . insulin aspart  0-5 Units Subcutaneous QHS  . insulin aspart  6 Units Subcutaneous TID WC  . insulin glargine  50 Units Subcutaneous QHS  . insulin starter kit- pen needles  1 kit Other Once  . lidocaine  1 patch Transdermal Q24H  . lisinopril  2.5 mg Oral Daily  . living well with diabetes book   Does not apply Once  . nicotine  21 mg Transdermal Daily  . potassium chloride SA  20 mEq Oral Daily  . pregabalin  300 mg Oral BID  . topiramate  50 mg Oral q AM   Continuous Infusions: . Immune Globulin 10% 40 g (04/07/20 2020)     LOS: 5 days   Marylu Lund, MD [facility] Pager On Amion  If 7PM-7AM, please contact night-coverage 04/08/2020, 5:12 PM

## 2020-04-08 NOTE — Plan of Care (Signed)
  Problem: Education: Goal: Knowledge of General Education information will improve Description: Including pain rating scale, medication(s)/side effects and non-pharmacologic comfort measures Outcome: Progressing   Problem: Clinical Measurements: Goal: Will remain free from infection Outcome: Progressing Goal: Diagnostic test results will improve Outcome: Progressing   Problem: Activity: Goal: Risk for activity intolerance will decrease Outcome: Progressing   

## 2020-04-08 NOTE — Progress Notes (Signed)
Inpatient Rehab Admissions:  Inpatient Rehab Consult received.  I met with patient at the bedside for rehabilitation assessment and to discuss goals and expectations of an inpatient rehab admission.  Pt acknowledged understanding of CIR goals and expectations. Pt interested in pursuing CIR.   Pt appears to be an appropriate candidate for CIR. Pt gave permission to contact her mother, Joanne Chars. Spoke with her via phone. She also acknowledged understanding of CIR goals and expectations. She reported that she would be able to provide assistance for pt after d/c.  Will continue to monitor pt's medical workup and progress with therapies.   Signed: Gayland Curry, [place], [place] Admissions Coordinator [phone]

## 2020-04-09 DIAGNOSIS — F3130 Bipolar disorder, current episode depressed, mild or moderate severity, unspecified: Secondary | ICD-10-CM

## 2020-04-09 LAB — GLUCOSE, CAPILLARY
Glucose-Capillary: 268 mg/dL — ABNORMAL HIGH (ref 70–99)
Glucose-Capillary: 218 mg/dL — ABNORMAL HIGH (ref 70–99)
Glucose-Capillary: 200 mg/dL — ABNORMAL HIGH (ref 70–99)
Glucose-Capillary: 239 mg/dL — ABNORMAL HIGH (ref 70–99)

## 2020-04-09 NOTE — Progress Notes (Signed)
IVIG: checked with day shift RN, medication is not on the unit yet. RN to enter consult when it is on the unit.

## 2020-04-09 NOTE — Progress Notes (Addendum)
Neurology Progress Note   S:// Patient seen and examined. Reports headaches with IVIG. Finishing IVIG last dose today 04/09/2020 Does not report any significant changes on clinical improvement but says she was able to walk with physical therapy to the door.  O:// Current vital signs: BP 104/73 (BP Location: Right Arm)   Pulse 97   Temp 98.1 F (36.7 C) (Oral)   Resp 18   Ht 5' 7"  (1.702 m)   Wt (!) 154.3 kg   SpO2 100%   BMI 53.28 kg/m  Vital signs in last 24 hours: Temp:  [97.7 F (36.5 C)-98.9 F (37.2 C)] 98.1 F (36.7 C) (09/12 0809) Pulse Rate:  [89-110] 97 (09/12 0809) Resp:  [16-18] 18 (09/12 0809) BP: (91-137)/(51-86) 104/73 (09/12 0809) SpO2:  [94 %-100 %] 100 % (09/12 0809) Awake alert in no distress No dysarthria or aphasia Cranial 2-12 intact Motor exam: Left wrist drop, otherwise left and right upper extremity antigravity-at least 4+/5 with some effort dependent features on exam.  Sitting in a chair and was able to extend the knees bilaterally antigravity-with the caveat that there was a lot of pain. Sensory exam is diminished in a glove and stocking pattern DTRs: Absent in the lower extremity, trace brachioradialis on the right.  Unable to elicit others.   Medications  Current Facility-Administered Medications:  .  acetaminophen (TYLENOL) tablet 650 mg, 650 mg, Oral, Q6H PRN, 650 mg at 04/07/20 2125 **OR** acetaminophen (TYLENOL) suppository 650 mg, 650 mg, Rectal, Q6H PRN, Bergman, Meghan D, NP .  bisacodyl (DULCOLAX) suppository 10 mg, 10 mg, Rectal, Daily PRN, Bergman, Meghan D, NP .  chlorthalidone (HYGROTON) tablet 25 mg, 25 mg, Oral, BID, Bergman, Meghan D, NP, 25 mg at 04/08/20 0854 .  cyclobenzaprine (FLEXERIL) tablet 10 mg, 10 mg, Oral, TID PRN, Viona Gilmore D, NP, 10 mg at 04/09/20 9622 .  diclofenac Sodium (VOLTAREN) 1 % topical gel 2 g, 2 g, Topical, QID, Elodia Florence., MD, 2 g at 04/09/20 0905 .  docusate sodium (COLACE) capsule 100  mg, 100 mg, Oral, BID, Bergman, Meghan D, NP, 100 mg at 04/09/20 0901 .  HYDROmorphone (DILAUDID) injection 0.5-1 mg, 0.5-1 mg, Intravenous, Q2H PRN, Bergman, Meghan D, NP, 1 mg at 04/09/20 0902 .  Immune Globulin 10% (PRIVIGEN) IV infusion 40 g, 400 mg/kg (Adjusted), Intravenous, Q24 Hr x 5, Marliss Coots, PA-C, Last Rate: 46.2 mL/hr at 04/08/20 2100, 40 g at 04/08/20 2100 .  insulin aspart (novoLOG) injection 0-20 Units, 0-20 Units, Subcutaneous, TID WC, Vallarie Mare, MD, 11 Units at 04/09/20 [phone]  insulin aspart (novoLOG) injection 0-5 Units, 0-5 Units, Subcutaneous, QHS, Vallarie Mare, MD, 3 Units at 04/08/20 2234 .  insulin aspart (novoLOG) injection 6 Units, 6 Units, Subcutaneous, TID WC, Elodia Florence., MD, 6 Units at 04/09/20 0908 .  insulin glargine (LANTUS) injection 50 Units, 50 Units, Subcutaneous, QHS, Elodia Florence., MD, 50 Units at 04/08/20 2228 .  insulin starter kit- pen needles (English) 1 kit, 1 kit, Other, Once, Elodia Florence., MD .  lidocaine (LIDODERM) 5 % 1 patch, 1 patch, Transdermal, Q24H, Elodia Florence., MD, 1 patch at 04/08/20 1113 .  lisinopril (ZESTRIL) tablet 2.5 mg, 2.5 mg, Oral, Daily, Bergman, Meghan D, NP, 2.5 mg at 04/09/20 0901 .  living well with diabetes book MISC, , Does not apply, Once, Elodia Florence., MD .  LORazepam (ATIVAN) injection 1 mg, 1 mg, Intravenous, Q12H PRN,  Vallarie Mare, MD, 1 mg at 04/05/20 0950 .  nicotine (NICODERM CQ - dosed in mg/24 hours) patch 21 mg, 21 mg, Transdermal, Daily, Bergman, Meghan D, NP, 21 mg at 04/09/20 0906 .  nicotine polacrilex (NICORETTE) gum 2 mg, 2 mg, Oral, PRN, Elodia Florence., MD .  ondansetron [facility]) tablet 4 mg, 4 mg, Oral, Q6H PRN **OR** ondansetron (ZOFRAN) injection 4 mg, 4 mg, Intravenous, Q6H PRN, Bergman, Meghan D, NP .  oxyCODONE (Oxy IR/ROXICODONE) immediate release tablet 10 mg, 10 mg, Oral, Q4H PRN, Reinaldo Meeker, Meghan D, NP, 10 mg at 04/09/20  0859 .  polyethylene glycol (MIRALAX / GLYCOLAX) packet 17 g, 17 g, Oral, Daily PRN, Bergman, Meghan D, NP .  potassium chloride SA (KLOR-CON) CR tablet 20 mEq, 20 mEq, Oral, Daily, Bergman, Meghan D, NP, 20 mEq at 04/08/20 0855 .  pregabalin (LYRICA) capsule 300 mg, 300 mg, Oral, BID, Bergman, Meghan D, NP, 300 mg at 04/09/20 0900 .  topiramate (TOPAMAX) tablet 50 mg, 50 mg, Oral, q AM, Bergman, Meghan D, NP, 50 mg at 04/09/20 1771 .  white petrolatum (VASELINE) gel, , Topical, PRN, Elodia Florence., MD Labs CBC    Component Value Date/Time   WBC 5.9 04/08/2020 0340   RBC 4.32 04/08/2020 0340   HGB 12.0 04/08/2020 0340   HCT 38.1 04/08/2020 0340   PLT 211 04/08/2020 0340   MCV 88.2 04/08/2020 0340   MCH 27.8 04/08/2020 0340   MCHC 31.5 04/08/2020 0340   RDW 12.3 04/08/2020 0340   LYMPHSABS 3.2 04/08/2020 0340   MONOABS 0.5 04/08/2020 0340   EOSABS 0.1 04/08/2020 0340   BASOSABS 0.0 04/08/2020 0340    CMP     Component Value Date/Time   NA 134 (L) 04/08/2020 0340   K 3.6 04/08/2020 0340   CL 102 04/08/2020 0340   CO2 25 04/08/2020 0340   GLUCOSE 225 (H) 04/08/2020 0340   BUN 16 04/08/2020 0340   CREATININE 0.63 04/08/2020 0340   CREATININE 0.63 11/04/2011 1545   CALCIUM 8.8 (L) 04/08/2020 0340   PROT 8.1 04/08/2020 0340   ALBUMIN 2.7 (L) 04/08/2020 0340   AST 21 04/08/2020 0340   ALT 19 04/08/2020 0340   ALKPHOS 66 04/08/2020 0340   BILITOT 0.9 04/08/2020 0340   GFRNONAA >60 04/08/2020 0340   GFRAA >60 04/08/2020 0340    glycosylated hemoglobin  Lipid Panel     Component Value Date/Time   CHOL 160 08/29/2016 0641   TRIG 187 (H) 08/29/2016 0641   HDL 34 (L) 08/29/2016 0641   CHOLHDL 4.7 08/29/2016 0641   VLDL 37 08/29/2016 0641   LDLCALC 89 08/29/2016 0641     Imaging I have reviewed images in epic and the results pertinent to this consultation are: No new imaging to review  Assessment: 34 year old presented with weakness of upper and lower  extremities, bilateral lower extremity pain and weakness along with urinary incontinence. Has symmetric bilateral lower extremity weakness.  Brain MRI, C-spine T-spine and L-spine MRI did not show any source of weakness.  Examination was revealed by some effort dependence. Spinal tap done which showed a significant abnormal cytologic dissociation-with the caveat that she is a bad diabetic. In either case, treated her like AIDP-finishing fifth day of IVIG today. Could be CIDP as well -unclear history in terms of timeline patient is not a great historian that way.  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  Recommendations: Finish 5 doses of IVIG-last dose today. Pain management per  primary team Continue PT OT Rehabilitation per primary team and therapy recommendations Outpatient follow-up with outpatient neurology in 4 to 6 weeks for possible nerve conduction EMG studies. We will recommend sending to Dr. Posey Pronto at [facility]--neuromuscular specialist. Inpatient neurology will be available as needed.  Please call with questions. -- Amie [place], MD [facility] Pager: [phone] If 7pm to 7am, please call on call as listed on AMION.

## 2020-04-09 NOTE — Progress Notes (Signed)
PROGRESS NOTE    Nancy [name]  ZOX:[id number] DOB: 10-May-1986 DOA: 04/01/2020 PCP: Curlene Labrum, MD    Brief Narrative:  34 yo F with hx of obesity, tobacco abuse, T2DM, mood disorder, and multiple other medical problems who presented initially about 1 month ago with left upper extremity weakness.  She notes her L arm and hand "stopped working".  She also notes incontinence and leg weakness prior to presentation.  She was seen by neurosurgery who admitted her for an expedited workup.  TR[facility] is picking her up on 9/8.  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  Follow oligoclonal bands.  Follow [place] serologies (pending). Neurology c/s, appreciate recs -to complete 5th round of IVIG later tonight Neurosurgery, appreciate recs - needs EMG nerveconduction studies once discharged Continues to c/o pain related to this as well, continuing home oxycodone and lyrica.  IV dilaudid for breaththrough.  Voltaren, lidocaine patch.   Hypertension: continue chlorthalidone, lisinopril - had d/c'd lasix in setting of thiazide  T2DM: poorly controlled, a1c 14.6.  Lantus, mealtime, SSI.  Adjust and follow.  Hold glipizide while inpatient. Diabetes education   Plan for insulin at discharge  Chronic Pain: continue home oxycodone, lyrica as tolerated.   DVT prophylaxis: SCD's Code Status: Full Family Communication: Pt in room, family not at bedside  Status is: Inpatient  Remains inpatient  appropriate because:Ongoing diagnostic testing needed not appropriate for outpatient work up, Unsafe d/c plan and Inpatient level of care appropriate due to severity of illness   Dispo: The patient is from: Home              Anticipated d/c is to: CIR              Anticipated d/c date is: 2 days              Patient currently is not medically stable to d/c.    Consultants:   Neurology  PCCM  Procedures:   LP 9/8  Antimicrobials: Anti-infectives (From admission, onward)   None      Subjective: About to have lunch when seen, without complaints at this time. In good spirits regarding receiving her final dose of IVIG tonight  Objective: Vitals:   04/09/20 0353 04/09/20 0809 04/09/20 1158 04/09/20 1616  BP: (!) 91/51 104/73 101/66 (!) 96/54  Pulse: 97 97 91 95  Resp: _0 Temp: 98.2 F (36.8 C) 98.1 F (36.7 C) 98.5 F (36.9 C) 98.2 F (36.8 C)  TempSrc: Oral Oral Oral Oral  SpO2: 99% 100% 97%   Weight:      Height:        Intake/Output Summary (Last 24 hours) at 04/09/2020 1651 Last data filed at 04/09/2020 0800 Gross per 24 hour  Intake 150 ml  Output 950 ml  Net -800 ml   Filed Weights   04/05/20 1647  Weight: (!) 154.3 kg    Examination: General exam: Awake, laying in bed, in nad Respiratory system: Normal respiratory effort, no  wheezing Cardiovascular system: regular rate, s1, s2 Gastrointestinal system: Soft, nondistended, positive BS Central nervous system: CN2-12 grossly intact, strength intact Extremities: Perfused, no clubbing Skin: Normal skin turgor, no notable skin lesions seen Psychiatry: Mood normal // no visual hallucinations   Data Reviewed: I have personally reviewed following labs and imaging studies  CBC: Recent Labs  Lab 04/05/20 1334 04/06/20 0444 04/07/20 0201 04/08/20 0340  WBC 6.3 5.8 5.5 5.9  NEUTROABS  --  2.3 1.9 2.1  HGB 14.0 13.4 12.9 12.0  HCT 44.6 41.8 40.8 38.1  MCV 87.6 88.0 88.5 88.2  PLT 223 210 215  956   Basic Metabolic Panel: Recent Labs  Lab 04/05/20 1334 04/06/20 0444 04/07/20 0201 04/08/20 0340  NA 133* 135 134* 134*  K 4.1 3.5 3.6 3.6  CL 100 103 102 102  CO2 _0 GLUCOSE 257* 300* 240* 225*  BUN _1 CREATININE 0.54 0.51 0.60 0.63  CALCIUM 9.5 8.9 8.9 8.8*  MG  --  1.6* 1.5* 1.9  PHOS  --  4.5 5.2* 4.4   GFR: Estimated Creatinine Clearance: 157.3 mL/min (by C-G formula based on SCr of 0.63 mg/dL). Liver Function Tests: Recent Labs  Lab 04/05/20 1334 04/06/20 0444 04/07/20 0201 04/08/20 0340  AST _2 ALT _3 ALKPHOS 72 78 62 66  BILITOT 0.9 0.6 0.8 0.9  PROT 7.0 7.1 7.7 8.1  ALBUMIN 3.0* 2.6* 2.7* 2.7*   No results for input(s): LIPASE, AMYLASE in the last 168 hours. No results for input(s): AMMONIA in the last 168 hours. Coagulation Profile: No results for input(s): INR, PROTIME in the last 168 hours. Cardiac Enzymes: No results for input(s): CKTOTAL, CKMB, CKMBINDEX, TROPONINI in the last 168 hours. BNP (last 3 results) No results for input(s): PROBNP in the last 8760 hours. HbA1C: No results for input(s): HGBA1C in the last 72 hours. CBG: Recent Labs  Lab 04/08/20 1529 04/08/20 2117 04/09/20 0625 04/09/20 1124 04/09/20 1615  GLUCAP 251* 254* 268* 218* 200*   Lipid Profile: No results for input(s): CHOL, HDL, LDLCALC, TRIG, CHOLHDL, LDLDIRECT in the last 72 hours. Thyroid Function Tests: No results for input(s): TSH, T4TOTAL, FREET4, T3FREE, THYROIDAB in the last 72 hours. Anemia Panel: No results for input(s): VITAMINB12, FOLATE, FERRITIN, TIBC, IRON, RETICCTPCT in the last 72 hours. Sepsis Labs: No results for input(s): PROCALCITON, LATICACIDVEN in the last 168 hours.  Recent Results (from the past 240 hour(s))  SARS Coronavirus 2 by RT PCR (hospital order, performed in [facility] hospital lab) Nasopharyngeal Nasopharyngeal Swab     Status: None   Collection Time: 04/01/20  4:15 PM   Specimen:  Nasopharyngeal Swab  Result Value Ref Range Status   SARS Coronavirus 2 NEGATIVE NEGATIVE Final    Comment: (NOTE) SARS-CoV-2 target nucleic acids are NOT DETECTED.  The SARS-CoV-2 RNA is generally detectable in upper and lower respiratory specimens during the acute phase of infection. The lowest concentration of SARS-CoV-2 viral copies this assay can detect is 250 copies / mL. A negative result does not preclude SARS-CoV-2 infection and should not be used as the sole basis for treatment or other patient management decisions.  A negative result may occur with improper specimen collection / handling, submission of specimen other than nasopharyngeal swab, presence of viral mutation(s) within the areas targeted by this assay, and inadequate number of viral copies (<250 copies / mL). A negative result must be combined with clinical observations,  patient history, and epidemiological information.  Fact Sheet for Patients:   StrictlyIdeas.no  Fact Sheet for Healthcare Providers: BankingDealers.co.za  This test is not yet approved or  cleared by the [place] FDA and has been authorized for detection and/or diagnosis of SARS-CoV-2 by FDA under an Emergency Use Authorization (EUA).  This EUA will remain in effect (meaning this test can be used) for the duration of the COVID-19 declaration under Section 564(b)(1) of the Act, 21 U.S.C. section 360bbb-3(b)(1), unless the authorization is terminated or revoked sooner.  Performed at [facility], [place] [address] [place], [place] [address]   CSF culture     Status: None   Collection Time: 04/05/20 12:06 PM   Specimen: A: PATH Cytology CSF; Cerebrospinal Fluid   B: PATH Cytology CSF; Cerebrospinal Fluid   C: PATH Cytology CSF; Cerebrospinal Fluid  Result Value Ref Range Status   Specimen Description CSF  Final   Special Requests NONE  Final   Gram Stain NO ORGANISMS SEEN  Final    Culture   Final    NO GROWTH 3 DAYS Performed at [facility] [address] [place], [place] [address]    Report Status 04/08/2020 FINAL  Final     Radiology Studies: No results found.  Scheduled Meds: . chlorthalidone  25 mg Oral BID  . diclofenac Sodium  2 g Topical QID  . docusate sodium  100 mg Oral BID  . insulin aspart  0-20 Units Subcutaneous TID WC  . insulin aspart  0-5 Units Subcutaneous QHS  . insulin aspart  6 Units Subcutaneous TID WC  . insulin glargine  50 Units Subcutaneous QHS  . insulin starter kit- pen needles  1 kit Other Once  . lidocaine  1 patch Transdermal Q24H  . lisinopril  2.5 mg Oral Daily  . living well with diabetes book   Does not apply Once  . nicotine  21 mg Transdermal Daily  . potassium chloride SA  20 mEq Oral Daily  . pregabalin  300 mg Oral BID  . topiramate  50 mg Oral q AM   Continuous Infusions: . Immune Globulin 10% 40 g (04/08/20 2100)     LOS: 6 days   Marylu Lund, MD [facility] Pager On Amion  If 7PM-7AM, please contact night-coverage 04/09/2020, 4:51 PM

## 2020-04-10 DIAGNOSIS — E871 Hypo-osmolality and hyponatremia: Secondary | ICD-10-CM

## 2020-04-10 LAB — GLUCOSE, CAPILLARY
Glucose-Capillary: 195 mg/dL — ABNORMAL HIGH (ref 70–99)
Glucose-Capillary: 241 mg/dL — ABNORMAL HIGH (ref 70–99)
Glucose-Capillary: 229 mg/dL — ABNORMAL HIGH (ref 70–99)
Glucose-Capillary: 189 mg/dL — ABNORMAL HIGH (ref 70–99)

## 2020-04-10 LAB — OLIGOCLONAL BANDS, CSF + SERM

## 2020-04-10 MED ORDER — INSULIN ASPART 100 UNIT/ML ~~LOC~~ SOLN
10.0000 [IU] | Freq: Three times a day (TID) | SUBCUTANEOUS | Status: DC
  Administered 2020-04-10 – 2020-04-11 (×3): 10 [IU] via SUBCUTANEOUS

## 2020-04-10 MED ORDER — INSULIN GLARGINE 100 UNIT/ML ~~LOC~~ SOLN
55.0000 [IU] | Freq: Every day | SUBCUTANEOUS | Status: DC
  Administered 2020-04-10: 55 [IU] via SUBCUTANEOUS
  Filled 2020-04-10 (×2): qty 0.55

## 2020-04-10 NOTE — Progress Notes (Signed)
PROGRESS NOTE    Nancy [name]  CZY:[id number] DOB: [birth year] DOA: 04/01/2020 PCP: Curlene Labrum, MD    Brief Narrative:  34 yo F with hx of obesity, tobacco abuse, T2DM, mood disorder, and multiple other medical problems who presented initially about 1 month ago with left upper extremity weakness.  She notes her L arm and hand "stopped working".  She also notes incontinence and leg weakness prior to presentation.  She was seen by neurosurgery who admitted her for an expedited workup.  TRH is picking her up on 9/8.  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  Follow oligoclonal bands.  Follow [place] serologies (pending). Neurology c/s, appreciate recs. Pt now completed 5 day course of IVIG Neurosurgery, appreciate recs - needs EMG nerveconduction studies once discharged Continues to c/o pain related to this as well, continuing home oxycodone and lyrica.  IV dilaudid for breaththrough.  Voltaren, lidocaine patch.  Pending d/c to CIR  Hypertension: continue chlorthalidone, lisinopril - had d/c'd lasix in setting of thiazide  T2DM: poorly controlled, a1c 14.6.  Lantus, mealtime, SSI.  Adjust and follow.  Hold glipizide while inpatient. Diabetes education   Plan for insulin at discharge Will increase lantus to 55 units and meal coverage to 10 units per diabetic coordinator recs  Chronic Pain: continue home oxycodone, lyrica as tolerated.   DVT prophylaxis: SCD's Code Status:  Full Family Communication: Pt in room, family not at bedside  Status is: Inpatient  Remains inpatient appropriate because:Ongoing diagnostic testing needed not appropriate for outpatient work up, Unsafe d/c plan and Inpatient level of care appropriate due to severity of illness   Dispo: The patient is from: Home              Anticipated d/c is to: CIR              Anticipated d/c date is: 2 days              Patient currently is not medically stable to d/c.    Consultants:   Neurology  PCCM  Procedures:   LP 9/8  Antimicrobials: Anti-infectives (From admission, onward)   None      Subjective: Tolerating diet. Denies pain at this time  Objective: Vitals:   04/10/20 0744 04/10/20 0752 04/10/20 1136 04/10/20 1611  BP:  (!) 98/53 104/70 134/78  Pulse: 91 91 95 96  Resp: 20 17 16 18   Temp: 98.4 F (36.9 C) 98.4 F (36.9 C) 98.5 F (36.9 C) 99.2 F (37.3 C)  TempSrc: Oral Oral Oral Oral  SpO2: 100% 100% 96% 97%  Weight:      Height:       No intake or output data in the 24 hours ending 04/10/20 1654 Filed Weights   04/05/20 1647  Weight: (!) 154.3 kg    Examination: General exam: Conversant, in no acute distress Respiratory system: normal chest rise, clear, no audible wheezing Cardiovascular system: regular rhythm, s1-s2 Gastrointestinal system: Nondistended, nontender, pos BS Central nervous system: No  seizures, no tremors Extremities: No cyanosis, no joint deformities Skin: No rashes, no pallor Psychiatry: Affect normal // no auditory hallucinations   Data Reviewed: I have personally reviewed following labs and imaging studies  CBC: Recent Labs  Lab 04/05/20 1334 04/06/20 0444 04/07/20 0201 04/08/20 0340  WBC 6.3 5.8 5.5 5.9  NEUTROABS  --  2.3 1.9 2.1  HGB 14.0 13.4 12.9 12.0  HCT 44.6 41.8 40.8 38.1  MCV 87.6 88.0 88.5 88.2  PLT 223 210 215 409   Basic Metabolic Panel: Recent Labs  Lab 04/05/20 1334 04/06/20 0444 04/07/20 0201  04/08/20 0340  NA 133* 135 134* 134*  K 4.1 3.5 3.6 3.6  CL 100 103 102 102  CO2 24 24 25 25   GLUCOSE 257* 300* 240* 225*  BUN 8 11 14 16   CREATININE 0.54 0.51 0.60 0.63  CALCIUM 9.5 8.9 8.9 8.8*  MG  --  1.6* 1.5* 1.9  PHOS  --  4.5 5.2* 4.4   GFR: Estimated Creatinine Clearance: 157.3 mL/min (by C-G formula based on SCr of 0.63 mg/dL). Liver Function Tests: Recent Labs  Lab 04/05/20 1334 04/06/20 0444 04/07/20 0201 04/08/20 0340  AST 20 15 20 21   ALT 19 16 19 19   ALKPHOS 72 78 62 66  BILITOT 0.9 0.6 0.8 0.9  PROT 7.0 7.1 7.7 8.1  ALBUMIN 3.0* 2.6* 2.7* 2.7*   No results for input(s): LIPASE, AMYLASE in the last 168 hours. No results for input(s): AMMONIA in the last 168 hours. Coagulation Profile: No results for input(s): INR, PROTIME in the last 168 hours. Cardiac Enzymes: No results for input(s): CKTOTAL, CKMB, CKMBINDEX, TROPONINI in the last 168 hours. BNP (last 3 results) No results for input(s): PROBNP in the last 8760 hours. HbA1C: No results for input(s): HGBA1C in the last 72 hours. CBG: Recent Labs  Lab 04/09/20 1124 04/09/20 1615 04/09/20 2108 04/10/20 0614 04/10/20 1139  GLUCAP 218* 200* 239* 195* 241*   Lipid Profile: No results for input(s): CHOL, HDL, LDLCALC, TRIG, CHOLHDL, LDLDIRECT in the last 72 hours. Thyroid Function Tests: No results for input(s): TSH, T4TOTAL, FREET4, T3FREE, THYROIDAB in the last 72 hours. Anemia Panel: No results for input(s): VITAMINB12, FOLATE, FERRITIN, TIBC, IRON, RETICCTPCT in the last 72 hours. Sepsis Labs: No results for input(s): PROCALCITON, LATICACIDVEN in the last 168 hours.  Recent Results (from the past 240 hour(s))  SARS Coronavirus 2 by RT PCR (hospital order, performed in [facility] hospital lab) Nasopharyngeal Nasopharyngeal Swab     Status: None   Collection Time: 04/01/20  4:15 PM   Specimen: Nasopharyngeal Swab  Result Value Ref Range Status   SARS Coronavirus 2 NEGATIVE NEGATIVE Final     Comment: (NOTE) SARS-CoV-2 target nucleic acids are NOT DETECTED.  The SARS-CoV-2 RNA is generally detectable in upper and lower respiratory specimens during the acute phase of infection. The lowest concentration of SARS-CoV-2 viral copies this assay can detect is 250 copies / mL. A negative result does not preclude SARS-CoV-2 infection and should not be used as the sole basis for treatment or other patient management decisions.  A negative result may occur with improper specimen collection / handling, submission of specimen other than nasopharyngeal swab, presence of viral mutation(s) within the areas targeted by this assay, and inadequate number of viral copies (<250 copies / mL). A negative result must be combined with clinical observations, patient history, and epidemiological information.  Fact Sheet for Patients:   StrictlyIdeas.no  Fact Sheet for Healthcare Providers: BankingDealers.co.za  This test is not yet approved or  cleared by the [place] and has been authorized for detection and/or diagnosis of SARS-CoV-2 by FDA under an Emergency Use Authorization (EUA).  This EUA will remain in effect (meaning this test can be used) for the duration of the COVID-19 declaration under Section 564(b)(1) of the Act, 21 U.S.C. section 360bbb-3(b)(1), unless the authorization is terminated or revoked sooner.  Performed at [facility], [place] [address] [place], [place] [address]   CSF culture     Status: None   Collection Time: 04/05/20 12:06 PM   Specimen: A: PATH Cytology CSF; Cerebrospinal Fluid   B: PATH Cytology CSF; Cerebrospinal Fluid   C: PATH Cytology CSF; Cerebrospinal Fluid  Result Value Ref Range Status   Specimen Description CSF  Final   Special Requests NONE  Final   Gram Stain NO ORGANISMS SEEN  Final   Culture   Final    NO GROWTH 3 DAYS Performed at [facility] [address], [place],  [place] [address]    Report Status 04/08/2020 FINAL  Final     Radiology Studies: No results found.  Scheduled Meds: . chlorthalidone  25 mg Oral BID  . diclofenac Sodium  2 g Topical QID  . docusate sodium  100 mg Oral BID  . insulin aspart  0-20 Units Subcutaneous TID WC  . insulin aspart  0-5 Units Subcutaneous QHS  . insulin aspart  10 Units Subcutaneous TID WC  . insulin glargine  55 Units Subcutaneous QHS  . insulin starter kit- pen needles  1 kit Other Once  . lidocaine  1 patch Transdermal Q24H  . lisinopril  2.5 mg Oral Daily  . living well with diabetes book   Does not apply Once  . nicotine  21 mg Transdermal Daily  . potassium chloride SA  20 mEq Oral Daily  . pregabalin  300 mg Oral BID  . topiramate  50 mg Oral q AM   Continuous Infusions:    LOS: 7 days   Marylu Lund, MD [facility] Pager On Amion  If 7PM-7AM, please contact night-coverage 04/10/2020, 4:54 PM

## 2020-04-10 NOTE — Progress Notes (Signed)
Inpatient Diabetes Program Recommendations  AACE/ADA: New Consensus Statement on Inpatient Glycemic Control (2015)  Target Ranges:  Prepandial:   less than 140 mg/dL      Peak postprandial:   less than 180 mg/dL (1-2 hours)      Critically ill patients:  140 - 180 mg/dL   Results for ANGELLA, MONTAS (MRN [id number]) as of 04/10/2020 11:42  Ref. Range 04/09/2020 06:25 04/09/2020 11:24 04/09/2020 16:15 04/09/2020 21:08  Glucose-Capillary Latest Ref Range: 70 - 99 mg/dL 431 (H)  17 units NOVOLOG  218 (H)  13 units NOVOLOG  200 (H)  10 units NOVOLOG  239 (H)  2 units NOVOLOG +  50 units LANTUS @11pm    Results for LUCCA, GREGGS (MRN [name]) as of 04/10/2020 11:42  Ref. Range 04/10/2020 06:14 04/10/2020 11:39  Glucose-Capillary Latest Ref Range: 70 - 99 mg/dL 04/12/2020 (H)  10 units NOVOLOG  241 (H)   Home DM Meds: Lantus 40 units daily (has not taken in over a year)      Glipizide 5 mg bid (takes occasionally)   Current Orders: Lantus 50 units QHS           Novolog 0-20 units ac/hs      Novolog 6 units TID with meals   Per notes from Diabetes Coordinator on 09/09, pt admits to not taking Lantus for over a year. Willing to restart insulin at home and would like a Freestyle Libre CGM when she goes home   MD- Please consider the following in-hospital insulin adjustments:  1. Increase Lantus to 55 units QHS  2. Increase Novolog Meal Coverage to 10 units TID with meals     --Will follow patient during hospitalization--  11/09 RN, MSN, CDE Diabetes Coordinator Inpatient Glycemic Control Team Team Pager: [phone] (8a-5p)

## 2020-04-10 NOTE — Progress Notes (Signed)
Physical Therapy Treatment Patient Details Name: Nancy [name] MRN: [id number] DOB: 1986-03-19 Today's Date: 04/10/2020    History of Present Illness This 34 y.o. female admitted for evaluation of neck pain, and Lt UE weakness, and bil. LE weakness as well as accompanying neck pain and urinary incontinence. MRI of brain, cervical, thoracic, and lumbar spine negative for acute lesions. 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 Pt with increased LE pain with standing activities with RN bringing pain medicine at end of session. Pt is progressing. Acute PT to continue during pt's hospital stay.    Follow Up Recommendations  CIR     Equipment Recommendations  Wheelchair (measurements PT);Wheelchair cushion (measurements PT);Rolling walker with 5" wheels    Recommendations for Other Services Rehab consult     Precautions / Restrictions Precautions Precautions: Fall Precaution Comments: reports falls "a lot" at home; describes knees buckling Restrictions Weight Bearing Restrictions: No    Mobility  Bed Mobility               General bed mobility comments: OOB in chair before and after session  Transfers Overall transfer level: Needs assistance Equipment used: Rolling walker (2 wheeled) Transfers: Sit to/from Stand Sit to Stand: Mod assist;+2 physical assistance;+2 safety/equipment         General transfer comment: pt able to stand to RW with 2 person assit. standing at RW: worked on lateral weight shifting with guaring of knees just in case of buckling (no buckling  occured). then worked on stepping in place for 3-4 steps each LE. Increased time and effort needed to lift LE with each rep.      Cognition   Behavior During Therapy: WFL for tasks assessed/performed Overall Cognitive Status: Within Functional Limits for tasks assessed                  Exercises General Exercises - Lower Extremity Long Arc Quad: AAROM;Strengthening;Both;5 reps;Seated;Limitations Long [employer] Limitations: min to mod AA needed bil LE's. Toe Raises: AROM;Strengthening;Both;5 reps;Seated;Limitations Toe Raises Limitations: with feet on floor, limited range noted Heel Raises: AROM;Strengthening;Both;Seated;5 reps;Limitations Heel Raises Limitations: with feet on floor, limited range noted     Pertinent Vitals/Pain Pain Assessment: 0-10 Pain Score: 8  (9/10 after session) Pain Location: bil LEs Pain Descriptors / Indicators: Discomfort;Sore Pain Intervention(s): Limited activity within patient's tolerance;Monitored during session;Patient requesting pain meds-RN notified;RN gave pain meds during session     PT Goals (current goals can now be found in the care plan section) Acute Rehab PT Goals Patient Stated Goal: to get my hand working PT Goal Formulation: With patient Time For Goal Achievement: 04/16/20 Potential to Achieve Goals: Good Additional Goals Additional Goal #1: Patient will demonstrate knowledge of wheelchair parts (may need assist to use). Patient will self-propel x 100 ft with supervision, including simulated home environment Progress towards PT goals: Progressing toward goals    Frequency    Min 3X/week      PT Plan Current plan remains appropriate    AM-PAC PT "6 Clicks" Mobility   Outcome Measure  Help needed turning from your back to your side while in a flat bed without  using bedrails?: A Little Help needed moving from lying on your back to sitting on the side of a flat bed without using bedrails?: A Little Help needed moving  to and from a bed to a chair (including a wheelchair)?: A Lot Help needed standing up from a chair using your arms (e.g., wheelchair or bedside chair)?: A Lot Help needed to walk in hospital room?: A Lot Help needed climbing 3-5 steps with a railing? : Total 6 Click Score: 13    End of Session Equipment Utilized During Treatment: Gait belt Activity Tolerance: Patient tolerated treatment well;Patient limited by fatigue;Patient limited by pain Patient left: in chair;with call bell/phone within reach;with nursing/sitter in room;with chair alarm set Nurse Communication: Mobility status PT Visit Diagnosis: Other abnormalities of gait and mobility (R26.89);Repeated falls (R29.6);Muscle weakness (generalized) (M62.81);Other symptoms and signs involving the nervous system (R29.898)     Time: 9417-4081 PT Time Calculation (min) (ACUTE ONLY): 17 min  Charges:  $Therapeutic Activity: 8-22 mins                    Sallyanne Kuster, PTA, [facility] Acute [employer] Office- [phone] 04/10/20, 11:46 AM  Sallyanne Kuster 04/10/2020, 11:44 AM

## 2020-04-11 ENCOUNTER — Encounter (HOSPITAL_COMMUNITY): Payer: Self-pay | Admitting: Neurosurgery

## 2020-04-11 LAB — GLUCOSE, CAPILLARY
Glucose-Capillary: 187 mg/dL — ABNORMAL HIGH (ref 70–99)
Glucose-Capillary: 228 mg/dL — ABNORMAL HIGH (ref 70–99)
Glucose-Capillary: 166 mg/dL — ABNORMAL HIGH (ref 70–99)
Glucose-Capillary: 187 mg/dL — ABNORMAL HIGH (ref 70–99)

## 2020-04-11 MED ORDER — INSULIN GLARGINE 100 UNIT/ML ~~LOC~~ SOLN
57.0000 [IU] | Freq: Every day | SUBCUTANEOUS | Status: DC
  Administered 2020-04-11: 57 [IU] via SUBCUTANEOUS
  Filled 2020-04-11 (×3): qty 0.57

## 2020-04-11 MED ORDER — INSULIN ASPART 100 UNIT/ML ~~LOC~~ SOLN
12.0000 [IU] | Freq: Three times a day (TID) | SUBCUTANEOUS | Status: DC
  Administered 2020-04-11 – 2020-04-12 (×2): 12 [IU] via SUBCUTANEOUS

## 2020-04-11 NOTE — TOC Initial Note (Signed)
Transition of Care [facility]) - Initial/Assessment Note    Patient Details  Name: Nancy [name] MRN: [id number] Date of Birth: March 25, 1986  [facility]) CM/SW Contact:    Pollie Friar, RN Phone Number: 04/11/2020, 11:02 AM  Clinical Narrative:                 Recommendations are for IP. [facility] has no availability over next several days. CM met with the patient and she is agreeable to IR closer to [place] where she lives. CM inquired about [facility] and she was in agreement. CM has spoken to [place] with [facility] and faxed him the information needed.  TOC following.  Expected Discharge Plan: IP Rehab Facility Barriers to Discharge: No Barriers Identified   Patient Goals and CMS Choice   CMS Medicare.gov Compare Post Acute Care list provided to:: Patient Choice offered to / list presented to : Patient  Expected Discharge Plan and Services Expected Discharge Plan: [place]   Discharge Planning Services: CM Consult Post Acute Care Choice: IP Rehab Living arrangements for the past 2 months: Single Family Home                                      Prior Living Arrangements/Services Living arrangements for the past 2 months: Single Family Home   Patient language and need for interpreter reviewed:: Yes Do you feel safe going back to the place where you live?: Yes      Need for Family Participation in Patient Care: Yes (Comment) Care giver support system in place?: Yes (comment) (mother)   Criminal Activity/Legal Involvement Pertinent to Current Situation/Hospitalization: No - Comment as needed  Activities of Daily Living Home Assistive Devices/Equipment: None ADL Screening (condition at time of admission) Patient's cognitive ability adequate to safely complete daily activities?: Yes Is the patient deaf or have difficulty hearing?: No Does the patient have difficulty seeing, even when wearing glasses/contacts?: No Does the patient have difficulty  concentrating, remembering, or making decisions?: No Patient able to express need for assistance with ADLs?: Yes Does the patient have difficulty dressing or bathing?: No Independently performs ADLs?: Yes (appropriate for developmental age) Does the patient have difficulty walking or climbing stairs?: No Weakness of Legs: Left Weakness of Arms/Hands: Left  Permission Sought/Granted                  Emotional Assessment Appearance:: Appears stated age Attitude/Demeanor/Rapport: Engaged Affect (typically observed): Accepting Orientation: : Oriented to Self, Oriented to Place, Oriented to  Time, Oriented to Situation   Psych Involvement: No (comment)  Admission diagnosis:  Weakness [R53.1] Patient Active Problem List   Diagnosis Date Noted  . CHF (congestive heart failure) ([place])   . Diabetes mellitus without complication ([place])   . HTN (hypertension)   . Hyponatremia   . Weakness 04/01/2020  . Bipolar affective disorder, depressed, severe ([place]) 08/27/2016  . Opiate abuse, episodic ([place]) 04/09/2015  . MDD (major depressive disorder), recurrent episode, severe ([place]) 04/09/2015  . Suicide attempt by other tranquilizer drug overdose ([place]) 04/09/2015  . 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 Dysmenorrhea 07/06/2013  . Dysmenorrhea 06/09/2013  . Menorrhagia with regular cycle 06/09/2013  . Leg edema 10/03/2011  . Edema leg 10/03/2011  . Bipolar I disorder, most recent episode depressed ([place]) 09/26/2011  . Morbid obesity ([place]) 09/26/2011  . Weakness of both  legs 09/04/2011  . Difficulty in walking(719.7) 09/04/2011  . Paraparesis (HCC) 09/04/2011   PCP:  Burdine, Steven E, MD Pharmacy:   [id number] [place], [place] - [address] AT [facility] [address] [place] [address] Phone: [phone] Fax: [phone]     Social Determinants of Health (SDOH) Interventions    Readmission Risk  Interventions No flowsheet data found.  

## 2020-04-11 NOTE — Progress Notes (Signed)
[facility] IP rehab admissions - I spoke with [facility], CM, who is now pursuing CIR in [place] due to lack of bed availability here at [facility].  I will follow at a distance.  [phone]

## 2020-04-11 NOTE — Progress Notes (Addendum)
Inpatient Diabetes Program Recommendations  AACE/ADA: New Consensus Statement on Inpatient Glycemic Control (2015)  Target Ranges:  Prepandial:   less than 140 mg/dL      Peak postprandial:   less than 180 mg/dL (1-2 hours)      Critically ill patients:  140 - 180 mg/dL   Results for PRESLY, STEINRUCK (MRN [id number]) as of 04/11/2020 13:33  Ref. Range 04/11/2020 06:34 04/11/2020 11:43  Glucose-Capillary Latest Ref Range: 70 - 99 mg/dL 187 (H)  14 units NOVOLOG  228 (H)  17 units NOVOLOG     Home DM Meds: Lantus 40 units daily (has not taken in over a year)                            Glipizide 5 mg bid (takes occasionally)   Current Orders: Lantus 55 units QHS                                                       Novolog 0-20 units ac/hs                            Novolog 10 units TID with meals   Per notes from Diabetes Coordinator on 09/09, pt admits to not taking Lantus for over a year. Willing to restart insulin at home and would like a Freestyle Libre CGM when she goes home    MD- CBGs remain elevated today despite increases of Lantus and Novolog meal coverage yesterday.  Please consider:  1. Increase Lantus slightly to 57 units QHS  2. Increase Novolog Meal Coverage to 12 units TID with meals    Met w/ pt today at bedside.  Explained to pt that we have her on both Lantus and Novolog.  Explained how we are using the Novolog and how the Novolog works.  Patient requests Freestyle CGM at time of d/c.  Will wait until pt ready to d/c to Rehab to ask MD to order and to place on pt.   --Will follow patient during hospitalization--  Wyn Quaker RN, MSN, CDE Diabetes Coordinator Inpatient Glycemic Control Team Team Pager: [phone] (8a-5p)

## 2020-04-11 NOTE — Progress Notes (Signed)
PROGRESS NOTE    Nancy [name]  GMW:[id number] DOB: 1985/10/21 DOA: 04/01/2020 PCP: Curlene Labrum, MD    Brief Narrative:  34 yo F with hx of obesity, tobacco abuse, T2DM, mood disorder, and multiple other medical problems who presented initially about 1 month ago with left upper extremity weakness.  She notes her L arm and hand "stopped working".  She also notes incontinence and leg weakness prior to presentation.  She was seen by neurosurgery who admitted her for an expedited workup.  [facility] is picking her up on 9/8.  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  Follow oligoclonal bands.  Follow [place] serologies (pending). Neurology c/s, appreciate recs. Pt now completed 5 day course of IVIG Neurosurgery, appreciate recs - needs EMG nerveconduction studies once discharged Continues to c/o pain related to this as well, continuing home oxycodone and lyrica.  IV dilaudid for breaththrough.  Voltaren, lidocaine patch.  Pending d/c to CIR per TOC  Hypertension: continue chlorthalidone, lisinopril - had d/c'd lasix in setting of thiazide  T2DM: poorly controlled, a1c 14.6.  Lantus, mealtime, SSI.  Adjust and follow.  Hold glipizide while inpatient. Diabetes education   Plan for insulin at discharge Will further increase lantus to 57 units and meal coverage to 12 units per diabetic coordinator recs  Chronic Pain: continue home oxycodone, lyrica as tolerated.   DVT prophylaxis:  SCD's Code Status: Full Family Communication: Pt in room, family not at bedside  Status is: Inpatient  Remains inpatient appropriate because:Ongoing diagnostic testing needed not appropriate for outpatient work up, Unsafe d/c plan and Inpatient level of care appropriate due to severity of illness   Dispo: The patient is from: Home              Anticipated d/c is to: CIR              Anticipated d/c date is: 2 days              Patient currently is not medically stable to d/c.    Consultants:   Neurology  PCCM  Procedures:   LP 9/8  Antimicrobials: Anti-infectives (From admission, onward)   None      Subjective: Tolerating diet. Denies pain at this time  Objective: Vitals:   04/10/20 2313 04/11/20 0438 04/11/20 0858 04/11/20 1252  BP: (!) 102/50 112/67 118/79 105/73  Pulse: 98 78 96 96  Resp: _0 Temp: 98 F (36.7 C) (!) 97.5 F (36.4 C) 98.2 F (36.8 C) 98.7 F (37.1 C)  TempSrc: Oral Oral Oral Oral  SpO2: 99% 100% 100% 97%  Weight:      Height:        Intake/Output Summary (Last 24 hours) at 04/11/2020 1506 Last data filed at 04/11/2020 0648 Gross per 24 hour  Intake --  Output 300 ml  Net -300 ml   Filed Weights   04/05/20 1647  Weight: (!) 154.3 kg    Examination: General exam: Awake, laying in bed, in  nad Respiratory system: Normal respiratory effort, no wheezing Cardiovascular system: regular rate, s1, s2 Gastrointestinal system: Soft, nondistended, positive BS Central nervous system: CN2-12 grossly intact, strength intact Extremities: Perfused, no clubbing Skin: Normal skin turgor, no notable skin lesions seen Psychiatry: Mood normal // no visual hallucinations   Data Reviewed: I have personally reviewed following labs and imaging studies  CBC: Recent Labs  Lab 04/05/20 1334 04/06/20 0444 04/07/20 0201 04/08/20 0340  WBC 6.3 5.8 5.5 5.9  NEUTROABS  --  2.3 1.9 2.1  HGB 14.0 13.4 12.9 12.0  HCT 44.6 41.8 40.8 38.1  MCV  87.6 88.0 88.5 88.2  PLT 223 210 215 440   Basic Metabolic Panel: Recent Labs  Lab 04/05/20 1334 04/06/20 0444 04/07/20 0201 04/08/20 0340  NA 133* 135 134* 134*  K 4.1 3.5 3.6 3.6  CL 100 103 102 102  CO2 _0 GLUCOSE 257* 300* 240* 225*  BUN _1 CREATININE 0.54 0.51 0.60 0.63  CALCIUM 9.5 8.9 8.9 8.8*  MG  --  1.6* 1.5* 1.9  PHOS  --  4.5 5.2* 4.4   GFR: Estimated Creatinine Clearance: 157.3 mL/min (by C-G formula based on SCr of 0.63 mg/dL). Liver Function Tests: Recent Labs  Lab 04/05/20 1334 04/06/20 0444 04/07/20 0201 04/08/20 0340  AST _2 ALT _3 ALKPHOS 72 78 62 66  BILITOT 0.9 0.6 0.8 0.9  PROT 7.0 7.1 7.7 8.1  ALBUMIN 3.0* 2.6* 2.7* 2.7*   No results for input(s): LIPASE, AMYLASE in the last 168 hours. No results for input(s): AMMONIA in the last 168 hours. Coagulation Profile: No results for input(s): INR, PROTIME in the last 168 hours. Cardiac Enzymes: No results for input(s): CKTOTAL, CKMB, CKMBINDEX, TROPONINI in the last 168 hours. BNP (last 3 results) No results for input(s): PROBNP in the last 8760 hours. HbA1C: No results for input(s): HGBA1C in the last 72 hours. CBG: Recent Labs  Lab 04/10/20 1139 04/10/20 1609 04/10/20 2053 04/11/20 0634 04/11/20 1143  GLUCAP 241* 229* 189* 187* 228*   Lipid Profile: No results for input(s): CHOL, HDL, LDLCALC, TRIG, CHOLHDL, LDLDIRECT in the last 72 hours. Thyroid Function Tests: No results for input(s): TSH, T4TOTAL, FREET4, T3FREE, THYROIDAB in the last 72 hours. Anemia Panel: No results for input(s): VITAMINB12, FOLATE, FERRITIN, TIBC, IRON, RETICCTPCT in the last 72 hours. Sepsis Labs: No results for input(s): PROCALCITON, LATICACIDVEN in the last 168 hours.  Recent Results (from the past 240 hour(s))  SARS Coronavirus 2 by RT PCR (hospital order, performed in [facility] hospital lab) Nasopharyngeal Nasopharyngeal Swab     Status: None   Collection Time:  04/01/20  4:15 PM   Specimen: Nasopharyngeal Swab  Result Value Ref Range Status   SARS Coronavirus 2 NEGATIVE NEGATIVE Final    Comment: (NOTE) SARS-CoV-2 target nucleic acids are NOT DETECTED.  The SARS-CoV-2 RNA is generally detectable in upper and lower respiratory specimens during the acute phase of infection. The lowest concentration of SARS-CoV-2 viral copies this assay can detect is 250 copies / mL. A negative result does not preclude SARS-CoV-2 infection and should not be used as the sole basis for treatment or other patient management decisions.  A negative result may occur with improper specimen collection / handling, submission of specimen other than nasopharyngeal swab, presence of viral mutation(s) within the areas targeted by this assay, and inadequate number of viral copies (<250 copies / mL). A negative  result must be combined with clinical observations, patient history, and epidemiological information.  Fact Sheet for Patients:   StrictlyIdeas.no  Fact Sheet for Healthcare Providers: BankingDealers.co.za  This test is not yet approved or  cleared by the [place] FDA and has been authorized for detection and/or diagnosis of SARS-CoV-2 by FDA under an Emergency Use Authorization (EUA).  This EUA will remain in effect (meaning this test can be used) for the duration of the COVID-19 declaration under Section 564(b)(1) of the Act, 21 U.S.C. section 360bbb-3(b)(1), unless the authorization is terminated or revoked sooner.  Performed at [facility], [place] [address] [place], [place] [address]   CSF culture     Status: None   Collection Time: 04/05/20 12:06 PM   Specimen: A: [facility] Cytology CSF; Cerebrospinal Fluid   B: [facility] Cytology CSF; Cerebrospinal Fluid   C: [facility] Cytology CSF; Cerebrospinal Fluid  Result Value Ref Range Status   Specimen Description CSF  Final   Special Requests NONE  Final   Gram Stain  NO ORGANISMS SEEN  Final   Culture   Final    NO GROWTH 3 DAYS Performed at [facility] [address], [place], [place] [address]    Report Status 04/08/2020 FINAL  Final     Radiology Studies: No results found.  Scheduled Meds: . diclofenac Sodium  2 g Topical QID  . docusate sodium  100 mg Oral BID  . insulin aspart  0-20 Units Subcutaneous TID WC  . insulin aspart  0-5 Units Subcutaneous QHS  . insulin aspart  10 Units Subcutaneous TID WC  . insulin glargine  55 Units Subcutaneous QHS  . insulin starter kit- pen needles  1 kit Other Once  . lidocaine  1 patch Transdermal Q24H  . lisinopril  2.5 mg Oral Daily  . living well with diabetes book   Does not apply Once  . nicotine  21 mg Transdermal Daily  . potassium chloride SA  20 mEq Oral Daily  . pregabalin  300 mg Oral BID  . topiramate  50 mg Oral q AM   Continuous Infusions:    LOS: 8 days   Marylu Lund, MD [facility] Pager On Amion  If 7PM-7AM, please contact night-coverage 04/11/2020, 3:06 PM

## 2020-04-12 DIAGNOSIS — R29898 Other symptoms and signs involving the musculoskeletal system: Secondary | ICD-10-CM

## 2020-04-12 LAB — GLUCOSE, CAPILLARY: Glucose-Capillary: 134 mg/dL — ABNORMAL HIGH (ref 70–99)

## 2020-04-12 MED ORDER — POLYETHYLENE GLYCOL 3350 17 G PO PACK: 17.0000 g | PACK | Freq: Every day | ORAL | 0 refills | Status: DC | PRN

## 2020-04-12 MED ORDER — LIDOCAINE 5 % EX PTCH: 1.0000 | MEDICATED_PATCH | CUTANEOUS | 0 refills | Status: DC

## 2020-04-12 MED ORDER — FUROSEMIDE 40 MG PO TABS
20.0000 mg | ORAL_TABLET | Freq: Every morning | ORAL | Status: DC
Start: 2020-04-12 — End: 2023-02-27

## 2020-04-12 MED ORDER — INSULIN ASPART 100 UNIT/ML ~~LOC~~ SOLN: 12.0000 [IU] | Freq: Three times a day (TID) | SUBCUTANEOUS | 11 refills | Status: DC

## 2020-04-12 MED ORDER — INSULIN GLARGINE 100 UNIT/ML ~~LOC~~ SOLN
55.0000 [IU] | Freq: Every day | SUBCUTANEOUS | 11 refills | Status: DC
Start: 2020-04-12 — End: 2023-02-27

## 2020-04-12 MED ORDER — NICOTINE 21 MG/24HR TD PT24: 21.0000 mg | MEDICATED_PATCH | Freq: Every day | TRANSDERMAL | 0 refills | Status: DC

## 2020-04-12 MED ORDER — NICOTINE POLACRILEX 2 MG MT GUM: 2.0000 mg | CHEWING_GUM | OROMUCOSAL | 0 refills | Status: DC | PRN

## 2020-04-12 MED ORDER — DOCUSATE SODIUM 100 MG PO CAPS: 100.0000 mg | ORAL_CAPSULE | Freq: Two times a day (BID) | ORAL | 0 refills | Status: DC

## 2020-04-12 NOTE — Discharge Summary (Addendum)
[facility]  Physician Discharge Summary   Patient ID: Nancy [name] MRN: [id number] DOB/AGE: July 23, 1986 34 y.o.  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 Please do blood work, CBC and basic metabolic panel in 1 to 3 days. 2. And ambulatory referral has been sent to Dr. Allena KatzPatel with [facility] for follow-up in 4 to 6 weeks for nerve conduction velocity/EMG studies. 3. Please check CBGs before each meal and at bedtime.  Sliding scale per institution protocol. 4. Please prescribe glucometer to the patient at the time of discharge from inpatient rehab.     CODE STATUS: Full code  DISCHARGE CONDITION: fair  Diet recommendation: Modified carbohydrate  INITIAL HISTORY: 34 yo F with hx of obesity, tobacco abuse, T2DM, mood disorder, and multiple other medical problems who presented initially about 1 month ago with left upper extremity weakness. She notes her L arm and hand "stopped working". She also notes incontinence and leg weakness prior to presentation. She was seen by neurosurgery who admitted her for an expedited workup. Subsequently transferred to hospitalist service.  Consultations:  Neurology  Neurosurgery  Procedures:  Lumbar puncture   HOSPITAL COURSE:   Left Upper Extremity Weakness  Lower Extremity Weakness  Incontinence:  Concern for AIDP or CIDP or diabetic polyradiculoneuropathy MRI brain, C/T/L spine without acute findings LP noted to have 11,500 RBC's, 148 glucose, protein 167 - gram stain without organisms seen. Follow oligoclonal bands. Follow [place] serologies (pending). Neurology c/s, appreciate recs. Pt completed 5 day course of IVIG Neurosurgery, appreciate recs - needs EMG nerveconduction studies once discharged.  Referral has been sent to neurology for same. Patient otherwise remains medically stable.  She has noticed some improvement in.  Right leg weakness persists.  She also slid off the side of the bed yesterday and landed on the floor.  Denies any injuries.  No pain currently. She has been seen by physical and Occupational Therapy.  Inpatient rehabilitation was recommended.  Patient will be [facility] to [facility].  Essential hypertension Stable.  Continue home medications.    Diabetes mellitus type 2, uncontrolled with hyperglycemia and obesity HbA1c 14.6.  Continue Lantus.  Continue at this time.  Follow CBGs.  Adjust insulin depending on CBGs.   Chronic Pain Continue home medications.  Morbid obesity Estimated body mass index is 53.28 kg/m as calculated from the following:   Height as of this encounter: 5\' 7"  (1.702 m).   Weight as of this encounter: 154.3 kg.  Patient remained stable.  Okay for discharge to inpatient rehab today.  PATIENT BEING DISCHARGED TO [facility]  PERTINENT LABS:  The results of significant diagnostics from this hospitalization (including imaging, microbiology, ancillary and laboratory) are listed below for reference.    Microbiology: Recent Results (from the past 240 hour(s))  CSF culture     Status: None   Collection Time: 04/05/20 12:06 PM   Specimen: A: PATH Cytology CSF; Cerebrospinal Fluid   B: PATH Cytology CSF; Cerebrospinal Fluid   C: PATH Cytology CSF; Cerebrospinal Fluid  Result Value Ref Range Status   Specimen Description CSF  Final   Special Requests NONE  Final   Gram Stain  NO ORGANISMS SEEN  Final   Culture   Final    NO GROWTH 3 DAYS Performed at [facility], [address], [place], [place]  [address]    Report Status 04/08/2020 FINAL  Final     Labs:    Basic Metabolic Panel: Recent Labs  Lab 04/05/20 1334 04/06/20 0444 04/07/20 0201 04/08/20 0340  NA 133* 135 134* 134*  K 4.1 3.5 3.6 3.6  CL 100 103 102 102  CO2 24 24 25 25   GLUCOSE 257* 300* 240* 225*  BUN 8 11 14 16   CREATININE 0.54 0.51 0.60 0.63  CALCIUM 9.5 8.9 8.9 8.8*  MG  --  1.6* 1.5* 1.9  PHOS  --  4.5 5.2* 4.4   Liver Function Tests: Recent Labs  Lab 04/05/20 1334 04/06/20 0444 04/07/20 0201 04/08/20 0340  AST 20 15 20 21   ALT 19 16 19 19   ALKPHOS 72 78 62 66  BILITOT 0.9 0.6 0.8 0.9  PROT 7.0 7.1 7.7 8.1  ALBUMIN 3.0* 2.6* 2.7* 2.7*   CBC: Recent Labs  Lab 04/05/20 1334 04/06/20 0444 04/07/20 0201 04/08/20 0340  WBC 6.3 5.8 5.5 5.9  NEUTROABS  --  2.3 1.9 2.1  HGB 14.0 13.4 12.9 12.0  HCT 44.6 41.8 40.8 38.1  MCV 87.6 88.0 88.5 88.2  PLT 223 210 215 211    CBG: Recent Labs  Lab 04/11/20 0634 04/11/20 1143 04/11/20 1626 04/11/20 2040 04/12/20 0623  GLUCAP 187* 228* 166* 187* 134*     IMAGING STUDIES MR BRAIN W WO CONTRAST  Result Date: 04/02/2020 CLINICAL DATA:  Encephalopathy.  Bilateral lower extremity weakness. EXAM: MRI HEAD WITHOUT AND WITH CONTRAST TECHNIQUE: Multiplanar, multiecho pulse sequences of the brain and surrounding structures were obtained without and with intravenous contrast. CONTRAST:  19mL GADAVIST GADOBUTROL 1 MMOL/ML IV SOLN COMPARISON:  None. FINDINGS: Brain: No acute infarct, acute hemorrhage or extra-axial collection. Normal white matter signal. Normal volume of CSF spaces. No chronic microhemorrhage. Normal midline structures. Vascular: Normal flow voids. Skull and upper cervical spine: Normal marrow signal. Sinuses/Orbits: Negative. Other: None. IMPRESSION: Normal brain MRI. Electronically Signed   By: 04/13/20 M.D.   On: 04/02/2020 00:16   MR CERVICAL SPINE WO CONTRAST  Result Date: 04/02/2020 CLINICAL DATA:  Bilateral lower extremity  weakness EXAM: MRI CERVICAL SPINE WITHOUT CONTRAST TECHNIQUE: Multiplanar, multisequence MR imaging of the cervical spine was performed. No intravenous contrast was administered. COMPARISON:  None. FINDINGS: Alignment: Physiologic.  Straightening of normal cervical lordosis. Vertebrae: No fracture, evidence of discitis, or bone lesion. Cord: Normal signal and morphology. Posterior Fossa, vertebral arteries, paraspinal tissues: Negative. Disc levels: No disc herniation or stenosis. IMPRESSION: Straightening of normal cervical lordosis, which may be positional or due to muscle spasm. Otherwise normal cervical spine MRI. Electronically Signed   By: 9m M.D.   On: 04/02/2020 00:32   MR THORACIC SPINE WO CONTRAST  Result Date: 04/02/2020 CLINICAL DATA:  Difficulty walking. EXAM: MRI THORACIC SPINE WITHOUT CONTRAST TECHNIQUE: Multiplanar, multisequence MR imaging of the thoracic spine was performed. No intravenous contrast was administered. COMPARISON:  None. FINDINGS: Alignment:  Physiologic. Vertebrae: No fracture, evidence of discitis, or bone lesion. Cord:  Normal signal and morphology. Paraspinal and other soft tissues: Negative. Disc levels: No spinal canal stenosis. IMPRESSION: Normal thoracic spine MRI. Electronically Signed   By: 06/02/2020 M.D.   On: 04/02/2020 00:38   MR Lumbar Spine W Wo Contrast  Result Date: 04/02/2020 CLINICAL DATA:  Encephalopathy and difficulty walking EXAM: MRI LUMBAR SPINE WITHOUT AND WITH CONTRAST TECHNIQUE: Multiplanar and multiecho pulse sequences of the lumbar spine were obtained without and with intravenous contrast. CONTRAST:  74mL GADAVIST GADOBUTROL 1 MMOL/ML IV SOLN COMPARISON:  None. FINDINGS: Segmentation:  Standard. Alignment:  Physiologic. Vertebrae:  No fracture, evidence of discitis, or bone lesion. Conus medullaris and cauda equina: Conus extends to the L1 level. Conus and cauda equina appear normal. Paraspinal and other soft tissues: Negative. Disc  levels: No disc herniation, spinal canal stenosis or neural impingement. No abnormal contrast enhancement. IMPRESSION: Normal MRI of the lumbar spine. Electronically Signed   By: Deatra Robinson M.D.   On: 04/02/2020 00:50   DG FL GUIDED LUMBAR PUNCTURE  Result Date: 04/05/2020 CLINICAL DATA:  Bilateral lower extremity weakness, encephalopathy. EXAM: DIAGNOSTIC LUMBAR PUNCTURE UNDER FLUOROSCOPIC GUIDANCE FLUOROSCOPY TIME:  Radiation Exposure Index (if provided by the fluoroscopic device): 523.2 mGy. PROCEDURE: Informed consent was obtained from the patient prior to the procedure, including potential complications of headache, allergy, and pain. With the patient prone, the lower back was prepped with Betadine. 1% Lidocaine was used for local anesthesia. Lumbar puncture was performed at the L4-5 level using a 7 inch 22 gauge needle with return of slightly blood tinged CSF. Due to the difficulty obtaining fluid, pressure was not measured. 10 ml of CSF were obtained for laboratory studies. The patient tolerated the procedure well and there were no apparent complications. IMPRESSION: Under fluoroscopic guidance, lumbar puncture was performed for diagnostic purposes. Electronically Signed   By: Lupita Raider M.D.   On: 04/05/2020 12:58    DISCHARGE EXAMINATION: Vitals:   04/12/20 0023 04/12/20 0045 04/12/20 0448 04/12/20 0820  BP: 104/68 (!) 128/97 105/73 125/76  Pulse: 86 91 77 90  Resp: 16 16 16 16   Temp: 98.3 F (36.8 C) 98.3 F (36.8 C) (!) 97.4 F (36.3 C) 98.2 F (36.8 C)  TempSrc: Oral Oral Oral Oral  SpO2: 100% 100% 100% 100%  Weight:      Height:       General appearance: Awake alert.  In no distress Resp: Clear to auscultation bilaterally.  Normal effort Cardio: S1-S2 is normal regular.  No S3-S4.  No rubs murmurs or bruit GI: Abdomen is soft.  Nontender nondistended.  Bowel sounds are present normal.  No masses organomegaly Neurologic: Alert and oriented x3.  Weakness noted in left arm  as before.   DISPOSITION: To inpatient rehab at [facility]  Discharge Instructions    Ambulatory referral to Neurology   Complete by: As directed    An appointment is requested in approximately: 4 weeks   Call MD for:  difficulty breathing, headache or visual disturbances   Complete by: As directed    Call MD for:  extreme fatigue   Complete by: As directed    Call MD for:  persistant dizziness or light-headedness   Complete by: As directed    Call MD for:  persistant nausea and vomiting   Complete by: As directed    Call MD for:  severe uncontrolled pain   Complete by: As directed    Call MD for:  temperature >100.4   Complete by: As directed    Diet Carb Modified   Complete by: As directed    Discharge instructions   Complete by: As directed    Please review instructions on the discharge summary.  You were cared for by a hospitalist during your hospital stay. If you have any questions about  your discharge medications or the care you received while you were in the hospital after you are discharged, you can call the unit and asked to speak with the hospitalist on call if the hospitalist that took care of you is not available. Once you are discharged, your primary care physician will handle any further medical issues. Please note that NO REFILLS for any discharge medications will be authorized once you are discharged, as it is imperative that you return to your primary care physician (or establish a relationship with a primary care physician if you do not have one) for your aftercare needs so that they can reassess your need for medications and monitor your lab values. If you do not have a primary care physician, you can call [phone] for a physician referral.   Increase activity slowly   Complete by: As directed         Allergies as of 04/12/2020      Reactions   Asa Buff (mag [buffered Aspirin] Anaphylaxis, Shortness Of Breath, Swelling, Other (See Comments)   "Made my throat become  swollen to the point of closing"   Aspirin Anaphylaxis, Shortness Of Breath, Swelling, Other (See Comments)   "Made my throat become swollen to the point of closing"   Tolmetin Shortness Of Breath, Other (See Comments)   TOLECTIN (tolmetin sodium) is indicated for the relief of signs and symptoms of rheumatoid arthritis and osteoarthritis   Prozac [fluoxetine Hcl] Other (See Comments)   Reaction not recalled      Medication List    STOP taking these medications   potassium chloride 10 MEQ tablet Commonly known as: KLOR-CON     TAKE these medications   chlorthalidone 25 MG tablet Commonly known as: HYGROTON Take 25 mg by mouth See admin instructions. Take 25 mg by mouth two times a day as long as the Systolic number is 140 or greater   cyclobenzaprine 10 MG tablet Commonly known as: FLEXERIL Take 10 mg by mouth in the morning, at noon, and at bedtime.   docusate sodium 100 MG capsule Commonly known as: COLACE Take 1 capsule (100 mg total) by mouth 2 (two) times daily.   EPINEPHrine 0.3 mg/0.3 mL Soaj injection Commonly known as: EPI-PEN Inject 0.3 mg into the muscle as needed for anaphylaxis.   etodolac 600 MG 24 hr tablet Commonly known as: LODINE XL Take 600 mg by mouth daily at 12 noon.   furosemide 40 MG tablet Commonly known as: LASIX Take 0.5 tablets (20 mg total) by mouth in the morning. What changed: how much to take   glipiZIDE 5 MG tablet Commonly known as: GLUCOTROL Take 5 mg by mouth 2 (two) times daily before a meal.   insulin aspart 100 UNIT/ML injection Commonly known as: novoLOG Inject 12 Units into the skin 3 (three) times daily with meals.   insulin glargine 100 UNIT/ML injection Commonly known as: LANTUS Inject 0.55 mLs (55 Units total) into the skin at bedtime. What changed: how much to take   Klor-Con M20 20 MEQ tablet Generic drug: potassium chloride SA Take 20 mEq by mouth daily. What changed: Another medication with the same name was  removed. Continue taking this medication, and follow the directions you see here.   lidocaine 5 % Commonly known as: LIDODERM Place 1 patch onto the skin daily. Remove & Discard patch within 12 hours or as directed by MD   lisinopril 2.5 MG tablet Commonly known as: ZESTRIL Take 2.5 mg by mouth daily.  nicotine 21 mg/24hr patch Commonly known as: NICODERM CQ - dosed in mg/24 hours Place 1 patch (21 mg total) onto the skin daily. Start taking on: April 13, 2020   nicotine polacrilex 2 MG gum Commonly known as: NICORETTE Take 1 each (2 mg total) by mouth as needed for smoking cessation.   Oxycodone HCl 20 MG Tabs Take 20 mg by mouth in the morning, at noon, and at bedtime. What changed: Another medication with the same name was removed. Continue taking this medication, and follow the directions you see here.   polyethylene glycol 17 g packet Commonly known as: MIRALAX / GLYCOLAX Take 17 g by mouth daily as needed for mild constipation.   pregabalin 300 MG capsule Commonly known as: LYRICA Take 300 mg by mouth 2 (two) times daily.   topiramate 50 MG tablet Commonly known as: TOPAMAX Take 50 mg by mouth in the morning. What changed: Another medication with the same name was removed. Continue taking this medication, and follow the directions you see here.   Voltaren 1 % Gel Generic drug: diclofenac Sodium Apply 2-8 g topically See admin instructions. Apply 2-8 grams to each leg nightly         Follow-up Information    Burdine, Ananias Pilgrim, MD. Schedule an appointment as soon as possible for a visit in 2 week(s).   Specialty: Family Medicine Contact information: [address] [place] [address] [phone]        Nita Sickle K, DO Follow up in 4 week(s).   Specialty: Neurology Why: Referral sent via epic. For ?GBS, needs NCV/EMG Contact information: [address] [place] [address] [phone]               TOTAL DISCHARGE TIME:  35 minutes  Makiyla Linch Rito Ehrlich  [facility] Pager on [email or web address]  04/12/2020, 10:18 AM

## 2020-04-12 NOTE — Progress Notes (Signed)
Patient refuses to remove previous nicotine patches.

## 2020-04-12 NOTE — Progress Notes (Signed)
HOSPITAL MEDICINE OVERNIGHT EVENT NOTE    Notified by nursing that patient fell out of bed.  Patient slid off of edge of bed.  Patient exhibited no loss of consciousness and did not suffer any head trauma.  Patient was assisted back into bed and denies any pain.  No further work-up pursued at this time.  Marinda Elk  MD [facility]

## 2020-04-12 NOTE — Discharge Instructions (Signed)
Guillain-Barr Syndrome Guillain-Barr syndrome (GBS) is a rare disorder in which the body's disease-fighting system (immune system) attacks the nerves. This causes numbness and tingling. It can eventually develop into a serious condition and, in some cases, cause paralysis. GBS does not spread from person to person (is not contagious). Most people with GBS recover within a few months. However, some people may have muscle weakness that lasts for a few years. What are the causes? The exact cause of GBS is not known. In most cases, GBS develops a few days or weeks after you have an infection, such as:  A stomach infection caused by Campylobacter bacteria. This type of infection usually causes diarrhea.  An infection of the nose, throat, and upper airways (respiratory infection), such as a cold or the flu.  A viral infection. Less commonly, GBS may be triggered by:  Surgery.  A vaccine. What are the signs or symptoms? The most common first symptoms are tingling, numbness, and weakness in the lower legs. Other symptoms may develop over hours, days, or weeks. These may include:  Muscle weakness that spreads from the legs to the abdomen and arms.  Aching or burning pain.  Weakness of muscles in the face.  Trouble chewing or swallowing.  Slurred speech.  Inability to move the arms, legs, or both (paralysis).  Difficulty breathing. How is this diagnosed? This condition may be diagnosed based on:  Your symptoms and medical history. Your health care provider will ask about any recent infections you have had.  A physical exam.  A test that measures how quickly your nerves carry signals to your muscles (nerve conduction velocity test).  Testing a sample of fluid that surrounds the spinal cord (lumbar puncture). How is this treated? If your symptoms are mild, you may be given medicines to control your symptoms. If your condition becomes severe, you may need to be treated at a hospital.  At the hospital, treatment may include:  Medicines. These help to improve numbness or tingling sensations in your arms and legs (paresthesias) caused by irritation of the nerves.  Plasma exchange (plasmapheresis). This is a procedure in which the immune system cells that are attacking your nerves are removed from your blood.  Getting proteins (immunoglobulins or antibodies) that slow down the immune system's attack on your nerves. These may be given through an IV inserted into one of your veins.  Oxygen and breathing assistance. Treatment may also include physical therapy to help strengthen your muscles. After being treated in the hospital, you may be transferred to a rehabilitation center to get intensive physical therapy. Once your strength improves, you may continue to get physical therapy at home. Additional physical therapy may be needed for many months. Follow these instructions at home:   If physical therapy was prescribed, do exercises as told by your health care provider.  Make sure you have the support you need. Someone may need to help you bathe, dress, and prepare meals until you regain your strength.  Rest as needed. Return to your normal activities as told by your health care provider. Ask your health care provider what activities are safe for you.  Take over-the-counter and prescription medicines only as told by your health care provider.  Keep all follow-up visits as told by your health care provider. This is important. Contact a health care provider if you:  Have new symptoms or your symptoms get worse.  Do not feel like you are getting better or regaining strength.  Do not feel safe   or supported at home.  Are having trouble coping with your recovery. Get help right away if you:  Have trouble breathing.  Have trouble swallowing.  Develop a fever.  Choke after eating or drinking.  Cannot move.  Faint.  Have pain, swelling, warmth, or redness in an arm or  leg.  Have chest pain. Summary  Guillain-Barr syndrome (GBS) is a rare disorder in which the body's disease-fighting system (immune system) attacks the nerves.  GBS often requires treatment at a hospital. Treatment in the hospital may include medicines, plasmapheresis, getting proteins that slow down the immune system's attack on your nerves, or oxygen and breathing assistance.  Months of physical therapy may be needed until you regain your strength. This information is not intended to replace advice given to you by your health care provider. Make sure you discuss any questions you have with your health care provider. Document Revised: 02/12/2019 Document Reviewed: 07/20/2017 Elsevier Patient Education  2020 Elsevier Inc[employer]  

## 2020-04-12 NOTE — TOC Transition Note (Signed)
Transition of Care [facility]) - CM/SW Discharge Note   Patient Details  Name: Nancy [name] MRN: [id number] Date of Birth: 1986/03/03  [facility] CM/SW Contact:  Kermit Balo, RN Phone Number: 04/12/2020, 10:54 AM   Clinical Narrative:    Pt discharging to [facility] today. Pt has chosen to have family provide the transportation to the facility. Jase with [facility] is aware and in agreement.  Pt has d/c packet and bedside RN updated.   Number for report: [phone]   Final next level of care: IP Rehab Facility Barriers to Discharge: No Barriers Identified   Patient Goals and CMS Choice   CMS Medicare.gov Compare Post Acute Care list provided to:: Patient Choice offered to / list presented to : Patient  Discharge Placement                       Discharge Plan and Services   Discharge Planning Services: CM Consult Post Acute Care Choice: IP Rehab                               Social Determinants of Health (SDOH) Interventions     Readmission Risk Interventions No flowsheet data found.

## 2020-04-12 NOTE — Plan of Care (Signed)
  Problem: Education: Goal: Knowledge of General Education information will improve Description: Including pain rating scale, medication(s)/side effects and non-pharmacologic comfort measures 04/12/2020 1233 by Luellen Pucker, RN Outcome: Adequate for Discharge 04/12/2020 1107 by Luellen Pucker, RN Outcome: Progressing   Problem: Health Behavior/Discharge Planning: Goal: Ability to manage health-related needs will improve Outcome: Adequate for Discharge   Problem: Clinical Measurements: Goal: Ability to maintain clinical measurements within normal limits will improve Outcome: Adequate for Discharge Goal: Will remain free from infection Outcome: Adequate for Discharge Goal: Diagnostic test results will improve Outcome: Adequate for Discharge Goal: Respiratory complications will improve 04/12/2020 1233 by Luellen Pucker, RN Outcome: Adequate for Discharge 04/12/2020 1107 by Luellen Pucker, RN Outcome: Progressing Goal: Cardiovascular complication will be avoided Outcome: Adequate for Discharge   Problem: Activity: Goal: Risk for activity intolerance will decrease 04/12/2020 1233 by Luellen Pucker, RN Outcome: Adequate for Discharge 04/12/2020 1107 by Luellen Pucker, RN Outcome: Progressing   Problem: Nutrition: Goal: Adequate nutrition will be maintained Outcome: Adequate for Discharge   Problem: Coping: Goal: Level of anxiety will decrease Outcome: Adequate for Discharge   Problem: Elimination: Goal: Will not experience complications related to bowel motility Outcome: Adequate for Discharge Goal: Will not experience complications related to urinary retention Outcome: Adequate for Discharge   Problem: Pain Managment: Goal: General experience of comfort will improve Outcome: Adequate for Discharge   Problem: Safety: Goal: Ability to remain free from injury will improve Outcome: Adequate for Discharge   Problem: Skin Integrity: Goal: Risk for impaired skin integrity  will decrease Outcome: Adequate for Discharge

## 2020-04-12 NOTE — Progress Notes (Signed)
Inpatient Diabetes Program Recommendations  AACE/ADA: New Consensus Statement on Inpatient Glycemic Control   Target Ranges:  Prepandial:   less than 140 mg/dL      Peak postprandial:   less than 180 mg/dL (1-2 hours)      Critically ill patients:  140 - 180 mg/dL   Results for CHITARA, CLONCH (MRN [id number]) as of 04/12/2020 11:14  Ref. Range 04/11/2020 06:34 04/11/2020 11:43 04/11/2020 16:26 04/11/2020 20:40 04/12/2020 06:23  Glucose-Capillary Latest Ref Range: 70 - 99 mg/dL 195 (H) 093 (H) 267 (H) 187 (H) 134 (H)   Review of Glycemic Control  Diabetes history: DM2 Outpatient Diabetes medications: Lantus 40 units daily (not taken in over a year), Glipizide 5 mg BID (takes occasionally) Current orders for Inpatient glycemic control: Lantus 57 units QHS, Novolog 12 units TID with meals, Novolog 0-20 units TID with meals, Novolog 0-5 units QHS  NOTE: Received page regarding [employer]. Patient is going to inpatient rehab so not able to place [facility] on patient when discharged from the hospital. Patient would like to get more information on the FreeStyle [place] so PCP can prescribe after she is discharged from rehab. Spoke with patient at bedside regarding [employer] [facility] and provided packet of written information on it. Encouraged patient to contact her PCP about a week prior to discharge so that PCP can be working on getting [employer] [facility] for her so she can start it once she is discharged from inpatient rehab. Patient asked questions about sliding scale insulin Discussed Novolog correction scale as well as Novolog meal coverage. Explained how the Novolog correction scale works and explained that she should have a written out scale to determine dose. Patient verbalized understanding of information discussed and states that no other questions or concerns at this time. Patient has orders to be discharged today.  Thanks, Orlando Penner, RN, MSN, CDE Diabetes Coordinator Inpatient Diabetes  Program [phone] (Team Pager from 8am to 5pm)

## 2020-04-12 NOTE — Progress Notes (Signed)
Nsg Discharge Note  Admit Date:  04/01/2020 Discharge date: 04/12/2020   [name] to be D/C'd Inpatient rehab per MD order.  AVS completed.  Copy for patient provided. Patient/caregiver able to verbalize understanding.  Discharge Medication: Allergies as of 04/12/2020      Reactions   Asa Buff (mag [buffered Aspirin] Anaphylaxis, Shortness Of Breath, Swelling, Other (See Comments)   "Made my throat become swollen to the point of closing"   Aspirin Anaphylaxis, Shortness Of Breath, Swelling, Other (See Comments)   "Made my throat become swollen to the point of closing"   Tolmetin Shortness Of Breath, Other (See Comments)   TOLECTIN (tolmetin sodium) is indicated for the relief of signs and symptoms of rheumatoid arthritis and osteoarthritis   Prozac [fluoxetine Hcl] Other (See Comments)   Reaction not recalled      Medication List    STOP taking these medications   potassium chloride 10 MEQ tablet Commonly known as: KLOR-CON     TAKE these medications   chlorthalidone 25 MG tablet Commonly known as: HYGROTON Take 25 mg by mouth See admin instructions. Take 25 mg by mouth two times a day as long as the Systolic number is 140 or greater Notes to patient: Tonight, 04/12/20.   cyclobenzaprine 10 MG tablet Commonly known as: FLEXERIL Take 10 mg by mouth in the morning, at noon, and at bedtime. Notes to patient: This afternoon, 04/12/20.   docusate sodium 100 MG capsule Commonly known as: COLACE Take 1 capsule (100 mg total) by mouth 2 (two) times daily. Notes to patient: Tonight, 04/12/20.   EPINEPHrine 0.3 mg/0.3 mL Soaj injection Commonly known as: EPI-PEN Inject 0.3 mg into the muscle as needed for anaphylaxis.   etodolac 600 MG 24 hr tablet Commonly known as: LODINE XL Take 600 mg by mouth daily at 12 noon. Notes to patient: This afternoon, 04/12/20.   furosemide 40 MG tablet Commonly known as: LASIX Take 0.5 tablets (20 mg total) by mouth in the morning. What  changed: how much to take Notes to patient: Tomorrow, 04/13/20.   glipiZIDE 5 MG tablet Commonly known as: GLUCOTROL Take 5 mg by mouth 2 (two) times daily before a meal. Notes to patient: Tonight, 04/12/20.   insulin aspart 100 UNIT/ML injection Commonly known as: novoLOG Inject 12 Units into the skin 3 (three) times daily with meals. Notes to patient: Today before lunch, 04/12/20.   insulin glargine 100 UNIT/ML injection Commonly known as: LANTUS Inject 0.55 mLs (55 Units total) into the skin at bedtime. What changed: how much to take Notes to patient: Today before lunch, 04/12/20.   Klor-Con M20 20 MEQ tablet Generic drug: potassium chloride SA Take 20 mEq by mouth daily. What changed: Another medication with the same name was removed. Continue taking this medication, and follow the directions you see here. Notes to patient: Tomorrow, 04/13/20.   lidocaine 5 % Commonly known as: LIDODERM Place 1 patch onto the skin daily. Remove & Discard patch within 12 hours or as directed by MD Notes to patient: Tomorrow, 04/13/20.   lisinopril 2.5 MG tablet Commonly known as: ZESTRIL Take 2.5 mg by mouth daily. Notes to patient: Tomorrow, 04/13/20.   nicotine 21 mg/24hr patch Commonly known as: NICODERM CQ - dosed in mg/24 hours Place 1 patch (21 mg total) onto the skin daily. Start taking on: April 13, 2020 Notes to patient: Tomorrow, 04/13/20.   nicotine polacrilex 2 MG gum Commonly known as: NICORETTE Take 1 each (2 mg total) by mouth  as needed for smoking cessation.   Oxycodone HCl 20 MG Tabs Take 20 mg by mouth in the morning, at noon, and at bedtime. What changed: Another medication with the same name was removed. Continue taking this medication, and follow the directions you see here.   polyethylene glycol 17 g packet Commonly known as: MIRALAX / GLYCOLAX Take 17 g by mouth daily as needed for mild constipation.   pregabalin 300 MG capsule Commonly known as:  LYRICA Take 300 mg by mouth 2 (two) times daily. Notes to patient: Tonight, 04/12/20.   topiramate 50 MG tablet Commonly known as: TOPAMAX Take 50 mg by mouth in the morning. What changed: Another medication with the same name was removed. Continue taking this medication, and follow the directions you see here. Notes to patient: Tomorrow, 04/13/20.   Voltaren 1 % Gel Generic drug: diclofenac Sodium Apply 2-8 g topically See admin instructions. Apply 2-8 grams to each leg nightly Notes to patient: Tonight, 04/12/20.       Discharge Assessment: Vitals:   04/12/20 0448 04/12/20 0820  BP: 105/73 125/76  Pulse: 77 90  Resp: 16 16  Temp: (!) 97.4 F (36.3 C) 98.2 F (36.8 C)  SpO2: 100% 100%   Skin clean, dry and intact without evidence of skin break down, no evidence of skin tears noted. IV catheter discontinued intact. Site without signs and symptoms of complications - no redness or edema noted at insertion site, patient denies c/o pain - only slight tenderness at site.  Dressing with slight pressure applied.  D/c Instructions-Education: Discharge instructions given to patient/family with verbalized understanding. Patient verbalizes understanding to report to Inpatient Rehab in [place].  D/c education completed with patient/family including follow up instructions, medication list, d/c activities limitations if indicated, with other d/c instructions as indicated by MD - patient able to verbalize understanding, all questions fully answered. Patient instructed to return to ED, call 911, or call MD for any changes in condition.  Patient escorted via WC, and D/C to inpatient rehab via private auto.  Luellen Pucker, RN 04/12/2020 12:32 PM

## 2020-04-12 NOTE — Plan of Care (Signed)
  Problem: Education: Goal: Knowledge of General Education information will improve Description: Including pain rating scale, medication(s)/side effects and non-pharmacologic comfort measures Outcome: Progressing     Problem: Activity: Goal: Risk for activity intolerance will decrease Outcome: Progressing  Patient denies dyspnea on transfer to [facility], uses walker.

## 2020-04-12 NOTE — Progress Notes (Signed)
Report called to alda, RN at [facility] inpatient rehab center.

## 2020-04-12 NOTE — Progress Notes (Signed)
Patient had an unwitnessed fall at 20. Patient was sitting on the side of the bed with bed alarm on. Tech was just in the patients room 15-20 minutes prior and the bed alarm was engaged. Patient stated she just slid out of the bed to the floor as her lower extremities are weak and she could not stop herself. Patient denies any pain and has no visible signs of injury. Vital signs are within normal limits, patient also shows no signs of confusion or loss of consciousness. Patient was assisted back in bed by tech.

## 2020-04-12 NOTE — Progress Notes (Signed)
PT Cancellation Note  Patient Details Name: Nancy [name] MRN: [id number] DOB: 1986-02-14   Cancelled Treatment:    Reason Eval/Treat Not Completed: Other (comment)  Patient preparing for discharge to outside CIR facility. She reports her sister is going to transport her and she has no concerns re: getting in/out of car (despite fall last night) as her sister brought her to the hospital. She wishes to defer PT at this time as she wants to save her energy for trip to CIR.   [name], PT Pager [phone]   [name] 04/12/2020, 11:42 AM

## 2020-04-13 ENCOUNTER — Encounter: Payer: Self-pay | Admitting: Neurology

## 2020-04-27 LAB — ARBOVIRUS PANEL, ~~LOC~~ LAB

## 2020-04-28 ENCOUNTER — Other Ambulatory Visit: Payer: Self-pay

## 2020-04-28 ENCOUNTER — Encounter: Payer: Self-pay | Admitting: Neurology

## 2020-04-28 ENCOUNTER — Ambulatory Visit (INDEPENDENT_AMBULATORY_CARE_PROVIDER_SITE_OTHER): Admitting: Neurology

## 2020-04-28 VITALS — BP 153/101 | HR 99 | Ht 66.0 in | Wt 359.0 lb

## 2020-04-28 DIAGNOSIS — R202 Paresthesia of skin: Secondary | ICD-10-CM | POA: Diagnosis not present

## 2020-04-28 DIAGNOSIS — E1142 Type 2 diabetes mellitus with diabetic polyneuropathy: Secondary | ICD-10-CM

## 2020-04-28 DIAGNOSIS — E1042 Type 1 diabetes mellitus with diabetic polyneuropathy: Secondary | ICD-10-CM | POA: Insufficient documentation

## 2020-04-28 NOTE — Patient Instructions (Signed)
Nerve testing of the left arm and leg  Return to clinic in January   ELECTROMYOGRAM AND NERVE CONDUCTION STUDIES (EMG/NCS) INSTRUCTIONS  How to Prepare The neurologist conducting the EMG will need to know if you have certain medical conditions. Tell the neurologist and other EMG lab personnel if you: . Have a pacemaker or any other electrical medical device . Take blood-thinning medications . Have hemophilia, a blood-clotting disorder that causes prolonged bleeding Bathing Take a shower or bath shortly before your exam in order to remove oils from your skin. Don't apply lotions or creams before the exam.  What to Expect You'll likely be asked to change into a hospital gown for the procedure and lie down on an examination table. The following explanations can help you understand what will happen during the exam.  . Electrodes. The neurologist or a technician places surface electrodes at various locations on your skin depending on where you're experiencing symptoms. Or the neurologist may insert needle electrodes at different sites depending on your symptoms.  . Sensations. The electrodes will at times transmit a tiny electrical current that you may feel as a twinge or spasm. The needle electrode may cause discomfort or pain that usually ends shortly after the needle is removed. If you are concerned about discomfort or pain, you may want to talk to the neurologist about taking a short break during the exam.  . Instructions. During the needle EMG, the neurologist will assess whether there is any spontaneous electrical activity when the muscle is at rest - activity that isn't present in healthy muscle tissue - and the degree of activity when you slightly contract the muscle.  He or she will give you instructions on resting and contracting a muscle at appropriate times. Depending on what muscles and nerves the neurologist is examining, he or she may ask you to change positions during the exam.  After  your EMG You may experience some temporary, minor bruising where the needle electrode was inserted into your muscle. This bruising should fade within several days. If it persists, contact your primary care doctor.

## 2020-04-28 NOTE — Progress Notes (Signed)
[facility] Neurology Division Clinic Note - Initial Visit   Date: 04/28/20  Nancy [name] MRN: [id number] DOB: 1985-09-29   Dear Dr. Rito Ehrlich:  Thank you for your kind referral of Nancy [name] for consultation of AIDP. Although her history is well known to you, please allow [place] to reiterate it for the purpose of our medical record. The patient was accompanied to the clinic by husband who also provides collateral information.     History of Present Illness: Nancy [name] is a 34 y.o. 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  In early August, she developed acute onset of left wrist drop and inability to move the hands.  She thought she had a stroke so went to the ER where MRI head was negative.  About two weeks later, she had sudden onset of bilateral leg weakness and numbness to the point where she was unable to walk.  She was admitted to [facility] from 9/4 - 9/15 and had extensive evaluation including MRI neuroaxis, labs, and CSF testing.  Spinal fluid analysis showed a bloody tap with > 11000 RBCs, markedly elevated protein 167, and elevated glucose 148, with normal WBC when corrected for RBCs.  Due to concern of AIDP, she was started on IVIG and completed 5 doses. She was discharged to rehab and return home last week where she will be started home PTI/OT.  Over the past two week, she has noticed improved strength in the right hand, such that she has movement of the fingers and able to extend the wrist a little.  Her legs continue to feel heavy and numbness, especially int he thighs.  She does not have numbness/tingling or weakness in the feet, lower legs, or right arms.  She is able to transfer, but unable to walk unassisted.  She arrives in wheelchair.   She was working from home for [employer].   Out-side paper records, electronic  medical record, and images have been reviewed where available and summarized as:  Lab Results  Component Value Date   HGBA1C 14.6 (H) 04/02/2020   No results found for: [id number] Lab Results  Component Value Date   TSH 1.187 04/13/2015   No results found for: ESRSEDRATE, POCTSEDRATE  Past Medical History:  Diagnosis Date  . Anxiety   . Bipolar disorder (HCC)   . CHF (congestive heart failure) (HCC)   . CIN I (cervical intraepithelial neoplasia I)   . Diabetes mellitus without complication (HCC)   . Enlarged heart   . Nephrolithiasis   . OSA (obstructive sleep apnea)   . Tachycardia     Past Surgical History:  Procedure Laterality Date  . BLADDER AUGMENTATION  2001   enlargement  . DILITATION & CURRETTAGE/HYSTROSCOPY WITH THERMACHOICE ABLATION N/A 07/06/2013   Procedure: DILATATION & CURETTAGE/HYSTEROSCOPY WITH THERMACHOICE ABLATION;  Surgeon: Tilda Burrow, MD;  Location: [facility];  Service: Gynecology;  Laterality: N/A;  total therapt time- 9 minutes 2 seconds; 87C  . LAPAROSCOPIC BILATERAL SALPINGECTOMY Bilateral 07/06/2013   Procedure: LAPAROSCOPIC BILATERAL SALPINGECTOMY;  Surgeon: Tilda Burrow, MD;  Location: [facility];  Service: Gynecology;  Laterality: Bilateral;     Medications:  Outpatient Encounter Medications as of 04/28/2020  Medication Sig  . cyclobenzaprine (FLEXERIL) 10 MG tablet Take 10 mg by mouth in the morning, at noon, and at bedtime.   . diclofenac Sodium (VOLTAREN) 1 % GEL Apply 2-8 g topically See admin instructions. Apply 2-8  grams to each leg nightly  . docusate sodium (COLACE) 100 MG capsule Take 1 capsule (100 mg total) by mouth 2 (two) times daily.  Marland Kitchen EPINEPHrine 0.3 mg/0.3 mL IJ SOAJ injection Inject 0.3 mg into the muscle as needed for anaphylaxis.   Marland Kitchen etodolac (LODINE XL) 600 MG 24 hr tablet Take 600 mg by mouth daily at 12 noon.   . insulin aspart (NOVOLOG) 100 UNIT/ML injection Inject 12 Units into the skin 3 (three) times daily with meals.   . insulin glargine (LANTUS) 100 UNIT/ML injection Inject 0.55 mLs (55 Units total) into the skin at bedtime.  . lidocaine (LIDODERM) 5 % Place 1 patch onto the skin daily. Remove & Discard patch within 12 hours or as directed by MD  . lisinopril (ZESTRIL) 2.5 MG tablet Take 2.5 mg by mouth daily.  . Oxycodone HCl 20 MG TABS Take 10 mg by mouth in the morning, at noon, and at bedtime. 10mg  q4  . polyethylene glycol (MIRALAX / GLYCOLAX) 17 g packet Take 17 g by mouth daily as needed for mild constipation.  . pregabalin (LYRICA) 300 MG capsule Take 300 mg by mouth 2 (two) times daily.  topiramate (TOPAMAX) 50 MG tablet Take 50 mg by mouth in the morning.  . chlorthalidone (HYGROTON) 25 MG tablet Take 25 mg by mouth See admin instructions. Take 25 mg by mouth two times a day as long as the Systolic number is 140 or greater (Patient not taking: Reported on 04/28/2020)  . furosemide (LASIX) 40 MG tablet Take 0.5 tablets (20 mg total) by mouth in the morning. (Patient not taking: Reported on 04/28/2020)  . glipiZIDE (GLUCOTROL) 5 MG tablet Take 5 mg by mouth 2 (two) times daily before a meal.  (Patient not taking: Reported on 04/28/2020)  . nicotine (NICODERM CQ - DOSED IN MG/24 HOURS) 21 mg/24hr patch Place 1 patch (21 mg total) onto the skin daily. (Patient not taking: Reported on 04/28/2020)  . nicotine polacrilex (NICORETTE) 2 MG gum Take 1 each (2 mg total) by mouth as needed for smoking cessation. (Patient not taking: Reported on 04/28/2020)  . potassium chloride SA (KLOR-CON M20) 20 MEQ tablet Take 20 mEq by mouth daily. (Patient not taking: Reported on 04/28/2020)   No facility-administered encounter medications on file as of 04/28/2020.    Allergies:  Allergies  Allergen Reactions  . Asa Buff (Mag [Buffered Aspirin] Anaphylaxis, Shortness Of Breath, Swelling and Other (See Comments)    "Made my throat become swollen to the point of closing"  . Aspirin Anaphylaxis, Shortness Of Breath, Swelling  and Other (See Comments)    "Made my throat become swollen to the point of closing"  . Tolmetin Shortness Of Breath and Other (See Comments)    TOLECTIN (tolmetin sodium) is indicated for the relief of signs and symptoms of rheumatoid arthritis and osteoarthritis  . Prozac [Fluoxetine Hcl] Other (See Comments)    Reaction not recalled    Family History: Family History  Problem Relation Age of Onset  . Hypertension Mother   . Depression Mother   . Diabetes Mother   . Cancer Mother        brain tumor  . Hypertension Father   . Diabetes Father   . Depression Father   . Hypertension Sister   . Diabetes Sister   . Depression Sister   . Depression Brother   . Diabetes Brother   . Hypertension Brother   . Cancer Maternal Aunt   . Cancer Paternal  Aunt   . Hypertension Maternal Grandmother   . Diabetes Maternal Grandmother   . Depression Maternal Grandmother   . Hypertension Maternal Grandfather   . Diabetes Maternal Grandfather   . Depression Maternal Grandfather   . Hypertension Paternal Grandmother   . Diabetes Paternal Grandmother   . Depression Paternal Grandmother   . Hypertension Paternal Grandfather   . Diabetes Paternal Grandfather   . Depression Paternal Grandfather     Social History: Social History   Tobacco Use  . Smoking status: Current Every Day Smoker    Packs/day: 1.50    Types: Cigarettes  . Smokeless tobacco: Never Used  Vaping Use  . Vaping Use: Never used  Substance Use Topics  . Alcohol use: Yes    Comment: occasionally  . Drug use: No   Social History   Social History Narrative   Left Handed   Two story home   Drinks caffeine     Vital Signs:  BP (!) 153/101   Pulse 99   Ht  (1.676 m)   Wt (!) 359 lb (162.8 kg)   SpO2 100%   BMI 57.94 kg/m   Neurological Exam: MENTAL STATUS including orientation to time, place, person, recent and remote memory, attention span and concentration, language, and fund of knowledge is normal.   Speech is not dysarthric.  CRANIAL NERVES: II:  No visual field defects.  Unremarkable fundi.   III-IV-VI: Pupils equal round and reactive to light.  Normal conjugate, extra-ocular eye movements in all directions of gaze.  No nystagmus.  No ptosis.   V:  Normal facial sensation.    VII:  Normal facial symmetry and movements.   VIII:  Normal hearing and vestibular function.   IX-X:  Normal palatal movement.   XI:  Normal shoulder shrug and head rotation.   XII:  Normal tongue strength and range of motion, no deviation or fasciculation.  MOTOR:  No atrophy, fasciculations or abnormal movements.  No pronator drift.   Upper Extremity:  Right  Left  Deltoid  5/5   5/5   Biceps  5/5   5/5   Triceps  5/5   5/5   Infraspinatus 5/5  5/5  Medial pectoralis 5/5  5/5  Wrist extensors  5/5   3/5   Wrist flexors  5/5   4/5   Finger extensors  5/5   3/5   Finger flexors  5/5   4/5   Dorsal interossei  5/5   3/5   Abductor pollicis  5/5   4/5   Tone (Ashworth scale)  0  0   Lower Extremity:  Right  Left  Hip flexors  4-/5   4-/5   Hip extensors  4/5   4/5   Adductor 4/5  4/5  Abductor 5/5  5/5  Knee flexors  5/5   5/5   Knee extensors  5/5   5/5   Dorsiflexors  5/5   5/5   Plantarflexors  5/5   5/5   Toe extensors  5/5   5/5   Toe flexors  5/5   5/5   Tone (Ashworth scale)  0  0   MSRs:  Right        Left                  brachioradialis 2+  2+  biceps 2+  2+  triceps 2+  2+  patellar 1+  2+  ankle jerk 0  0  Hoffman no  no  plantar response down  down   SENSORY:  Absent vibration at the ankles bilaterally and reduced on the left MCP.  Pin prick and temeprature is reduced over the left arm and hand, intact in the feet. Light touch reduced over the thighs.     COORDINATION/GAIT: Normal finger-to- nose-finger.  Intact rapid alternating movements bilaterally.  Gait not tested as patient in wheelchair  IMPRESSION: Diabetic polyradiculoneuropathy, severe.  With her markedly  elevated CSF glucose, symptoms are most consistent with diabetic polyradiculoneuropathy, as this would be normal with AIDP.  Recovery will take time and possibility incomplete with residual deficits.   - Management is targeted at working on diabetes control, this was stressed at length through medication but also lifestyle modification with diet and exercise  - Continue PT/OT  - NCS/EMG of the left arm and leg to assess severity  Tobacco abuse  - Counseled on tobacco cessation  Return to clinic in 3 months.  Total time spent reviewing records, interview, history/exam, documentation, and coordination of care on day of encounter:  60 min   Thank you for allowing me to participate in patient's care.  If I can answer any additional questions, I would be pleased to do so.    Sincerely,    Loisann Roach K. Allena Katz, DO

## 2020-05-05 ENCOUNTER — Telehealth: Payer: Self-pay | Admitting: Neurology

## 2020-05-05 NOTE — Telephone Encounter (Signed)
Please inform patient that we have added her to our cancellation list for EMG and to continue physical therapy.  We do not manage her pain, she is seeing pain management for this and would need to seek their advice.

## 2020-05-05 NOTE — Telephone Encounter (Signed)
Patient called with concerns her legs are getting worse. She said, "The pain and weakness have gotten so much worse then when I last saw Dr. Allena Katz." Patient wants to know the doctors recommendations.  Patient added to wait list for EMG/NCS.  [facility] [address] [name] (if needed)

## 2020-05-08 NOTE — Telephone Encounter (Signed)
Patient advised and verbally understood.

## 2020-05-10 ENCOUNTER — Other Ambulatory Visit: Payer: Self-pay

## 2020-05-10 ENCOUNTER — Ambulatory Visit (INDEPENDENT_AMBULATORY_CARE_PROVIDER_SITE_OTHER): Admitting: Neurology

## 2020-05-10 DIAGNOSIS — R29898 Other symptoms and signs involving the musculoskeletal system: Secondary | ICD-10-CM | POA: Diagnosis not present

## 2020-05-10 DIAGNOSIS — E1142 Type 2 diabetes mellitus with diabetic polyneuropathy: Secondary | ICD-10-CM | POA: Diagnosis not present

## 2020-05-10 NOTE — Addendum Note (Signed)
Addended by: Karl Luke A on: 05/10/2020 03:07 PM   Modules accepted: Orders

## 2020-05-10 NOTE — Procedures (Signed)
[facility]  [address] [place], [address]  [place], [place] [address] Tel: [phone] Fax:  [phone] Test Date:  05/10/2020  Patient: Nancy [name] DOB: 1985-11-25 Physician: Nita Sickle, DO  Sex: Female Height: 5\' 6"  Ref Phys: , DO  ID#: Nita Sickle Temp: 33.0C Technician:    Patient Complaints: This is a 34 year old female referred for evaluation of subacute onset of bilateral leg and left arm weakness suggestive of diabetic polyradiculoneuropathy.  NCV & EMG Findings: Extensive electrodiagnostic testing of the left upper and lower extremities is somewhat hampered by body physiognomy.  Findings are as follows: 1. Left median, ulnar, and radial sensory responses are within normal limits.  Left sural and superficial peroneal sensory responses are absent. 2. Left median and ulnar motor responses are within normal limits.  Left radial motor response shows reduced amplitude (3.2 mV).  Left peroneal (EDB) and tibial motor responses are absent.  Left peroneal motor response at the tibialis anterior shows reduced amplitude (2.2 mV). 3. Right ulnar F wave is absent. 4. Needle electrode examination shows a generalized pattern of incomplete motor unit activation as seen by variable motor unit recruitment, which may be due to pain, poor effort, or less likely central disorder of motor unit control.  Reduced recruitment is seen in the left radial innervated muscles, without accompanied active denervation.  Testing is performed too early to see chronic motor axonal loss changes.  There is no evidence of active denervation.   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     ___________________________ 36, DO    Nerve Conduction Studies Anti Sensory Summary Table   Stim Site NR Peak (ms) Norm Peak (ms) P-T Amp (V) Norm P-T Amp  Left Median Anti  Sensory (2nd Digit)  33C  Wrist    3.0 <3.4 47.9 >20  Left Radial Anti Sensory (Base 1st Digit)  33C  Wrist    2.2 <2.7 26.5 >18  Left Sup Peroneal Anti Sensory (Ant Lat Mall)  33C  12 cm NR  <4.5  >5  Left Sural Anti Sensory (Lat Mall)  33C  Calf NR  <4.5  >5  Left Ulnar Anti Sensory (5th Digit)  33C  Wrist    2.8 <3.1 21.0 >12   Motor Summary Table   Stim Site NR Onset (ms) Norm Onset (ms) O-P Amp (mV) Norm O-P Amp Site1 Site2 Delta-0 (ms) Dist (cm) Vel (m/s) Norm Vel (m/s)  Left Median Motor (Abd Poll Brev)  33C  Wrist    3.0 <3.9 12.6 >6 Elbow Wrist 5.8 29.0 50 >50  Elbow    8.8  11.5         Left Peroneal Motor (Ext Dig Brev)  33C  Ankle NR  <5.5  >3 B Fib Ankle  0.0  >40  B Fib NR     Poplt B Fib  0.0  >40  Poplt NR            Left Peroneal TA Motor (Tib Ant)  33C    Maximal stimulus  Fib Head    2.4 <4.0 2.2 >4 Poplit Fib Head 1.5 9.0 60 >40  Poplit    3.9  2.2         Left Radial Motor (Ext Ind Prop)  33C    Maximal stimulus  7cm    1.9 <3.1 3.2 >6 ACF 7cm 2.4 12.0 50 >50  ACF    4.3  2.4  B-spiral groove ACF 1.6 8.0 50   B-spiral groove    5.9  2.1  A-spiral groove B-spiral groove 0.4 3.0 75   A-spiral groove    6.3  1.4         Left Tibial Motor (Abd Hall Brev)  33C  Ankle NR  <6.0  >8 Knee Ankle  0.0  >40  Knee NR            Left Ulnar Motor (Abd Dig Minimi)  33C  Wrist    2.5 <3.1 12.8 >7 B Elbow Wrist 4.8 24.0 50 >50  B Elbow    7.3  11.4  A Elbow B Elbow 1.8 10.0 56 >50  A Elbow    9.1  11.4          F Wave Studies   NR F-Lat (ms) Lat Norm (ms) L-R F-Lat (ms)  Right Ulnar (Mrkrs) (Abd Dig Min)  33C  NR  <33    EMG   Side Muscle Ins Act Fibs Psw Fasc Number Recrt Dur Dur. Amp Amp. Poly Poly. Comment  Left AntTibialis Nml Nml Nml Nml 1- Mod-V Nml Nml Nml Nml Nml Nml N/A  Left 1stDorInt Nml Nml Nml Nml 1- Mod-V Nml Nml Nml Nml Nml Nml N/A  Left Ext Indicis Nml Nml Nml Nml 1- Mod-R Nml Nml Nml Nml Nml Nml N/A  Left PronatorTeres Nml Nml Nml Nml  1- Mod-V Nml Nml Nml Nml Nml Nml N/A  Left Biceps Nml Nml Nml Nml 1- Mod-V Nml Nml Nml Nml Nml Nml N/A  Left Triceps Nml Nml Nml Nml 1- Mod-V Nml Nml Nml Nml Nml Nml N/A  Left Brachioradialis Nml Nml Nml Nml 1- Mod-R Nml Nml Nml Nml Nml Nml N/A  Left ExtCarpiRadialis Nml Nml Nml Nml 1- Mod-R Nml Nml Nml Nml Nml Nml N/A  Left Deltoid Nml Nml Nml Nml Nml Nml Nml Nml Nml Nml Nml Nml N/A  Left Gastroc Nml Nml Nml Nml 2- Mod-V Nml Nml Nml Nml Nml Nml N/A  Left Flex Dig Long Nml Nml Nml Nml 2- Mod-V Nml Nml Nml Nml Nml Nml N/A  Left RectFemoris Nml Nml Nml Nml 1- Mod-V Nml Nml Nml Nml Nml Nml N/A  Left Lumbo Parasp Low Nml Nml Nml Nml Nml Nml Nml Nml Nml Nml Nml Nml N/A  Left GluteusMed Nml Nml Nml Nml 1- Mod-V Nml Nml Nml Nml Nml Nml N/A      Waveforms:

## 2020-05-10 NOTE — Addendum Note (Signed)
Addended by: Karl Luke A on: 05/10/2020 03:29 PM   Modules accepted: Orders

## 2020-05-10 NOTE — Progress Notes (Signed)
Follow-up Visit   Date: 05/10/20   Nancy [name] MRN: [id number] DOB: 08/01/1985   Interim History: Nancy [name] is a 34 y.o. female returning for EDX of the left arm and leg and discuss results.  She has not appreciated any significant improvement in her strength and continues to have weakness with extending the left wrist and bilateral legs.  She is having difficulty making it to out-patient PT, so is requesting home PT.  EDX of the left arm and leg was performed today, see below for results.   Medications:  Current Outpatient Medications on File Prior to Visit  Medication Sig Dispense Refill   chlorthalidone (HYGROTON) 25 MG tablet Take 25 mg by mouth See admin instructions. Take 25 mg by mouth two times a day as long as the Systolic number is 140 or greater (Patient not taking: Reported on 04/28/2020)     cyclobenzaprine (FLEXERIL) 10 MG tablet Take 10 mg by mouth in the morning, at noon, and at bedtime.      diclofenac Sodium (VOLTAREN) 1 % GEL Apply 2-8 g topically See admin instructions. Apply 2-8 grams to each leg nightly     docusate sodium (COLACE) 100 MG capsule Take 1 capsule (100 mg total) by mouth 2 (two) times daily. 10 capsule 0   EPINEPHrine 0.3 mg/0.3 mL IJ SOAJ injection Inject 0.3 mg into the muscle as needed for anaphylaxis.      etodolac (LODINE XL) 600 MG 24 hr tablet Take 600 mg by mouth daily at 12 noon.      furosemide (LASIX) 40 MG tablet Take 0.5 tablets (20 mg total) by mouth in the morning. (Patient not taking: Reported on 04/28/2020) 30 tablet    glipiZIDE (GLUCOTROL) 5 MG tablet Take 5 mg by mouth 2 (two) times daily before a meal.  (Patient not taking: Reported on 04/28/2020)     insulin aspart (NOVOLOG) 100 UNIT/ML injection Inject 12 Units into the skin 3 (three) times daily with meals. 10 mL 11   insulin glargine (LANTUS) 100 UNIT/ML injection Inject 0.55 mLs (55 Units total) into the skin at bedtime. 10 mL 11   lidocaine (LIDODERM) 5 %  Place 1 patch onto the skin daily. Remove & Discard patch within 12 hours or as directed by MD 30 patch 0   lisinopril (ZESTRIL) 2.5 MG tablet Take 2.5 mg by mouth daily.     nicotine (NICODERM CQ - DOSED IN MG/24 HOURS) 21 mg/24hr patch Place 1 patch (21 mg total) onto the skin daily. (Patient not taking: Reported on 04/28/2020) 28 patch 0   nicotine polacrilex (NICORETTE) 2 MG gum Take 1 each (2 mg total) by mouth as needed for smoking cessation. (Patient not taking: Reported on 04/28/2020) 100 tablet 0   Oxycodone HCl 20 MG TABS Take 10 mg by mouth in the morning, at noon, and at bedtime. 10mg  q4     polyethylene glycol (MIRALAX / GLYCOLAX) 17 g packet Take 17 g by mouth daily as needed for mild constipation. 14 each 0   potassium chloride SA (KLOR-CON M20) 20 MEQ tablet Take 20 mEq by mouth daily. (Patient not taking: Reported on 04/28/2020)     pregabalin (LYRICA) 300 MG capsule Take 300 mg by mouth 2 (two) times daily.     topiramate (TOPAMAX) 50 MG tablet Take 50 mg by mouth in the morning.     No current facility-administered medications on file prior to visit.    Allergies:  Allergies  Allergen Reactions  Asa Buff (Mag [Buffered Aspirin] Anaphylaxis, Shortness Of Breath, Swelling and Other (See Comments)    "Made my throat become swollen to the point of closing"   Aspirin Anaphylaxis, Shortness Of Breath, Swelling and Other (See Comments)    "Made my throat become swollen to the point of closing"   Tolmetin Shortness Of Breath and Other (See Comments)    TOLECTIN (tolmetin sodium) is indicated for the relief of signs and symptoms of rheumatoid arthritis and osteoarthritis   Prozac [Fluoxetine Hcl] Other (See Comments)    Reaction not recalled    Neurological Exam: Deferred  Data: NCS/EMG of the left arm and leg 05/10/2020: The electrophysiologic findings are 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     IMPRESSION/PLAN: Diabetic polyradiculoneuropathy, confirmed by CSF and EDX  - Testing shows isolated radial nerve involvement in the left arm and diffusely low NCS amplitudes which may be due to body habitus, especially as needle electrode was relatively better.  It is early to see chronic changes and she may need repeat testing in 6-12 months, if there is no improvement.   - Again, stressed the importance of optimizing diabetes management  - refer to home PT/OT for hand and leg strengthening  Total time spent reviewing records, interview, history/exam, documentation, and coordination of care on day of encounter:  20 min   Thank you for allowing me to participate in patient's care.  If I can answer any additional questions, I would be pleased to do so.    Sincerely,    Anjanae Woehrle K. Allena Katz, DO

## 2020-06-06 ENCOUNTER — Encounter: Admitting: Neurology

## 2020-09-08 ENCOUNTER — Encounter: Payer: Self-pay | Admitting: Neurology

## 2020-09-08 ENCOUNTER — Ambulatory Visit: Admitting: Neurology

## 2020-09-08 DIAGNOSIS — Z029 Encounter for administrative examinations, unspecified: Secondary | ICD-10-CM

## 2020-10-02 ENCOUNTER — Emergency Department (HOSPITAL_COMMUNITY)

## 2020-10-02 ENCOUNTER — Other Ambulatory Visit: Payer: Self-pay

## 2020-10-02 ENCOUNTER — Inpatient Hospital Stay (HOSPITAL_COMMUNITY)
Admission: EM | Admit: 2020-10-02 | Discharge: 2020-10-04 | DRG: 603 | Disposition: A | Discharge status: Home / Self Care | Attending: Family Medicine | Admitting: Family Medicine

## 2020-10-02 DIAGNOSIS — G8929 Other chronic pain: Secondary | ICD-10-CM

## 2020-10-02 DIAGNOSIS — Z20822 Contact with and (suspected) exposure to covid-19: Secondary | ICD-10-CM | POA: Diagnosis present

## 2020-10-02 DIAGNOSIS — L816 Other disorders of diminished melanin formation: Secondary | ICD-10-CM | POA: Diagnosis present

## 2020-10-02 DIAGNOSIS — Z833 Family history of diabetes mellitus: Secondary | ICD-10-CM | POA: Diagnosis not present

## 2020-10-02 DIAGNOSIS — E1169 Type 2 diabetes mellitus with other specified complication: Secondary | ICD-10-CM | POA: Diagnosis not present

## 2020-10-02 DIAGNOSIS — E1140 Type 2 diabetes mellitus with diabetic neuropathy, unspecified: Secondary | ICD-10-CM | POA: Diagnosis not present

## 2020-10-02 DIAGNOSIS — L03818 Cellulitis of other sites: Secondary | ICD-10-CM | POA: Diagnosis not present

## 2020-10-02 DIAGNOSIS — F17210 Nicotine dependence, cigarettes, uncomplicated: Secondary | ICD-10-CM | POA: Diagnosis present

## 2020-10-02 DIAGNOSIS — Z8249 Family history of ischemic heart disease and other diseases of the circulatory system: Secondary | ICD-10-CM

## 2020-10-02 DIAGNOSIS — E781 Pure hyperglyceridemia: Secondary | ICD-10-CM | POA: Diagnosis present

## 2020-10-02 DIAGNOSIS — L03314 Cellulitis of groin: Principal | ICD-10-CM | POA: Diagnosis present

## 2020-10-02 DIAGNOSIS — Z794 Long term (current) use of insulin: Secondary | ICD-10-CM | POA: Diagnosis not present

## 2020-10-02 DIAGNOSIS — E109 Type 1 diabetes mellitus without complications: Secondary | ICD-10-CM | POA: Diagnosis present

## 2020-10-02 DIAGNOSIS — E785 Hyperlipidemia, unspecified: Secondary | ICD-10-CM | POA: Diagnosis present

## 2020-10-02 DIAGNOSIS — R509 Fever, unspecified: Secondary | ICD-10-CM | POA: Diagnosis not present

## 2020-10-02 DIAGNOSIS — Z66 Do not resuscitate: Secondary | ICD-10-CM | POA: Diagnosis present

## 2020-10-02 DIAGNOSIS — Z79899 Other long term (current) drug therapy: Secondary | ICD-10-CM

## 2020-10-02 DIAGNOSIS — L03317 Cellulitis of buttock: Secondary | ICD-10-CM

## 2020-10-02 DIAGNOSIS — G610 Guillain-Barre syndrome: Secondary | ICD-10-CM | POA: Diagnosis present

## 2020-10-02 DIAGNOSIS — I110 Hypertensive heart disease with heart failure: Secondary | ICD-10-CM | POA: Diagnosis present

## 2020-10-02 DIAGNOSIS — G43A0 Cyclical vomiting, not intractable: Secondary | ICD-10-CM | POA: Diagnosis present

## 2020-10-02 DIAGNOSIS — I509 Heart failure, unspecified: Secondary | ICD-10-CM | POA: Diagnosis present

## 2020-10-02 DIAGNOSIS — G4733 Obstructive sleep apnea (adult) (pediatric): Secondary | ICD-10-CM | POA: Diagnosis present

## 2020-10-02 DIAGNOSIS — L0390 Cellulitis, unspecified: Secondary | ICD-10-CM | POA: Diagnosis present

## 2020-10-02 DIAGNOSIS — L03116 Cellulitis of left lower limb: Secondary | ICD-10-CM | POA: Diagnosis not present

## 2020-10-02 DIAGNOSIS — I10 Essential (primary) hypertension: Secondary | ICD-10-CM | POA: Diagnosis not present

## 2020-10-02 DIAGNOSIS — F419 Anxiety disorder, unspecified: Secondary | ICD-10-CM | POA: Diagnosis present

## 2020-10-02 DIAGNOSIS — Z888 Allergy status to other drugs, medicaments and biological substances status: Secondary | ICD-10-CM | POA: Diagnosis not present

## 2020-10-02 DIAGNOSIS — Z6841 Body Mass Index (BMI) 40.0 and over, adult: Secondary | ICD-10-CM

## 2020-10-02 LAB — COMPREHENSIVE METABOLIC PANEL
ALT: 15 U/L (ref 0–44)
AST: 20 U/L (ref 15–41)
Albumin: 3.4 g/dL — ABNORMAL LOW (ref 3.5–5.0)
Alkaline Phosphatase: 98 U/L (ref 38–126)
Anion gap: 13 (ref 5–15)
BUN: 7 mg/dL (ref 6–20)
CO2: 23 mmol/L (ref 22–32)
Calcium: 9.2 mg/dL (ref 8.9–10.3)
Chloride: 97 mmol/L — ABNORMAL LOW (ref 98–111)
Creatinine, Ser: 0.59 mg/dL (ref 0.44–1.00)
GFR, Estimated: >60 mL/min (ref >60)
Glucose, Bld: 437 mg/dL — ABNORMAL HIGH (ref 70–99)
Potassium: 4.2 mmol/L (ref 3.5–5.1)
Sodium: 133 mmol/L — ABNORMAL LOW (ref 135–145)
Total Bilirubin: 0.6 mg/dL (ref 0.3–1.2)
Total Protein: 7.5 g/dL (ref 6.5–8.1)

## 2020-10-02 LAB — CBC WITH DIFFERENTIAL/PLATELET
Basophils Absolute: 0 10*3/uL (ref 0.0–0.1)
Basophils Relative: 0 %
Eosinophils Absolute: 0.2 10*3/uL (ref 0.0–0.5)
Eosinophils Relative: 2 %
HCT: 43.4 % (ref 36.0–46.0)
Hemoglobin: 14.6 g/dL (ref 12.0–15.0)
Immature Granulocytes: 0 %
Lymphocytes Relative: 45 %
Lymphs Abs: 3.6 10*3/uL (ref 0.7–4.0)
MCH: 29 pg (ref 26.0–34.0)
MCHC: 33.6 g/dL (ref 30.0–36.0)
MCV: 86.1 fL (ref 80.0–100.0)
Monocytes Absolute: 0.5 10*3/uL (ref 0.1–1.0)
Monocytes Relative: 6 %
Neutro Abs: 3.8 10*3/uL (ref 1.7–7.7)
Neutrophils Relative %: 46 %
Platelets: 252 10*3/uL (ref 150–400)
RBC: 5.04 MIL/uL (ref 3.87–5.11)
RDW: 12.8 % (ref 11.5–15.5)
WBC: 8.1 10*3/uL (ref 4.0–10.5)
nRBC: 0 % (ref 0.0–0.2)

## 2020-10-02 LAB — GLUCOSE, CAPILLARY
Glucose-Capillary: 342 mg/dL — ABNORMAL HIGH (ref 70–99)
Glucose-Capillary: 353 mg/dL — ABNORMAL HIGH (ref 70–99)
Glucose-Capillary: 428 mg/dL — ABNORMAL HIGH (ref 70–99)
Glucose-Capillary: 477 mg/dL — ABNORMAL HIGH (ref 70–99)

## 2020-10-02 LAB — LACTIC ACID, PLASMA
Lactic Acid, Venous: 3.1 mmol/L (ref 0.5–1.9)
Lactic Acid, Venous: 3.2 mmol/L (ref 0.5–1.9)
Lactic Acid, Venous: 2.2 mmol/L (ref 0.5–1.9)

## 2020-10-02 LAB — HEMOGLOBIN A1C
Hgb A1c MFr Bld: 11.8 % — ABNORMAL HIGH (ref 4.8–5.6)
Mean Plasma Glucose: 291.96 mg/dL

## 2020-10-02 LAB — RESP PANEL BY RT-PCR (FLU A&B, COVID) ARPGX2
Influenza A by PCR: NEGATIVE
Influenza B by PCR: NEGATIVE
SARS Coronavirus 2 by RT PCR: NEGATIVE

## 2020-10-02 IMAGING — CT CT ABD-PELV W/ CM
2 of 4 series · 14 of 46 positions shown, 16 images · IV contrast (omnipaque)
Comparison: [DATE]
COMPARISON: [DATE]

Addendum:
CLINICAL DATA: Abdominal pain

EXAM:
CT ABDOMEN AND PELVIS WITH CONTRAST
TECHNIQUE: Multidetector CT imaging of the abdomen and pelvis was performed
using the standard protocol following bolus administration of
intravenous contrast.
CONTRAST:  100mL OMNIPAQUE IOHEXOL 300 MG/ML  SOLN

[Series 3: a/p w/ 5mm · axial · 0.98mm/px · z∈[-449,-44]mm · 11 of 99 slices shown, 13 images]
[im 9/99  soft-tissue]
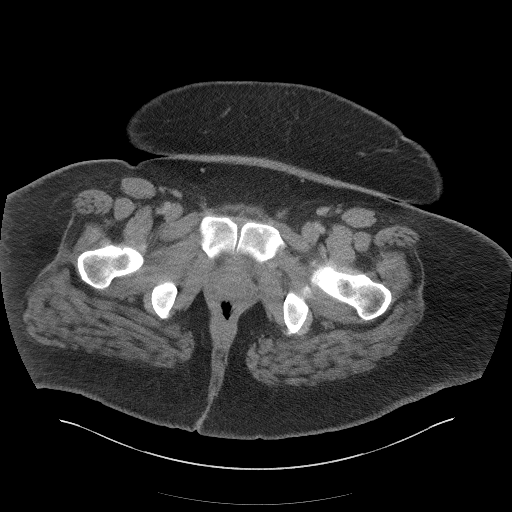
[im 9/99  bone]
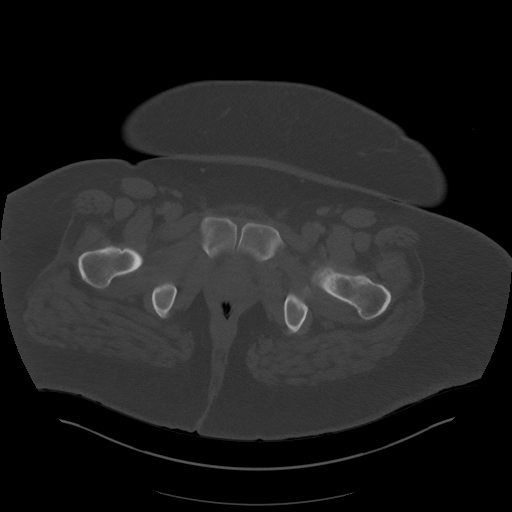
[im 17/99  soft-tissue]
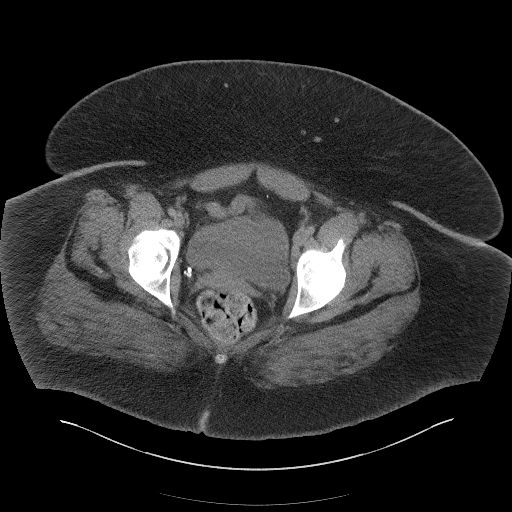
[im 25/99  soft-tissue]
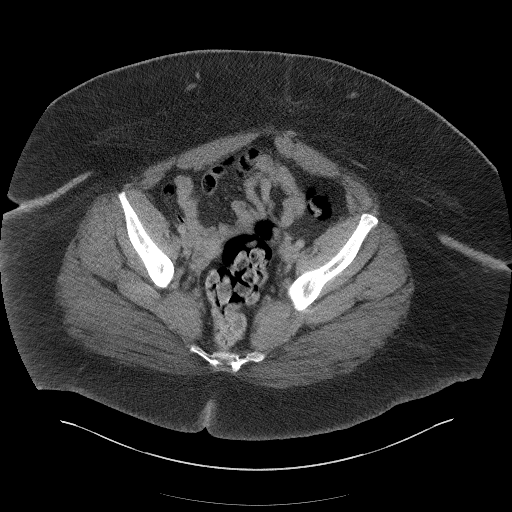
[im 33/99  soft-tissue]
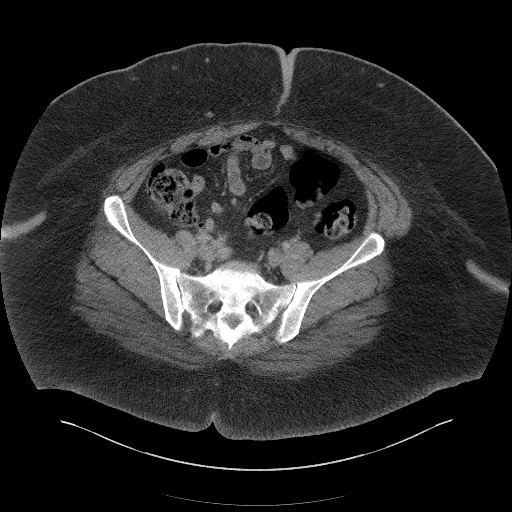
[im 41/99  soft-tissue]
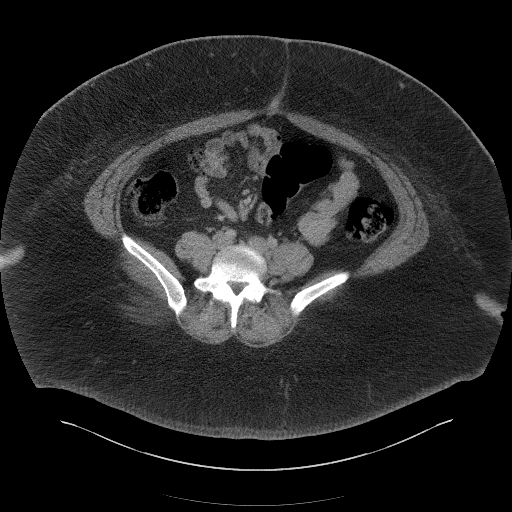
[im 50/99  soft-tissue]
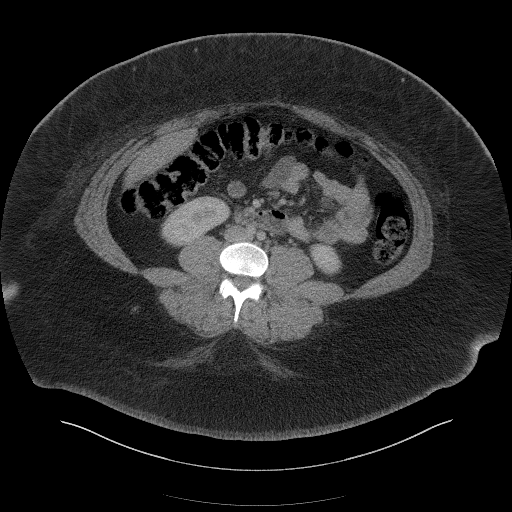
[im 58/99  soft-tissue]
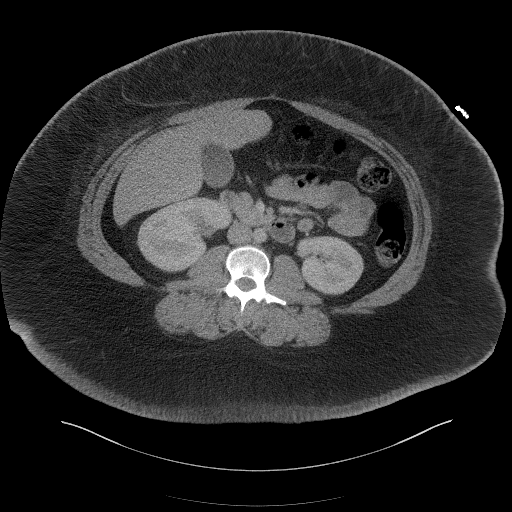
[im 66/99  soft-tissue]
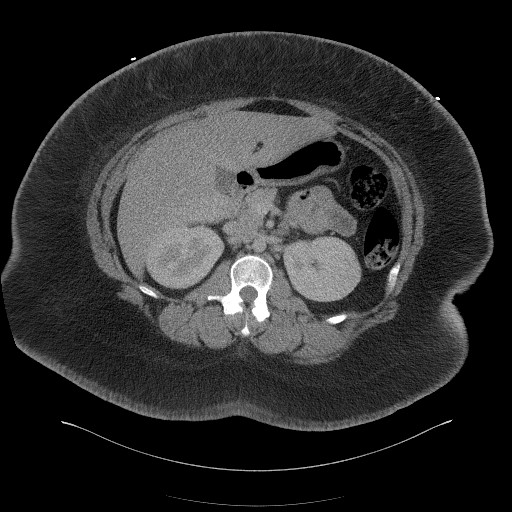
[im 74/99  soft-tissue]
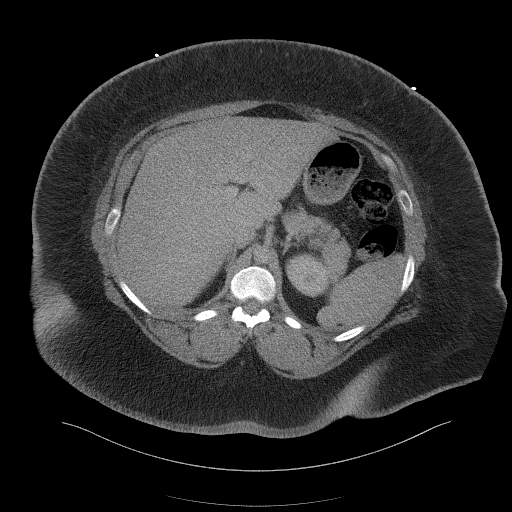
[im 74/99  bone]
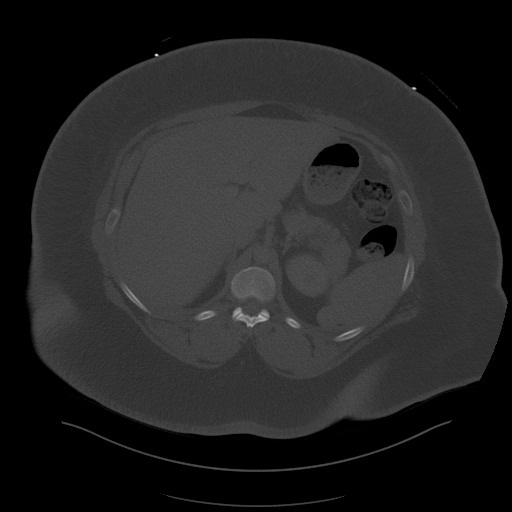
[im 82/99  soft-tissue]
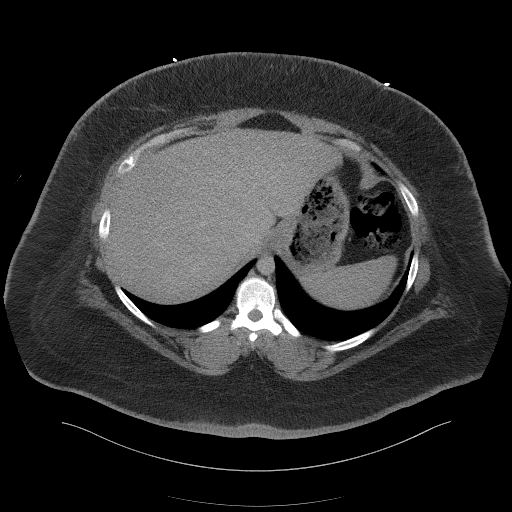
[im 90/99  soft-tissue]
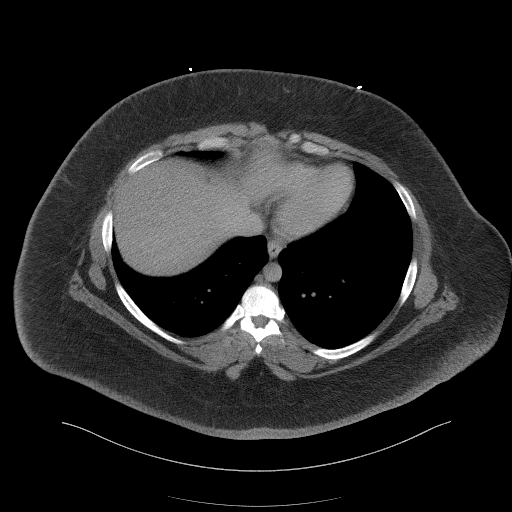

[Series 6: a/p w/ cor · coronal · 0.97mm/px · 3 of 200 slices shown]
[im 67/200  soft-tissue]
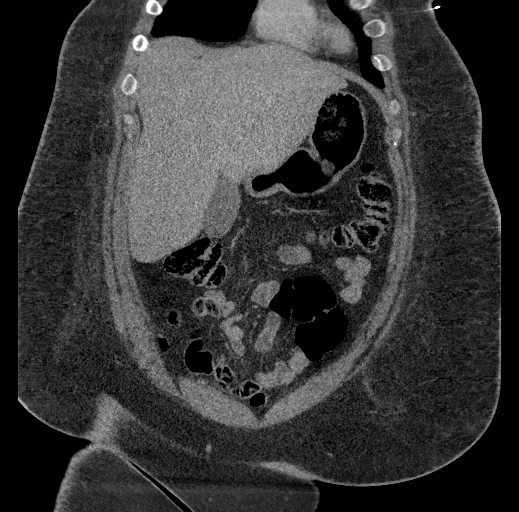
[im 89/200  soft-tissue]
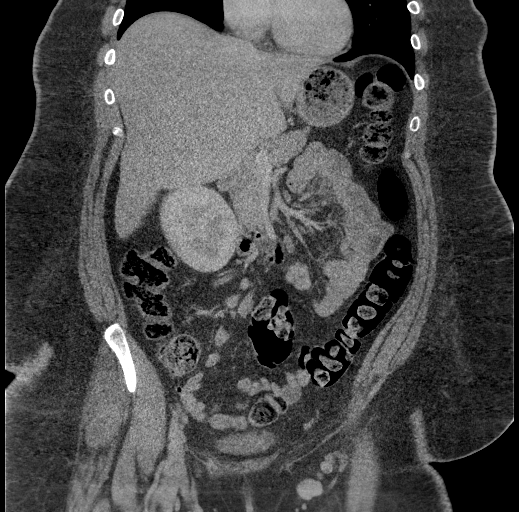
[im 111/200  soft-tissue]
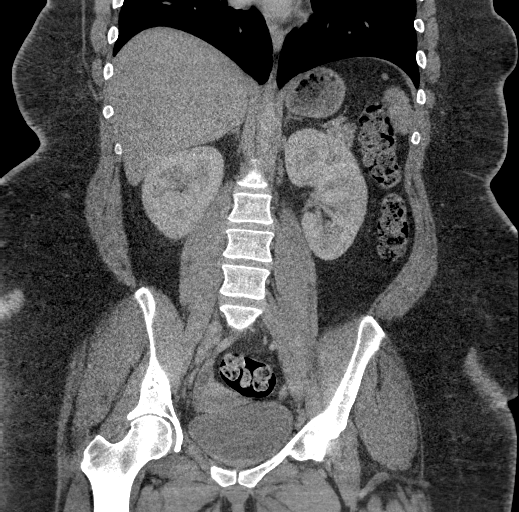

[14 of 46 positions shown; findings below may reference images not displayed]

FINDINGS: Lower chest: Lung bases are clear.

Hepatobiliary: Liver measures 22.8 cm in length. No focal liver
lesions are appreciable. Gallbladder wall is not appreciably
thickened. There is no biliary duct dilatation.

Pancreas: There is no pancreatic mass or inflammatory focus.

Spleen: No splenic lesions are evident.

Adrenals/Urinary Tract: Adrenals bilaterally appear normal. No
evident renal mass or hydronephrosis on either side. There is no
evident renal or ureteral calculus on either side. Urinary bladder
is midline with wall thickness within normal limits.

Stomach/Bowel: Moderate stool present throughout colon. There is no
appreciable bowel wall or mesenteric thickening. Terminal ileum
appears normal. No evident bowel obstruction. There is no
appreciable free air or portal venous air.

Vascular/Lymphatic: No abdominal aortic aneurysm. No arterial
vascular lesions are evident. Major venous structures appear patent.
There is no evident adenopathy in the abdomen or pelvis based on
size criteria. Several subcentimeter mesenteric lymph nodes are
noted in the right abdomen.

Reproductive: Uterus is anteverted.  No evident adnexal mass.

Other: Appendix appears normal. No demonstrable abscess or ascites
evident in the abdomen or pelvis.

Musculoskeletal: No blastic or lytic bone lesions. No intramuscular
or abdominal wall lesions are evident.
IMPRESSION: 1.  Prominent liver without focal liver lesion evident.

2. No bowel wall thickening or bowel obstruction. No abscess in the
abdomen or pelvis. Appendix appears normal.

3. Subcentimeter mesenteric lymph nodes on the right which are
considered nonspecific. In the appropriate clinical setting, lymph
nodes of this nature potentially may be indicative of a degree of
mesenteric adenitis.

4. No evident renal or ureteral calculus. No hydronephrosis. Urinary
bladder wall thickness normal.

ADDENDUM:
Comment: Note that there is a mildly prominent lymph node in the
left inguinal region with a short axis diameter of 1.1 cm. Other
inguinal lymph nodes are subcentimeter. There is no associated fluid
or inflammation in the left inguinal region appreciable.

*** End of Addendum ***
FINDINGS: Lower chest: Lung bases are clear.

Hepatobiliary: Liver measures 22.8 cm in length. No focal liver
lesions are appreciable. Gallbladder wall is not appreciably
thickened. There is no biliary duct dilatation.

Pancreas: There is no pancreatic mass or inflammatory focus.

Spleen: No splenic lesions are evident.

Adrenals/Urinary Tract: Adrenals bilaterally appear normal. No
evident renal mass or hydronephrosis on either side. There is no
evident renal or ureteral calculus on either side. Urinary bladder
is midline with wall thickness within normal limits.

Stomach/Bowel: Moderate stool present throughout colon. There is no
appreciable bowel wall or mesenteric thickening. Terminal ileum
appears normal. No evident bowel obstruction. There is no
appreciable free air or portal venous air.

Vascular/Lymphatic: No abdominal aortic aneurysm. No arterial
vascular lesions are evident. Major venous structures appear patent.
There is no evident adenopathy in the abdomen or pelvis based on
size criteria. Several subcentimeter mesenteric lymph nodes are
noted in the right abdomen.

Reproductive: Uterus is anteverted.  No evident adnexal mass.

Other: Appendix appears normal. No demonstrable abscess or ascites
evident in the abdomen or pelvis.

Musculoskeletal: No blastic or lytic bone lesions. No intramuscular
or abdominal wall lesions are evident.
IMPRESSION: 1.  Prominent liver without focal liver lesion evident.

2. No bowel wall thickening or bowel obstruction. No abscess in the
abdomen or pelvis. Appendix appears normal.

3. Subcentimeter mesenteric lymph nodes on the right which are
considered nonspecific. In the appropriate clinical setting, lymph
nodes of this nature potentially may be indicative of a degree of
mesenteric adenitis.

4. No evident renal or ureteral calculus. No hydronephrosis. Urinary
bladder wall thickness normal.

## 2020-10-02 MED ORDER — INSULIN ASPART 100 UNIT/ML ~~LOC~~ SOLN
10.0000 [IU] | Freq: Once | SUBCUTANEOUS | Status: AC
  Administered 2020-10-02: 10 [IU] via INTRAVENOUS

## 2020-10-02 MED ORDER — PREGABALIN 100 MG PO CAPS
300.0000 mg | ORAL_CAPSULE | Freq: Two times a day (BID) | ORAL | Status: DC
Start: 2020-10-02 — End: 2020-10-04
  Administered 2020-10-02 – 2020-10-04 (×4): 300 mg via ORAL
  Filled 2020-10-02 (×4): qty 3

## 2020-10-02 MED ORDER — ONDANSETRON HCL 4 MG/2ML IJ SOLN
4.0000 mg | Freq: Once | INTRAMUSCULAR | Status: AC
  Administered 2020-10-02: 4 mg via INTRAVENOUS
  Filled 2020-10-02: qty 2

## 2020-10-02 MED ORDER — ETODOLAC 300 MG PO CAPS
300.0000 mg | ORAL_CAPSULE | Freq: Every day | ORAL | Status: DC
  Administered 2020-10-02 – 2020-10-03 (×2): 300 mg via ORAL
  Filled 2020-10-02 (×2): qty 1

## 2020-10-02 MED ORDER — SODIUM CHLORIDE 0.9 % IV SOLN: INTRAVENOUS | Status: DC

## 2020-10-02 MED ORDER — POLYETHYLENE GLYCOL 3350 17 G PO PACK
17.0000 g | PACK | Freq: Every day | ORAL | Status: DC | PRN
  Administered 2020-10-03: 17 g via ORAL
  Filled 2020-10-02: qty 1

## 2020-10-02 MED ORDER — INSULIN ASPART 100 UNIT/ML ~~LOC~~ SOLN
0.0000 [IU] | Freq: Three times a day (TID) | SUBCUTANEOUS | Status: DC
  Administered 2020-10-02 – 2020-10-03 (×2): 20 [IU] via SUBCUTANEOUS
  Administered 2020-10-03: 11 [IU] via SUBCUTANEOUS
  Administered 2020-10-03: 20 [IU] via SUBCUTANEOUS
  Administered 2020-10-04: 15 [IU] via SUBCUTANEOUS
  Administered 2020-10-04: 11 [IU] via SUBCUTANEOUS

## 2020-10-02 MED ORDER — TOPIRAMATE 25 MG PO TABS
50.0000 mg | ORAL_TABLET | Freq: Every morning | ORAL | Status: DC
Start: 2020-10-03 — End: 2020-10-04
  Administered 2020-10-03 – 2020-10-04 (×2): 50 mg via ORAL
  Filled 2020-10-02 (×2): qty 2

## 2020-10-02 MED ORDER — ENOXAPARIN SODIUM 40 MG/0.4ML ~~LOC~~ SOLN
40.0000 mg | SUBCUTANEOUS | Status: DC
  Administered 2020-10-02: 40 mg via SUBCUTANEOUS
  Filled 2020-10-02: qty 0.4

## 2020-10-02 MED ORDER — INSULIN ASPART 100 UNIT/ML ~~LOC~~ SOLN
0.0000 [IU] | Freq: Every day | SUBCUTANEOUS | Status: DC
  Administered 2020-10-02 – 2020-10-03 (×2): 5 [IU] via SUBCUTANEOUS

## 2020-10-02 MED ORDER — CEFAZOLIN SODIUM-DEXTROSE 2-4 GM/100ML-% IV SOLN
2.0000 g | Freq: Once | INTRAVENOUS | Status: AC
  Administered 2020-10-02: 2 g via INTRAVENOUS
  Filled 2020-10-02 (×2): qty 100

## 2020-10-02 MED ORDER — NICOTINE 14 MG/24HR TD PT24
14.0000 mg | MEDICATED_PATCH | Freq: Once | TRANSDERMAL | Status: AC
  Administered 2020-10-02: 14 mg via TRANSDERMAL
  Filled 2020-10-02: qty 1

## 2020-10-02 MED ORDER — NICOTINE 21 MG/24HR TD PT24
21.0000 mg | MEDICATED_PATCH | Freq: Every day | TRANSDERMAL | Status: DC
  Administered 2020-10-03 – 2020-10-04 (×2): 21 mg via TRANSDERMAL
  Filled 2020-10-02 (×2): qty 1

## 2020-10-02 MED ORDER — OXYCODONE HCL 5 MG PO TABS
10.0000 mg | ORAL_TABLET | ORAL | Status: DC | PRN
  Administered 2020-10-02 – 2020-10-04 (×10): 10 mg via ORAL
  Filled 2020-10-02 (×10): qty 2

## 2020-10-02 MED ORDER — CEFAZOLIN SODIUM-DEXTROSE 2-4 GM/100ML-% IV SOLN
2.0000 g | Freq: Three times a day (TID) | INTRAVENOUS | Status: DC
  Administered 2020-10-03 (×3): 2 g via INTRAVENOUS
  Filled 2020-10-02 (×5): qty 100

## 2020-10-02 MED ORDER — ACETAMINOPHEN 325 MG PO TABS
650.0000 mg | ORAL_TABLET | Freq: Four times a day (QID) | ORAL | Status: DC | PRN
  Administered 2020-10-03 – 2020-10-04 (×2): 650 mg via ORAL
  Filled 2020-10-02 (×2): qty 2

## 2020-10-02 MED ORDER — MORPHINE SULFATE (PF) 4 MG/ML IV SOLN
4.0000 mg | Freq: Once | INTRAVENOUS | Status: AC
Start: 2020-10-02 — End: 2020-10-02
  Administered 2020-10-02: 4 mg via INTRAVENOUS
  Filled 2020-10-02: qty 1

## 2020-10-02 MED ORDER — INSULIN GLARGINE 100 UNIT/ML ~~LOC~~ SOLN
30.0000 [IU] | Freq: Every day | SUBCUTANEOUS | Status: DC
  Administered 2020-10-02: 30 [IU] via SUBCUTANEOUS
  Filled 2020-10-02 (×2): qty 0.3

## 2020-10-02 MED ORDER — IOHEXOL 300 MG/ML  SOLN
100.0000 mL | Freq: Once | INTRAMUSCULAR | Status: AC | PRN
  Administered 2020-10-02: 100 mL via INTRAVENOUS

## 2020-10-02 MED ORDER — ACETAMINOPHEN 650 MG RE SUPP: 650.0000 mg | Freq: Four times a day (QID) | RECTAL | Status: DC | PRN

## 2020-10-02 MED ORDER — SODIUM CHLORIDE 0.9 % IV BOLUS
1000.0000 mL | Freq: Once | INTRAVENOUS | Status: AC
  Administered 2020-10-02: 1000 mL via INTRAVENOUS

## 2020-10-02 MED ORDER — HYDROMORPHONE HCL 1 MG/ML IJ SOLN
1.0000 mg | Freq: Once | INTRAMUSCULAR | Status: AC
  Administered 2020-10-02: 1 mg via INTRAVENOUS
  Filled 2020-10-02: qty 1

## 2020-10-02 MED ORDER — INSULIN GLARGINE 100 UNIT/ML ~~LOC~~ SOLN
25.0000 [IU] | Freq: Every day | SUBCUTANEOUS | Status: DC
  Filled 2020-10-02: qty 0.25

## 2020-10-02 NOTE — ED Provider Notes (Cosign Needed Addendum)
[facility] EMERGENCY DEPARTMENT Provider Note   CSN: [id number] Arrival date & time: 10/02/20  7425     History Chief Complaint  Patient presents with  . Abscess    Nancy [name] is a 35 y.o. female with past medical history of anxiety, bipolar, diabetes, CHF, OSA that presents the emergency department today for an abscess.  Patient states that she is an abscess of the left groin for the past week, started developing nausea and vomiting today.  States that it started small, however has now extended up into her left buttocks, and has started radiating pain to her left abdomen.  Denies any vaginal bleeding or vaginal pain.  Denies any pain with bowel movements, no hematochezia.  Denies any fevers.  Denies any recent antibiotics. No drainage.  Remote abscess at this area multiple years ago, states that she is normally able to pop them herself, however the swelling has persisted.  Patient states that she started having nausea, persistent vomiting this morning.  States that she thinks this is from the pain, did take a oxycodone for this and pain did slightly improve.  Patient states that her blood sugars have been running high for the past couple months, missed her recent endocrinology appointment.  Denies any back pain, chest pain or shortness of breath.  Denies any recent sick contacts.  No other complaints at this time. HPI     Past Medical History:  Diagnosis Date  . Anxiety   . Bipolar disorder (HCC)   . CHF (congestive heart failure) (HCC)   . CIN I (cervical intraepithelial neoplasia I)   . Diabetes mellitus without complication (HCC)   . Enlarged heart   . Nephrolithiasis   . OSA (obstructive sleep apnea)   . Tachycardia     Patient Active Problem List   Diagnosis Date Noted  . 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 CHF (congestive heart failure) (HCC)   . Diabetes mellitus without complication (HCC)   . HTN  (hypertension)   . Hyponatremia   . Weakness 04/01/2020  . Bipolar affective disorder, depressed, severe (HCC) 08/27/2016  . Opiate abuse, episodic (HCC) 04/09/2015  . MDD (major depressive disorder), recurrent episode, severe (HCC) 04/09/2015  . Suicide attempt by other tranquilizer drug overdose (HCC) 04/09/2015  . 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 Dysmenorrhea 07/06/2013  . Dysmenorrhea 06/09/2013  . Menorrhagia with regular cycle 06/09/2013  . Leg edema 10/03/2011  . Edema leg 10/03/2011  . Bipolar I disorder, most recent episode depressed (HCC) 09/26/2011  . Morbid obesity (HCC) 09/26/2011  . Weakness of both legs 09/04/2011  . Difficulty in walking(719.7) 09/04/2011  . 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 BLADDER AUGMENTATION  2001   enlargement  . DILITATION & CURRETTAGE/HYSTROSCOPY WITH THERMACHOICE ABLATION N/A 07/06/2013   Procedure: DILATATION & CURETTAGE/HYSTEROSCOPY WITH THERMACHOICE ABLATION;  Surgeon: Tilda Burrow, MD;  Location: [facility];  Service: Gynecology;  Laterality: N/A;  total therapt time- 9 minutes 2 seconds; 87C  . LAPAROSCOPIC BILATERAL SALPINGECTOMY Bilateral 07/06/2013   Procedure: LAPAROSCOPIC BILATERAL SALPINGECTOMY;  Surgeon: Tilda Burrow, MD;  Location: [facility];  Service: Gynecology;  Laterality: Bilateral;     OB History   No obstetric history on file.     Family History  Problem Relation Age of Onset  . Hypertension Mother   . Depression Mother   . Diabetes Mother   .  Cancer Mother        brain tumor  . Hypertension Father   . Diabetes Father   . Depression Father   . Hypertension Sister   . Diabetes Sister   . Depression Sister   . Depression Brother   . Diabetes Brother   . Hypertension Brother   . Cancer Maternal Aunt   . Cancer Paternal Aunt   . Hypertension Maternal Grandmother   . Diabetes Maternal Grandmother   . Depression Maternal  Grandmother   . Hypertension Maternal Grandfather   . Diabetes Maternal Grandfather   . Depression Maternal Grandfather   . Hypertension Paternal Grandmother   . Diabetes Paternal Grandmother   . Depression Paternal Grandmother   . Hypertension Paternal Grandfather   . Diabetes Paternal Grandfather   . Depression Paternal Grandfather     Social History   Tobacco Use  . Smoking status: Current Every Day Smoker    Packs/day: 1.50    Types: Cigarettes  . Smokeless tobacco: Never Used  Vaping Use  . Vaping Use: Never used  Substance Use Topics  . Alcohol use: Yes    Comment: occasionally  . Drug use: No    Home Medications Prior to Admission medications   Medication Sig Start Date End Date Taking? Authorizing Provider  chlorthalidone (HYGROTON) 25 MG tablet Take 25 mg by mouth See admin instructions. Take 25 mg by mouth two times a day as long as the Systolic number is 140 or greater Patient not taking: Reported on 04/28/2020 11/19/19   [provider]  cyclobenzaprine (FLEXERIL) 10 MG tablet Take 10 mg by mouth in the morning, at noon, and at bedtime.     [provider]  diclofenac Sodium (VOLTAREN) 1 % GEL Apply 2-8 g topically See admin instructions. Apply 2-8 grams to each leg nightly    [provider]  docusate sodium (COLACE) 100 MG capsule Take 1 capsule (100 mg total) by mouth 2 (two) times daily. 04/12/20   Osvaldo Shipper, MD  EPINEPHrine 0.3 mg/0.3 mL IJ SOAJ injection Inject 0.3 mg into the muscle as needed for anaphylaxis.     [provider]  etodolac (LODINE XL) 600 MG 24 hr tablet Take 600 mg by mouth daily at 12 noon.  02/23/20   [provider]  furosemide (LASIX) 40 MG tablet Take 0.5 tablets (20 mg total) by mouth in the morning. Patient not taking: Reported on 04/28/2020 04/12/20   Osvaldo Shipper, MD  glipiZIDE (GLUCOTROL) 5 MG tablet Take 5 mg by mouth 2 (two) times daily before a meal.  Patient not taking: Reported  on 04/28/2020 12/01/19   [provider]  insulin aspart (NOVOLOG) 100 UNIT/ML injection Inject 12 Units into the skin 3 (three) times daily with meals. 04/12/20   Osvaldo Shipper, MD  insulin glargine (LANTUS) 100 UNIT/ML injection Inject 0.55 mLs (55 Units total) into the skin at bedtime. 04/12/20   Osvaldo Shipper, MD  lidocaine (LIDODERM) 5 % Place 1 patch onto the skin daily. Remove & Discard patch within 12 hours or as directed by MD 04/12/20   Osvaldo Shipper, MD  lisinopril (ZESTRIL) 2.5 MG tablet Take 2.5 mg by mouth daily. 12/01/19   [provider]  nicotine (NICODERM CQ - DOSED IN MG/24 HOURS) 21 mg/24hr patch Place 1 patch (21 mg total) onto the skin daily. Patient not taking: Reported on 04/28/2020 04/13/20   Osvaldo Shipper, MD  nicotine polacrilex (NICORETTE) 2 MG gum Take 1 each (2 mg total)  by mouth as needed for smoking cessation. Patient not taking: Reported on 04/28/2020 04/12/20   Osvaldo Shipper, MD  Oxycodone HCl 20 MG TABS Take 10 mg by mouth in the morning, at noon, and at bedtime. 10mg  q4 03/22/20   [provider]  polyethylene glycol (MIRALAX / GLYCOLAX) 17 g packet Take 17 g by mouth daily as needed for mild constipation. 04/12/20   04/14/20, MD  potassium chloride SA (KLOR-CON M20) 20 MEQ tablet Take 20 mEq by mouth daily. Patient not taking: Reported on 04/28/2020    [provider]  pregabalin (LYRICA) 300 MG capsule Take 300 mg by mouth 2 (two) times daily. 02/24/20   [provider]  topiramate (TOPAMAX) 50 MG tablet Take 50 mg by mouth in the morning.    [provider]    Allergies    Asa buff (mag [buffered aspirin], Aspirin, Tolmetin, and Prozac [fluoxetine hcl]  Review of Systems   Review of Systems  Constitutional: Negative for chills, diaphoresis, fatigue and fever.  HENT: Negative for congestion, sore throat and trouble swallowing.   Eyes: Negative for pain and visual disturbance.  Respiratory: Negative  for cough, shortness of breath and wheezing.   Cardiovascular: Negative for chest pain, palpitations and leg swelling.  Gastrointestinal: Positive for nausea and vomiting. Negative for abdominal distention, abdominal pain and diarrhea.  Genitourinary: Negative for difficulty urinating.  Musculoskeletal: Negative for back pain, neck pain and neck stiffness.  Skin: Positive for color change. Negative for pallor.  Neurological: Negative for dizziness, speech difficulty, weakness and headaches.  Psychiatric/Behavioral: Negative for confusion.    Physical Exam Updated Vital Signs BP 124/82   Pulse (!) 125   Temp 98.9 F (37.2 C) (Oral)   Resp 11   SpO2 97%   Physical Exam Constitutional:      General: She is not in acute distress.    Appearance: Normal appearance. She is not ill-appearing, toxic-appearing or diaphoretic.  HENT:     Mouth/Throat:     Mouth: Mucous membranes are moist.     Pharynx: Oropharynx is clear.  Eyes:     General: No scleral icterus.    Extraocular Movements: Extraocular movements intact.     Pupils: Pupils are equal, round, and reactive to light.  Cardiovascular:     Rate and Rhythm: Normal rate and regular rhythm.     Pulses: Normal pulses.     Heart sounds: Normal heart sounds.  Pulmonary:     Effort: Pulmonary effort is normal. No respiratory distress.     Breath sounds: Normal breath sounds. No stridor. No wheezing, rhonchi or rales.  Chest:     Chest wall: No tenderness.  Abdominal:     General: Abdomen is flat. There is no distension.     Palpations: Abdomen is soft.     Tenderness: There is no abdominal tenderness. There is no guarding or rebound.  Genitourinary:   Musculoskeletal:        General: No swelling or tenderness. Normal range of motion.     Cervical back: Normal range of motion and neck supple. No rigidity.     Right lower leg: No edema.     Left lower leg: No edema.  Skin:    General: Skin is warm and dry.     Capillary  Refill: Capillary refill takes less than 2 seconds.     Coloration: Skin is not pale.     Comments: Patient with 4 inch area of induration and warmth to  left buttocks area as depicted above.  Does not extend into the vagina.  No drainage, no area of fluctuance.  Neurological:     General: No focal deficit present.     Mental Status: She is alert and oriented to person, place, and time.  Psychiatric:        Mood and Affect: Mood normal.        Behavior: Behavior normal.     ED Results / Procedures / Treatments   Labs (all labs ordered are listed, but only abnormal results are displayed) Labs Reviewed  COMPREHENSIVE METABOLIC PANEL - Abnormal; Notable for the following components:      Result Value   Sodium 133 (*)    Chloride 97 (*)    Glucose, Bld 437 (*)    Albumin 3.4 (*)    All other components within normal limits  LACTIC ACID, PLASMA - Abnormal; Notable for the following components:   Lactic Acid, Venous 3.1 (*)    All other components within normal limits  RESP PANEL BY RT-PCR (FLU A&B, COVID) ARPGX2  CULTURE, BLOOD (ROUTINE X 2)  CULTURE, BLOOD (ROUTINE X 2)  CBC WITH DIFFERENTIAL/PLATELET  LACTIC ACID, PLASMA    EKG EKG Interpretation  Date/Time:  Monday October 02 2020 09:39:33 EST Ventricular Rate:  128 PR Interval:    QRS Duration: 76 QT Interval:  317 QTC Calculation: 463 R Axis:   47 Text Interpretation: Sinus tachycardia LAE, consider biatrial enlargement Borderline repolarization abnormality Confirmed by Lorre Nick ([id number]) on 10/02/2020 11:00:53 AM   Radiology CT Abdomen Pelvis W Contrast  Addendum Date: 10/02/2020   ADDENDUM REPORT: 10/02/2020 12:24 ADDENDUM: Comment: Note that there is a mildly prominent lymph node in the left inguinal region with a short axis diameter of 1.1 cm. Other inguinal lymph nodes are subcentimeter. There is no associated fluid or inflammation in the left inguinal region appreciable. Electronically Signed   By: Bretta Bang III M.D.   On: 10/02/2020 12:24   Result Date: 10/02/2020 CLINICAL DATA:  Abdominal pain EXAM: CT ABDOMEN AND PELVIS WITH CONTRAST TECHNIQUE: Multidetector CT imaging of the abdomen and pelvis was performed using the standard protocol following bolus administration of intravenous contrast. CONTRAST:  OMNIPAQUE IOHEXOL 300 MG/ML  SOLN COMPARISON:  November 10, 2017 FINDINGS: Lower chest: Lung bases are clear. Hepatobiliary: Liver measures 22.8 cm in length. No focal liver lesions are appreciable. Gallbladder wall is not appreciably thickened. There is no biliary duct dilatation. Pancreas: There is no pancreatic mass or inflammatory focus. Spleen: No splenic lesions are evident. Adrenals/Urinary Tract: Adrenals bilaterally appear normal. No evident renal mass or hydronephrosis on either side. There is no evident renal or ureteral calculus on either side. Urinary bladder is midline with wall thickness within normal limits. Stomach/Bowel: Moderate stool present throughout colon. There is no appreciable bowel wall or mesenteric thickening. Terminal ileum appears normal. No evident bowel obstruction. There is no appreciable free air or portal venous air. Vascular/Lymphatic: No abdominal aortic aneurysm. No arterial vascular lesions are evident. Major venous structures appear patent. There is no evident adenopathy in the abdomen or pelvis based on size criteria. Several subcentimeter mesenteric lymph nodes are noted in the right abdomen. Reproductive: Uterus is anteverted.  No evident adnexal mass. Other: Appendix appears normal. No demonstrable abscess or ascites evident in the abdomen or pelvis. Musculoskeletal: No blastic or lytic bone lesions. No intramuscular or abdominal wall lesions are evident. IMPRESSION: 1.  Prominent liver without focal liver lesion evident. 2. No  bowel wall thickening or bowel obstruction. No abscess in the abdomen or pelvis. Appendix appears normal. 3. Subcentimeter mesenteric  lymph nodes on the right which are considered nonspecific. In the appropriate clinical setting, lymph nodes of this nature potentially may be indicative of a degree of mesenteric adenitis. 4. No evident renal or ureteral calculus. No hydronephrosis. Urinary bladder wall thickness normal. Electronically Signed: By: Bretta BangWilliam  Woodruff III M.D. On: 10/02/2020 11:59    Procedures Procedures   Medications Ordered in ED Medications  ceFAZolin (ANCEF) IVPB 2g/100 mL premix (has no administration in time range)  nicotine (NICODERM CQ - dosed in mg/24 hours) patch 14 mg (14 mg Transdermal Patch Applied 10/02/20 1432)  nicotine (NICODERM CQ - dosed in mg/24 hours) patch 21 mg (has no administration in time range)  sodium chloride 0.9 % bolus 1,000 mL (0 mLs Intravenous Stopped 10/02/20 1505)  ondansetron (ZOFRAN) injection 4 mg (4 mg Intravenous Given 10/02/20 0940)  HYDROmorphone (DILAUDID) injection 1 mg (1 mg Intravenous Given 10/02/20 0940)  insulin aspart (novoLOG) injection 10 Units (10 Units Intravenous Given 10/02/20 1217)  iohexol (OMNIPAQUE) 300 MG/ML solution 100 mL (100 mLs Intravenous Contrast Given 10/02/20 1147)  sodium chloride 0.9 % bolus 1,000 mL (1,000 mLs Intravenous New Bag/Given 10/02/20 1236)  ondansetron (ZOFRAN) injection 4 mg (4 mg Intravenous Given 10/02/20 1236)  morphine 4 MG/ML injection 4 mg (4 mg Intravenous Given 10/02/20 1236)    ED Course  I have reviewed the triage vital signs and the nursing notes.  Pertinent labs & imaging results that were available during my care of the patient were reviewed by me and considered in my medical decision making (see chart for details).    MDM Rules/Calculators/A&P                           Nancy [name] is a 35 y.o. female with past medical history of anxiety, bipolar, diabetes, CHF, OSA that presents the emergency department today for an abscess in left buttocks.  Abscess is large and indurated, about 4 cm, concern for deeper space abscess.   Also concern the patient is in DKA with persistent nausea vomiting, high sugars and tachycardia to 132.  Will obtain basic labs and CT imaging.  Pain controlled with Dilaudid, fluids and Zofran given.  Upon reevaluation, patient remains to be tachycardiac with IV fluids, does not appear to be in DKA.  CT scan does show a large lymph node in that area, no concerns for abscess.  This is most likely cellulitis with persistent tachycardia of 130, patient continues to feel nauseous with vomiting at home.  Patient is diabetic, patient would benefit from admission and IV antibiotics at this time due to worsening cellulitis and tachycardia.  Patient does not appear septic with normal white count, however will obtain cultures and lactic acid and start IV antibiotics.  She has received 2 L of fluid.  Spoke to family medicine residents who will admit patient.  Dr. Jennette KettleNeal is the admitting attending.  The patient appears reasonably stabilized for admission considering the current resources, flow, and capabilities available in the ED at this time, and [facility] and/or treatment in the ED prior to admission.  I discussed this case with my attending physician who cosigned this note including patient's presenting symptoms, physical exam, and planned diagnostics and interventions. Attending physician stated agreement with plan or made changes to plan which were implemented.   Attending  physician assessed patient at bedside.  Final Clinical Impression(s) / ED Diagnoses Final diagnoses:  Cellulitis of buttock    Rx / DC Orders ED Discharge Orders    None          Farrel Gordon, PA-C 10/02/20 1523    Lorre Nick, MD 10/04/20 1020

## 2020-10-02 NOTE — Progress Notes (Signed)
Pt blood sugar 428 MD aware 30 units lantus given as per MD orders will recheck in a hour will continue to monitor

## 2020-10-02 NOTE — Progress Notes (Signed)
Went to patient's room to discuss medications with patient as nurse reported she had refused her Lantus dose and wanted to discuss her pain meds.    Diabetic meds - Discussed with patient that in the hospital we decreased the home Lantus by about half (30U) and titrate up as needed daily, patient was agreeable to this as she has been hospitalized before.   - Will use NovoLog to address further hyperglycemia per protocol checks.  The plan at this time is to give her nighttime Lantus early and check in 1 hour to reassess sugars and address NovoLog at that time.  Pain medications - Flexeril is not added on at this time as patient is currently on a total of 70 mg oxycodone daily and we will assess pain as needed. - Her home oxycodone is offered as every 4 hours as needed, patient is aware this is not scheduled.  She normally takes her doses during the day with taking 2 at night before bed.  As patient will likely be woken up during the night, we discussed attempting the every 4 hours regimen and reassessing tomorrow if this needs adjusting to her home schedule. - All medications also include etodolac, which she takes 600 mg daily.  This was previously not added on, patient was requesting it.  This is a moderate dose of the NSAID and there are concerns with persistent use.  At this time we will add it back on and will discuss further with the team during daytime rounding.  Meriem Lemieux, DO

## 2020-10-02 NOTE — H&P (Addendum)
Family Medicine Teaching [facility] Admission History and Physical Service Pager: [phone]  Patient name: Nancy [name] Medical record number: [id number] Date of birth: November 02, 1985 Age: [age] y.o. Gender: female  Primary Care Provider: Courtney ParisMcCoy, Rachel, NP Consultants: None  Code Status: DNR Preferred Emergency Contact: Dwana CurdMatthew Schnebly, husband, [phone]  Chief Complaint: pain in left thigh  Assessment and Plan: Nancy RoseMinnie Deutschman is a 35 y.o. female presenting with cellulitis of left thigh. PMH is significant for DMT2, chronic back pain, morbid obesity, tobacco use   Cellulitis 35 yo female patient presenting with concern for cellulitis x8 days.  Reports she has an "abscess" on her left groin area that has been increasing in size over the past week. CT shows no drainable pocket, but enlarged left inguinal LN of 1.1 cm. Patient tachycardic with HR of 125. Afebrile. Labs notable for LA elevated at 3.1. Normal WBC. In ED patient was started on Ancef and was given 1L NS bolus. On exam patient has a firm and enlarged LN in left inguinal canal as well as approx 1 cm hypopigmented lesion on L groin area, and induration to rectal wall. Lymphadenopathy likely due to infection from lesion but also consider trauma, although no recall of inciting injury. Diabetes also likely contributing to infection.  Admitting patient for IV antibiotics and close monitoring given her tachycardia.  - admit to med-tele, attending Dr. Jennette KettleNeal -Vitals per floor -F/U echo - cardiac monitoring  -Continue Ancef - f/u blood cultures -A.m. BMP, CBC, lipid panel  - mIVF NS 150 mL/h - Up with assistance - Carb modified diet - Lovenox for dvt ppx  DM2 Recent A1c 12% reported in note on care everywhere, unclear date of testing. Glucose on admission 437 and not likely to be in DKA due to normal anion gap and CO2.  Home medication: 20 novolog, 60U lantus qhs, and sliding scale - Lantus 25units at night and can titrate - rSSI -  Repeat BMP AM - Recheck A1c  Hypertension Bps have bene hypo to normotensive ranging from 101-143/68-82 . Last echo on 06/01/20 with EF 65-70% and mild concentric left ventricular hypertrophy. Home medication: Lisinopril 2.5 daily  - hold Lisinopril due to soft BPs  HLD Last documented LDL 89 on 08/29/16. Home medication: pravastatin  - confirm pravastatin dose as not on med rec and can re start   Bipolar 1  MDD Per chart review. Patient denies taking any medication for mood.  - Patient not interested in treatment    Migraine HA Home medication: Topamax 50 mg daily also used for weight loss - Con't Topamax   Diabetic polyradiculoneuropathy  Chronic pain  Patient endorses pain in her back, arms, and knees.  Reports taking 10 mg of oxycodone 7 times a day [facility]. Conf[place] and on PDMP. 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  - nicotine patch 21mg    FEN/GI: Carb modified Prophylaxis: Lovenox  Disposition:   History of Present Illness:  Nancy RoseMinnie Sox is a 35 y.o. female presenting with cellulitis of the right thigh.  Patient initially noted "an abscess" on February 27, when it was about the size of a quarter. She noticed it had gotten larger, but had not come to a head. She reports using  a cut potato and placing it on the area to help "draw it out." Then, this morning, she was woken up by having sharp pain in her thigh/groin and nausea/vomiting x2, unable to keep fluids down. She has sharp pain shooting down the leg from groin to ankle. She reports that she thinks the abscess has spread around her thigh and to her buttock. She has a history of palpitations and had noticed them last Wednesday (3/2) and on Sunday (3/6).   Review Of Systems: Per  HPI with the following additions:   Review of Systems  Constitutional: Negative for activity change, appetite change and chills.  HENT: Negative for congestion.   Respiratory: Negative for cough, shortness of breath and wheezing.   Gastrointestinal: Positive for abdominal pain, nausea and vomiting. Negative for constipation and diarrhea.  Genitourinary: Negative for difficulty urinating.  Skin: Positive for color change and wound.  Psychiatric/Behavioral: Negative for agitation and confusion.  All other systems reviewed and are negative.    Patient Active Problem List   Diagnosis Date Noted  . 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 CHF (congestive heart failure) (HCC)   . Diabetes mellitus without complication (HCC)   . HTN (hypertension)   . Hyponatremia   . Weakness 04/01/2020  . Bipolar affective disorder, depressed, severe (HCC) 08/27/2016  . Opiate abuse, episodic (HCC) 04/09/2015  . MDD (major depressive disorder), recurrent episode, severe (HCC) 04/09/2015  . Suicide attempt by other tranquilizer drug overdose (HCC) 04/09/2015  . 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 Dysmenorrhea 07/06/2013  . Dysmenorrhea 06/09/2013  . Menorrhagia with regular cycle 06/09/2013  . Leg edema 10/03/2011  . Edema leg 10/03/2011  . Bipolar I disorder, most recent episode depressed (HCC) 09/26/2011  . Morbid obesity (HCC) 09/26/2011  . Weakness of both legs 09/04/2011  . Difficulty in walking(719.7) 09/04/2011  . Paraparesis (HCC) 09/04/2011    Past Medical History: Past Medical History:  Diagnosis Date  . Anxiety   . Bipolar disorder (HCC)   . CHF (congestive heart failure) (HCC)   . CIN I (cervical intraepithelial neoplasia I)   . Diabetes mellitus without complication (HCC)   . Enlarged heart   . Nephrolithiasis   . OSA (obstructive sleep apnea)   . Tachycardia     Past Surgical History: Past  Surgical History:  Procedure Laterality Date  . BLADDER AUGMENTATION  2001   enlargement  . DILITATION & CURRETTAGE/HYSTROSCOPY WITH THERMACHOICE ABLATION N/A 07/06/2013   Procedure: DILATATION & CURETTAGE/HYSTEROSCOPY WITH THERMACHOICE ABLATION;  Surgeon: Tilda Burrow, MD;  Location: [facility];  Service: Gynecology;  Laterality: N/A;  total therapt time- 9 minutes 2 seconds; 87C  . LAPAROSCOPIC BILATERAL SALPINGECTOMY Bilateral 07/06/2013   Procedure: LAPAROSCOPIC BILATERAL SALPINGECTOMY;  Surgeon: Tilda Burrow, MD;  Location: [facility];  Service: Gynecology;  Laterality: Bilateral;    Social History: Social History   Tobacco Use  . Smoking status: Current Every Day Smoker    Packs/day: 1.50    Types: Cigarettes  . Smokeless tobacco: Never Used  Vaping Use  . Vaping Use: Never used  Substance Use Topics  . Alcohol use: Yes    Comment: occasionally  . Drug use: No     Family History: Family History  Problem Relation Age of Onset  . Hypertension Mother   . Depression Mother   . Diabetes Mother   . Cancer Mother        brain tumor  .  Hypertension Father   . Diabetes Father   . Depression Father   . Hypertension Sister   . Diabetes Sister   . Depression Sister   . Depression Brother   . Diabetes Brother   . Hypertension Brother   . Cancer Maternal Aunt   . Cancer Paternal Aunt   . Hypertension Maternal Grandmother   . Diabetes Maternal Grandmother   . Depression Maternal Grandmother   . Hypertension Maternal Grandfather   . Diabetes Maternal Grandfather   . Depression Maternal Grandfather   . Hypertension Paternal Grandmother   . Diabetes Paternal Grandmother   . Depression Paternal Grandmother   . Hypertension Paternal Grandfather   . Diabetes Paternal Grandfather   . Depression Paternal Grandfather     Allergies and Medications: Allergies  Allergen Reactions  . Asa Buff (Mag [Buffered Aspirin] Anaphylaxis, Shortness Of Breath, Swelling and Other (See  Comments)    "Made my throat become swollen to the point of closing"  . Aspirin Anaphylaxis, Shortness Of Breath, Swelling and Other (See Comments)    "Made my throat become swollen to the point of closing"  . Tolmetin Shortness Of Breath and Other (See Comments)    TOLECTIN (tolmetin sodium) is indicated for the relief of signs and symptoms of rheumatoid arthritis and osteoarthritis  . Prozac [Fluoxetine Hcl] Other (See Comments)    Reaction not recalled   No current facility-administered medications on file prior to encounter.   Current Outpatient Medications on File Prior to Encounter  Medication Sig Dispense Refill  . chlorthalidone (HYGROTON) 25 MG tablet Take 25 mg by mouth See admin instructions. Take 25 mg by mouth two times a day as long as the Systolic number is 140 or greater (Patient not taking: Reported on 04/28/2020)    . cyclobenzaprine (FLEXERIL) 10 MG tablet Take 10 mg by mouth in the morning, at noon, and at bedtime.     . diclofenac Sodium (VOLTAREN) 1 % GEL Apply 2-8 g topically See admin instructions. Apply 2-8 grams to each leg nightly    . docusate sodium (COLACE) 100 MG capsule Take 1 capsule (100 mg total) by mouth 2 (two) times daily. 10 capsule 0  . EPINEPHrine 0.3 mg/0.3 mL IJ SOAJ injection Inject 0.3 mg into the muscle as needed for anaphylaxis.     Marland Kitchen etodolac (LODINE XL) 600 MG 24 hr tablet Take 600 mg by mouth daily at 12 noon.     . furosemide (LASIX) 40 MG tablet Take 0.5 tablets (20 mg total) by mouth in the morning. (Patient not taking: Reported on 04/28/2020) 30 tablet   . glipiZIDE (GLUCOTROL) 5 MG tablet Take 5 mg by mouth 2 (two) times daily before a meal.  (Patient not taking: Reported on 04/28/2020)    . insulin aspart (NOVOLOG) 100 UNIT/ML injection Inject 12 Units into the skin 3 (three) times daily with meals. 10 mL 11  . insulin glargine (LANTUS) 100 UNIT/ML injection Inject 0.55 mLs (55 Units total) into the skin at bedtime. 10 mL 11  . lidocaine  (LIDODERM) 5 % Place 1 patch onto the skin daily. Remove & Discard patch within 12 hours or as directed by MD 30 patch 0  . lisinopril (ZESTRIL) 2.5 MG tablet Take 2.5 mg by mouth daily.    . nicotine (NICODERM CQ - DOSED IN MG/24 HOURS) 21 mg/24hr patch Place 1 patch (21 mg total) onto the skin daily. (Patient not taking: Reported on 04/28/2020) 28 patch 0  . nicotine polacrilex (NICORETTE) 2  MG gum Take 1 each (2 mg total) by mouth as needed for smoking cessation. (Patient not taking: Reported on 04/28/2020) 100 tablet 0  . Oxycodone HCl 20 MG TABS Take 10 mg by mouth in the morning, at noon, and at bedtime. 10mg  q4    . polyethylene glycol (MIRALAX / GLYCOLAX) 17 g packet Take 17 g by mouth daily as needed for mild constipation. 14 each 0  . potassium chloride SA (KLOR-CON M20) 20 MEQ tablet Take 20 mEq by mouth daily. (Patient not taking: Reported on 04/28/2020)    . pregabalin (LYRICA) 300 MG capsule Take 300 mg by mouth 2 (two) times daily.    06/28/2020 topiramate (TOPAMAX) 50 MG tablet Take 50 mg by mouth in the morning.      Objective: BP 124/82   Pulse (!) 125   Temp 98.9 F (37.2 C) (Oral)   Resp 11   SpO2 97%  Exam: General: age-appropriate, AAW, alert, NAD, obese Eyes: Normal conjunctiva, EOMI  Neck: supple, no JVD, normal ROM Cardiovascular: Tachycardic, regular rhythm, no murmurs Respiratory: CTAB. Normal WOB Gastrointestinal: abdomen soft, slightly tender to palpation in LLQ MSK: normal tone and strength  Derm: skin warm, dry. Mild non pitting, LE edema bilaterally. Small 1 cm hypopigmented lesion on L groin area with enlarged inguinal lymph node and induration tracking from inguinal canal to to rectal wall on left Neuro: alert and oriented. No focal deficits  Psych: reports normal mood, full affect, pressured speech   Labs and Imaging: CBC BMET  Recent Labs  Lab 10/02/20 0940  WBC 8.1  HGB 14.6  HCT 43.4  PLT 252   Recent Labs  Lab 10/02/20 0940  NA 133*  K 4.2  CL  97*  CO2 23  BUN 7  CREATININE 0.59  GLUCOSE 437*  CALCIUM 9.2     EKG: Sinus tachycardia   CT Abdomen Pelvis W Contrast  Addendum Date: 10/02/2020   ADDENDUM REPORT: 10/02/2020 12:24 ADDENDUM: Comment: Note that there is a mildly prominent lymph node in the left inguinal region with a short axis diameter of 1.1 cm. Other inguinal lymph nodes are subcentimeter. There is no associated fluid or inflammation in the left inguinal region appreciable. Electronically Signed   By: 12/02/2020 III M.D.   On: 10/02/2020 12:24   Result Date: 10/02/2020 CLINICAL DATA:  Abdominal pain EXAM: CT ABDOMEN AND PELVIS WITH CONTRAST TECHNIQUE: Multidetector CT imaging of the abdomen and pelvis was performed using the standard protocol following bolus administration of intravenous contrast. CONTRAST:  12/02/2020 OMNIPAQUE IOHEXOL 300 MG/ML  SOLN COMPARISON:  November 10, 2017 FINDINGS: Lower chest: Lung bases are clear. Hepatobiliary: Liver measures 22.8 cm in length. No focal liver lesions are appreciable. Gallbladder wall is not appreciably thickened. There is no biliary duct dilatation. Pancreas: There is no pancreatic mass or inflammatory focus. Spleen: No splenic lesions are evident. Adrenals/Urinary Tract: Adrenals bilaterally appear normal. No evident renal mass or hydronephrosis on either side. There is no evident renal or ureteral calculus on either side. Urinary bladder is midline with wall thickness within normal limits. Stomach/Bowel: Moderate stool present throughout colon. There is no appreciable bowel wall or mesenteric thickening. Terminal ileum appears normal. No evident bowel obstruction. There is no appreciable free air or portal venous air. Vascular/Lymphatic: No abdominal aortic aneurysm. No arterial vascular lesions are evident. Major venous structures appear patent. There is no evident adenopathy in the abdomen or pelvis based on size criteria. Several subcentimeter mesenteric lymph nodes are noted  in  the right abdomen. Reproductive: Uterus is anteverted.  No evident adnexal mass. Other: Appendix appears normal. No demonstrable abscess or ascites evident in the abdomen or pelvis. Musculoskeletal: No blastic or lytic bone lesions. No intramuscular or abdominal wall lesions are evident. IMPRESSION: 1.  Prominent liver without focal liver lesion evident. 2. No bowel wall thickening or bowel obstruction. No abscess in the abdomen or pelvis. Appendix appears normal. 3. Subcentimeter mesenteric lymph nodes on the right which are considered nonspecific. In the appropriate clinical setting, lymph nodes of this nature potentially may be indicative of a degree of mesenteric adenitis. 4. No evident renal or ureteral calculus. No hydronephrosis. Urinary bladder wall thickness normal. Electronically Signed: By: Bretta Bang III M.D. On: 10/02/2020 11:59    Cora Collum, DO 10/02/2020, 2:12 PM PGY-1, [facility] Family Medicine FPTS Intern pager: [phone], text pages welcome  FPTS Upper-Level Resident Addendum   I have independently interviewed and examined the patient. I have discussed the above with the original author and agree with their documentation. My edits for correction/addition/clarification are in pink. Please see also any attending notes.   Shirlean Mylar, M.D. PGY-2, [facility] Family [facility] 10/02/2020 6:00 PM  FPTS Service pager: [phone] (text pages welcome through [facility])

## 2020-10-02 NOTE — ED Provider Notes (Signed)
I provided a substantive portion of the care of this patient.  I personally performed the entirety of the medical decision making for this encounter.  EKG Interpretation  Date/Time:  Monday October 02 2020 09:39:33 EST Ventricular Rate:  128 PR Interval:    QRS Duration: 76 QT Interval:  317 QTC Calculation: 463 R Axis:   47 Text Interpretation: Sinus tachycardia LAE, consider biatrial enlargement Borderline repolarization abnormality Confirmed by Lorre Nick ([id number]) on 10/02/2020 11:72:62 AM  35 year old female presents with swelling to her left groin times a week.  CT scan shows no signs of abscess but does have findings concerning for cellulitis.  She is tachycardic here.  Will order blood cultures, lactate and start antibiotics admit to the hospital   Lorre Nick, MD 10/02/20 1229

## 2020-10-02 NOTE — Progress Notes (Signed)
Pt Blood sugar 477 FM paged awaiting call back.

## 2020-10-02 NOTE — ED Notes (Signed)
Dinner Tray Ordered @ 1700. 

## 2020-10-02 NOTE — ED Triage Notes (Signed)
Pt reports abscess to L groin x 1 week, started spreading around to buttocks yesterday. Today has developed n/v.

## 2020-10-03 ENCOUNTER — Inpatient Hospital Stay (HOSPITAL_COMMUNITY)

## 2020-10-03 DIAGNOSIS — I10 Essential (primary) hypertension: Secondary | ICD-10-CM | POA: Diagnosis not present

## 2020-10-03 DIAGNOSIS — I509 Heart failure, unspecified: Secondary | ICD-10-CM

## 2020-10-03 DIAGNOSIS — L03818 Cellulitis of other sites: Secondary | ICD-10-CM | POA: Diagnosis not present

## 2020-10-03 DIAGNOSIS — E1140 Type 2 diabetes mellitus with diabetic neuropathy, unspecified: Secondary | ICD-10-CM

## 2020-10-03 DIAGNOSIS — G8929 Other chronic pain: Secondary | ICD-10-CM | POA: Diagnosis not present

## 2020-10-03 DIAGNOSIS — E1169 Type 2 diabetes mellitus with other specified complication: Secondary | ICD-10-CM | POA: Diagnosis not present

## 2020-10-03 DIAGNOSIS — E785 Hyperlipidemia, unspecified: Secondary | ICD-10-CM

## 2020-10-03 LAB — BLOOD CULTURE ID PANEL (REFLEXED) - BCID2

## 2020-10-03 LAB — ECHOCARDIOGRAM COMPLETE
Area-P 1/2: 9.85 cm2
Height: 67 in
S' Lateral: 3.3 cm
Weight: 5520.32 oz

## 2020-10-03 LAB — BASIC METABOLIC PANEL
Anion gap: 11 (ref 5–15)
BUN: 10 mg/dL (ref 6–20)
CO2: 23 mmol/L (ref 22–32)
Calcium: 8.6 mg/dL — ABNORMAL LOW (ref 8.9–10.3)
Chloride: 98 mmol/L (ref 98–111)
Creatinine, Ser: 0.45 mg/dL (ref 0.44–1.00)
GFR, Estimated: >60 mL/min (ref >60)
Glucose, Bld: 343 mg/dL — ABNORMAL HIGH (ref 70–99)
Potassium: 4.2 mmol/L (ref 3.5–5.1)
Sodium: 132 mmol/L — ABNORMAL LOW (ref 135–145)

## 2020-10-03 LAB — CBC
HCT: 40.6 % (ref 36.0–46.0)
Hemoglobin: 13.4 g/dL (ref 12.0–15.0)
MCH: 28.5 pg (ref 26.0–34.0)
MCHC: 33 g/dL (ref 30.0–36.0)
MCV: 86.4 fL (ref 80.0–100.0)
Platelets: 235 10*3/uL (ref 150–400)
RBC: 4.7 MIL/uL (ref 3.87–5.11)
RDW: 12.7 % (ref 11.5–15.5)
WBC: 6.4 10*3/uL (ref 4.0–10.5)
nRBC: 0 % (ref 0.0–0.2)

## 2020-10-03 LAB — GLUCOSE, CAPILLARY
Glucose-Capillary: 352 mg/dL — ABNORMAL HIGH (ref 70–99)
Glucose-Capillary: 379 mg/dL — ABNORMAL HIGH (ref 70–99)
Glucose-Capillary: 299 mg/dL — ABNORMAL HIGH (ref 70–99)
Glucose-Capillary: 356 mg/dL — ABNORMAL HIGH (ref 70–99)

## 2020-10-03 LAB — RAPID URINE DRUG SCREEN, HOSP PERFORMED
Amphetamines: NOT DETECTED
Barbiturates: NOT DETECTED
Benzodiazepines: NOT DETECTED
Cocaine: NOT DETECTED
Opiates: POSITIVE — AB
Tetrahydrocannabinol: NOT DETECTED

## 2020-10-03 LAB — LACTIC ACID, PLASMA: Lactic Acid, Venous: 1.7 mmol/L (ref 0.5–1.9)

## 2020-10-03 LAB — TSH: TSH: 0.832 u[IU]/mL (ref 0.350–4.500)

## 2020-10-03 LAB — LDL CHOLESTEROL, DIRECT: Direct LDL: 35.7 mg/dL (ref 0–99)

## 2020-10-03 LAB — LIPID PANEL
Cholesterol: 116 mg/dL (ref 0–200)
HDL: 20 mg/dL — ABNORMAL LOW (ref >40)
LDL Cholesterol: UNDETERMINED mg/dL (ref 0–99)
Total CHOL/HDL Ratio: 5.8 RATIO
Triglycerides: 511 mg/dL — ABNORMAL HIGH (ref <150)
VLDL: UNDETERMINED mg/dL (ref 0–40)

## 2020-10-03 IMAGING — DX DG CHEST 1V PORT
1 series · 1 of 1 positions shown · non-contrast
Comparison: [DATE]

CLINICAL DATA: Fever

EXAM:
PORTABLE CHEST 1 VIEW

[chest ap]
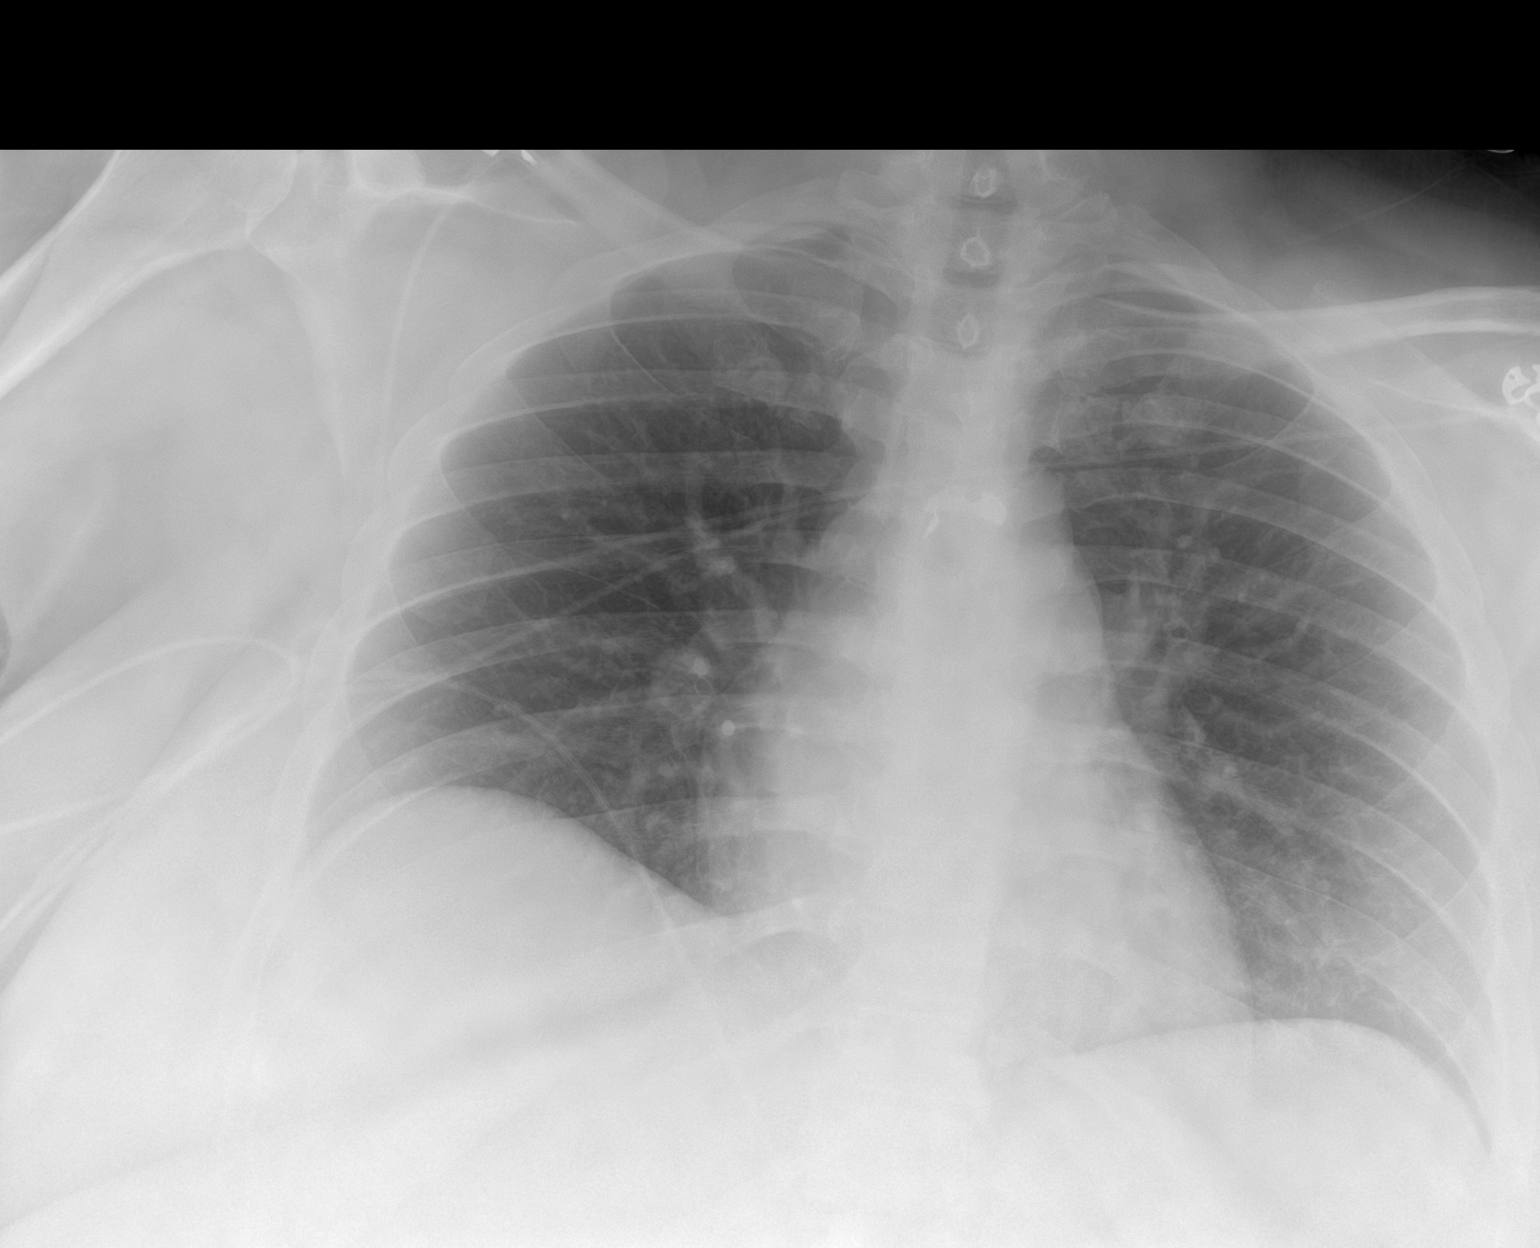

[1 of 1 positions shown; findings below may reference images not displayed]

FINDINGS: Cardiac shadows within normal limits. Mild vascular prominence is
noted centrally without edema. No focal infiltrate is seen. No bony
abnormality is noted.
IMPRESSION: Mild central vascular prominence without significant edema.

## 2020-10-03 MED ORDER — SODIUM CHLORIDE 0.9 % IV SOLN: INTRAVENOUS | Status: DC

## 2020-10-03 MED ORDER — PERFLUTREN LIPID MICROSPHERE
1.0000 mL | INTRAVENOUS | Status: AC | PRN
  Administered 2020-10-03: 2 mL via INTRAVENOUS
  Filled 2020-10-03: qty 10

## 2020-10-03 MED ORDER — ENOXAPARIN SODIUM 80 MG/0.8ML ~~LOC~~ SOLN
80.0000 mg | SUBCUTANEOUS | Status: DC
  Administered 2020-10-03: 80 mg via SUBCUTANEOUS
  Filled 2020-10-03: qty 0.8

## 2020-10-03 MED ORDER — VANCOMYCIN HCL 1750 MG/350ML IV SOLN
1750.0000 mg | Freq: Two times a day (BID) | INTRAVENOUS | Status: DC
  Administered 2020-10-04: 1750 mg via INTRAVENOUS
  Filled 2020-10-03: qty 350

## 2020-10-03 MED ORDER — SODIUM CHLORIDE 0.9 % IV BOLUS: 500.0000 mL | Freq: Once | INTRAVENOUS | Status: AC

## 2020-10-03 MED ORDER — SODIUM CHLORIDE 0.9 % IV BOLUS
500.0000 mL | Freq: Once | INTRAVENOUS | Status: AC
  Administered 2020-10-03: 500 mL via INTRAVENOUS

## 2020-10-03 MED ORDER — INSULIN GLARGINE 100 UNIT/ML ~~LOC~~ SOLN
40.0000 [IU] | Freq: Every day | SUBCUTANEOUS | Status: DC
  Administered 2020-10-03: 40 [IU] via SUBCUTANEOUS
  Filled 2020-10-03 (×2): qty 0.4

## 2020-10-03 MED ORDER — VANCOMYCIN HCL 2000 MG/400ML IV SOLN
2000.0000 mg | Freq: Once | INTRAVENOUS | Status: AC
  Administered 2020-10-03: 2000 mg via INTRAVENOUS
  Filled 2020-10-03: qty 400

## 2020-10-03 MED ORDER — ETODOLAC 300 MG PO CAPS
300.0000 mg | ORAL_CAPSULE | Freq: Two times a day (BID) | ORAL | Status: DC
  Administered 2020-10-03 – 2020-10-04 (×2): 300 mg via ORAL
  Filled 2020-10-03 (×3): qty 1

## 2020-10-03 MED ORDER — SODIUM CHLORIDE 0.9 % IV SOLN
2.0000 g | INTRAVENOUS | Status: DC
  Administered 2020-10-03: 2 g via INTRAVENOUS
  Filled 2020-10-03: qty 20
  Filled 2020-10-03: qty 2

## 2020-10-03 NOTE — Progress Notes (Addendum)
Inpatient Diabetes Program Recommendations  AACE/ADA: New Consensus Statement on Inpatient Glycemic Control (2015)  Target Ranges:  Prepandial:   less than 140 mg/dL      Peak postprandial:   less than 180 mg/dL (1-2 hours)      Critically ill patients:  140 - 180 mg/dL   Lab Results  Component Value Date   GLUCAP 352 (H) 10/03/2020   HGBA1C 11.8 (H) 10/02/2020    Review of Glycemic Control  Diabetes history: DM 2 Outpatient Diabetes medications: Lantus 55 units QD, Novolog 12 units tid with meals  Current orders for Inpatient glycemic control:  Lantus 30 units Novolog 0-20 units tid + hs  A1c 11.8% this admission  Inpatient Diabetes Program Recommendations:    -  Increase Lantus to 45 units - Add Novolog 8 units tid meal coverage  [facility], however clinic unable to reach pt with phone number on demographics.  Spoke with pt at bedside regarding A1c level and glucose control at home. Pt does not take Novolog all the time but dose take the Lantus every day. Pt has all needed supplies at home. Pt is not consistent with diet. Discussed that her doctor referred her to Endocrinology but they could not get up with her. Spoke to pt about good glucose control for wound healing.  Thanks,  Christena Deem RN, MSN, BC-ADM Inpatient Diabetes Coordinator Team Pager [phone] (8a-5p)

## 2020-10-03 NOTE — Progress Notes (Addendum)
FPTS Interim Progress Note  S: Dr. Shanon Rosser and I went to see Nancy [name] this evening.  She was laying in her bed.  She reports that she has been feeling " jumpy", denies fevers or chills.  Pain in the groin is worsening.  She also endorses left leg edema and pain over the last week since the start of the cellulitis and would like more pain meds. She said if we don't give her pain meds she will get her sister to bring her   Patient had temp earlier 100.2 and received Tylenol for this her current temperature is 100.1  O: BP 117/77 (BP Location: Left Arm)   Pulse (!) 104   Temp 100.1 F (37.8 C) (Oral)   Resp 19   Ht 5\' 7"  (1.702 m)   Wt (!) 156.5 kg   SpO2 95%   BMI 54.04 kg/m   General: Alert, mild distress Cardio: Well-perfused Pulm:  normal work of breathing Extremities: No obvious peripheral edema, however difficult to visualize because of body habitus Genital: 2 cm round, white induration in left groin lateral to rectal wall.  No extension into the rectum.  No other overlying skin changes Neuro: Cranial nerves grossly intact.  A/P:  Left groin cellulitis  Concern for low-grade fevers on cefepime.  Likely inadequate treatment for infection.  Cannot rule rule out MRSA -Broaden to vancomycin, per pharmacy  -Follow-up blood cultures -Monitor for fever -For pain:Tylenol 650 every 6 as needed, oxycodone 10 mg every 4 as needed -Continue normal saline 150 ml/hour -Further 500 cc normal saline bolus -RN to page night team of any fevers or abnormal vital changes -Follow up CXR, UA for other signs of infection  Concern for left extremity edema Most likely secondary to groin cellulitis however patient remains tachycardic despite IV fluids and is endorsing lower extremity edema cannot rule out DVT/PE -Follow-up DVT ultrasound   , MD 10/03/2020, 6:40 PM PGY-2, [facility] Family Medicine Service pager [phone]

## 2020-10-03 NOTE — Progress Notes (Signed)
PHARMACY - PHYSICIAN COMMUNICATION CRITICAL VALUE ALERT - BLOOD CULTURE IDENTIFICATION (BCID)  Shakendra Griffeth is an 35 y.o. female who presented to [facility] on 10/02/2020 with a chief complaint of left thigh pain.   Assessment:  BCID reporting GPCs in clutsters, specifically Staph epidermidis (no resistances) in 1/4 bottles. Likely contaminiate   Name of physician (or Provider) Contacted: Franchot Erichsen, DO  Current antibiotics: Cefazolin 2g IV q12h   Changes to prescribed antibiotics recommended:  Patient is on recommended antibiotics - No changes needed  No results found for this or any previous visit.  Trixie Rude, PharmD PGY1 Acute Care Pharmacy Resident 10/03/2020 10:15 AM  Please check [email or web address] for unit-specific pharmacy phone numbers.

## 2020-10-03 NOTE — Progress Notes (Incomplete)
Family Medicine Teaching Service Daily Progress Note Intern Pager: [phone]  Patient name: Nancy [name] Medical record number: [id number] Date of birth: [birth year] Age: 35 y.o. Gender: female  Primary Care Provider: Courtney Paris, NP Consultants: None Code Status: DNR   Pt Overview and Major Events to Date:  Lan Harrisis a 35 y.o.femalepresenting with cellulitis of left thigh. PMH is significant forDMT2, chronic back pain, morbid obesity,tobacco use  L Groin Cellulitis L groin lesion with inguinal lymphadenopathy and concern for infection. Lactic acid normal at 1.7. WBC normal. Temperature to 100.1 on 3/8 and patient complained of worsening pain. Abx broadened to Vanc and Ceftriaxone - Cont Vanc, consider d/c Ceftriaxone - f/u blood cultures - CXR - UA   Tachycardia HR consistently elevated, ranging from 103-119 over the past 24 hours. Unsure of exact cause, potentially due to infection though normal white count, and afebrile. Potentially related to nicotine and though she has a 21mg  patch in place.   DM2 Recent A1c 12 reported in note on care everywhere, unclear date of testing. CBG 280  Home medication:20 novolog, 60U lantus qhs,and sliding scale. Yesterday patient received 40 of Lantus and 47 of Novolog.  - CBGs before meals and at bedtime  - Lantus 30 units at night and can titrate- increase to 45 - rSSI - Repeat BMP AM  Hypertension Bps have beenhypo to normotensiveranging from 117-145/77-99 over the past 24 hours. BP this morning 145/90. Echo with EF 60-65% Home medication: Lisinopril 2.5 daily - Consider restarting Lisinopril  HLD LDL unable to calculate due to elevated Triglycerides (511). HDL 20. Home medication: pravastatin  - start high intensity statin at d/c- atorvastatin or rosuvastatin   Bipolar 1  MDD Per chart review. Patient denies taking any medication for mood.  - Patient not interested in treatment  Migraine HA Home medication:  Topamax 50 mg dailyalso used for weight loss - Con't Topamax  Diabetic polyradiculoneuropathy Chronic pain Patient endorses pain in her back,arms,and knees. Reports taking 10 mg of oxycodone 7 times a day that she gets from [facility]. Confirmed from medical records sent from [facility] on PDMP. Home medication: Oxycodone 10 mg 7 times a day, Lyrica 300 mg twice daily, Flexeril, Toradol 600mg  qd - Con't Oxy 10mg  7 times a day prn. Can consider giving this during the day with 2 pills at night as she does at home - Lyrica 300 mg BID - etodolac 300mg  daily  Tobacco Use disorder 1-1.5PPD for 15 years. - nicotine patch 21mg   FEN/GI:Carb modified Prophylaxis:Lovenox  Disposition: Med-tele   Subjective:  No acute events overnight. 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 She endorses increased pain at area of abscess   Objective: Temp:  [97.7 F (36.5 C)-100.1 F (37.8 C)] 98.7 F (37.1 C) (03/09 0903) Pulse Rate:  [103-119] 110 (03/09 0903) Resp:  [18-19] 18 (03/09 0903) BP: (117-145)/(77-99) 145/90 (03/09 0903) SpO2:  [95 %-100 %] 100 % (03/09 0903) Weight:  [159.3 kg] 159.3 kg (03/09 0453) Physical Exam: General: *** Cardiovascular: *** Respiratory: *** Abdomen: *** Extremities: ***  Laboratory: Recent Labs  Lab 10/02/20 0940 10/03/20 0434 10/04/20 0744  WBC 8.1 6.4 7.2  HGB 14.6 13.4 13.0  HCT 43.4 40.6 40.7  PLT 252 235 207   Recent Labs  Lab 10/02/20 0940 10/03/20 0434 10/04/20 0744  NA 133* 132* 134*  K  4.2 4.2 3.8  CL 97* 98 101  CO2 23 23 24   BUN 7 10 9   CREATININE 0.59 0.45 0.53  CALCIUM 9.2 8.6* 8.5*  PROT 7.5  --   --   BILITOT 0.6  --   --   ALKPHOS 98  --   --   ALT 15  --   --   AST 20  --   --   GLUCOSE 437* 343* 279*    ***  Imaging/Diagnostic Tests: ***  , DO 10/04/2020, 12:16 PM PGY-***, [facility] Family Medicine FPTS Intern pager: [phone], text pages welcome

## 2020-10-03 NOTE — Progress Notes (Signed)
Pharmacy Antibiotic Note  Nancy [name] is a 35 y.o. female admitted on 10/02/2020 with cellulitis.  Pharmacy has been consulted for vanc dosing.  Pt presented with left thigh pain. She was started on cefazolin for cellulitis. Pt had temp of 100.1 tonight. Team will broad out abx to vanc + ceftriaxone. We will use trough target of 10-15.   Wt 157kg Scr 0.45  Plan: Vanc 2g IV x1 then 1.75g IV q12 Trough if needed  Height: 5\' 7"  (170.2 cm) Weight: (!) 156.5 kg (345 lb 0.3 oz) IBW/kg (Calculated) : 61.6  Temp (24hrs), Avg:99 F (37.2 C), Min:98.6 F (37 C), Max:100.1 F (37.8 C)  Recent Labs  Lab 10/02/20 0940 10/02/20 1255 10/02/20 1719 10/02/20 2012 10/03/20 0434 10/03/20 1037  WBC 8.1  --   --   --  6.4  --   CREATININE 0.59  --   --   --  0.45  --   LATICACIDVEN  --  3.1* 3.2* 2.2*  --  1.7    Estimated Creatinine Clearance: 158.7 mL/min (by C-G formula based on SCr of 0.45 mg/dL).    Allergies  Allergen Reactions  . Asa Buff (Mag [Buffered Aspirin] Anaphylaxis, Shortness Of Breath, Swelling and Other (See Comments)    "Made my throat become swollen to the point of closing"  . Aspirin Anaphylaxis, Shortness Of Breath, Swelling and Other (See Comments)    "Made my throat become swollen to the point of closing"  . Tolmetin Shortness Of Breath and Other (See Comments)    TOLECTIN (tolmetin sodium) is indicated for the relief of signs and symptoms of rheumatoid arthritis and osteoarthritis  . Prozac [Fluoxetine Hcl] Other (See Comments)    Reaction not recalled    Antimicrobials this admission: 3/7 ancef >> 3/8 3/8 vanc>> 3/8 ceftriaxone>>  Dose adjustments this admission:   Microbiology results: Blood cx 3/7: GPC in clusters >> Staph epi (1/4 bottles, likely contaminate)  3/8 blood>>  5/8, PharmD, [place], AAHIVP, CPP Infectious Disease Pharmacist 10/03/2020 7:03 PM

## 2020-10-03 NOTE — Progress Notes (Signed)
Family Medicine Teaching Service Daily Progress Note Intern Pager: [phone]  Patient name: Nancy [name] Medical record number: [id number] Date of birth: 13-Oct-1985 Age: 35 y.o. Gender: female  Primary Care Provider: Courtney Paris, NP Consultants: None  Code Status: DNR   Pt Overview and Major Events to Date:  Nadalee Neiswender is a 35 y.o. female presenting with cellulitis of left thigh. PMH is significant for DMT2, chronic back pain, morbid obesity, tobacco use   L Groin Cellulitis L groin lesion with inguinal lymphadenopathy and concern for infection. Lactic acid still elevated and downtrending at 2.2 -Continue Ancef - f/u blood cultures - f/u LA  - mIVF NS 150 mL/h - Up with assistance  DM2 Recent A1c 12% reported in note on care everywhere, unclear date of testing. CBG 352 this morning. Used 55 of Novolog and 30 of Lantus. Home medication: 20 novolog, 60U lantus qhs, and sliding scale - CBGs before meals and at bedtime  - Lantus 30 units at night and can titrate - rSSI - Repeat BMP AM - Recheck A1c  Hypertension Bps have been hypo to normotensive ranging from 101-139/68-100 over the past 24 hours . Last echo on 06/01/20 with EF 65-70% and mild concentric left ventricular hypertrophy. Home medication: Lisinopril 2.5 daily  - hold Lisinopril due to soft Bps - echo results pending   HLD LDL unable to calculate due to elevated Triglycerides (511). HDL 20.  Home medication: pravastatin  - confirm pravastatin dose as not on med rec and can re start   Bipolar 1  MDD Per chart review. Patient denies taking any medication for mood.  - Patient not interested in treatment    Migraine HA Home medication: Topamax 50 mg daily also used for weight loss - Con't Topamax   Diabetic polyradiculoneuropathy  Chronic pain  Patient endorses pain in her back, arms, and knees.  Reports taking 10 mg of oxycodone 7 times a day that she gets from [facility]. Confirmed from  medical records sent from [place] and on PDMP. Home medication: Oxycodone 10 mg 7 times a day, Lyrica 300 mg twice daily, Flexeril, Toradol 600mg  qd - Con't Oxy 10mg  7 times a day prn. Can consider giving this during the day with 2 pills at night as she does at home - Lyrica 300 mg BID - etodolac 300mg  daily  Tobacco Use disorder 1-1.5 PPD for 15 years.  - nicotine patch 21mg    FEN/GI: Carb modified Prophylaxis: Lovenox  Disposition: med-tele   Subjective:  No acute events overnight. Patient states she was not able to sleep at all with the beeping and frequent checks. States her heart rate is going down. Denied any concerns or complaints   Objective: Temp:  [98.6 F (37 C)-100.2 F (37.9 C)] 98.6 F (37 C) (03/08 0414) Pulse Rate:  [107-136] 107 (03/08 0414) Resp:  [11-27] 18 (03/08 0414) BP: (101-143)/(68-100) 129/90 (03/08 0414) SpO2:  [89 %-98 %] 97 % (03/08 0414) FiO2 (%):  [21 %] 21 % (03/07 1533) Weight:  [151 kg-156.5 kg] 156.5 kg (03/08 0414) Physical Exam: General: alert, NAD Cardiovascular: Tachycardic, regular rhythm. No murmurs  Respiratory: CTAB. Normal WOB Abdomen: soft, non distended Extremities: warm, dry. No edema   Laboratory: Recent Labs  Lab 10/02/20 0940 10/03/20 0434  WBC 8.1 6.4  HGB 14.6 13.4  HCT 43.4 40.6  PLT 252 235   Recent Labs  Lab 10/02/20 0940 10/03/20 0434  NA 133* 132*  K 4.2 4.2  CL 97* 98  CO2  23 23  BUN 7 10  CREATININE 0.59 0.45  CALCIUM 9.2 8.6*  PROT 7.5  --   BILITOT 0.6  --   ALKPHOS 98  --   ALT 15  --   AST 20  --   GLUCOSE 437* 343*     Imaging/Diagnostic Tests: CT Abdomen Pelvis W Contrast  Addendum Date: 10/02/2020   ADDENDUM REPORT: 10/02/2020 12:24 ADDENDUM: Comment: Note that there is a mildly prominent lymph node in the left inguinal region with a short axis diameter of 1.1 cm. Other inguinal lymph nodes are subcentimeter. There is no associated fluid or inflammation in the left inguinal region  appreciable. Electronically Signed   By: Bretta Bang III M.D.   On: 10/02/2020 12:24   Result Date: 10/02/2020 CLINICAL DATA:  Abdominal pain EXAM: CT ABDOMEN AND PELVIS WITH CONTRAST TECHNIQUE: Multidetector CT imaging of the abdomen and pelvis was performed using the standard protocol following bolus administration of intravenous contrast. CONTRAST:  OMNIPAQUE IOHEXOL 300 MG/ML  SOLN COMPARISON:  November 10, 2017 FINDINGS: Lower chest: Lung bases are clear. Hepatobiliary: Liver measures 22.8 cm in length. No focal liver lesions are appreciable. Gallbladder wall is not appreciably thickened. There is no biliary duct dilatation. Pancreas: There is no pancreatic mass or inflammatory focus. Spleen: No splenic lesions are evident. Adrenals/Urinary Tract: Adrenals bilaterally appear normal. No evident renal mass or hydronephrosis on either side. There is no evident renal or ureteral calculus on either side. Urinary bladder is midline with wall thickness within normal limits. Stomach/Bowel: Moderate stool present throughout colon. There is no appreciable bowel wall or mesenteric thickening. Terminal ileum appears normal. No evident bowel obstruction. There is no appreciable free air or portal venous air. Vascular/Lymphatic: No abdominal aortic aneurysm. No arterial vascular lesions are evident. Major venous structures appear patent. There is no evident adenopathy in the abdomen or pelvis based on size criteria. Several subcentimeter mesenteric lymph nodes are noted in the right abdomen. Reproductive: Uterus is anteverted.  No evident adnexal mass. Other: Appendix appears normal. No demonstrable abscess or ascites evident in the abdomen or pelvis. Musculoskeletal: No blastic or lytic bone lesions. No intramuscular or abdominal wall lesions are evident. IMPRESSION: 1.  Prominent liver without focal liver lesion evident. 2. No bowel wall thickening or bowel obstruction. No abscess in the abdomen or pelvis.  Appendix appears normal. 3. Subcentimeter mesenteric lymph nodes on the right which are considered nonspecific. In the appropriate clinical setting, lymph nodes of this nature potentially may be indicative of a degree of mesenteric adenitis. 4. No evident renal or ureteral calculus. No hydronephrosis. Urinary bladder wall thickness normal. Electronically Signed: By: Bretta Bang III M.D. On: 10/02/2020 11:59    Cora Collum, DO 10/03/2020, 7:52 AM PGY-1, [place] [facility] FPTS Intern pager: [phone], text pages welcome

## 2020-10-03 NOTE — Progress Notes (Signed)
°  Echocardiogram 2D Echocardiogram has been performed.  Pieter Partridge 10/03/2020, 2:02 PM

## 2020-10-03 NOTE — Hospital Course (Addendum)
Nancy [name] is a 35 y.o. female presenting with cellulitis of left thigh. PMH is significant for DMT2, chronic back pain, morbid obesity, tobacco use   Cellulitis 35 yo female patient presenting with concern for cellulitis x8 days.  Reports she has an "abscess" on her left groin area that has been increasing in size over the past week. CT shows no drainable pocket, but enlarged left inguinal LN of 1.1 cm. Patient tachycardic with HR of 125. Afebrile. Labs notable for LA elevated at 3.1. Normal WBC. In ED patient was started on Ancef and was given 1L NS bolus. On exam patient has a firm and enlarged LN in left inguinal canal as well as approx 1 cm hypopigmented lesion on L groin area, and induration to rectal wall. L. She received Ancef and her pain was controlled with her home pain regimen with the addition of Tylenol as needed.   DM2 Recent A1c 12% reported in note on care everywhere, unclear date of testing. Glucose on admission 437. CBGs were elevated during hospitalization. She received 50 ULantus at night and was on resistant sliding scale   Hypertension Bps have bene hypo to normotensive ranging from 101-143/68-82 . Echo with LVEF 60-65% Her home medication of Lisinopril 2.5 was held due to low blood pressure   HLD LDL unable to calculate due to elevated Triglycerides (511). HDL 20. Patient was continued on Pravastatin   All other medical conditions chronic and stable.

## 2020-10-03 NOTE — Progress Notes (Incomplete)
Pharmacy Student Note: For Learning Purposes ONLY. Not an active part of the chart.   Chief Complaint: ascess on left groin. Cellulitis of left thigh    PMH: T2DM, chronic back pain, morbid obesity, tobacco use   PSH:    Recommendations:  -remove nicotine patch 14 mg and keep 21 mg -pravastatin 20 mg   Admit Complaint:  #1 cellulitis -left groin abscess with induration to rectal wall -PE: color change on wound CT: no signs abscess but findings concerning for cellulitis -HR 125 -afebrile -LA 3.1 -lymphadenopathy  Plan: -Ancef 2 g Q8hrs -f/u blood cultures -F/u echo -IVF NS 150 mL/hr -diet: carb modifed -Lovenox for dvt ppx   #2 T2DM -A1c 12% On admit, glucose 437 -normal AG and CO2 >> no DKA -home meds: Lantus 60 units QHS, Novolog 20 units?, w/ sliding scale Plan: -Inpatient meds: Lantus 25 units QHS w/ rSSI -repeat BMP AM -Monitor BG and titrate >> increased lantus to 30 units >> BGs declined from 437 to 343 -Recheck A1c   #3 HTN Bps 101-143/68-82 EF 65-70% 06/01/20 -Home meds: lisinopril 2.5 daily, lasix 20 mg (0.5 tab of 40 mg tab) QAM -Inpatient: holding  Plan -continue to hold home meds due to soft bp -monitor Bps   #4 HLD -LDL 89 08/29/16 Home meds: pravastatin Plan: need to confirm dose (20 mg- Low intensity)  #5 Bipolar 1, MDD Dx per chart, pt denies medication -Plan: pt declines tx  #6 migraine HA (Chronic) Home meds: topiramate 50 mg daily  Plan Inpatient meds: continue topamax  #6 diabetic neuropathy w/ chronic pain -back, arms, knees Home meds: oxycodone 10 mg 7 times a day - confirmed with medical center, Lyrica 300 mg BID, Flexeril (holding), Toradol 600 mg daily (holding), diclofenac 1% gel (holding) Plan: -continue oxycodone 10 mg Q4 hrs (6 x / day) w/ reduced frequency -Lryica 300 mg BID -Lodine (etodolac 300 mg daily)   Tobacco use disorder:  Home meds: nicotine patch 21 mg, nicotine gum 2 gm PRN Plan: nicotine patch  21 mg (also 14 mg on chart)   GI: -home meds docusate -inpt meds: PEG   Other home meds held:  ferrous sulfate 1 tab daily, famotidine 40 mg QHS, magnesium 1 tab daily   Elliot Gurney, Lucienne Minks PharmD Student

## 2020-10-04 ENCOUNTER — Inpatient Hospital Stay (HOSPITAL_COMMUNITY)

## 2020-10-04 DIAGNOSIS — E1140 Type 2 diabetes mellitus with diabetic neuropathy, unspecified: Secondary | ICD-10-CM | POA: Diagnosis not present

## 2020-10-04 DIAGNOSIS — L03116 Cellulitis of left lower limb: Secondary | ICD-10-CM

## 2020-10-04 DIAGNOSIS — Z794 Long term (current) use of insulin: Secondary | ICD-10-CM

## 2020-10-04 DIAGNOSIS — R509 Fever, unspecified: Secondary | ICD-10-CM | POA: Diagnosis not present

## 2020-10-04 DIAGNOSIS — L03317 Cellulitis of buttock: Secondary | ICD-10-CM

## 2020-10-04 LAB — BASIC METABOLIC PANEL
Anion gap: 9 (ref 5–15)
BUN: 9 mg/dL (ref 6–20)
CO2: 24 mmol/L (ref 22–32)
Calcium: 8.5 mg/dL — ABNORMAL LOW (ref 8.9–10.3)
Chloride: 101 mmol/L (ref 98–111)
Creatinine, Ser: 0.53 mg/dL (ref 0.44–1.00)
GFR, Estimated: >60 mL/min (ref >60)
Glucose, Bld: 279 mg/dL — ABNORMAL HIGH (ref 70–99)
Potassium: 3.8 mmol/L (ref 3.5–5.1)
Sodium: 134 mmol/L — ABNORMAL LOW (ref 135–145)

## 2020-10-04 LAB — GLUCOSE, CAPILLARY
Glucose-Capillary: 280 mg/dL — ABNORMAL HIGH (ref 70–99)
Glucose-Capillary: 329 mg/dL — ABNORMAL HIGH (ref 70–99)

## 2020-10-04 LAB — CBC
HCT: 40.7 % (ref 36.0–46.0)
Hemoglobin: 13 g/dL (ref 12.0–15.0)
MCH: 28.1 pg (ref 26.0–34.0)
MCHC: 31.9 g/dL (ref 30.0–36.0)
MCV: 87.9 fL (ref 80.0–100.0)
Platelets: 207 10*3/uL (ref 150–400)
RBC: 4.63 MIL/uL (ref 3.87–5.11)
RDW: 12.8 % (ref 11.5–15.5)
WBC: 7.2 10*3/uL (ref 4.0–10.5)
nRBC: 0 % (ref 0.0–0.2)

## 2020-10-04 MED ORDER — CEPHALEXIN 500 MG PO CAPS
500.0000 mg | ORAL_CAPSULE | Freq: Four times a day (QID) | ORAL | Status: DC
  Administered 2020-10-04: 500 mg via ORAL
  Filled 2020-10-04: qty 1

## 2020-10-04 MED ORDER — CEPHALEXIN 500 MG PO CAPS: 500.0000 mg | ORAL_CAPSULE | Freq: Four times a day (QID) | ORAL | 0 refills | Status: AC

## 2020-10-04 MED ORDER — INSULIN GLARGINE 100 UNIT/ML ~~LOC~~ SOLN
50.0000 [IU] | Freq: Every day | SUBCUTANEOUS | Status: DC
  Filled 2020-10-04: qty 0.5

## 2020-10-04 MED ORDER — CYCLOBENZAPRINE HCL 10 MG PO TABS
10.0000 mg | ORAL_TABLET | Freq: Three times a day (TID) | ORAL | Status: DC | PRN
  Administered 2020-10-04: 10 mg via ORAL
  Filled 2020-10-04: qty 1

## 2020-10-04 NOTE — Progress Notes (Addendum)
Family Medicine Teaching [facility] Discharge Summary  Patient name: Nancy [name] Medical record number: [id number] Date of birth: [birth year] Age: 35 y.o. Gender: female Date of Admission: 10/02/2020  Date of Discharge: 10/04/20 Admitting Physician: Nestor Ramp, MD  Primary Care Provider: Courtney Paris, NP Consultants: None  Indication for Hospitalization: Cellulitis and tachycardia   Discharge Diagnoses/Problem List:  Cellulitis  Type 2 diabetes Hypertension  Hyperlipidemia  Tachycardia   Disposition: Home  Discharge Condition: Stable   Discharge Exam:  Temp:  [97.7 F (36.5 C)-100.1 F (37.8 C)] 98.7 F (37.1 C) (03/09 0903) Pulse Rate:  [103-119] 110 (03/09 0903) Resp:  [18-19] 18 (03/09 0903) BP: (117-145)/(77-99) 145/90 (03/09 0903) SpO2:  [95 %-100 %] 100 % (03/09 0903) Weight:  [159.3 kg] 159.3 kg (03/09 0453)  Physical Exam: General: alert, NAD Cardiovascular: RRR no murmurs Respiratory: CTAB normal WOB Abdomen: soft, non distended Extremities: warm, dry. Derm: L groin area with white 1-2 cm lesion on top of firm 6x10cm induration     Brief Hospital Course:   Nancy [name] is a 35 y.o. female presenting with cellulitis of left thigh. PMH is significant for DMT2, chronic back pain, morbid obesity, tobacco use.   Cellulitis Patient presented with concern for cellulitis x8 days. CT showed no drainable pocket, but enlarged left inguinal LN of 1.1 cm. Patient was tachycardic with HR of 125 and afebrile. Labs notable for LA elevated at 3.1. Normal WBC. In ED patient was started on Ancef and was given 1L NS bolus. On exam patient had a firm and enlarged LN in left inguinal canal as well as approx 1 cm hypopigmented lesion on L groin area, and induration extending to rectal wall. Her antibiotic was switched from Ancef to Ceftriaxone and Vancomycin for concern of worsening symptoms and pain. She received maintenance fluids, and her pain was controlled with her home  70mg  of oxycodone, Lyrica, and etodolac as well as Tylenol as needed. By time of discharge, she remained afebrile, without a white count, and blood cultures only showed staph epi which was likely a contaminant. She was switched to oral Keflex for an additional 5 days with recommended PCP follow up.   DM2 Glucose on admission 437. Her blood sugars were elevated during hospitalization and she was continued on Lantus which was titrated up to 40 units at night, and resistant sliding scale insulin.   Hypertension Blood pressures were hypo to normotensive ranging from 101-143/68-82. Echo performed which showed ejection fraction of 60-65%, Her home medication of Lisinopril was held due to lower range blood pressures. She remained hemodynamically stable during hospitalization.   All other medical conditions were chronic and stable   Issues for Follow Up:  1. Continue Keflex for 5 days: 500mg  4 times daily  2. Follow up with PCP 1-2 weeks after discharge to ensure resolution of infection  Significant Procedures: None   Significant Labs and Imaging:  Recent Labs  Lab 10/02/20 0940 10/03/20 0434 10/04/20 0744  WBC 8.1 6.4 7.2  HGB 14.6 13.4 13.0  HCT 43.4 40.6 40.7  PLT 252 235 207   Recent Labs  Lab 10/02/20 0940 10/03/20 0434 10/04/20 0744  NA 133* 132* 134*  K 4.2 4.2 3.8  CL 97* 98 101  CO2 23 23 24   GLUCOSE 437* 343* 279*  BUN 7 10 9   CREATININE 0.59 0.45 0.53  CALCIUM 9.2 8.6* 8.5*  ALKPHOS 98  --   --   AST 20  --   --   ALT  15  --   --   ALBUMIN 3.4*  --   --      Results/Tests Pending at Time of Discharge: none   Discharge Medications:  Allergies as of 10/04/2020      Reactions   Asa Buff (mag [buffered Aspirin] Anaphylaxis, Shortness Of Breath, Swelling, Other (See Comments)   "Made my throat become swollen to the point of closing"   Aspirin Anaphylaxis, Shortness Of Breath, Swelling, Other (See Comments)   "Made my throat become swollen to the point of closing"    Tolmetin Shortness Of Breath, Other (See Comments)   TOLECTIN (tolmetin sodium) is indicated for the relief of signs and symptoms of rheumatoid arthritis and osteoarthritis   Prozac [fluoxetine Hcl] Other (See Comments)   Reaction not recalled      Medication List    TAKE these medications   cephALEXin 500 MG capsule Commonly known as: KEFLEX Take 1 capsule (500 mg total) by mouth every 6 (six) hours for 5 days.   cyclobenzaprine 10 MG tablet Commonly known as: FLEXERIL Take 10 mg by mouth in the morning, at noon, and at bedtime.   diclofenac Sodium 1 % Gel Commonly known as: VOLTAREN Apply 2-8 g topically See admin instructions. Apply 2-8 grams to each leg nightly   docusate sodium 100 MG capsule Commonly known as: COLACE Take 1 capsule (100 mg total) by mouth 2 (two) times daily. What changed:   when to take this  reasons to take this   EPINEPHrine 0.3 mg/0.3 mL Soaj injection Commonly known as: EPI-PEN Inject 0.3 mg into the muscle as needed for anaphylaxis.   etodolac 600 MG 24 hr tablet Commonly known as: LODINE XL Take 600 mg by mouth daily at 12 noon.   famotidine 40 MG tablet Commonly known as: PEPCID Take 40 mg by mouth every evening.   furosemide 40 MG tablet Commonly known as: LASIX Take 0.5 tablets (20 mg total) by mouth in the morning. What changed:   how much to take  when to take this  reasons to take this   insulin aspart 100 UNIT/ML injection Commonly known as: novoLOG Inject 12 Units into the skin 3 (three) times daily with meals. What changed:   how much to take  additional instructions   insulin glargine 100 UNIT/ML injection Commonly known as: LANTUS Inject 0.55 mLs (55 Units total) into the skin at bedtime. What changed: how much to take   IRON PO Take 1 tablet by mouth daily.   lidocaine 5 % Commonly known as: LIDODERM Place 1 patch onto the skin daily. Remove & Discard patch within 12 hours or as directed by MD    lisinopril 5 MG tablet Commonly known as: ZESTRIL Take 2.5 mg by mouth daily.   MAGNESIUM PO Take 1 tablet by mouth daily.   nicotine 21 mg/24hr patch Commonly known as: NICODERM CQ - dosed in mg/24 hours Place 1 patch (21 mg total) onto the skin daily.   nicotine polacrilex 2 MG gum Commonly known as: NICORETTE Take 1 each (2 mg total) by mouth as needed for smoking cessation.   Oxycodone HCl 10 MG Tabs Take 10 mg by mouth See admin instructions. Take 7 times a day as needed for pain.   polyethylene glycol 17 g packet Commonly known as: MIRALAX / GLYCOLAX Take 17 g by mouth daily as needed for mild constipation.   potassium chloride SA 20 MEQ tablet Commonly known as: KLOR-CON Take 20 mEq by mouth as needed (  take with lasix).   pravastatin 20 MG tablet Commonly known as: PRAVACHOL Take 20 mg by mouth every evening.   pregabalin 300 MG capsule Commonly known as: LYRICA Take 300 mg by mouth 2 (two) times daily.   topiramate 50 MG tablet Commonly known as: TOPAMAX Take 50 mg by mouth in the morning.       Discharge Instructions: Please refer to Patient Instructions section of EMR for full details.  Patient was counseled important signs and symptoms that should prompt return to medical care, changes in medications, dietary instructions, activity restrictions, and follow up appointments.   Follow-Up Appointments:  Follow-up Information    Courtney Paris, NP. Schedule an appointment as soon as possible for a visit.   Specialty: Nurse Practitioner Why: Make appt in next week Contact information: [address] [place] [address] [phone]               Cora Collum, DO 10/04/2020, 12:17 PM PGY-1, [facility]

## 2020-10-04 NOTE — Progress Notes (Signed)
RN gave pt discharge instructions and the patient stated understanding. IV has been removed and the patient is getting dressed, pt did not want to have her DVT scan done prior to DC medical team notified.

## 2020-10-04 NOTE — Progress Notes (Signed)
Inpatient Diabetes Program Recommendations  AACE/ADA: New Consensus Statement on Inpatient Glycemic Control (2015)  Target Ranges:  Prepandial:   less than 140 mg/dL      Peak postprandial:   less than 180 mg/dL (1-2 hours)      Critically ill patients:  140 - 180 mg/dL   Lab Results  Component Value Date   GLUCAP 329 (H) 10/04/2020   HGBA1C 11.8 (H) 10/02/2020    Review of Glycemic Control Results for Nancy [name], Nancy [name] (MRN [id number]) as of 10/04/2020 11:17  Ref. Range 10/03/2020 06:12 10/03/2020 12:14 10/03/2020 16:45 10/03/2020 21:35 10/04/2020 06:05 10/04/2020 11:10  Glucose-Capillary Latest Ref Range: 70 - 99 mg/dL 810 (H) 175 (H) 102 (H) 356 (H) 280 (H) 329 (H)   Diabetes history: DM 2 Outpatient Diabetes medications: Lantus 55 units QD, Novolog 12 units tid with meals  Current orders for Inpatient glycemic control:  Lantus 50 units Novolog 0-20 units tid + hs  A1c 11.8% this admission  Inpatient Diabetes Program Recommendations:    - Add Novolog 8 units tid meal coverage  Thanks,  Christena Deem RN, MSN, BC-ADM Inpatient Diabetes Coordinator Team Pager [phone] (8a-5p)

## 2020-10-04 NOTE — Discharge Instructions (Signed)
You were treated in the hospital for cellulitis. Please continue to take antibiotics, keflex 500 mg, four times a day for the next five days.  If you have a fever (temp above 100.4*F), spreading redness, warmth, pustulant drainage, please seek medical care or go to the nearest emergency department.    Cellulitis, Adult  Cellulitis is a skin infection. The infected area is often warm, red, swollen, and sore. It occurs most often in the arms and lower legs. It is very important to get treated for this condition. What are the causes? This condition is caused by bacteria. The bacteria enter through a break in the skin, such as a cut, burn, insect bite, open sore, or crack. What increases the risk? This condition is more likely to occur in people who:  Have a weak body defense system (immune system).  Have open cuts, burns, bites, or scrapes on the skin.  Are older than 33 years of age.  Have a blood sugar problem (diabetes).  Have a long-lasting (chronic) liver disease (cirrhosis) or kidney disease.  Are very overweight (obese).  Have a skin problem, such as: ? Itchy rash (eczema). ? Slow movement of blood in the veins (venous stasis). ? Fluid buildup below the skin (edema).  Have been treated with high-energy rays (radiation).  Use IV drugs. What are the signs or symptoms? Symptoms of this condition include:  Skin that is: ? Red. ? Streaking. ? Spotting. ? Swollen. ? Sore or painful when you touch it. ? Warm.  A fever.  Chills.  Blisters. How is this diagnosed? This condition is diagnosed based on:  Medical history.  Physical exam.  Blood tests.  Imaging tests. How is this treated? Treatment for this condition may include:  Medicines to treat infections or allergies.  Home care, such as: ? Rest. ? Placing cold or warm cloths (compresses) on the skin.  Hospital care, if the condition is very bad. Follow these instructions at home: Medicines  Take  over-the-counter and prescription medicines only as told by your doctor.  If you were prescribed an antibiotic medicine, take it as told by your doctor. Do not stop taking it even if you start to feel better. General instructions  Drink enough fluid to keep your pee (urine) pale yellow.  Do not touch or rub the infected area.  Raise (elevate) the infected area above the level of your heart while you are sitting or lying down.  Place cold or warm cloths on the area as told by your doctor.  Keep all follow-up visits as told by your doctor. This is important.   Contact a doctor if:  You have a fever.  You do not start to get better after 1-2 days of treatment.  Your bone or joint under the infected area starts to hurt after the skin has healed.  Your infection comes back. This can happen in the same area or another area.  You have a swollen bump in the area.  You have new symptoms.  You feel ill and have muscle aches and pains. Get help right away if:  Your symptoms get worse.  You feel very sleepy.  You throw up (vomit) or have watery poop (diarrhea) for a long time.  You see red streaks coming from the area.  Your red area gets larger.  Your red area turns dark in color. These symptoms may represent a serious problem that is an emergency. Do not wait to see if the symptoms will go away. Get  medical help right away. Call your local emergency services (911 in the [place] Do not drive yourself to the hospital. Summary  Cellulitis is a skin infection. The area is often warm, red, swollen, and sore.  This condition is treated with medicines, rest, and cold and warm cloths.  Take all medicines only as told by your doctor.  Tell your doctor if symptoms do not start to get better after 1-2 days of treatment. This information is not intended to replace advice given to you by your health care provider. Make sure you discuss any questions you have with your health care  provider. Document Revised: 12/04/2017 Document Reviewed: 12/04/2017 Elsevier Patient Education  2021 [employer].

## 2020-10-05 LAB — CULTURE, BLOOD (ROUTINE X 2)

## 2020-10-05 NOTE — Progress Notes (Signed)
Contaminant most likely as she had no overt signs of bateremmia She is dischard on QID cephalexin to complete 7 days of antibiotic therapt.

## 2020-10-07 LAB — CULTURE, BLOOD (ROUTINE X 2)
Culture: NO GROWTH
Special Requests: ADEQUATE

## 2020-10-08 LAB — CULTURE, BLOOD (ROUTINE X 2)
Culture: NO GROWTH
Culture: NO GROWTH
Special Requests: ADEQUATE

## 2020-10-12 ENCOUNTER — Other Ambulatory Visit: Payer: Self-pay | Admitting: Family Medicine

## 2020-10-12 NOTE — Discharge Summary (Signed)
Family Medicine Teaching [facility] Discharge Summary  Patient name: Nancy [name] Medical record number: [id number] Date of birth: 08/24/1985 Age: 35 y.o. Gender: female Date of Admission: 10/02/2020  Date of Discharge: 10/12/20 Admitting Physician: Nestor Ramp, MD  Primary Care Provider: Courtney Paris, NP Consultants:   Indication for Hospitalization: Cellulitis and tachycardia   Discharge Diagnoses/Problem List:  Cellulitis  Type 2 diabetes Hypertension  Hyperlipidemia  Tachycardia   Disposition: home   Discharge Condition: medically stable for discharge   Discharge Exam:   General: alert, NAD Cardiovascular: RRR no murmurs Respiratory: CTAB normal WOB Abdomen: soft, non distended Extremities: warm, dry. Derm: L groin area with white 1-2 cm lesion on top of firm 6x10cm induration     Brief Hospital Course:  Nancy [name] is a 35 y.o. female presenting with cellulitis of left thigh. PMH is significant for DMT2, chronic back pain, morbid obesity, tobacco use   Cellulitis 35 yo female patient presenting with concern for cellulitis x8 days.  Reports she has an "abscess" on her left groin area that has been increasing in size over the past week. CT shows no drainable pocket, but enlarged left inguinal LN of 1.1 cm. Patient tachycardic with HR of 125. Afebrile. Labs notable for LA elevated at 3.1. Normal WBC. In ED patient was started on Ancef and was given 1L NS bolus. On exam patient has a firm and enlarged LN in left inguinal canal as well as approx 1 cm hypopigmented lesion on L groin area, and induration to rectal wall. L. She received Ancef and her pain was controlled with her home pain regimen with the addition of Tylenol as needed.   DM2 Recent A1c 12% reported in note on care everywhere, unclear date of testing. Glucose on admission 437. CBGs were elevated during hospitalization. She received 50 ULantus at night and was on resistant sliding scale   Hypertension Bps  have bene hypo to normotensive ranging from 101-143/68-82 . Echo with LVEF 60-65% Her home medication of Lisinopril 2.5 was held due to low blood pressure   HLD LDL unable to calculate due to elevated Triglycerides (511). HDL 20. Patient was continued on Pravastatin   All other medical conditions chronic and stable.     Issues for Follow Up:  1. Continue Keflex for 5 days: 500mg  4 times daily  2. Follow up with PCP 1-2 weeks after discharge to ensure resolution of infection  Significant Procedures: None  Significant Labs and Imaging:  No results for input(s): WBC, HGB, HCT, PLT in the last 168 hours. No results for input(s): NA, K, CL, CO2, GLUCOSE, BUN, CREATININE, CALCIUM, MG, PHOS, ALKPHOS, AST, ALT, ALBUMIN, PROTEIN in the last 168 hours.  Invalid input(s): TBILI    Results/Tests Pending at Time of Discharge: Discharge Medications:  Allergies as of 10/04/2020      Reactions   Asa Buff (mag [buffered Aspirin] Anaphylaxis, Shortness Of Breath, Swelling, Other (See Comments)   "Made my throat become swollen to the point of closing"   Aspirin Anaphylaxis, Shortness Of Breath, Swelling, Other (See Comments)   "Made my throat become swollen to the point of closing"   Tolmetin Shortness Of Breath, Other (See Comments)   TOLECTIN (tolmetin sodium) is indicated for the relief of signs and symptoms of rheumatoid arthritis and osteoarthritis   Prozac [fluoxetine Hcl] Other (See Comments)   Reaction not recalled      Medication List    TAKE these medications   cyclobenzaprine 10 MG tablet Commonly known as:  FLEXERIL Take 10 mg by mouth in the morning, at noon, and at bedtime.   diclofenac Sodium 1 % Gel Commonly known as: VOLTAREN Apply 2-8 g topically See admin instructions. Apply 2-8 grams to each leg nightly   docusate sodium 100 MG capsule Commonly known as: COLACE Take 1 capsule (100 mg total) by mouth 2 (two) times daily. What changed:   when to take this  reasons  to take this   EPINEPHrine 0.3 mg/0.3 mL Soaj injection Commonly known as: EPI-PEN Inject 0.3 mg into the muscle as needed for anaphylaxis.   etodolac 600 MG 24 hr tablet Commonly known as: LODINE XL Take 600 mg by mouth daily at 12 noon.   famotidine 40 MG tablet Commonly known as: PEPCID Take 40 mg by mouth every evening.   furosemide 40 MG tablet Commonly known as: LASIX Take 0.5 tablets (20 mg total) by mouth in the morning. What changed:   how much to take  when to take this  reasons to take this   insulin aspart 100 UNIT/ML injection Commonly known as: novoLOG Inject 12 Units into the skin 3 (three) times daily with meals. What changed:   how much to take  additional instructions   insulin glargine 100 UNIT/ML injection Commonly known as: LANTUS Inject 0.55 mLs (55 Units total) into the skin at bedtime. What changed: how much to take   IRON PO Take 1 tablet by mouth daily.   lidocaine 5 % Commonly known as: LIDODERM Place 1 patch onto the skin daily. Remove & Discard patch within 12 hours or as directed by MD   lisinopril 5 MG tablet Commonly known as: ZESTRIL Take 2.5 mg by mouth daily.   MAGNESIUM PO Take 1 tablet by mouth daily.   nicotine 21 mg/24hr patch Commonly known as: NICODERM CQ - dosed in mg/24 hours Place 1 patch (21 mg total) onto the skin daily.   nicotine polacrilex 2 MG gum Commonly known as: NICORETTE Take 1 each (2 mg total) by mouth as needed for smoking cessation.   Oxycodone HCl 10 MG Tabs Take 10 mg by mouth See admin instructions. Take 7 times a day as needed for pain.   polyethylene glycol 17 g packet Commonly known as: MIRALAX / GLYCOLAX Take 17 g by mouth daily as needed for mild constipation.   potassium chloride SA 20 MEQ tablet Commonly known as: KLOR-CON Take 20 mEq by mouth as needed (take with lasix).   pravastatin 20 MG tablet Commonly known as: PRAVACHOL Take 20 mg by mouth every evening.   pregabalin  300 MG capsule Commonly known as: LYRICA Take 300 mg by mouth 2 (two) times daily.   topiramate 50 MG tablet Commonly known as: TOPAMAX Take 50 mg by mouth in the morning.     ASK your doctor about these medications   cephALEXin 500 MG capsule Commonly known as: KEFLEX Take 1 capsule (500 mg total) by mouth every 6 (six) hours for 5 days. Ask about: Should I take this medication?       Discharge Instructions: Please refer to Patient Instructions section of EMR for full details.  Patient was counseled important signs and symptoms that should prompt return to medical care, changes in medications, dietary instructions, activity restrictions, and follow up appointments.   Follow-Up Appointments:  Follow-up Information    Courtney Paris, NP. Schedule an appointment as soon as possible for a visit.   Specialty: Nurse Practitioner Why: Make appt in next week Contact information:  [address] [place] [address] [phone]               Towanda Octave, MD 10/12/2020, 9:34 AM PGY-2, [facility]

## 2020-11-06 DIAGNOSIS — F17200 Nicotine dependence, unspecified, uncomplicated: Secondary | ICD-10-CM | POA: Insufficient documentation

## 2020-11-06 DIAGNOSIS — G4733 Obstructive sleep apnea (adult) (pediatric): Secondary | ICD-10-CM | POA: Insufficient documentation

## 2021-04-19 DIAGNOSIS — K760 Fatty (change of) liver, not elsewhere classified: Secondary | ICD-10-CM | POA: Insufficient documentation

## 2021-10-02 DIAGNOSIS — M1711 Unilateral primary osteoarthritis, right knee: Secondary | ICD-10-CM | POA: Insufficient documentation

## 2022-03-12 DIAGNOSIS — Z79899 Other long term (current) drug therapy: Secondary | ICD-10-CM | POA: Insufficient documentation

## 2022-03-12 DIAGNOSIS — F1320 Sedative, hypnotic or anxiolytic dependence, uncomplicated: Secondary | ICD-10-CM | POA: Insufficient documentation

## 2022-03-12 DIAGNOSIS — F1190 Opioid use, unspecified, uncomplicated: Secondary | ICD-10-CM | POA: Insufficient documentation

## 2022-06-05 ENCOUNTER — Other Ambulatory Visit (HOSPITAL_COMMUNITY): Payer: Self-pay

## 2022-06-06 ENCOUNTER — Other Ambulatory Visit (HOSPITAL_COMMUNITY): Payer: Self-pay

## 2022-06-06 MED ORDER — OXYCODONE HCL 15 MG PO TABS
15.0000 mg | ORAL_TABLET | ORAL | 0 refills | Status: DC
  Filled 2022-06-06 (×2): qty 180, 30d supply, fill #0
  Filled ????-??-??: fill #0

## 2022-06-10 ENCOUNTER — Other Ambulatory Visit (HOSPITAL_COMMUNITY): Payer: Self-pay

## 2022-09-17 ENCOUNTER — Encounter (HOSPITAL_COMMUNITY): Payer: Self-pay | Admitting: Emergency Medicine

## 2022-09-17 ENCOUNTER — Emergency Department (HOSPITAL_COMMUNITY)
Admission: EM | Admit: 2022-09-17 | Discharge: 2022-09-18 | Discharge status: Against Medical Advice | Attending: Emergency Medicine | Admitting: Emergency Medicine

## 2022-09-17 ENCOUNTER — Other Ambulatory Visit: Payer: Self-pay

## 2022-09-17 DIAGNOSIS — R739 Hyperglycemia, unspecified: Secondary | ICD-10-CM | POA: Diagnosis present

## 2022-09-17 DIAGNOSIS — Z5321 Procedure and treatment not carried out due to patient leaving prior to being seen by health care provider: Secondary | ICD-10-CM | POA: Diagnosis not present

## 2022-09-17 LAB — CBG MONITORING, ED: Glucose-Capillary: 418 mg/dL — ABNORMAL HIGH (ref 70–99)

## 2022-09-17 NOTE — ED Triage Notes (Signed)
Pt c/o high blood sugar readings at home for the past couple days despite no missed doses of insulin. Pt states her dexcom has been reading HI. Endorses n/v.

## 2022-09-17 NOTE — ED Triage Notes (Signed)
Call for VS recheck, No response

## 2023-02-27 ENCOUNTER — Other Ambulatory Visit: Payer: Self-pay

## 2023-02-27 ENCOUNTER — Encounter (HOSPITAL_COMMUNITY): Payer: Self-pay

## 2023-02-27 ENCOUNTER — Ambulatory Visit (HOSPITAL_COMMUNITY)
Admission: EM | Admit: 2023-02-27 | Discharge: 2023-02-27 | Disposition: A | Discharge status: Other Acute Inpatient Hospital

## 2023-02-27 ENCOUNTER — Emergency Department (HOSPITAL_COMMUNITY)
Admission: EM | Admit: 2023-02-27 | Discharge: 2023-02-28 | Disposition: A | Discharge status: Other Acute Inpatient Hospital | Attending: Emergency Medicine | Admitting: Emergency Medicine

## 2023-02-27 DIAGNOSIS — I509 Heart failure, unspecified: Secondary | ICD-10-CM | POA: Insufficient documentation

## 2023-02-27 DIAGNOSIS — R45851 Suicidal ideations: Secondary | ICD-10-CM

## 2023-02-27 DIAGNOSIS — Z0279 Encounter for issue of other medical certificate: Secondary | ICD-10-CM | POA: Diagnosis not present

## 2023-02-27 DIAGNOSIS — Z79899 Other long term (current) drug therapy: Secondary | ICD-10-CM | POA: Diagnosis not present

## 2023-02-27 DIAGNOSIS — E119 Type 2 diabetes mellitus without complications: Secondary | ICD-10-CM | POA: Insufficient documentation

## 2023-02-27 DIAGNOSIS — F3130 Bipolar disorder, current episode depressed, mild or moderate severity, unspecified: Secondary | ICD-10-CM

## 2023-02-27 DIAGNOSIS — R339 Retention of urine, unspecified: Secondary | ICD-10-CM | POA: Diagnosis not present

## 2023-02-27 LAB — CBC WITH DIFFERENTIAL/PLATELET
Abs Immature Granulocytes: 0.01 10*3/uL (ref 0.00–0.07)
Basophils Absolute: 0 10*3/uL (ref 0.0–0.1)
Basophils Relative: 0 %
Eosinophils Absolute: 0.1 10*3/uL (ref 0.0–0.5)
Eosinophils Relative: 1 %
HCT: 45.1 % (ref 36.0–46.0)
Hemoglobin: 14.6 g/dL (ref 12.0–15.0)
Immature Granulocytes: 0 %
Lymphocytes Relative: 71 %
Lymphs Abs: 6.1 10*3/uL — ABNORMAL HIGH (ref 0.7–4.0)
MCH: 28.9 pg (ref 26.0–34.0)
MCHC: 32.4 g/dL (ref 30.0–36.0)
MCV: 89.1 fL (ref 80.0–100.0)
Monocytes Absolute: 0.4 10*3/uL (ref 0.1–1.0)
Monocytes Relative: 4 %
Neutro Abs: 2.1 10*3/uL (ref 1.7–7.7)
Neutrophils Relative %: 24 %
Platelets: 292 10*3/uL (ref 150–400)
RBC: 5.06 MIL/uL (ref 3.87–5.11)
RDW: 13.1 % (ref 11.5–15.5)
WBC: 8.7 10*3/uL (ref 4.0–10.5)
nRBC: 0 % (ref 0.0–0.2)

## 2023-02-27 LAB — COMPREHENSIVE METABOLIC PANEL
ALT: 15 U/L (ref 0–44)
AST: 16 U/L (ref 15–41)
Albumin: 3.6 g/dL (ref 3.5–5.0)
Alkaline Phosphatase: 72 U/L (ref 38–126)
Anion gap: 12 (ref 5–15)
BUN: 6 mg/dL (ref 6–20)
CO2: 27 mmol/L (ref 22–32)
Calcium: 9.1 mg/dL (ref 8.9–10.3)
Chloride: 101 mmol/L (ref 98–111)
Creatinine, Ser: 0.68 mg/dL (ref 0.44–1.00)
GFR, Estimated: >60 mL/min (ref >60)
Glucose, Bld: 107 mg/dL — ABNORMAL HIGH (ref 70–99)
Potassium: 3.9 mmol/L (ref 3.5–5.1)
Sodium: 140 mmol/L (ref 135–145)
Total Bilirubin: 0.2 mg/dL — ABNORMAL LOW (ref 0.3–1.2)
Total Protein: 7.4 g/dL (ref 6.5–8.1)

## 2023-02-27 LAB — HCG, SERUM, QUALITATIVE: Preg, Serum: NEGATIVE

## 2023-02-27 LAB — ETHANOL: Alcohol, Ethyl (B): <10 mg/dL (ref <10)

## 2023-02-27 MED ORDER — PRAVASTATIN SODIUM 10 MG PO TABS
20.0000 mg | ORAL_TABLET | Freq: Every evening | ORAL | Status: DC
  Administered 2023-02-28: 20 mg via ORAL
  Filled 2023-02-27: qty 2

## 2023-02-27 MED ORDER — LORAZEPAM 1 MG PO TABS
1.0000 mg | ORAL_TABLET | ORAL | Status: DC | PRN
  Filled 2023-02-27: qty 1

## 2023-02-27 MED ORDER — SEMAGLUTIDE(0.25 OR 0.5MG/DOS) 2 MG/3ML ~~LOC~~ SOPN: 1.5000 mg | PEN_INJECTOR | SUBCUTANEOUS | Status: DC

## 2023-02-27 MED ORDER — FAMOTIDINE 20 MG PO TABS
40.0000 mg | ORAL_TABLET | Freq: Every evening | ORAL | Status: DC
  Administered 2023-02-28: 40 mg via ORAL
  Filled 2023-02-27: qty 2

## 2023-02-27 MED ORDER — RISPERIDONE 1 MG PO TBDP
2.0000 mg | ORAL_TABLET | Freq: Three times a day (TID) | ORAL | Status: DC | PRN
  Filled 2023-02-27: qty 2

## 2023-02-27 MED ORDER — VENLAFAXINE HCL ER 75 MG PO CP24: 75.0000 mg | ORAL_CAPSULE | Freq: Every day | ORAL | Status: DC

## 2023-02-27 MED ORDER — AMPHETAMINE-DEXTROAMPHET ER 20 MG PO CP24: 20.0000 mg | ORAL_CAPSULE | Freq: Every day | ORAL | Status: DC

## 2023-02-27 MED ORDER — ZIPRASIDONE MESYLATE 20 MG IM SOLR: 20.0000 mg | INTRAMUSCULAR | Status: DC | PRN

## 2023-02-27 MED ORDER — ACETAMINOPHEN 325 MG PO TABS: 650.0000 mg | ORAL_TABLET | ORAL | Status: DC | PRN

## 2023-02-27 MED ORDER — LISINOPRIL 2.5 MG PO TABS
2.5000 mg | ORAL_TABLET | Freq: Every day | ORAL | Status: DC
  Administered 2023-02-28: 2.5 mg via ORAL
  Filled 2023-02-27: qty 1

## 2023-02-27 MED ORDER — PREGABALIN 75 MG PO CAPS
300.0000 mg | ORAL_CAPSULE | Freq: Two times a day (BID) | ORAL | Status: DC
  Administered 2023-02-28 (×2): 300 mg via ORAL
  Filled 2023-02-27 (×2): qty 4

## 2023-02-27 MED ORDER — OXYCODONE HCL 5 MG PO TABS
15.0000 mg | ORAL_TABLET | Freq: Once | ORAL | Status: AC
  Administered 2023-02-27: 15 mg via ORAL
  Filled 2023-02-27: qty 3

## 2023-02-27 MED ORDER — LURASIDONE HCL 40 MG PO TABS
40.0000 mg | ORAL_TABLET | Freq: Every day | ORAL | Status: DC
  Administered 2023-02-28: 40 mg via ORAL
  Filled 2023-02-27 (×2): qty 1

## 2023-02-27 MED ORDER — OXYCODONE HCL 5 MG PO TABS
15.0000 mg | ORAL_TABLET | Freq: Three times a day (TID) | ORAL | Status: DC | PRN
  Administered 2023-02-28 (×2): 15 mg via ORAL
  Filled 2023-02-27 (×2): qty 3

## 2023-02-27 MED ORDER — ZOLPIDEM TARTRATE 5 MG PO TABS: 5.0000 mg | ORAL_TABLET | Freq: Every evening | ORAL | Status: DC | PRN

## 2023-02-27 MED ORDER — ONDANSETRON HCL 4 MG PO TABS: 4.0000 mg | ORAL_TABLET | Freq: Three times a day (TID) | ORAL | Status: DC | PRN

## 2023-02-27 MED ORDER — LORAZEPAM 1 MG PO TABS
1.0000 mg | ORAL_TABLET | Freq: Two times a day (BID) | ORAL | Status: DC | PRN
  Administered 2023-02-28: 1 mg via ORAL

## 2023-02-27 NOTE — Progress Notes (Signed)
   02/27/23 1723  [facility] Triage Screening (Walk[facility]ins at [facility])  What Is the Reason for Your Visit/Call Today? Pt presents to [facility] voluntarily. Pt reports having suicidal thoughts that have been worsening over time. Pt states, "my husband takes all of my medications and it has been getting to me, and my life has been out of control and I feel likeI want to die" Pt is tearful and states, "I have no one in my corner." Pt states, "it took everything in me not to take the pills today to kill myself." Pt reports a plan to take pills and end her life today. Pt reports a suicide attempt in 2017. Pt reports she consistently has had thoughts of wanting to end her life. Pt does report to have ADHD, Bipolar and PTSD. Pt has never seen a therapist. Pt reports she is interested in seeking therpay services at this time of crisis.Pt denies any hx of substance use. Pt denies HI and AVH.  How Long Has This Been Causing You Problems? <Week  Have You Recently Had Any Thoughts About Hurting Yourself? Yes  How long ago did you have thoughts about hurting yourself? today  Are You Planning to Commit Suicide/Harm Yourself At This time? Yes  Have you Recently Had Thoughts About Hurting Someone Karolee Ohs? No  Are You Planning To Harm Someone At This Time? No  Are you currently experiencing any auditory, visual or other hallucinations? No  Have You Used Any Alcohol or Drugs in the Past 24 Hours? No  Do you have any current medical co-morbidities that require immediate attention? No  Clinician description of patient physical appearance/behavior: pt is tearful, poorly groomed, cooperative  What Do You Feel Would Help You the Most Today? Stress Management;Social Support;Treatment for Depression or other mood problem  If access to [facility] was not available, would you have sought care in the Emergency Department? No  Determination of Need Emergent (2 hours)  Options For Referral Medication Management;Inpatient Hospitalization

## 2023-02-27 NOTE — Discharge Instructions (Addendum)
Transfer to [facility] ED for medical clearance.

## 2023-02-27 NOTE — Progress Notes (Signed)
LCSW Progress Note  [id number]   Nancy [name]  02/27/2023  11:20 PM    Inpatient Behavioral Health Placement  Pt meets inpatient criteria per [facility]. There are no available beds within [facility] per [facility] Erica Wright,RN. Referral was sent to the following facilities;   Destination  Service Provider Address Phone [facility]  [address] [place], [place] [address] [phone]  [facility]  [address] [place] [address] [phone]  [facility]  [address] [place] [address] [phone]  [facility]  [address] [place] [address] [phone]  [facility] [address] [place]  [address] [place] [address] [phone]  [facility]  [address] [place] [address] [phone]  [facility]  [address] [place], [place] [address] [phone]  [facility]  [address] [place] [address] [phone]  [facility]  [address] [place] [address] [phone]  [facility]  [address], [place] [address] [phone]  [facility]  [address] [place] [address] [phone]  [facility]  [address] [place] [address] [phone]  [facility]  [address] [place] [address] [phone]  [facility]  [address] [place] [address] [phone]  [facility]  [address] [place] [address] [phone]  [facility]  [address], [place] [address] [phone]   [facility]  [address] [place] [address] [phone]  [facility]  [address] [place] [address] [phone]  [facility]  [address], [place] [address] [phone]  [facility]  [address], [place] [address] [phone]  [facility]  [address] [place], [place] [address] [phone]  [facility]  [address], [place] [address] [phone]  [facility]  [address] [place] [address] [phone]  [facility]  [address] [place] [address] [phone]  [facility]  [address] [place] [address] [phone]  [facility]  [address] [place] [address] [phone]  [facility]  [address] [place] [address] [phone]  [facility]  [address] [place], [place] [address] [phone]  [facility]  [address], [place] [address] [phone]  [facility]  [address], [place] [address] [phone]  [facility]  [address] [place] [address] [phone]  [facility]  [address] [place] [address] [phone]  [facility]  [address] [place] [address] [phone]    Situation ongoing,  CSW will follow up.    Maryjean Ka, MSW, [facility] 02/27/2023 11:20 PM
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
## 2023-02-27 NOTE — Progress Notes (Signed)
   02/27/23 1723  [facility] Triage Screening (Walk-ins at [facility] only)  What Is the Reason for Your Visit/Call Today? Pt presents to [facility]. Pt reports having suicidal thoughts. Pt states, "my husband takes all of my medications and it has been getting to me, and my life has been out of control and I feel likeI want to die" Pt is tearful and states, "I have no one in my corner." Pt states, "it took everything in me not to take the pills today to kill myself." Pt reports a plan to take pills and end her life today. Pt reports a suicide attempt in 2017. Pt reports she consistently has had thoughts of wanting to end her life. Pt does report to have ADHD, Bipolar and PTSD. Pt has never seen a therapist. Pt reports she is interested in seeking therpay services at this time of crisis.Pt denies any hx of substance use. Pt denies HI and AVH.  How Long Has This Been Causing You Problems? <Week  Have You Recently Had Any Thoughts About Hurting Yourself? Yes  How long ago did you have thoughts about hurting yourself? today  Are You Planning to Commit Suicide/Harm Yourself At This time? Yes  Have you Recently Had Thoughts About Hurting Someone Karolee Ohs? No  Are You Planning To Harm Someone At This Time? No  Are you currently experiencing any auditory, visual or other hallucinations? No  What Do You Feel Would Help You the Most Today? Stress Management;Social Support;Treatment for Depression or other mood problem  If access to [facility] was not available, would you have sought care in the Emergency Department? No  Determination of Need Emergent (2 hours)  Options For Referral Medication Management;Inpatient Hospitalization

## 2023-02-27 NOTE — Progress Notes (Signed)
   02/27/23 1723  [facility] Triage Screening (Walk-ins at [facility] only)  What Is the Reason for Your Visit/Call Today? Pt presents to [facility] voluntarily. Pt reports having suicidal thoughts that have been worsening over time. Pt states, "my husband takes all of my medications and it has been getting to me, and my life has been out of control and I feel likeI want to die" Pt is tearful and states, "I have no one in my corner." Pt states, "it took everything in me not to take the pills today to kill myself." Pt reports a plan to take pills and end her life today. Pt reports a suicide attempt in 2017. Pt reports she consistently has had thoughts of wanting to end her life. Pt does report to have ADHD, Bipolar and PTSD. Pt reports not being able to urinate since Sunday. This is not an normal occurance per the pt. Pt has never seen a therapist. Pt reports she is interested in seeking therapy services at this time of crisis. Pt denies any hx of substance use. Pt denies HI and AVH.  How Long Has This Been Causing You Problems? <Week  Have You Recently Had Any Thoughts About Hurting Yourself? Yes  How long ago did you have thoughts about hurting yourself? today  Are You Planning to Commit Suicide/Harm Yourself At This time? Yes  Have you Recently Had Thoughts About Hurting Someone Karolee Ohs? No  Are You Planning To Harm Someone At This Time? No  Explanation: N/A  Are you currently experiencing any auditory, visual or other hallucinations? No  Have You Used Any Alcohol or Drugs in the Past 24 Hours? No  What Did You Use and How Much? N/A  Do you have any current medical co-morbidities that require immediate attention? No  Clinician description of patient physical appearance/behavior: pt is tearful, poorly groomed, cooperative  What Do You Feel Would Help You the Most Today? Stress Management;Social Support;Treatment for Depression or other mood problem  If access to [facility] was not available, would you have sought  care in the Emergency Department? No  Determination of Need Emergent (2 hours)  Options For Referral Inpatient Hospitalization

## 2023-02-27 NOTE — ED Provider Notes (Addendum)
[place] EMERGENCY DEPARTMENT AT [facility] Provider Note   CSN: [id number] Arrival date & time: 02/27/23  1925     History  Chief Complaint  Patient presents with   Suicide Attempt    Nancy [name] is a 37 y.o. female.  HPI     37 year old female comes in with chief complaint of medical clearance.  Patient was seen at behavioral health urgent care.  Patient indicated to be her provider that she had overdosed on 40 pills of doxepin over 2 days ago.  She was sent to the ER because of urinary retention.  Patient indicates to me that she never overdosed.  She states that she has been feeling suicidal, and she intentionally chose to go to [facility], as per the recommendations of her psychiatrist and did not want to come to the emergency room.  She indicates that she has not actually urinated in over 4 days now.  Her symptoms have been triggered because of anniversary of her father's death.  Patient states that she told the [facility] provider that her husband took extra doxepin's, which are prescribed to him and was unable to move.  She states that she is on several medications and never overdosed on anything.  Home Medications Prior to Admission medications   Medication Sig Start Date End Date Taking? Authorizing Provider  amphetamine-dextroamphetamine (ADDERALL XR) 20 MG 24 hr capsule Take 20 mg by mouth daily.   Yes [provider]  cyclobenzaprine (FLEXERIL) 10 MG tablet Take 10 mg by mouth 3 (three) times daily as needed for muscle spasms.   Yes [provider]  desvenlafaxine (PRISTIQ) 50 MG 24 hr tablet Take 50 mg by mouth at bedtime.   Yes [provider]  famotidine (PEPCID) 40 MG tablet Take 40 mg by mouth every evening. 08/08/20  Yes [provider]  lisinopril (ZESTRIL) 5 MG tablet Take 2.5 mg by mouth daily. 04/20/20  Yes [provider]  LORazepam (ATIVAN) 1 MG tablet Take 1 mg by mouth 2 (two) times daily as needed for  anxiety.   Yes [provider]  lurasidone (LATUDA) 40 MG TABS tablet Take 40 mg by mouth daily with breakfast.   Yes [provider]  oxyCODONE (ROXICODONE) 15 MG immediate release tablet Take 15 mg by mouth every 4 (four) hours as needed for pain.   Yes [provider]  pravastatin (PRAVACHOL) 20 MG tablet Take 20 mg by mouth every evening. 09/26/20  Yes [provider]  pregabalin (LYRICA) 300 MG capsule Take 300 mg by mouth 2 (two) times daily. 02/24/20  Yes [provider]  Semaglutide,0.25 or 0.5MG /DOS, (OZEMPIC, 0.25 OR 0.5 MG/DOSE,) 2 MG/3ML SOPN Inject 1.5 mg into the skin once a week. Wednesday 05/14/22  Yes [provider]  EPINEPHrine 0.3 mg/0.3 mL IJ SOAJ injection Inject 0.3 mg into the muscle as needed for anaphylaxis.  Patient not taking: Reported on 02/27/2023    [provider]      Allergies    Asa buff (mag [buffered aspirin], Aspirin, Tolmetin, and Prozac [fluoxetine hcl]    Review of Systems   Review of Systems  All other systems reviewed and are negative.   Physical Exam Updated Vital Signs BP (!) 131/92   Pulse (!) 112   Temp 98.4 F (36.9 C) (Oral)   Resp 18   Ht 5\' 7"  (1.702 m)   Wt (!) 142.9 kg   SpO2 97%   BMI 49.34 kg/m  Physical  Exam Vitals and nursing note reviewed.  Constitutional:      Appearance: She is well-developed.  HENT:     Head: Atraumatic.  Cardiovascular:     Rate and Rhythm: Normal rate.  Pulmonary:     Effort: Pulmonary effort is normal.  Abdominal:     Tenderness: There is no abdominal tenderness.  Musculoskeletal:     Cervical back: Normal range of motion and neck supple.  Skin:    General: Skin is warm and dry.  Neurological:     Mental Status: She is alert and oriented to person, place, and time.     ED Results / Procedures / Treatments   Labs (all labs ordered are listed, but only abnormal results are displayed) Labs Reviewed  COMPREHENSIVE METABOLIC PANEL  - Abnormal; Notable for the following components:      Result Value   Glucose, Bld 107 (*)    Total Bilirubin 0.2 (*)    All other components within normal limits  CBC WITH DIFFERENTIAL/PLATELET - Abnormal; Notable for the following components:   Lymphs Abs 6.1 (*)    All other components within normal limits  ETHANOL  HCG, SERUM, QUALITATIVE  RAPID URINE DRUG SCREEN, HOSP PERFORMED    EKG None  Radiology No results found.  Procedures Procedures    Medications Ordered in ED Medications  risperiDONE (RISPERDAL M-TABS) disintegrating tablet 2 mg (has no administration in time range)    And  LORazepam (ATIVAN) tablet 1 mg (has no administration in time range)    And  ziprasidone (GEODON) injection 20 mg (has no administration in time range)  acetaminophen (TYLENOL) tablet 650 mg (has no administration in time range)  ondansetron (ZOFRAN) tablet 4 mg (has no administration in time range)  zolpidem (AMBIEN) tablet 5 mg (has no administration in time range)  venlafaxine XR (EFFEXOR-XR) 24 hr capsule 75 mg (has no administration in time range)  famotidine (PEPCID) tablet 40 mg (has no administration in time range)  lisinopril (ZESTRIL) tablet 2.5 mg (has no administration in time range)  LORazepam (ATIVAN) tablet 1 mg (has no administration in time range)  lurasidone (LATUDA) tablet 40 mg (has no administration in time range)  oxyCODONE (Oxy IR/ROXICODONE) immediate release tablet 15 mg (has no administration in time range)  pravastatin (PRAVACHOL) tablet 20 mg (has no administration in time range)  pregabalin (LYRICA) capsule 300 mg (has no administration in time range)  oxyCODONE (Oxy IR/ROXICODONE) immediate release tablet 15 mg (15 mg Oral Given 02/27/23 2210)    ED Course/ Medical Decision Making/ A&P                                 Medical Decision Making Amount and/or Complexity of Data Reviewed Labs: ordered.  Risk OTC drugs. Prescription drug  management.   37 year old patient comes in with chief complaint of medical clearance.  Patient has history of bipolar disorder, anxiety, diabetes, CHF.  It appears that there is some confusion about overdose.  Patient clearly states that she never overdosed, her husband -according to her took too many meds.   Doxepin side effect does include urinary retention.  Will get basic labs, bladder scan and EKG.   Reassessment: Per nursing staff, patient had postvoid residual of 200 cc.  This amount of volume is not indicative of acute urinary retention that is pathologic, and it does not mandate urinary Foley catheter placement.  Will get basic labs to make  sure there is no evidence of any urinary failure.   10 15 PM Patient is medically cleared at this time.    Final Clinical Impression(s) / ED Diagnoses Final diagnoses:  Suicidal ideation    Rx / DC Orders ED Discharge Orders     None         Derwood Kaplan, MD 02/27/23 2322    Derwood Kaplan, MD 02/27/23 2322    Derwood Kaplan, MD 02/28/23 [id number]

## 2023-02-27 NOTE — ED Triage Notes (Signed)
Coming from [facility] went for having SI thoughts. [facility] sent pt here for not being able to urinate since Sunday afternoon. Pt states that she has a ton of medications at home that she could OD on at home and has had an SI attempt in the past.  Attempted to get pt. Undressed and explain the process and pt states she will just leave she is not doing all that
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
## 2023-02-28 ENCOUNTER — Inpatient Hospital Stay (HOSPITAL_COMMUNITY)
Admission: AD | Admit: 2023-02-28 | Discharge: 2023-03-03 | DRG: 885 | Disposition: A | Discharge status: Home / Self Care | Source: Intra-hospital | Attending: Psychiatry | Admitting: Psychiatry

## 2023-02-28 ENCOUNTER — Other Ambulatory Visit: Payer: Self-pay

## 2023-02-28 ENCOUNTER — Encounter (HOSPITAL_COMMUNITY): Payer: Self-pay | Admitting: Psychiatry

## 2023-02-28 DIAGNOSIS — Z9151 Personal history of suicidal behavior: Secondary | ICD-10-CM | POA: Diagnosis not present

## 2023-02-28 DIAGNOSIS — I110 Hypertensive heart disease with heart failure: Secondary | ICD-10-CM | POA: Diagnosis present

## 2023-02-28 DIAGNOSIS — F411 Generalized anxiety disorder: Secondary | ICD-10-CM | POA: Diagnosis present

## 2023-02-28 DIAGNOSIS — R45851 Suicidal ideations: Secondary | ICD-10-CM | POA: Diagnosis present

## 2023-02-28 DIAGNOSIS — F17210 Nicotine dependence, cigarettes, uncomplicated: Secondary | ICD-10-CM | POA: Diagnosis present

## 2023-02-28 DIAGNOSIS — F4310 Post-traumatic stress disorder, unspecified: Secondary | ICD-10-CM | POA: Diagnosis present

## 2023-02-28 DIAGNOSIS — E1140 Type 2 diabetes mellitus with diabetic neuropathy, unspecified: Secondary | ICD-10-CM | POA: Diagnosis present

## 2023-02-28 DIAGNOSIS — T1491XA Suicide attempt, initial encounter: Secondary | ICD-10-CM | POA: Insufficient documentation

## 2023-02-28 DIAGNOSIS — Z818 Family history of other mental and behavioral disorders: Secondary | ICD-10-CM

## 2023-02-28 DIAGNOSIS — R12 Heartburn: Secondary | ICD-10-CM | POA: Diagnosis present

## 2023-02-28 DIAGNOSIS — F3130 Bipolar disorder, current episode depressed, mild or moderate severity, unspecified: Secondary | ICD-10-CM | POA: Diagnosis not present

## 2023-02-28 DIAGNOSIS — F909 Attention-deficit hyperactivity disorder, unspecified type: Secondary | ICD-10-CM | POA: Diagnosis present

## 2023-02-28 DIAGNOSIS — Z0279 Encounter for issue of other medical certificate: Secondary | ICD-10-CM | POA: Diagnosis not present

## 2023-02-28 DIAGNOSIS — Z79899 Other long term (current) drug therapy: Secondary | ICD-10-CM

## 2023-02-28 DIAGNOSIS — R4581 Low self-esteem: Secondary | ICD-10-CM | POA: Diagnosis present

## 2023-02-28 LAB — RAPID URINE DRUG SCREEN, HOSP PERFORMED
Amphetamines: POSITIVE — AB
Barbiturates: NOT DETECTED
Benzodiazepines: NOT DETECTED
Cocaine: NOT DETECTED
Opiates: POSITIVE — AB
Tetrahydrocannabinol: NOT DETECTED

## 2023-02-28 LAB — CBG MONITORING, ED
Glucose-Capillary: 100 mg/dL — ABNORMAL HIGH (ref 70–99)
Glucose-Capillary: 112 mg/dL — ABNORMAL HIGH (ref 70–99)
Glucose-Capillary: 89 mg/dL (ref 70–99)

## 2023-02-28 LAB — URINALYSIS, ROUTINE W REFLEX MICROSCOPIC
Bilirubin Urine: NEGATIVE
Glucose, UA: NEGATIVE mg/dL
Hgb urine dipstick: NEGATIVE
Ketones, ur: NEGATIVE mg/dL
Leukocytes,Ua: NEGATIVE
Nitrite: NEGATIVE
Protein, ur: 30 mg/dL — AB
Specific Gravity, Urine: 1.036 — ABNORMAL HIGH (ref 1.005–1.030)
pH: 5 (ref 5.0–8.0)

## 2023-02-28 MED ORDER — ACETAMINOPHEN 325 MG PO TABS: 650.0000 mg | ORAL_TABLET | ORAL | Status: DC | PRN

## 2023-02-28 MED ORDER — OXYCODONE HCL 5 MG PO TABS
15.0000 mg | ORAL_TABLET | ORAL | Status: DC | PRN
  Administered 2023-02-28 – 2023-03-03 (×15): 15 mg via ORAL
  Filled 2023-02-28 (×15): qty 3

## 2023-02-28 MED ORDER — NICOTINE 21 MG/24HR TD PT24
21.0000 mg | MEDICATED_PATCH | Freq: Every day | TRANSDERMAL | Status: DC
  Administered 2023-02-28: 21 mg via TRANSDERMAL
  Filled 2023-02-28: qty 1

## 2023-02-28 MED ORDER — DIPHENHYDRAMINE HCL 25 MG PO CAPS: 50.0000 mg | ORAL_CAPSULE | Freq: Three times a day (TID) | ORAL | Status: DC | PRN

## 2023-02-28 MED ORDER — FAMOTIDINE 40 MG PO TABS
40.0000 mg | ORAL_TABLET | Freq: Every evening | ORAL | Status: DC
  Administered 2023-03-01 – 2023-03-02 (×2): 40 mg via ORAL
  Filled 2023-02-28 (×4): qty 1

## 2023-02-28 MED ORDER — PRAVASTATIN SODIUM 20 MG PO TABS
20.0000 mg | ORAL_TABLET | Freq: Every evening | ORAL | Status: DC
  Administered 2023-03-01 – 2023-03-02 (×2): 20 mg via ORAL
  Filled 2023-02-28 (×4): qty 1

## 2023-02-28 MED ORDER — DIPHENHYDRAMINE HCL 50 MG/ML IJ SOLN: 50.0000 mg | Freq: Three times a day (TID) | INTRAMUSCULAR | Status: DC | PRN

## 2023-02-28 MED ORDER — NICOTINE 21 MG/24HR TD PT24
21.0000 mg | MEDICATED_PATCH | Freq: Every day | TRANSDERMAL | Status: DC
  Administered 2023-03-01 – 2023-03-03 (×3): 21 mg via TRANSDERMAL
  Filled 2023-02-28 (×6): qty 1

## 2023-02-28 MED ORDER — AMPHETAMINE-DEXTROAMPHET ER 10 MG PO CP24
20.0000 mg | ORAL_CAPSULE | Freq: Every day | ORAL | Status: DC
  Administered 2023-02-28: 20 mg via ORAL
  Filled 2023-02-28: qty 1
  Filled 2023-02-28: qty 2

## 2023-02-28 MED ORDER — ZOLPIDEM TARTRATE 5 MG PO TABS
5.0000 mg | ORAL_TABLET | Freq: Every evening | ORAL | Status: DC | PRN
  Administered 2023-02-28 – 2023-03-02 (×3): 5 mg via ORAL
  Filled 2023-02-28 (×3): qty 1

## 2023-02-28 MED ORDER — LISINOPRIL 2.5 MG PO TABS
2.5000 mg | ORAL_TABLET | Freq: Every day | ORAL | Status: DC
  Administered 2023-03-01 – 2023-03-03 (×2): 2.5 mg via ORAL
  Filled 2023-02-28 (×5): qty 1

## 2023-02-28 MED ORDER — OXYCODONE HCL 5 MG PO TABS
15.0000 mg | ORAL_TABLET | ORAL | Status: DC | PRN
  Administered 2023-02-28 (×2): 15 mg via ORAL
  Filled 2023-02-28 (×2): qty 3

## 2023-02-28 MED ORDER — AMPHETAMINE-DEXTROAMPHET ER 10 MG PO CP24
20.0000 mg | ORAL_CAPSULE | Freq: Every day | ORAL | Status: DC
  Administered 2023-03-01: 20 mg via ORAL
  Filled 2023-02-28: qty 2

## 2023-02-28 MED ORDER — CYCLOBENZAPRINE HCL 10 MG PO TABS
10.0000 mg | ORAL_TABLET | Freq: Three times a day (TID) | ORAL | Status: DC | PRN
  Administered 2023-02-28: 10 mg via ORAL
  Filled 2023-02-28: qty 1

## 2023-02-28 MED ORDER — ONDANSETRON HCL 4 MG PO TABS: 4.0000 mg | ORAL_TABLET | Freq: Three times a day (TID) | ORAL | Status: DC | PRN

## 2023-02-28 MED ORDER — PREGABALIN 100 MG PO CAPS
300.0000 mg | ORAL_CAPSULE | Freq: Two times a day (BID) | ORAL | Status: DC
  Administered 2023-02-28 – 2023-03-03 (×6): 300 mg via ORAL
  Filled 2023-02-28 (×6): qty 3

## 2023-02-28 MED ORDER — LURASIDONE HCL 40 MG PO TABS
40.0000 mg | ORAL_TABLET | Freq: Every day | ORAL | Status: DC
  Administered 2023-03-01 – 2023-03-03 (×3): 40 mg via ORAL
  Filled 2023-02-28 (×6): qty 1

## 2023-02-28 NOTE — ED Notes (Addendum)
Pt verbally aggressive w/ sitter. 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 Pt wants to leave. EDP at bedside.

## 2023-02-28 NOTE — ED Notes (Addendum)
Pt continues to be verbally aggressive w/ staff, including this RN. Pt insulting this RN and accusing this RN of having an attitude and that I should think of where I'm working. Explained to pt that I apologize if it came across that I have an attitude and that it wasn't my intention. Pt continued to be verbally aggressive.

## 2023-02-28 NOTE — ED Provider Notes (Signed)
Emergency Medicine Observation Re-evaluation Note  Nancy [name] is a 37 y.o. female, seen on rounds today.  Pt initially presented to the ED for complaints of Suicide Attempt Currently, the patient is asleep in bed.  Physical Exam  BP 116/77 (BP Location: Left Arm)   Pulse 86   Temp 97.6 F (36.4 C) (Oral)   Resp 19   Ht 5\' 7"  (1.702 m)   Wt (!) 142.9 kg   SpO2 97%   BMI 49.34 kg/m  Physical Exam General: Asleep, no acute distress Cardiac: Regular rate Lungs: No increased work of breathing Psych: Calm, sleep  ED Course / MDM  EKG:   I have reviewed the labs performed to date as well as medications administered while in observation.  Recent changes in the last 24 hours include patient medically cleared, recommended for inpatient psych, pending acceptance.  Plan  Current plan is for inpatient psych, pending acceptance.    Nancy Maus, DO 02/28/23 [phone]

## 2023-02-28 NOTE — Progress Notes (Signed)
Admission Note: Patient is a 37 year old female admitted to the unit voluntarily for suicidal thoughts with no specific plans and PTSD.  Patient is alert and oriented x 4.  Patient presents with a flat affect and depressed mood.  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  Stated goal is to stop having SI thoughts and stability.  Admission plan of care reviewed, consent signed.  Skin assessment and personal belongings completed.  Skin is dry and intact with dark discolorations around skin.  Continuous glucose monitoring disk on left abdomen.  No contraband noted.  Routine safety checks initiated.  Verbalizes understanding of unit rules/protocols.  Patient ambulatory with a rolling walker due to unsteady gait.  Patient is safe on the unit.

## 2023-02-28 NOTE — ED Notes (Signed)
Assumed pt care at this time

## 2023-02-28 NOTE — Tx Team (Signed)
Initial Treatment Plan 02/28/2023 10:54 PM [name] NWG:[id number]    PATIENT STRESSORS: Health problems   Marital or family conflict     PATIENT STRENGTHS: Ability for insight  Communication skills  Motivation for treatment/growth  Supportive family/friends    PATIENT IDENTIFIED PROBLEMS: "Stability"  "To stop SI thoughts"  Depression  Anxiety  Diabetes             DISCHARGE CRITERIA:  Ability to meet basic life and health needs Adequate post-discharge living arrangements Motivation to continue treatment in a less acute level of care  PRELIMINARY DISCHARGE PLAN: Attend aftercare/continuing care group Attend 12-step recovery group Outpatient therapy Return to previous living arrangement  PATIENT/FAMILY INVOLVEMENT: This treatment plan has been presented to and reviewed with the patient, Nancy [name], and/or family member.  The patient and family have been given the opportunity to ask questions and make suggestions.  Clarene Critchley, RN 02/28/2023, 10:54 PM

## 2023-02-28 NOTE — ED Notes (Signed)
Notified pt of need for UDS prior to being transferred to [facility]. Pt says she will try.

## 2023-02-28 NOTE — ED Notes (Signed)
Report received, assumed care of patient at this time.  

## 2023-02-28 NOTE — ED Notes (Signed)
Pt irritable about her pain meds not being scheduled and that her CBG isn't being checked. Pt stated, "I could have died in here" Notified EDP

## 2023-02-28 NOTE — Progress Notes (Addendum)
Pt was accepted to [facility] TODAY 02/28/2023; Bed assignment 307-1  Pt meets inpatient criteria per Dr. Phineas Inches, MD  Attending Physician will be Loreen Freud  Report can be called to: -Adult unit: [phone]  Pt can arrive after: nursing to coordinate.  Care Team notified: Joaquim Nam, Night [facility] Kim Brooks,RN, Cindy Jerre Simon Dixon,RN, Bascom Levels, eElizabeth Baxter Flattery, Jacquelyn [place], Day [facility] Rosey Bath, RN   [place], [place] 02/28/2023 @ 9:49 PM

## 2023-02-28 NOTE — Progress Notes (Signed)
Patient in room eating a sandwich tray.  Alert and oriented. C/O back and leg pain 8/10.  See MAR. Will continue to monitor.  No distress noted.  Patient notified sister of admission.  Physician notified of need for wheelchair/walker order.  PT to be ordered.

## 2023-02-28 NOTE — ED Notes (Signed)
Called lab, per lab they will add the UA onto the previous sample.

## 2023-03-01 DIAGNOSIS — T1491XA Suicide attempt, initial encounter: Secondary | ICD-10-CM

## 2023-03-01 LAB — GLUCOSE, CAPILLARY
Glucose-Capillary: 122 mg/dL — ABNORMAL HIGH (ref 70–99)
Glucose-Capillary: 122 mg/dL — ABNORMAL HIGH (ref 70–99)
Glucose-Capillary: 104 mg/dL — ABNORMAL HIGH (ref 70–99)
Glucose-Capillary: 127 mg/dL — ABNORMAL HIGH (ref 70–99)

## 2023-03-01 MED ORDER — INSULIN ASPART 100 UNIT/ML IJ SOLN: 0.0000 [IU] | Freq: Every day | INTRAMUSCULAR | Status: DC

## 2023-03-01 MED ORDER — LORAZEPAM 1 MG PO TABS
1.0000 mg | ORAL_TABLET | Freq: Two times a day (BID) | ORAL | Status: DC
  Administered 2023-03-01 – 2023-03-03 (×4): 1 mg via ORAL
  Filled 2023-03-01 (×4): qty 1

## 2023-03-01 MED ORDER — INSULIN ASPART 100 UNIT/ML IJ SOLN
0.0000 [IU] | Freq: Three times a day (TID) | INTRAMUSCULAR | Status: DC
  Administered 2023-03-01: 1 [IU] via SUBCUTANEOUS
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
## 2023-03-01 NOTE — [facility] Group Notes (Signed)
[facility] Group Notes:  (Nursing/MHT/Case Management/Adjunct)  Date:  03/01/2023  Time:  9:12 PM  Type of Therapy:   Wrap-up group  Participation Level:  None  Participation Quality:  Resistant  Affect:  Not Congruent  Cognitive:  Lacking  Insight:  Lacking  Engagement in Group:  None  Modes of Intervention:  Education  Summary of Progress/Problems: Pt left the group room before group started.   Noah Delaine 03/01/2023, 9:12 PM
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
## 2023-03-01 NOTE — Group Note (Signed)
Date:  03/01/2023 Time:  3:19 PM  Group Topic/Focus:  Developing a Wellness Toolbox:   The focus of this group is to help patients develop a "wellness toolbox" with skills and strategies to promote recovery upon discharge. Dimensions of Wellness:   The focus of this group is to introduce the topic of wellness and discuss the role each dimension of wellness plays in total health.    Participation Level:  Did Not Attend  Participation Quality:   did not attend  Engagement in Group:  Did not attend  Modes of Intervention:   Did not attend  Additional Comments:  n/a   Cherre Blanc 03/01/2023, 3:19 PM

## 2023-03-01 NOTE — Inpatient Diabetes Management (Signed)
Inpatient Diabetes Program Recommendations  AACE/ADA: New Consensus Statement on Inpatient Glycemic Control (2015)  Target Ranges:  Prepandial:   less than 140 mg/dL      Peak postprandial:   less than 180 mg/dL (1-2 hours)      Critically ill patients:  140 - 180 mg/dL   Lab Results  Component Value Date   GLUCAP 122 (H) 03/01/2023   HGBA1C 11.8 (H) 10/02/2020    Review of Glycemic Control  Diabetes history: DM2 Outpatient Diabetes medications: Ozempic & Dexcom Current orders for Inpatient glycemic control: CBGs  DM coordinator received referral for admission to [facility].  She is followed by [facility] for her diabetes.  Glucose is trending well thus far.    Will continue to follow while inpatient.  Thank you, Nancy Sellar, MSN, CDCES Diabetes Coordinator Inpatient Diabetes Program [phone] (team pager from 8a-5p)

## 2023-03-01 NOTE — Progress Notes (Signed)
Sent chat to PA for orders for patient to use wheelchair from home and an order to check CBG's.

## 2023-03-01 NOTE — Progress Notes (Signed)
Patient needs an order to use rolling walker from home.  Will inform day nurse.
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
## 2023-03-02 LAB — GLUCOSE, CAPILLARY
Glucose-Capillary: 94 mg/dL (ref 70–99)
Glucose-Capillary: 137 mg/dL — ABNORMAL HIGH (ref 70–99)
Glucose-Capillary: 81 mg/dL (ref 70–99)

## 2023-03-02 NOTE — Progress Notes (Signed)
PATIENT SIGNED 72 HR REQUEST FOR DISCHARGE ON   03/02/2023   AT  1020.

## 2023-03-02 NOTE — [facility] Suicide Risk Assessment (Signed)
[facility] INPATIENT:  Family/Significant Other Suicide Prevention Education  Suicide Prevention Education:  Contact Attempts: sister [name] [phone],, (name of family member/significant other) has been identified by the patient as the family member/significant other with whom the patient will be residing, and identified as the person(s) who will aid the patient in the event of a mental health crisis.  With written consent from the patient, two attempts were made to provide suicide prevention education, prior to and/or following the patient's discharge.  We were unsuccessful in providing suicide prevention education.  A suicide education pamphlet was given to the patient to share with family/significant other.  Date and time of first attempt:  03/02/2023  / 3:11 PM  (HIPAA-compliant voicemail left) Date and time of second attempt:  To be done by CSW team  Lynnell Chad 03/02/2023, 3:10 PM

## 2023-03-02 NOTE — Group Note (Signed)
LCSW Group Therapy Note   Group Date: 03/02/2023 Start Time: 1000 End Time: 1100   Type of Therapy and Topic:  Group Therapy: Gratitude  Participation Level:  minimal   Description of Group:   In this group, patients shared and discussed the importance of acknowledging the elements in their lives for which they are grateful and how this can positively impact their mood.  The group discussed how bringing the positive elements of their lives to the forefront of their minds can help with recovery from any illness, physical or mental.  An Gratitude exercise was done as a group in which a list was made of songs used to  encourage participants to consider other potential positives in their lives.  Therapeutic Goals: Patients will identify one or more item for which they are grateful in each of 6 categories:  people, experiences, things, places, skills, and other. Patients will discuss how it is possible to seek out gratitude in even bad situations. Patients will explore other possible items of gratitude that they could remember.   Summary of Patient Progress:  The patient arrived at the end of group. Patient's reaction to the group was very much engaged. Pt shared that she was grateful for the realization that this does not have to be the way it is, made it through 100% of worst days, being on this side of the ground  Therapeutic Modalities:   Solution-Focused Therapy Activity  Steffanie Dunn, LCSWA 03/02/2023  1:52 PM

## 2023-03-02 NOTE — Progress Notes (Addendum)
[facility] MD Progress Note  03/02/2023 9:48 AM Nancy [name]  MRN:  [id number] Subjective:  Nancy [name] stated " I am in a bad headspace mentally."  Nancy [name] 37 year old female was admitted due to suicidal ideations and worsening depression.  Patient was seen and evaluated face-to-face by this provider.  Patient observed sitting on rollator at the medication window.  Continues to endorse passive suicidal thoughts denying plan or intent.  Reports frustration regarding staff members.  States she and the registered nurse on yesterday did not see "eye to eye".  States she was triggered by things that were bought up in group on yesterday.  She continues to have ongoing ruminations related to outside stressors.    Nancy [name] reports she did not rest well due to her Ambien being decreased from Amnien 10 mg to 5 mg.  Patient reported that "  I woke up at 2 AM."  Discussed following up with nursing staff for hydroxyzine as needed.  She appeared receptive to plan. Nursing documentation has 8.5 hours rested.   Nancy [name] expressed frustration related to Adderall being discontinued while inpatient. "  Just let me sign a 72-hour request to discharge."  Patient appears medication focus as she has requested to have her oxycodone 15 mg scheduled every 4 hours.  Education was provided with as needed medications.  Discussed following up with intensive and/or partial hospitalization programming at discharge.  Patient appeared receptive to plan.  She is currently rating her depression and anxiety 10 out of 10.  Reports a decreased appetite.  Staff to continue to monitor for safety.  Support, encouragement and  reassurance was provided.  Darin Redmann  is alert/oriented x 4; calm/cooperative; and mood congruent with affect.  Patient is speaking in a clear tone at moderate volume, and normal pace; with fair eye contact. Her thought process is coherent and relevant; There is no indication that she is currently responding to  internal/external stimuli or experiencing delusional thought content.  Patient denies  psychosis, and paranoia.    Principal Problem: Suicide attempt [facility]) Diagnosis: Principal Problem:   Suicide attempt (HCC)  Total Time spent with patient: 15 minutes  Past Psychiatric History: Suicide attempts, generalized anxiety disorder, bipolar 1 disorder and major depressive disorder.  Past Medical History:  Past Medical History:  Diagnosis Date   Anxiety    Bipolar disorder (HCC)    CHF (congestive heart failure) (HCC)    CIN I (cervical intraepithelial neoplasia I)    Diabetes mellitus without complication (HCC)    Enlarged heart    Nephrolithiasis    OSA (obstructive sleep apnea)    Tachycardia     Past Surgical History:  Procedure Laterality Date   BLADDER AUGMENTATION  2001   enlargement   DILITATION & CURRETTAGE/HYSTROSCOPY WITH THERMACHOICE ABLATION N/A 07/06/2013   Procedure: DILATATION & CURETTAGE/HYSTEROSCOPY WITH THERMACHOICE ABLATION;  Surgeon: Tilda Burrow, MD;  Location: [facility];  Service: Gynecology;  Laterality: N/A;  total therapt time- 9 minutes 2 seconds; 87C   LAPAROSCOPIC BILATERAL SALPINGECTOMY Bilateral 07/06/2013   Procedure: LAPAROSCOPIC BILATERAL SALPINGECTOMY;  Surgeon: Tilda Burrow, MD;  Location: [facility];  Service: Gynecology;  Laterality: Bilateral;   Family History:  Family History  Problem Relation Age of Onset   Hypertension Mother    Depression Mother    Diabetes Mother    Cancer Mother        brain tumor   Hypertension Father    Diabetes Father    Depression Father    Hypertension Sister  Diabetes Sister    Depression Sister    Depression Brother    Diabetes Brother    Hypertension Brother    Cancer Maternal Aunt    Cancer Paternal Aunt    Hypertension Maternal Grandmother    Diabetes Maternal Grandmother    Depression Maternal Grandmother    Hypertension Maternal Grandfather    Diabetes Maternal Grandfather    Depression Maternal  Grandfather    Hypertension Paternal Grandmother    Diabetes Paternal Grandmother    Depression Paternal Grandmother    Hypertension Paternal Grandfather    Diabetes Paternal Grandfather    Depression Paternal Grandfather    Family Psychiatric  History:  Social History:  Social History   Substance and Sexual Activity  Alcohol Use Yes   Comment: occasionally     Social History   Substance and Sexual Activity  Drug Use No    Social History   Socioeconomic History   Marital status: Married    Spouse name: Not on file   Number of children: Not on file   Years of education: Not on file   Highest education level: Not on file  Occupational History   Not on file  Tobacco Use   Smoking status: Every Day    Current packs/day: 1.50    Types: Cigarettes   Smokeless tobacco: Never  Vaping Use   Vaping status: Never Used  Substance and Sexual Activity   Alcohol use: Yes    Comment: occasionally   Drug use: No   Sexual activity: Not Currently  Other Topics Concern   Not on file  Social History Narrative   Left Handed   Two story home   Drinks caffeine    Social Determinants of Health   Financial Resource Strain: Low Risk  (04/12/2022)   Received from [facility]   Overall Financial Resource Strain (CARDIA)    Difficulty of Paying Living Expenses: Not hard at all  Food Insecurity: Patient Declined (02/28/2023)   Hunger Vital Sign    Worried About Running Out of Food in the Last Year: Patient declined    Ran Out of Food in the Last Year: Patient declined  Transportation Needs: Patient Declined (02/28/2023)   PRAPARE - Administrator, Civil Service (Medical): Patient declined    Lack of Transportation (Non-Medical): Patient declined  Physical Activity: Inactive (05/07/2021)   Received from [facility], [facility]   Exercise Vital Sign    Days of Exercise per Week: 0 days    Minutes of Exercise per Session: 0 min  Stress: Stress Concern  Present (04/12/2022)   Received from [facility], [facility]   [employer] of Occupational Health - Occupational Stress Questionnaire    Feeling of Stress : To some extent  Social Connections: Unknown (12/09/2021)   Received from [facility], [facility]   Social Network    Social Network: Not on file   Additional Social History:                         Sleep: Poor  Appetite:  Fair  Current Medications: Current Facility-Administered Medications  Medication Dose Route Frequency Provider Last Rate Last Admin   acetaminophen (TYLENOL) tablet 650 mg  650 mg Oral Q4H PRN Weber, Kyra A, NP       diphenhydrAMINE (BENADRYL) capsule 50 mg  50 mg Oral TID PRN Weber, Leavy Cella, NP       Or   diphenhydrAMINE (BENADRYL)  injection 50 mg  50 mg Intramuscular TID PRN Weber, Bella Kennedy A, NP       famotidine (PEPCID) tablet 40 mg  40 mg Oral QPM Weber, Kyra A, NP   40 mg at 03/01/23 1710   insulin aspart (novoLOG) injection 0-5 Units  0-5 Units Subcutaneous QHS Oneta Rack, NP       insulin aspart (novoLOG) injection 0-9 Units  0-9 Units Subcutaneous TID WC Oneta Rack, NP   1 Units at 03/01/23 1132   lisinopril (ZESTRIL) tablet 2.5 mg  2.5 mg Oral Daily Weber, Kyra A, NP   2.5 mg at 03/01/23 0813   LORazepam (ATIVAN) tablet 1 mg  1 mg Oral BID Oneta Rack, NP   1 mg at 03/02/23 0743   lurasidone (LATUDA) tablet 40 mg  40 mg Oral Q breakfast Weber, Kyra A, NP   40 mg at 03/02/23 0743   nicotine (NICODERM CQ - dosed in mg/24 hours) patch 21 mg  21 mg Transdermal Daily Weber, Kyra A, NP   21 mg at 03/02/23 0745   ondansetron (ZOFRAN) tablet 4 mg  4 mg Oral Q8H PRN Weber, Kyra A, NP       oxyCODONE (Oxy IR/ROXICODONE) immediate release tablet 15 mg  15 mg Oral Q4H PRN Weber, Kyra A, NP   15 mg at 03/02/23 0609   pravastatin (PRAVACHOL) tablet 20 mg  20 mg Oral QPM Weber, Kyra A, NP   20 mg at 03/01/23 1710   pregabalin (LYRICA) capsule 300 mg  300 mg Oral BID Weber, Kyra A, NP    300 mg at 03/02/23 0742   zolpidem (AMBIEN) tablet 5 mg  5 mg Oral QHS PRN Weber, Kyra A, NP   5 mg at 03/01/23 2107    Lab Results:  Results for orders placed or performed during the hospital encounter of 02/28/23 (from the past 48 hour(s))  Glucose, capillary     Status: Abnormal   Collection Time: 03/01/23  6:37 AM  Result Value Ref Range   Glucose-Capillary 122 (H) 70 - 99 mg/dL    Comment: Glucose reference range applies only to samples taken after fasting for at least 8 hours.  Glucose, capillary     Status: Abnormal   Collection Time: 03/01/23 11:28 AM  Result Value Ref Range   Glucose-Capillary 122 (H) 70 - 99 mg/dL    Comment: Glucose reference range applies only to samples taken after fasting for at least 8 hours.  Glucose, capillary     Status: Abnormal   Collection Time: 03/01/23  5:05 PM  Result Value Ref Range   Glucose-Capillary 104 (H) 70 - 99 mg/dL    Comment: Glucose reference range applies only to samples taken after fasting for at least 8 hours.  Glucose, capillary     Status: Abnormal   Collection Time: 03/01/23  8:41 PM  Result Value Ref Range   Glucose-Capillary 127 (H) 70 - 99 mg/dL    Comment: Glucose reference range applies only to samples taken after fasting for at least 8 hours.  Glucose, capillary     Status: None   Collection Time: 03/02/23  5:11 AM  Result Value Ref Range   Glucose-Capillary 94 70 - 99 mg/dL    Comment: Glucose reference range applies only to samples taken after fasting for at least 8 hours.    Blood Alcohol level:  Lab Results  Component Value Date   ETH <10 02/27/2023   ETH <10 03/12/2020  Metabolic Disorder Labs: Lab Results  Component Value Date   HGBA1C 11.8 (H) 10/02/2020   MPG 291.96 10/02/2020   MPG 372.32 04/02/2020   Lab Results  Component Value Date   PROLACTIN 12.7 08/29/2016   Lab Results  Component Value Date   CHOL 116 10/03/2020   TRIG 511 (H) 10/03/2020   HDL 20 (L) 10/03/2020   CHOLHDL 5.8  10/03/2020   VLDL UNABLE TO CALCULATE IF TRIGLYCERIDE OVER 400 mg/dL 47/42/5956   LDLCALC UNABLE TO CALCULATE IF TRIGLYCERIDE OVER 400 mg/dL 38/75/6433   LDLCALC 89 08/29/2016    Physical Findings: AIMS: Facial and Oral Movements Muscles of Facial Expression: None, normal Lips and Perioral Area: None, normal Jaw: None, normal Tongue: None, normal,Extremity Movements Upper (arms, wrists, hands, fingers): None, normal Lower (legs, knees, ankles, toes): None, normal, Trunk Movements Neck, shoulders, hips: None, normal, Overall Severity Severity of abnormal movements (highest score from questions above): None, normal Incapacitation due to abnormal movements: None, normal Patient's awareness of abnormal movements (rate only patient's report): No Awareness, Dental Status Current problems with teeth and/or dentures?: No Does patient usually wear dentures?: No  CIWA:    COWS:     Musculoskeletal: Strength & Muscle Tone: within normal limits Gait & Station: unsteady Patient leans: N/A patient observed using rollator for ambulation assistance  Psychiatric Specialty Exam:  Presentation  General Appearance:  Appropriate for Environment  Eye Contact: Good  Speech: Clear and Coherent  Speech Volume: Normal  Handedness: Right   Mood and Affect  Mood: Anxious; Depressed  Affect: Congruent   Thought Process  Thought Processes: Coherent  Descriptions of Associations:Intact  Orientation:Full (Time, Place and Person)  Thought Content:Logical  History of Schizophrenia/Schizoaffective disorder:No  Duration of Psychotic Symptoms:No data recorded Hallucinations:Hallucinations: None  Ideas of Reference:None  Suicidal Thoughts:Suicidal Thoughts: Yes, Passive SI Active Intent and/or Plan: With Intent; With Plan SI Passive Intent and/or Plan: Without Intent; Without Plan  Homicidal Thoughts:Homicidal Thoughts: No   Sensorium  Memory: Immediate Fair; Recent Fair;  Remote Fair  Judgment: Poor  Insight: Fair   Chartered certified accountant: Fair  Attention Span: Fair  Recall: Good  Fund of Knowledge: Fair  Language: Fair   Psychomotor Activity  Psychomotor Activity: Psychomotor Activity: Other (comment) (Rollator for ambulation assistance)   Assets  Assets: Desire for Improvement; Social Support   Sleep  Sleep: Sleep: Fair Number of Hours of Sleep: 5    Physical Exam: Physical Exam Vitals and nursing note reviewed.  Constitutional:      Appearance: Normal appearance.  Cardiovascular:     Rate and Rhythm: Normal rate and regular rhythm.  Neurological:     Mental Status: She is oriented to person, place, and time.  Psychiatric:        Mood and Affect: Mood normal.        Behavior: Behavior normal.        Thought Content: Thought content normal.    Review of Systems  Psychiatric/Behavioral:  Positive for suicidal ideas. The patient is nervous/anxious.   All other systems reviewed and are negative.  Blood pressure 93/77, pulse (!) 125, temperature 98 F (36.7 C), temperature source Oral, resp. rate 18, height 5\' 7"  (1.702 m), weight (!) 144.7 kg, SpO2 100%. Body mass index is 49.96 kg/m.   Treatment Plan Summary: Daily contact with patient to assess and evaluate symptoms and progress in treatment and Medication management  Continue with current treatment plan on 03/02/2023 as listed below except were noted  Major depressive  disorder: Bipolar 1 disorder: Generalized anxiety disorder: Chronic prescription benzodiazepine use:  Continue Latuda 40 mg p.o. daily Continue Ativan 1 mg p.o. twice daily Continue Ambien 5 mg p.o. as needed nightly Continue Lyrica 300 mg p.o. twice daily Continue oxycodone 15 mg p.o. every 4 as needed moderate pain  CSW to continue working on discharge disposition -Follow-up with intensive outpatient programming and/or partial hospitalization programming at  discharge Patient encouraged to participate within the therapeutic milieu  Oneta Rack, NP 03/02/2023, 9:48 AM

## 2023-03-02 NOTE — Progress Notes (Addendum)
Patient stated she is not comfortable taking lisinopril because of low BP 93/77  P 125. Wants pain medicine scheduled instead of prn, wants every 4 hours scheduled.  That's all!  Patient also stated she has been in a "rough place mentally and physically", just not having any desire to do anything, issues with husband.  Will discuss with MD/SW.  Patient stated she needs her adderall that she has been "taking a very long time at home".

## 2023-03-02 NOTE — Plan of Care (Signed)
  Problem: Education: Goal: Knowledge of [facility] Education information/materials will improve Outcome: Progressing   Problem: Activity: Goal: Interest or engagement in activities will improve Outcome: Progressing   

## 2023-03-02 NOTE — Progress Notes (Addendum)
D:  Patient denied SI and HI this morning.  Denied A/V hallucinations.   A:  Medications administered per MD orders, except lisinopril.  Lisinopril because of  low BP 93/77. R:  15 minute safety checks per MD orders.  Emotional support and encouragements  Patient became upset because her Depakote was discontinued.  She had been taking Depakote 500 mg bid several years Patient discussed the medication with her MD.

## 2023-03-02 NOTE — B[facility] Group Notes (Signed)
[facility] Group Notes:  (Nursing)  Date:  03/02/2023  Time:  1400  Type of Therapy:  Psychoeducational Skills  Participation Level:  Did Not Attend   Shela Nevin 03/02/2023, 4:16 PM

## 2023-03-02 NOTE — Progress Notes (Signed)
Patient was given medication information for tonight, tomorrow morning.  Patient took oxycodone in 6 hours, not the 4 hours prn.

## 2023-03-02 NOTE — Plan of Care (Signed)
Nurse and MD discussed medications and anxiety with patient.

## 2023-03-03 ENCOUNTER — Encounter (HOSPITAL_COMMUNITY): Payer: Self-pay

## 2023-03-03 DIAGNOSIS — F3130 Bipolar disorder, current episode depressed, mild or moderate severity, unspecified: Principal | ICD-10-CM

## 2023-03-03 LAB — GLUCOSE, CAPILLARY
Glucose-Capillary: 93 mg/dL (ref 70–99)
Glucose-Capillary: 86 mg/dL (ref 70–99)
Glucose-Capillary: 135 mg/dL — ABNORMAL HIGH (ref 70–99)
Glucose-Capillary: 94 mg/dL (ref 70–99)

## 2023-03-03 MED ORDER — ZOLPIDEM TARTRATE 5 MG PO TABS: 5.0000 mg | ORAL_TABLET | Freq: Every evening | ORAL | 0 refills | Status: AC | PRN

## 2023-03-03 MED ORDER — NICOTINE 21 MG/24HR TD PT24: 21.0000 mg | MEDICATED_PATCH | Freq: Every day | TRANSDERMAL | 0 refills | Status: AC

## 2023-03-03 MED ORDER — LURASIDONE HCL 40 MG PO TABS: 40.0000 mg | ORAL_TABLET | Freq: Every day | ORAL | 0 refills | Status: AC

## 2023-03-03 NOTE — Group Note (Signed)
Recreation Therapy Group Note   Group Topic:Relaxation  Group Date: 03/03/2023 Start Time: 0945 End Time: 1005 Facilitators: Daymon Hora-McCall, LRT,CTRS Location: [address] Dayroom   Goal Area(s) Addresses:  Patient will identify positive stress management techniques. Patient will identify benefits of using stress management post d/c.  Group Description: Meditation. LRT discussed meditation with group. LRT then played a meditation that focused identifying those things that are holding you back and how to overcome them. Patients were to get as comfortable as possible and focus on the content of the meditation. LRT informed patients they could find other meditations and stress management techniques from apps, Youtube, scripts, etc.   Affect/Mood: N/A   Participation Level: Did not attend    Clinical Observations/Individualized Feedback:    Plan: Continue to engage patient in RT group sessions 2-3x/week.   Coco Sharpnack-McCall, LRT,CTRS  03/03/2023 12:06 PM

## 2023-03-03 NOTE — Progress Notes (Signed)
   03/02/23 2040  Psych Admission Type (Psych Patients Only)  Admission Status Voluntary  Psychosocial Assessment  Patient Complaints Anxiety;Depression;Sleep disturbance  Eye Contact Fair  Facial Expression Flat  Affect Depressed  Speech Logical/coherent  Interaction Assertive  Motor Activity Unsteady;Slow  Appearance/Hygiene Unremarkable  Behavior Characteristics Cooperative;Appropriate to situation  Mood Depressed;Anxious  Thought Process  Coherency WDL  Content WDL  Delusions None reported or observed  Perception WDL  Hallucination None reported or observed  Judgment WDL  Confusion None  Danger to Self  Current suicidal ideation? Denies  Self-Injurious Behavior No self-injurious ideation or behavior indicators observed or expressed   Agreement Not to Harm Self Yes  Description of Agreement Verbal contract  Danger to Others  Danger to Others None reported or observed

## 2023-03-03 NOTE — Progress Notes (Signed)
   03/03/23 1225  Psych Admission Type (Psych Patients Only)  Admission Status Voluntary  Psychosocial Assessment  Patient Complaints None  Eye Contact Fair  Facial Expression Flat  Affect Appropriate to circumstance  Speech Logical/coherent  Interaction Assertive  Motor Activity Slow;Unsteady  Appearance/Hygiene Unremarkable  Behavior Characteristics Anxious  Mood Anxious  Thought Process  Coherency WDL  Content WDL  Delusions None reported or observed  Perception WDL  Hallucination None reported or observed  Judgment WDL  Confusion None  Danger to Self  Current suicidal ideation? Denies  Self-Injurious Behavior No self-injurious ideation or behavior indicators observed or expressed   Agreement Not to Harm Self Yes  Description of Agreement verbal  Danger to Others  Danger to Others None reported or observed

## 2023-03-03 NOTE — BHH Group Notes (Signed)
Adult Psychoeducational Group Note  Date:  03/03/2023 Time:  11:47 AM  Group Topic/Focus:  Goals Group:   The focus of this group is to help patients establish daily goals to achieve during treatment and discuss how the patient can incorporate goal setting into their daily lives to aide in recovery. Managing Feelings:   The focus of this group is to identify what feelings patients have difficulty handling and develop a plan to handle them in a healthier way upon discharge.  Participation Level:  Active  Participation Quality:  Attentive  Affect:  Appropriate  Cognitive:  Appropriate  Insight: Appropriate  Engagement in Group:  Engaged  Modes of Intervention:  Discussion, Education, and Exploration  Additional Comments:  Pt participated in group. Pt stated her goal is to "get through the day."  Pt actively listened as group discussed self-worth, self-awareness. Pt's discussed the barriers faced when discharging along with the emotions, feelings and the uncertainty.       Lorece Keach 03/03/2023, 11:47 AM

## 2023-03-03 NOTE — BHH Suicide Risk Assessment (Signed)
B[facility] INPATIENT:  Family/Significant Other Suicide Prevention Education  Suicide Prevention Education:  Education Completed; Nancy [name] (sister) [phone],  (name of family member/significant other) has been identified by the patient as the family member/significant other with whom the patient will be residing, and identified as the person(s) who will aid the patient in the event of a mental health crisis (suicidal ideations/suicide attempt).  With written consent from the patient, the family member/significant other has been provided the following suicide prevention education, prior to the and/or following the discharge of the patient.  Sister confirmed that all guns / weapons have been locked away and patient does not own not have access to any.   The suicide prevention education provided includes the following: Suicide risk factors Suicide prevention and interventions National Suicide Hotline telephone number [facility] assessment telephone number [facility] Emergency Assistance 911 [facility] and/or Residential Mobile Crisis Unit telephone number  Request made of family/significant other to: Remove weapons (e.g., guns, rifles, knives), all items previously/currently identified as safety concern.   Remove drugs/medications (over-the-counter, prescriptions, illicit drugs), all items previously/currently identified as a safety concern.  The family member/significant other verbalizes understanding of the suicide prevention education information provided.  The family member/significant other agrees to remove the items of safety concern listed above.  Nancy [name] 03/03/2023, 2:28 PM

## 2023-03-03 NOTE — Progress Notes (Signed)
  [facility] Adult Case Management Discharge Plan :  Will you be returning to the same living situation after discharge:  Yes,  patient will be returning to her sisters and then back home to husband  At discharge, do you have transportation home?: Yes,  CSW will contact Safe transport to pick patient up and take her back to her car  Do you have the ability to pay for your medications: Yes,  [facility]   Release of information consent forms completed and in the chart;  Patient's signature needed at discharge.  Patient to Follow up at:  Follow-up Information     Junie Panning, NP. Go on 04/01/2023.   Specialty: Nurse Practitioner Why: You have an appointment for medication management services on 04/01/23 at 8:30 am, in person. Contact information: [address] [place] [address] [phone]         BEHAVIORAL HEALTH PARTIAL HOSPITALIZATION PROGRAM Follow up on 03/07/2023.   Specialty: Behavioral Health Why: You are scheduled for an assessment for the Partial Hospitalization Program on 03/07/23 at 10:00 am. This appointment will last approximately one hour and will be virtual via Teams.  You will receive an email invite the day of the appointment. PHP is virtual group therapy that runs Mon-Fri from 9am-1pm. Please download the Microsoft Teams app prior to the appointment. If you need to cancel or reschedule, please call [phone] Contact information: [address] [place] [address] [phone]                Next level of care provider has access to [facility] Link:yes  Safety Planning and Suicide Prevention discussed: Yes,  [name] ( sister ) [phone]     Has patient been referred to the Quitline?: Patient refused referral for treatment  Patient has been referred for addiction treatment: No known substance use disorder.  Isabella Bowens, LCSWA 03/03/2023, 2:26 PM

## 2023-03-03 NOTE — BHH Suicide Risk Assessment (Addendum)
Suicide Risk Assessment  Discharge Assessment    [facility] Discharge Suicide Risk Assessment   Principal Problem: Bipolar I disorder, most recent episode depressed [facility]) Discharge Diagnoses: Principal Problem:   Bipolar I disorder, most recent episode depressed (HCC)  During the patient's hospitalization, patient had extensive initial psychiatric evaluation, and follow-up psychiatric evaluations every day.  Psychiatric diagnoses provided upon initial assessment: Bipolar Disorder, GAD, PTSD  Patient's psychiatric medications were adjusted on admission: She was continued on her home Pristiq, Lyrica, Ambien, and Latuda.  During the hospitalization, other adjustments were made to the patient's psychiatric medication regimen: No adjustments were made.  Gradually, patient started adjusting to milieu.   Patient's care was discussed during the interdisciplinary team meeting every day during the hospitalization.  The patient is not having side effects to prescribed psychiatric medication.  The patient reports their target psychiatric symptoms of depression and SI responded well to the psychiatric medications, and the patient reports overall benefit other psychiatric hospitalization. Supportive psychotherapy was provided to the patient. The patient also participated in regular group therapy while admitted.   Labs were reviewed with the patient, and abnormal results were discussed with the patient.  The patient denied having suicidal thoughts more than 48 hours prior to discharge.  Patient denies having homicidal thoughts.  Patient denies having auditory hallucinations.  Patient denies any visual hallucinations.  Patient denies having paranoid thoughts.  The patient is able to verbalize their individual safety plan to this provider.  It is recommended to the patient to continue psychiatric medications as prescribed, after discharge from the hospital.    It is recommended to the patient to follow up  with your outpatient psychiatric provider and PCP.  Discussed with the patient, the impact of alcohol, drugs, tobacco have been there overall psychiatric and medical wellbeing, and total abstinence from substance use was recommended the patient.  Total Time spent with patient: 20 minutes  Musculoskeletal: Strength & Muscle Tone: within normal limits Gait & Station: unsteady Patient leans: N/A  Psychiatric Specialty Exam  Presentation  General Appearance:  Appropriate for Environment; Casual  Eye Contact: Good  Speech: Clear and Coherent; Normal Rate  Speech Volume: Normal  Handedness: Right   Mood and Affect  Mood: Dysphoric (frustrated)  Duration of Depression Symptoms: Greater than two weeks  Affect: Congruent   Thought Process  Thought Processes: Coherent; Goal Directed  Descriptions of Associations:Intact  Orientation:Full (Time, Place and Person)  Thought Content:Logical; WDL  History of Schizophrenia/Schizoaffective disorder:No  Duration of Psychotic Symptoms:No data recorded Hallucinations:Hallucinations: None  Ideas of Reference:None  Suicidal Thoughts:Suicidal Thoughts: No SI Passive Intent and/or Plan: Without Intent; Without Plan  Homicidal Thoughts:Homicidal Thoughts: No   Sensorium  Memory: Immediate Fair; Recent Fair  Judgment: Fair  Insight: Fair   Executive Functions  Concentration: Good  Attention Span: Good  Recall: Good  Fund of Knowledge: Good  Language: Good   Psychomotor Activity  Psychomotor Activity: Psychomotor Activity: Normal   Assets  Assets: Communication Skills; Desire for Improvement; Resilience; Social Support; Housing   Sleep  Sleep: Sleep: Fair Number of Hours of Sleep: 5   Physical Exam: Physical Exam Vitals and nursing note reviewed.  Constitutional:      General: She is not in acute distress.    Appearance: Normal appearance. She is obese. She is not ill-appearing or  toxic-appearing.  HENT:     Head: Normocephalic and atraumatic.  Pulmonary:     Effort: Pulmonary effort is normal.  Neurological:     Mental  Status: She is alert.    Review of Systems  Respiratory:  Negative for cough and shortness of breath.   Cardiovascular:  Negative for chest pain.  Gastrointestinal:  Negative for abdominal pain, constipation, diarrhea, nausea and vomiting.  Neurological:  Negative for dizziness, weakness and headaches.  Psychiatric/Behavioral:  Negative for depression, hallucinations and suicidal ideas. The patient is not nervous/anxious.    Blood pressure (!) 147/96, pulse (!) 104, temperature 98.7 F (37.1 C), temperature source Oral, resp. rate 14, height 5\' 7"  (1.702 m), weight (!) 144.7 kg, SpO2 100%. Body mass index is 49.96 kg/m.  Mental Status Per Nursing Assessment::   On Admission:  Self-harm thoughts  Demographic Factors:  Low socioeconomic status  Loss Factors: Decline in physical health and Financial problems/change in socioeconomic status  Historical Factors: Anniversary of important loss  Risk Reduction Factors:   Living with another person, especially a relative and Positive social support  Continued Clinical Symptoms:  Medical Diagnoses and Treatments/Surgeries  Cognitive Features That Contribute To Risk:  None    Suicide Risk:  Mild:  No Suicidal ideation.  There are no identifiable plans, no associated intent, mild dysphoria and related symptoms, good self-control (both objective and subjective assessment), few other risk factors, and identifiable protective factors, including available and accessible social support.  However, since she does have a history of prior suicide attempts there is some risk present.   Follow-up Information     Junie Panning, NP. Go on 04/01/2023.   Specialty: Nurse Practitioner Why: You have an appointment for medication management services on 04/01/23 at 8:30 am, in person. Contact information: [address] [place] [address] [phone]         BEHAVIORAL HEALTH PARTIAL HOSPITALIZATION PROGRAM Follow up on 03/07/2023.   Specialty: Behavioral Health Why: You are scheduled for an assessment for the Partial Hospitalization Program on 03/07/23 at 10:00 am. This appointment will last approximately one hour and will be virtual via Teams.  You will receive an email invite the day of the appointment. PHP is virtual group therapy that runs Mon-Fri from 9am-1pm. Please download the Microsoft Teams app prior to the appointment. If you need to cancel or reschedule, please call [phone] Contact information: [address] [place] [address] [phone]                Plan Of Care/Follow-up recommendations:  Activity: as tolerated  Diet: heart healthy  Other: -Follow-up with your outpatient psychiatric provider -instructions on appointment date, time, and address (location) are provided to you in discharge paperwork.  -Take your psychiatric medications as prescribed at discharge - instructions are provided to you in the discharge paperwork  -Follow-up with outpatient primary care doctor and other specialists -for management of chronic medical disease, including: Continued management for your chronic pain and diabetes.  -Testing: Follow-up with outpatient provider for abnormal lab results: None  -Recommend abstinence from alcohol, tobacco, and other illicit drug use at discharge.   -If your psychiatric symptoms recur, worsen, or if you have side effects to your psychiatric medications, call your outpatient psychiatric provider, 911, [phone] or go to the nearest emergency department.  -If suicidal thoughts recur, call your outpatient psychiatric provider, 911, [phone] or go to the nearest emergency department.   Lauro Franklin, MD 03/03/2023, 12:52 PM

## 2023-03-03 NOTE — Progress Notes (Signed)
   03/03/23 6045  15 Minute Checks  Location Dayroom  Visual Appearance Calm  Behavior Composed  Sleep (Behavioral Health Patients Only)  Calculate sleep? (Click Yes once per 24 hr at 0600 safety check) Yes  Documented sleep last 24 hours 4.5

## 2023-03-03 NOTE — Transportation (Signed)
03/03/2023  Nancy [name] DOB: June 28, 1986 MRN: [id number]   RIDER WAIVER AND RELEASE OF LIABILITY  For the purposes of helping with transportation needs, [place] partners with outside transportation providers (taxi companies, [place], Catering manager.) to give [employer] patients or other approved people the choice of on-demand rides [employer]") to our buildings for non-emergency visits.  By using [employer], I, the person signing this document, on behalf of myself and/or any legal minors (in my care using the [employer]), agree:  Science writer given to me are supplied by independent, outside transportation providers who do not work for, or have any affiliation with, [employer]. [place] is not a transportation company. [place] has no control over the quality or safety of the rides I get using [employer]. [place] has no control over whether any outside ride will happen on time or not. [place] [facility] gives no guarantee on the reliability, quality, safety, or availability on any rides, or that no mistakes will happen. I know and accept that traveling by vehicle (car, truck, [employer], Zenaida Niece, bus, taxi, etc.) has risks of serious injuries such as disability, being paralyzed, and death. I know and agree the risk of using [employer] is mine alone, and not [employer]. Transport Services are provided "as is" and as are available. The transportation providers are in charge for all inspections and care of the vehicles used to provide these rides. I agree not to take legal action against [facility], its agents, employees, officers, directors, representatives, insurers, attorneys, assigns, successors, subsidiaries, and affiliates at any time for any reasons related directly or indirectly to using [employer]. I also agree not to take legal action against [place] or its affiliates for any injury, death, or damage to property caused by or related to using  [employer]. I have read this Waiver and Release of Liability, and I understand the terms used in it and their legal meaning. This Waiver is freely and voluntarily given with the understanding that my right (or any legal minors) to legal action against [place] relating to [employer] is knowingly given up to use these services.   I attest that I read the Ride Waiver and Release of Liability to Nancy [name], gave Ms. Nosbisch the opportunity to ask questions and answered the questions asked (if any). I affirm that Nancy [name] then provided consent for assistance with transportation.

## 2023-03-03 NOTE — Progress Notes (Signed)
Pt was discharged alert and oriented x 4 and cooperative. Discharge instructions reviewed with pt and belongings returned to pt.

## 2023-03-03 NOTE — BH IP Treatment Plan (Signed)
Interdisciplinary Treatment and Diagnostic Plan Update  03/03/2023 Time of Session: 1030am Nancy [name] MRN: [id number]  Principal Diagnosis: Bipolar I disorder, most recent episode depressed (HCC)  Secondary Diagnoses: Principal Problem:   Bipolar I disorder, most recent episode depressed (HCC)   Current Medications:  Current Facility-Administered Medications  Medication Dose Route Frequency Provider Last Rate Last Admin   acetaminophen (TYLENOL) tablet 650 mg  650 mg Oral Q4H PRN Weber, Kyra A, NP       diphenhydrAMINE (BENADRYL) capsule 50 mg  50 mg Oral TID PRN Weber, Kyra A, NP       Or   diphenhydrAMINE (BENADRYL) injection 50 mg  50 mg Intramuscular TID PRN Weber, Kyra A, NP       famotidine (PEPCID) tablet 40 mg  40 mg Oral QPM Weber, Kyra A, NP   40 mg at 03/02/23 1816   insulin aspart (novoLOG) injection 0-5 Units  0-5 Units Subcutaneous QHS Oneta Rack, NP       insulin aspart (novoLOG) injection 0-9 Units  0-9 Units Subcutaneous TID WC Oneta Rack, NP   1 Units at 03/01/23 1132   lisinopril (ZESTRIL) tablet 2.5 mg  2.5 mg Oral Daily Weber, Kyra A, NP   2.5 mg at 03/03/23 0836   LORazepam (ATIVAN) tablet 1 mg  1 mg Oral BID Oneta Rack, NP   1 mg at 03/03/23 0835   lurasidone (LATUDA) tablet 40 mg  40 mg Oral Q breakfast Weber, Kyra A, NP   40 mg at 03/03/23 0835   nicotine (NICODERM CQ - dosed in mg/24 hours) patch 21 mg  21 mg Transdermal Daily Weber, Kyra A, NP   21 mg at 03/03/23 0836   ondansetron (ZOFRAN) tablet 4 mg  4 mg Oral Q8H PRN Weber, Kyra A, NP       oxyCODONE (Oxy IR/ROXICODONE) immediate release tablet 15 mg  15 mg Oral Q4H PRN Weber, Kyra A, NP   15 mg at 03/03/23 1246   pravastatin (PRAVACHOL) tablet 20 mg  20 mg Oral QPM Weber, Kyra A, NP   20 mg at 03/02/23 1816   pregabalin (LYRICA) capsule 300 mg  300 mg Oral BID Weber, Kyra A, NP   300 mg at 03/03/23 0835   zolpidem (AMBIEN) tablet 5 mg  5 mg Oral QHS PRN Weber, Kyra A, NP   5 mg at 03/02/23  2035   PTA Medications: Medications Prior to Admission  Medication Sig Dispense Refill Last Dose   amphetamine-dextroamphetamine (ADDERALL XR) 20 MG 24 hr capsule Take 20 mg by mouth daily.      cyclobenzaprine (FLEXERIL) 10 MG tablet Take 10 mg by mouth 3 (three) times daily as needed for muscle spasms.      desvenlafaxine (PRISTIQ) 50 MG 24 hr tablet Take 50 mg by mouth at bedtime.      EPINEPHrine 0.3 mg/0.3 mL IJ SOAJ injection Inject 0.3 mg into the muscle as needed for anaphylaxis.  (Patient not taking: Reported on 02/27/2023)      famotidine (PEPCID) 40 MG tablet Take 40 mg by mouth every evening.      lisinopril (ZESTRIL) 5 MG tablet Take 2.5 mg by mouth daily.      LORazepam (ATIVAN) 1 MG tablet Take 1 mg by mouth 2 (two) times daily as needed for anxiety.      lurasidone (LATUDA) 40 MG TABS tablet Take 40 mg by mouth daily with breakfast.      oxyCODONE (ROXICODONE) 15 MG  immediate release tablet Take 15 mg by mouth every 4 (four) hours as needed for pain.      pravastatin (PRAVACHOL) 20 MG tablet Take 20 mg by mouth every evening.      pregabalin (LYRICA) 300 MG capsule Take 300 mg by mouth 2 (two) times daily.      Semaglutide,0.25 or 0.5MG /DOS, (OZEMPIC, 0.25 OR 0.5 MG/DOSE,) 2 MG/3ML SOPN Inject 1.5 mg into the skin once a week. Wednesday       Patient Stressors: Health problems   Marital or family conflict    Patient Strengths: Ability for insight  Printmaker for treatment/growth  Supportive family/friends   Treatment Modalities: Medication Management, Group therapy, Case management,  1 to 1 session with clinician, Psychoeducation, Recreational therapy.   Physician Treatment Plan for Primary Diagnosis: Bipolar I disorder, most recent episode depressed (HCC) Long Term Goal(s): Improvement in symptoms so as ready for discharge   Short Term Goals: Ability to identify and develop effective coping behaviors will improve Ability to maintain clinical  measurements within normal limits will improve Ability to identify triggers associated with substance abuse/mental health issues will improve Ability to identify changes in lifestyle to reduce recurrence of condition will improve Ability to verbalize feelings will improve Ability to disclose and discuss suicidal ideas  Medication Management: Evaluate patient's response, side effects, and tolerance of medication regimen.  Therapeutic Interventions: 1 to 1 sessions, Unit Group sessions and Medication administration.  Evaluation of Outcomes: Progressing  Physician Treatment Plan for Secondary Diagnosis: Principal Problem:   Bipolar I disorder, most recent episode depressed (HCC)  Long Term Goal(s): Improvement in symptoms so as ready for discharge   Short Term Goals: Ability to identify and develop effective coping behaviors will improve Ability to maintain clinical measurements within normal limits will improve Ability to identify triggers associated with substance abuse/mental health issues will improve Ability to identify changes in lifestyle to reduce recurrence of condition will improve Ability to verbalize feelings will improve Ability to disclose and discuss suicidal ideas     Medication Management: Evaluate patient's response, side effects, and tolerance of medication regimen.  Therapeutic Interventions: 1 to 1 sessions, Unit Group sessions and Medication administration.  Evaluation of Outcomes: Progressing   RN Treatment Plan for Primary Diagnosis: Bipolar I disorder, most recent episode depressed (HCC) Long Term Goal(s): Knowledge of disease and therapeutic regimen to maintain health will improve  Short Term Goals: Ability to remain free from injury will improve, Ability to verbalize frustration and anger appropriately will improve, Ability to demonstrate self-control, Ability to participate in decision making will improve, Ability to verbalize feelings will improve, Ability  to disclose and discuss suicidal ideas, Ability to identify and develop effective coping behaviors will improve, and Compliance with prescribed medications will improve  Medication Management: RN will administer medications as ordered by provider, will assess and evaluate patient's response and provide education to patient for prescribed medication. RN will report any adverse and/or side effects to prescribing provider.  Therapeutic Interventions: 1 on 1 counseling sessions, Psychoeducation, Medication administration, Evaluate responses to treatment, Monitor vital signs and CBGs as ordered, Perform/monitor CIWA, COWS, AIMS and Fall Risk screenings as ordered, Perform wound care treatments as ordered.  Evaluation of Outcomes: Progressing   LCSW Treatment Plan for Primary Diagnosis: Bipolar I disorder, most recent episode depressed (HCC) Long Term Goal(s): Safe transition to appropriate next level of care at discharge, Engage patient in therapeutic group addressing interpersonal concerns.  Short Term Goals: Engage patient in aftercare  planning with referrals and resources, Increase social support, Increase ability to appropriately verbalize feelings, Increase emotional regulation, Facilitate acceptance of mental health diagnosis and concerns, Facilitate patient progression through stages of change regarding substance use diagnoses and concerns, Identify triggers associated with mental health/substance abuse issues, and Increase skills for wellness and recovery  Therapeutic Interventions: Assess for all discharge needs, 1 to 1 time with Social worker, Explore available resources and support systems, Assess for adequacy in community support network, Educate family and significant other(s) on suicide prevention, Complete Psychosocial Assessment, Interpersonal group therapy.  Evaluation of Outcomes: Progressing   Progress in Treatment: Attending groups: Yes. Participating in groups: Yes. Taking  medication as prescribed: Yes. Toleration medication: Yes. Family/Significant other contact made: Yes, individual(s) contacted:  [name] [phone], Patient understands diagnosis: Yes. Discussing patient identified problems/goals with staff: Yes. Medical problems stabilized or resolved: Yes. Denies suicidal/homicidal ideation: Yes. Issues/concerns per patient self-inventory: No. Other: no  New problem(s) identified: No, Describe:  none reported  New Short Term/Long Term Goal(s):medication management for mood stabilization; elimination of SI thoughts; development of comprehensive mental wellness  Patient Goals:  coping skills and medication management  Discharge Plan or Barriers: Patient recently admitted. CSW will continue to follow and assess for appropriate referrals and possible discharge planning.  Patient signed 72 hour   Reason for Continuation of Hospitalization: Anxiety Depression Suicidal ideation  Estimated Length of Stay:  Last 3 [place] Suicide Severity Risk Score: Flowsheet Row Ad[facility] (Current) [facility] from [facility]/27/2023 in [facility] ED from 09/17/2022 in [facility] Emergency Department at [facility]  C-SSRS RISK CATEGORY Error: Q7 should not be populated when Q6 is No High Risk No Risk       Last PHQ 2/9 Scores:     No data to display          Scribe for Treatment Team: Charise Killian 03/03/2023 2:59 PM

## 2023-03-03 NOTE — Progress Notes (Signed)
   03/03/23 1610  What Happened  Was fall witnessed? Yes  Who witnessed fall? Patient's roommate  Patients activity before fall ambulating-assisted  Point of contact other (comment) (No injury)  Was patient injured? No  Provider Notification  Provider Name/Title Roselyn Bering, NP  Date Provider Notified 03/03/23  Time Provider Notified [phone]  Method of Notification Call  Notification Reason Fall  Provider response No new orders  Date of Provider Response 03/03/23  Time of Provider Response [phone]  Follow Up  Family notified No - patient refusal  Additional tests No  Simple treatment Other (comment)  Progress note created (see row info) Yes  Blank note created Yes  Adult Fall Risk Assessment  Risk Factor Category (scoring not indicated) High fall risk per protocol (document High fall risk)  Patient Fall Risk Level High fall risk  Adult Fall Risk Interventions  Required Bundle Interventions *See Row Information* High fall risk - low, moderate, and high requirements implemented  Additional Interventions Other (Comment) (Patient assessed, Vital signs obtained, wheelchair provided.)  Fall intervention(s) refused/Patient educated regarding refusal Nonskid socks;Yellow bracelet  Screening for Fall Injury Risk (To be completed on HIGH fall risk patients) - Assessing Need for Floor Mats  Risk For Fall Injury- Criteria for Floor Mats Noncompliant with safety precautions;None identified - No additional interventions needed  Vitals  Temp 98 F (36.7 C)  Temp Source Oral  BP (!) 135/94  MAP (mmHg) 108  BP Location Left Arm  BP Method Automatic  Patient Position (if appropriate) Sitting  Pulse Rate 99  Pulse Rate Source Monitor  Resp 18  Oxygen Therapy  SpO2 100 %  O2 Device Room Air  Patient Activity (if Appropriate) In chair (Dayroom in a wheelchair)  Pulse Oximetry Type Intermittent  Pain Assessment  Pain Scale 0-10  Pain Score 0  PAINAD (Pain Assessment in Advanced Dementia)   Breathing 0  Negative Vocalization 0  Facial Expression 0  Body Language 0  Consolability 0  PAINAD Score 0  Neurological  Neuro (WDL) WDL  Level of Consciousness Alert  Musculoskeletal  Musculoskeletal (WDL) X (Weakness. Ambulates using wheelchair)  Assistive Device Wheelchair  Integumentary  Integumentary (WDL) WDL  RN Assisting with Skin Assessment on Admission Marisue Ivan, RN  Skin Color Appropriate for ethnicity  Pain Assessment  Result of Injury No  Pain Assessment  Work-Related Injury No  Pain Screening  Response to Interventions complied  Clinical Progression Not changed

## 2023-03-03 NOTE — Plan of Care (Signed)
  Problem: Education: Goal: Knowledge of [place] [facility] Education information/materials will improve Outcome: Progressing   Problem: Education: Goal: Mental status will improve Outcome: Progressing   Problem: Education: Goal: Verbalization of understanding the information provided will improve Outcome: Progressing   Problem: Activity: Goal: Sleeping patterns will improve Outcome: Progressing   Problem: Health Behavior/Discharge Planning: Goal: Compliance with treatment plan for underlying cause of condition will improve Outcome: Progressing   Problem: Safety: Goal: Periods of time without injury will increase Outcome: Progressing

## 2023-03-03 NOTE — Discharge Summary (Signed)
Physician Discharge Summary Note  Patient:  Nancy [name] is an 37 y.o., female MRN:  [id number] DOB:  August 10, 1985 Patient phone:  [phone] (home)  Patient address:   [address] [place] [address],  Total Time spent with patient: 20 minutes  Date of Admission:  02/28/2023 Date of Discharge: 03/03/2023  Reason for Admission:   Nancy [name] is a 28 year old African-American female who presents due to suicidal ideations.  She reports a plan to overdose on prescription medications.  Reports feeling overwhelmed and under supported by family and friends.  Nancy [name] reports a history of major depressive disorder, generalized anxiety disorder, posttraumatic stress disorder and bipolar disorder.    Nancy [name] reports her most recent stressors is related to her husband.  She reports struggles with substance abuse.  States her husband had requested to take some of her medication due to overusing his medications.  Nancy [name] reports she then went to her mother's house to get to her sister.  She reports her mother was less than supportive when she tried to disclose which she has been dealing with within her marriage. "  My mother has always taken my husband side."  Many reports feeling suicidal after limited support by her mother.  States continues to struggle with grief and loss related to the passing of her father.  States his birthday was a few days ago. "  All of this together because needed to become suicidal."   Principal Problem: Bipolar I disorder, most recent episode depressed [facility]) Discharge Diagnoses: Principal Problem:   Bipolar I disorder, most recent episode depressed (HCC)   Past Psychiatric History: depression, generalized anxiety disorder, posttraumatic stress disorder and bipolar disorder.   Past Medical History:  Past Medical History:  Diagnosis Date   Anxiety    Bipolar disorder (HCC)    CHF (congestive heart failure) (HCC)    CIN I (cervical intraepithelial neoplasia I)     Diabetes mellitus without complication (HCC)    Enlarged heart    Nephrolithiasis    OSA (obstructive sleep apnea)    Tachycardia     Past Surgical History:  Procedure Laterality Date   BLADDER AUGMENTATION  2001   enlargement   DILITATION & CURRETTAGE/HYSTROSCOPY WITH THERMACHOICE ABLATION N/A 07/06/2013   Procedure: DILATATION & CURETTAGE/HYSTEROSCOPY WITH THERMACHOICE ABLATION;  Surgeon: Tilda Burrow, MD;  Location: [facility];  Service: Gynecology;  Laterality: N/A;  total therapt time- 9 minutes 2 seconds; 87C   LAPAROSCOPIC BILATERAL SALPINGECTOMY Bilateral 07/06/2013   Procedure: LAPAROSCOPIC BILATERAL SALPINGECTOMY;  Surgeon: Tilda Burrow, MD;  Location: [facility];  Service: Gynecology;  Laterality: Bilateral;   Family History:  Family History  Problem Relation Age of Onset   Hypertension Mother    Depression Mother    Diabetes Mother    Cancer Mother        brain tumor   Hypertension Father    Diabetes Father    Depression Father    Hypertension Sister    Diabetes Sister    Depression Sister    Depression Brother    Diabetes Brother    Hypertension Brother    Cancer Maternal Aunt    Cancer Paternal Aunt    Hypertension Maternal Grandmother    Diabetes Maternal Grandmother    Depression Maternal Grandmother    Hypertension Maternal Grandfather    Diabetes Maternal Grandfather    Depression Maternal Grandfather    Hypertension Paternal Grandmother    Diabetes Paternal Grandmother    Depression Paternal Grandmother  Hypertension Paternal Grandfather    Diabetes Paternal Grandfather    Depression Paternal Grandfather    Family Psychiatric  History: Reports history of family mental illness. Undiagnosed  Social History:  Social History   Substance and Sexual Activity  Alcohol Use Yes   Comment: occasionally     Social History   Substance and Sexual Activity  Drug Use No    Social History   Socioeconomic History   Marital status: Married    Spouse  name: Not on file   Number of children: Not on file   Years of education: Not on file   Highest education level: Not on file  Occupational History   Not on file  Tobacco Use   Smoking status: Every Day    Current packs/day: 1.50    Types: Cigarettes   Smokeless tobacco: Never  Vaping Use   Vaping status: Never Used  Substance and Sexual Activity   Alcohol use: Yes    Comment: occasionally   Drug use: No   Sexual activity: Not Currently  Other Topics Concern   Not on file  Social History Narrative   Left Handed   Two story home   Drinks caffeine    Social Determinants of Health   Financial Resource Strain: Low Risk  (04/12/2022)   Received from [facility], [facility]   Overall Financial Resource Strain (CARDIA)    Difficulty of Paying Living Expenses: Not hard at all  Food Insecurity: Patient Declined (02/28/2023)   Hunger Vital Sign    Worried About Running Out of Food in the Last Year: Patient declined    Ran Out of Food in the Last Year: Patient declined  Transportation Needs: Patient Declined (02/28/2023)   PRAPARE - Administrator, Civil Service (Medical): Patient declined    Lack of Transportation (Non-Medical): Patient declined  Physical Activity: Inactive (05/07/2021)   Received from [facility], [facility]   Exercise Vital Sign    Days of Exercise per Week: 0 days    Minutes of Exercise per Session: 0 min  Stress: Stress Concern Present (04/12/2022)   Received from [facility], [facility]   [employer] [facility] Occupational Health - Occupational Stress Questionnaire    Feeling of Stress : To some extent  Social Connections: Unknown (12/09/2021)   Received from [facility], [facility]   Social Network    Social Network: Not on file    Hospital Course:   During the patient's hospitalization, patient had extensive initial psychiatric evaluation, and follow-up psychiatric evaluations every day.  Psychiatric diagnoses provided upon  initial assessment: Bipolar Disorder, current depressed, GAD, PTSD.  Patient's psychiatric medications were adjusted on admission: Restarted on her home medications.  During the hospitalization, other adjustments were made to the patient's psychiatric medication regimen: None  Patient's care was discussed during the interdisciplinary team meeting every day during the hospitalization.  The patient is not having side effects to prescribed psychiatric medication.  Gradually, patient started adjusting to milieu. The patient was evaluated each day by a clinical provider to ascertain response to treatment. Improvement was noted by the patient's report of decreasing symptoms, improved sleep and appetite, affect, medication tolerance, behavior, and participation in unit programming.  Patient was asked each day to complete a self inventory noting mood, mental status, pain, new symptoms, anxiety and concerns.   Symptoms were reported as significantly decreased or resolved completely by discharge.  The patient reports that their mood is stable.  The patient denied having  suicidal thoughts for more than 48 hours prior to discharge.  Patient denies having homicidal thoughts.  Patient denies having auditory hallucinations.  Patient denies any visual hallucinations or other symptoms of psychosis.  The patient was motivated to continue taking medication with a goal of continued improvement in mental health.   The patient reports their target psychiatric symptoms of depression and SI responded well to the psychiatric medications, and the patient reports overall benefit other psychiatric hospitalization. Supportive psychotherapy was provided to the patient. The patient also participated in regular group therapy while hospitalized. Coping skills, problem solving as well as relaxation therapies were also part of the unit programming.  Labs were reviewed with the patient, and abnormal results were discussed with the  patient.  The patient is able to verbalize their individual safety plan to this provider.  # It is recommended to the patient to continue psychiatric medications as prescribed, after discharge from the hospital.    # It is recommended to the patient to follow up with your outpatient psychiatric provider and PCP.  # It was discussed with the patient, the impact of alcohol, drugs, tobacco have been there overall psychiatric and medical wellbeing, and total abstinence from substance use was recommended the patient.ed.  # Prescriptions provided or sent directly to preferred pharmacy at discharge. Patient agreeable to plan. Given opportunity to ask questions. Appears to feel comfortable with discharge.    # In the event of worsening symptoms, the patient is instructed to call the crisis hotline, 911 and or go to the nearest ED for appropriate evaluation and treatment of symptoms. To follow-up with primary care provider for other medical issues, concerns and or health care needs  # Patient was discharged to [facility] to get her car and then to her sisters with a plan to follow up as noted below.    On day of discharge she reports that she is better and that if she were to continue to stay here she would worsen again.  She reports that the passive SI reported yesterday was due to her frustration at being here and that she has no SI today.  She reports that she did not hurt herself with her fall and that she is used to this occasionally happening given her chronic back issues.  She reports no side effects to her medications.  She just reports frustration over not being continued on her Adderall while admitted and not being told the reason why.  She reports that she will be going to stay with her sister.  She reports no SI, HI, or AVH.  She reports her sleep was fair last night.  She reports her appetite is good.  She reports no other concerns at present.  She was discharged to [facility] to get her car to go to her  sisters.   Called patient's sister, [name], [phone].  She reports that she has stayed in contact with her sister multiple times while she has been here and she does sound like herself.  She confirms that sister will be staying with her.  She reports no safety concerns.  She reports no other concerns at present.   Physical Findings: AIMS: Facial and Oral Movements Muscles of Facial Expression: None, normal Lips and Perioral Area: None, normal Jaw: None, normal Tongue: None, normal,Extremity Movements Upper (arms, wrists, hands, fingers): None, normal Lower (legs, knees, ankles, toes): None, normal, Trunk Movements Neck, shoulders, hips: None, normal, Overall Severity Severity of abnormal movements (highest score from questions above):  None, normal Incapacitation due to abnormal movements: None, normal Patient's awareness of abnormal movements (rate only patient's report): No Awareness, Dental Status Current problems with teeth and/or dentures?: No Does patient usually wear dentures?: No  CIWA:    COWS:     Musculoskeletal: Strength & Muscle Tone: within normal limits Gait & Station: normal Patient leans: N/A   Psychiatric Specialty Exam:  Presentation  General Appearance:  Appropriate for Environment; Casual  Eye Contact: Good  Speech: Clear and Coherent; Normal Rate  Speech Volume: Normal  Handedness: Right   Mood and Affect  Mood: Dysphoric (frustrated)  Affect: Congruent   Thought Process  Thought Processes: Coherent; Goal Directed  Descriptions of Associations:Intact  Orientation:Full (Time, Place and Person)  Thought Content:Logical; WDL  History of Schizophrenia/Schizoaffective disorder:No  Duration of Psychotic Symptoms:No data recorded Hallucinations:Hallucinations: None  Ideas of Reference:None  Suicidal Thoughts:Suicidal Thoughts: No SI Passive Intent and/or Plan: Without Intent; Without Plan  Homicidal  Thoughts:Homicidal Thoughts: No   Sensorium  Memory: Immediate Fair; Recent Fair  Judgment: Fair  Insight: Fair   Executive Functions  Concentration: Good  Attention Span: Good  Recall: Good  Fund of Knowledge: Good  Language: Good   Psychomotor Activity  Psychomotor Activity: Psychomotor Activity: Normal   Assets  Assets: Communication Skills; Desire for Improvement; Resilience; Social Support; Housing   Sleep  Sleep: Sleep: Fair Number of Hours of Sleep: 5    Physical Exam: Physical Exam Vitals and nursing note reviewed.  Constitutional:      General: She is not in acute distress.    Appearance: Normal appearance. She is obese. She is not ill-appearing or toxic-appearing.  Pulmonary:     Effort: Pulmonary effort is normal.  Neurological:     Mental Status: She is alert.    Review of Systems  Respiratory:  Negative for cough and shortness of breath.   Cardiovascular:  Negative for chest pain.  Gastrointestinal:  Negative for abdominal pain, constipation, diarrhea, nausea and vomiting.  Neurological:  Negative for dizziness, weakness and headaches.  Psychiatric/Behavioral:  Negative for depression, hallucinations and suicidal ideas. The patient is not nervous/anxious.    Blood pressure (!) 142/108, pulse (!) 112, temperature 98 F (36.7 C), temperature source Oral, resp. rate 18, height 5\' 7"  (1.702 m), weight (!) 144.7 kg, SpO2 100%. Body mass index is 49.96 kg/m.   Social History   Tobacco Use  Smoking Status Every Day   Current packs/day: 1.50   Types: Cigarettes  Smokeless Tobacco Never   Tobacco Cessation:  A prescription for an FDA-approved tobacco cessation medication provided at discharge   Blood Alcohol level:  Lab Results  Component Value Date   [facility] <10 02/27/2023   ETH <10 03/12/2020    Metabolic Disorder Labs:  Lab Results  Component Value Date   HGBA1C 11.8 (H) 10/02/2020   MPG 291.96 10/02/2020   MPG 372.32  04/02/2020   Lab Results  Component Value Date   PROLACTIN 12.7 08/29/2016   Lab Results  Component Value Date   CHOL 116 10/03/2020   TRIG 511 (H) 10/03/2020   HDL 20 (L) 10/03/2020   CHOLHDL 5.8 10/03/2020   VLDL UNABLE TO CALCULATE IF TRIGLYCERIDE OVER 400 mg/dL 95/28/4132   LDLCALC UNABLE TO CALCULATE IF TRIGLYCERIDE OVER 400 mg/dL 44/07/270   LDLCALC 89 08/29/2016    See Psychiatric Specialty Exam and Suicide Risk Assessment completed by Attending Physician prior to discharge.  Discharge destination:  Home  Is patient on multiple antipsychotic therapies at discharge:  No   Has Patient had three or more failed trials of antipsychotic monotherapy by history:  No  Recommended Plan for Multiple Antipsychotic Therapies: NA  Discharge Instructions     Diet - low sodium heart healthy   Complete by: As directed    Increase activity slowly   Complete by: As directed       Allergies as of 03/03/2023       Reactions   Asa Buff (mag [buffered Aspirin] Anaphylaxis, Shortness Of Breath, Swelling, Other (See Comments)   "Made my throat become swollen to the point of closing"   Aspirin Anaphylaxis, Shortness Of Breath, Swelling, Other (See Comments)   "Made my throat become swollen to the point of closing"   Tolmetin Shortness Of Breath, Other (See Comments)   TOLECTIN (tolmetin sodium) is indicated for the relief of signs and symptoms of rheumatoid arthritis and osteoarthritis   Prozac [fluoxetine Hcl] Other (See Comments)   "Made depression worse"        Medication List     TAKE these medications      Indication  amphetamine-dextroamphetamine 20 MG 24 hr capsule Commonly known as: ADDERALL XR Take 20 mg by mouth daily.  Indication: Attention Deficit Hyperactivity Disorder   cyclobenzaprine 10 MG tablet Commonly known as: FLEXERIL Take 10 mg by mouth 3 (three) times daily as needed for muscle spasms.  Indication: Muscle Spasm   desvenlafaxine 50 MG 24 hr  tablet Commonly known as: PRISTIQ Take 50 mg by mouth at bedtime.  Indication: Major Depressive Disorder   EPINEPHrine 0.3 mg/0.3 mL Soaj injection Commonly known as: EPI-PEN Inject 0.3 mg into the muscle as needed for anaphylaxis.  Indication: Life-Threatening Hypersensitivity Reaction   famotidine 40 MG tablet Commonly known as: PEPCID Take 40 mg by mouth every evening.  Indication: Heartburn   lisinopril 5 MG tablet Commonly known as: ZESTRIL Take 2.5 mg by mouth daily.  Indication: High Blood Pressure Disorder   LORazepam 1 MG tablet Commonly known as: ATIVAN Take 1 mg by mouth 2 (two) times daily as needed for anxiety.  Indication: Feeling Anxious   lurasidone 40 MG Tabs tablet Commonly known as: LATUDA Take 1 tablet (40 mg total) by mouth daily with breakfast. Start taking on: March 04, 2023  Indication: Depressive Phase of Manic-Depression   nicotine 21 mg/24hr patch Commonly known as: NICODERM CQ - dosed in mg/24 hours Place 1 patch (21 mg total) onto the skin daily. Start taking on: March 04, 2023  Indication: Nicotine Addiction   oxyCODONE 15 MG immediate release tablet Commonly known as: ROXICODONE Take 15 mg by mouth every 4 (four) hours as needed for pain.  Indication: Chronic Pain   Ozempic (0.25 or 0.5 MG/DOSE) 2 MG/3ML Sopn Generic drug: Semaglutide(0.25 or 0.5MG /DOS) Inject 1.5 mg into the skin once a week. Wednesday  Indication: Type 2 Diabetes   pravastatin 20 MG tablet Commonly known as: PRAVACHOL Take 20 mg by mouth every evening.  Indication: High Amount of Fats in the Blood   pregabalin 300 MG capsule Commonly known as: LYRICA Take 300 mg by mouth 2 (two) times daily.  Indication: Diabetes with Nerve Disease   zolpidem 5 MG tablet Commonly known as: AMBIEN Take 1 tablet (5 mg total) by mouth at bedtime as needed for sleep.  Indication: Trouble Sleeping        Follow-up Information     Beavers, Leotis Shames, NP. Go on 04/01/2023.    Specialty: Nurse Practitioner Why: You have an appointment for medication  management services on 04/01/23 at 8:30 am, in person. Contact information: [address] [place] [address] [phone]         BEHAVIORAL HEALTH PARTIAL HOSPITALIZATION PROGRAM Follow up on 03/07/2023.   Specialty: Behavioral Health Why: You are scheduled for an assessment for the Partial Hospitalization Program on 03/07/23 at 10:00 am. This appointment will last approximately one hour and will be virtual via Teams.  You will receive an email invite the day of the appointment. PHP is virtual group therapy that runs Mon-Fri from 9am-1pm. Please download the [employer] Teams app prior to the appointment. If you need to cancel or reschedule, please call [phone] Contact information: [address] [place] [address] [phone]                Follow-up recommendations/Comments:    Activity: as tolerated   Diet: heart healthy   Other: -Follow-up with your outpatient psychiatric provider -instructions on appointment date, time, and address (location) are provided to you in discharge paperwork.   -Take your psychiatric medications as prescribed at discharge - instructions are provided to you in the discharge paperwork   -Follow-up with outpatient primary care doctor and other specialists -for management of chronic medical disease, including: Continued management for your chronic pain and diabetes.   -Testing: Follow-up with outpatient provider for abnormal lab results: None   -Recommend abstinence from alcohol, tobacco, and other illicit drug use at discharge.    -If your psychiatric symptoms recur, worsen, or if you have side effects to your psychiatric medications, call your outpatient psychiatric provider, 911, [phone] or go to the nearest emergency department.   -If suicidal thoughts recur, call your outpatient psychiatric provider, 911, [phone] or go to the nearest  emergency department.   Signed: Lauro Franklin, MD 03/03/2023, 12:13 PM

## 2023-03-03 NOTE — Progress Notes (Signed)
This morning at approximately 8 am pt was advised and reminded by myself to call for assistance and use call bell when getting out of bed and transferring to wheelchair. At approximately 10:10 am I was alerted by MHT Arvella Nigh that pt experienced an unwitnessed fall at approximately 10 am. Marjean Donna, MHT obtained vitals for pt. Pt states fall  occurred as she was transferring from her bed to her wheelchair.  Pt states she landed on her bottom but also hit her forehead on the right side. Pt denies any headache or pain other than pain in her legs that was present prior to the fall. Leg pain bilateral is 9/10. Pt refuses to go to the hospital. Sarita Bottom, MD notified at this time. No new orders. Pt is placed on a 1:1 continuous monitoring. No new marks noted on pt. Pt was found on the floor by Willodean Rosenthal, MHT. Pt wearing nonskid socks at time of fall.

## 2023-03-04 ENCOUNTER — Telehealth (HOSPITAL_COMMUNITY): Payer: Self-pay | Admitting: Licensed Clinical Social Worker

## 2023-03-07 ENCOUNTER — Other Ambulatory Visit (HOSPITAL_COMMUNITY)

## 2023-10-02 ENCOUNTER — Other Ambulatory Visit: Payer: Self-pay

## 2023-10-02 DIAGNOSIS — E119 Type 2 diabetes mellitus without complications: Secondary | ICD-10-CM

## 2023-10-16 ENCOUNTER — Other Ambulatory Visit

## 2023-10-17 LAB — COMPLETE METABOLIC PANEL WITH GFR
AG Ratio: 1.3 (calc) (ref 1.0–2.5)
ALT: 12 U/L (ref 6–29)
AST: 17 U/L (ref 10–30)
Albumin: 3.9 g/dL (ref 3.6–5.1)
Alkaline phosphatase (APISO): 65 U/L (ref 31–125)
BUN: 8 mg/dL (ref 7–25)
CO2: 25 mmol/L (ref 20–32)
Calcium: 8.8 mg/dL (ref 8.6–10.2)
Chloride: 109 mmol/L (ref 98–110)
Creat: 0.61 mg/dL (ref 0.50–0.97)
Globulin: 3 g/dL (ref 1.9–3.7)
Glucose, Bld: 150 mg/dL — ABNORMAL HIGH (ref 65–139)
Potassium: 3.6 mmol/L (ref 3.5–5.3)
Sodium: 143 mmol/L (ref 135–146)
Total Bilirubin: 0.4 mg/dL (ref 0.2–1.2)
Total Protein: 6.9 g/dL (ref 6.1–8.1)

## 2023-10-17 LAB — LIPID PANEL
Cholesterol: 135 mg/dL (ref <200)
HDL: 36 mg/dL — ABNORMAL LOW (ref >=50)
LDL Cholesterol (Calc): 74 mg/dL
Non-HDL Cholesterol (Calc): 99 mg/dL (ref <130)
Total CHOL/HDL Ratio: 3.8 (calc) (ref <5.0)
Triglycerides: 177 mg/dL — ABNORMAL HIGH (ref <150)

## 2023-10-17 LAB — C-PEPTIDE: C-Peptide: 5.59 ng/mL — ABNORMAL HIGH (ref 0.80–3.85)

## 2023-10-17 LAB — HEMOGLOBIN A1C
Hgb A1c MFr Bld: 6.2 %{Hb} — ABNORMAL HIGH (ref <5.7)
Mean Plasma Glucose: 131 mg/dL
eAG (mmol/L): 7.3 mmol/L

## 2023-10-17 LAB — MICROALBUMIN / CREATININE URINE RATIO
Creatinine, Urine: 434 mg/dL — ABNORMAL HIGH (ref 20–275)
Microalb Creat Ratio: 4 mg/g{creat} (ref <30)
Microalb, Ur: 1.6 mg/dL

## 2023-10-21 ENCOUNTER — Ambulatory Visit: Admitting: "Endocrinology

## 2024-07-23 ENCOUNTER — Other Ambulatory Visit: Payer: Self-pay
